# Patient Record
Sex: Male | Born: 1965 | Race: White | Hispanic: No | Marital: Married | Smoking: Former smoker
Health system: Southern US, Community
[De-identification: ages and names within clinical notes are randomized; demographics above are authoritative.]

## PROBLEM LIST (undated history)

## (undated) DIAGNOSIS — I509 Heart failure, unspecified: Secondary | ICD-10-CM

## (undated) DIAGNOSIS — I10 Essential (primary) hypertension: Secondary | ICD-10-CM

## (undated) DIAGNOSIS — G4700 Insomnia, unspecified: Secondary | ICD-10-CM

## (undated) DIAGNOSIS — I429 Cardiomyopathy, unspecified: Secondary | ICD-10-CM

## (undated) DIAGNOSIS — E119 Type 2 diabetes mellitus without complications: Secondary | ICD-10-CM

## (undated) DIAGNOSIS — E7800 Pure hypercholesterolemia, unspecified: Secondary | ICD-10-CM

## (undated) DIAGNOSIS — F329 Major depressive disorder, single episode, unspecified: Secondary | ICD-10-CM

## (undated) DIAGNOSIS — I2510 Atherosclerotic heart disease of native coronary artery without angina pectoris: Secondary | ICD-10-CM

## (undated) DIAGNOSIS — E785 Hyperlipidemia, unspecified: Secondary | ICD-10-CM

## (undated) DIAGNOSIS — I219 Acute myocardial infarction, unspecified: Secondary | ICD-10-CM

## (undated) DIAGNOSIS — F419 Anxiety disorder, unspecified: Secondary | ICD-10-CM

## (undated) DIAGNOSIS — G629 Polyneuropathy, unspecified: Secondary | ICD-10-CM

## (undated) DIAGNOSIS — J449 Chronic obstructive pulmonary disease, unspecified: Secondary | ICD-10-CM

## (undated) DIAGNOSIS — K219 Gastro-esophageal reflux disease without esophagitis: Secondary | ICD-10-CM

## (undated) DIAGNOSIS — F32A Depression, unspecified: Secondary | ICD-10-CM

## (undated) DIAGNOSIS — I4891 Unspecified atrial fibrillation: Secondary | ICD-10-CM

## (undated) HISTORY — DX: Insomnia, unspecified: G47.00

## (undated) HISTORY — PX: NO PAST SURGERIES: SHX2092

## (undated) HISTORY — DX: Depression, unspecified: F32.A

## (undated) HISTORY — DX: Pure hypercholesterolemia, unspecified: E78.00

## (undated) HISTORY — DX: Chronic obstructive pulmonary disease, unspecified: J44.9

## (undated) HISTORY — DX: Anxiety disorder, unspecified: F41.9

## (undated) HISTORY — DX: Polyneuropathy, unspecified: G62.9

## (undated) HISTORY — DX: Major depressive disorder, single episode, unspecified: F32.9

---

## 2007-10-05 ENCOUNTER — Ambulatory Visit: Payer: Self-pay | Admitting: Cardiology

## 2011-08-31 ENCOUNTER — Emergency Department (HOSPITAL_COMMUNITY): Payer: Self-pay

## 2011-08-31 ENCOUNTER — Emergency Department (HOSPITAL_COMMUNITY)
Admission: EM | Admit: 2011-08-31 | Discharge: 2011-08-31 | Disposition: A | Discharge status: Home / Self Care | Payer: Self-pay | Attending: Emergency Medicine | Admitting: Emergency Medicine

## 2011-08-31 ENCOUNTER — Encounter (HOSPITAL_COMMUNITY): Payer: Self-pay

## 2011-08-31 DIAGNOSIS — S93401A Sprain of unspecified ligament of right ankle, initial encounter: Secondary | ICD-10-CM

## 2011-08-31 DIAGNOSIS — S93409A Sprain of unspecified ligament of unspecified ankle, initial encounter: Secondary | ICD-10-CM | POA: Insufficient documentation

## 2011-08-31 DIAGNOSIS — W172XXA Fall into hole, initial encounter: Secondary | ICD-10-CM | POA: Insufficient documentation

## 2011-08-31 DIAGNOSIS — M7989 Other specified soft tissue disorders: Secondary | ICD-10-CM | POA: Insufficient documentation

## 2011-08-31 HISTORY — DX: Essential (primary) hypertension: I10

## 2011-08-31 MED ORDER — IBUPROFEN 800 MG PO TABS
800.0000 mg | ORAL_TABLET | Freq: Once | ORAL | Status: AC
  Administered 2011-08-31: 800 mg via ORAL
  Filled 2011-08-31: qty 1

## 2011-08-31 MED ORDER — HYDROCODONE-ACETAMINOPHEN 5-325 MG PO TABS
1.0000 | ORAL_TABLET | Freq: Once | ORAL | Status: AC
  Administered 2011-08-31: 1 via ORAL
  Filled 2011-08-31: qty 1

## 2011-08-31 MED ORDER — HYDROCODONE-ACETAMINOPHEN 5-325 MG PO TABS: 1.0000 | ORAL_TABLET | Freq: Four times a day (QID) | ORAL | Status: AC | PRN

## 2011-08-31 NOTE — ED Notes (Signed)
Pt reports stepped in hole this morning and c/o r ankle pain.  Swelling noted.

## 2011-08-31 NOTE — ED Provider Notes (Signed)
History     CSN: [id number]  Arrival date & time 08/31/11  1247   First MD Initiated Contact with Patient 08/31/11 1309      Chief Complaint  Patient presents with  . Ankle Pain    (Consider location/radiation/quality/duration/timing/severity/associated sxs/prior treatment) HPI Comments: Pt stepped in a hole and inverted R foot and ankle.  No other injuries or complaints.  Patient is a 46 y.o. male presenting with ankle pain. The history is provided by the patient. No language interpreter was used.  Ankle Pain  The incident occurred 1 to 2 hours ago. The incident occurred at home. The injury mechanism was a fall. The pain is present in the right ankle. The quality of the pain is described as throbbing. The pain is at a severity of 7/10. The pain has been constant since onset. Associated symptoms include inability to bear weight and loss of motion. Pertinent negatives include no numbness, no muscle weakness, no loss of sensation and no tingling. He reports no foreign bodies present. The symptoms are aggravated by bearing weight and palpation. He has tried nothing for the symptoms.    Past Medical History  Diagnosis Date  . Diabetes mellitus   . Hypertension     History reviewed. No pertinent past surgical history.  No family history on file.  History  Substance Use Topics  . Smoking status: Never Smoker   . Smokeless tobacco: Not on file  . Alcohol Use: No      Review of Systems  Skin: Negative for wound.  Neurological: Negative for tingling, weakness and numbness.  All other systems reviewed and are negative.    Allergies  Review of patient's allergies indicates no known allergies.  Home Medications   Current Outpatient Rx  Name Route Sig Dispense Refill  . HYDROCODONE-ACETAMINOPHEN 5-325 MG PO TABS Oral Take 1 tablet by mouth every 6 (six) hours as needed for pain. 20 tablet 0    BP 147/96  Pulse 93  Temp 98.9 F (37.2 C) (Oral)  Resp 20  Ht 5\' 9"   (1.753 m)  Wt 230 lb (104.327 kg)  BMI 33.96 kg/m2  SpO2 97%  Physical Exam  Nursing note and vitals reviewed. Constitutional: He is oriented to person, place, and time. He appears well-developed and well-nourished.  HENT:  Head: Normocephalic and atraumatic.  Eyes: EOM are normal.  Neck: Normal range of motion.  Cardiovascular: Normal rate, regular rhythm, normal heart sounds and intact distal pulses.   Pulmonary/Chest: Effort normal and breath sounds normal. No respiratory distress.  Abdominal: Soft. He exhibits no distension. There is no tenderness.  Musculoskeletal: He exhibits tenderness.       Right ankle: He exhibits decreased range of motion and swelling. He exhibits no ecchymosis, no deformity, no laceration and normal pulse. tenderness. Lateral malleolus tenderness found.  Neurological: He is alert and oriented to person, place, and time.  Skin: Skin is warm and dry.  Psychiatric: He has a normal mood and affect. Judgment normal.    ED Course  Procedures (including critical care time)  Labs Reviewed - No data to display Dg Ankle Complete Right  08/31/2011  *RADIOLOGY REPORT*  Clinical Data: ankle injury.  Ankle pain and swelling.  RIGHT ANKLE - COMPLETE 3+ VIEW  Comparison: None.  Findings: Mild lateral soft tissue swelling is seen.   No evidence of for acute fracture or dislocation.  An ankle joint effusion is seen.  No other significant bone abnormality identified.  IMPRESSION: Mild lateral soft tissue  swelling and ankle joint effusion.  No evidence of fracture or dislocation.  Original Report Authenticated By: Danae Orleans, M.D.     1. Right ankle sprain       MDM  ASO, crutches, ice and elevation. rx-hydrocodone, 20 Ibuprofen 800 mg TID F/u with dr. Stana Bunting, PA 08/31/11 1347

## 2011-08-31 NOTE — ED Notes (Signed)
Patient with no complaints at this time. Respirations even and unlabored. Skin warm/dry. Discharge instructions reviewed with patient at this time. Patient given opportunity to voice concerns/ask questions. Patient discharged at this time and left Emergency Department via wheelchair. 

## 2011-08-31 NOTE — ED Provider Notes (Signed)
Medical screening examination/treatment/procedure(s) were performed by non-physician practitioner and as supervising physician I was immediately available for consultation/collaboration.   Angeletta Goelz L Davionna Blacksher, MD 08/31/11 1512 

## 2013-11-04 ENCOUNTER — Encounter (HOSPITAL_COMMUNITY)
Admission: RE | Disposition: A | Discharge status: Home / Self Care | Payer: Self-pay | Source: Other Acute Inpatient Hospital | Attending: Thoracic Surgery (Cardiothoracic Vascular Surgery)

## 2013-11-04 ENCOUNTER — Encounter (HOSPITAL_COMMUNITY): Payer: Self-pay | Admitting: Cardiology

## 2013-11-04 ENCOUNTER — Inpatient Hospital Stay (HOSPITAL_COMMUNITY)
Admission: RE | Admit: 2013-11-04 | Discharge: 2013-11-15 | DRG: 234 | Disposition: A | Discharge status: Home / Self Care | Payer: Medicaid Other | Source: Other Acute Inpatient Hospital | Attending: Thoracic Surgery (Cardiothoracic Vascular Surgery) | Admitting: Thoracic Surgery (Cardiothoracic Vascular Surgery)

## 2013-11-04 DIAGNOSIS — F1410 Cocaine abuse, uncomplicated: Secondary | ICD-10-CM

## 2013-11-04 DIAGNOSIS — Z8249 Family history of ischemic heart disease and other diseases of the circulatory system: Secondary | ICD-10-CM

## 2013-11-04 DIAGNOSIS — I219 Acute myocardial infarction, unspecified: Secondary | ICD-10-CM

## 2013-11-04 DIAGNOSIS — E785 Hyperlipidemia, unspecified: Secondary | ICD-10-CM

## 2013-11-04 DIAGNOSIS — F17200 Nicotine dependence, unspecified, uncomplicated: Secondary | ICD-10-CM | POA: Diagnosis present

## 2013-11-04 DIAGNOSIS — I2510 Atherosclerotic heart disease of native coronary artery without angina pectoris: Secondary | ICD-10-CM | POA: Diagnosis present

## 2013-11-04 DIAGNOSIS — Y9210 Unspecified residential institution as the place of occurrence of the external cause: Secondary | ICD-10-CM | POA: Diagnosis not present

## 2013-11-04 DIAGNOSIS — D62 Acute posthemorrhagic anemia: Secondary | ICD-10-CM | POA: Diagnosis not present

## 2013-11-04 DIAGNOSIS — Z79899 Other long term (current) drug therapy: Secondary | ICD-10-CM

## 2013-11-04 DIAGNOSIS — I2109 ST elevation (STEMI) myocardial infarction involving other coronary artery of anterior wall: Principal | ICD-10-CM | POA: Diagnosis present

## 2013-11-04 DIAGNOSIS — E669 Obesity, unspecified: Secondary | ICD-10-CM

## 2013-11-04 DIAGNOSIS — N189 Chronic kidney disease, unspecified: Secondary | ICD-10-CM | POA: Diagnosis present

## 2013-11-04 DIAGNOSIS — IMO0001 Reserved for inherently not codable concepts without codable children: Secondary | ICD-10-CM | POA: Diagnosis present

## 2013-11-04 DIAGNOSIS — I10 Essential (primary) hypertension: Secondary | ICD-10-CM

## 2013-11-04 DIAGNOSIS — I129 Hypertensive chronic kidney disease with stage 1 through stage 4 chronic kidney disease, or unspecified chronic kidney disease: Secondary | ICD-10-CM | POA: Diagnosis present

## 2013-11-04 DIAGNOSIS — Z9119 Patient's noncompliance with other medical treatment and regimen: Secondary | ICD-10-CM

## 2013-11-04 DIAGNOSIS — E1159 Type 2 diabetes mellitus with other circulatory complications: Secondary | ICD-10-CM

## 2013-11-04 DIAGNOSIS — I2584 Coronary atherosclerosis due to calcified coronary lesion: Secondary | ICD-10-CM | POA: Diagnosis present

## 2013-11-04 DIAGNOSIS — Z833 Family history of diabetes mellitus: Secondary | ICD-10-CM

## 2013-11-04 DIAGNOSIS — Z91199 Patient's noncompliance with other medical treatment and regimen due to unspecified reason: Secondary | ICD-10-CM | POA: Diagnosis not present

## 2013-11-04 DIAGNOSIS — E8779 Other fluid overload: Secondary | ICD-10-CM | POA: Diagnosis not present

## 2013-11-04 DIAGNOSIS — I519 Heart disease, unspecified: Secondary | ICD-10-CM | POA: Diagnosis not present

## 2013-11-04 DIAGNOSIS — Y832 Surgical operation with anastomosis, bypass or graft as the cause of abnormal reaction of the patient, or of later complication, without mention of misadventure at the time of the procedure: Secondary | ICD-10-CM | POA: Diagnosis not present

## 2013-11-04 DIAGNOSIS — I4891 Unspecified atrial fibrillation: Secondary | ICD-10-CM | POA: Diagnosis not present

## 2013-11-04 DIAGNOSIS — Z951 Presence of aortocoronary bypass graft: Secondary | ICD-10-CM

## 2013-11-04 DIAGNOSIS — J9819 Other pulmonary collapse: Secondary | ICD-10-CM | POA: Diagnosis present

## 2013-11-04 DIAGNOSIS — E1165 Type 2 diabetes mellitus with hyperglycemia: Secondary | ICD-10-CM

## 2013-11-04 DIAGNOSIS — I4892 Unspecified atrial flutter: Secondary | ICD-10-CM | POA: Diagnosis present

## 2013-11-04 DIAGNOSIS — I25119 Atherosclerotic heart disease of native coronary artery with unspecified angina pectoris: Secondary | ICD-10-CM

## 2013-11-04 DIAGNOSIS — K219 Gastro-esophageal reflux disease without esophagitis: Secondary | ICD-10-CM | POA: Diagnosis present

## 2013-11-04 DIAGNOSIS — Z7982 Long term (current) use of aspirin: Secondary | ICD-10-CM

## 2013-11-04 DIAGNOSIS — I213 ST elevation (STEMI) myocardial infarction of unspecified site: Secondary | ICD-10-CM

## 2013-11-04 DIAGNOSIS — I498 Other specified cardiac arrhythmias: Secondary | ICD-10-CM | POA: Diagnosis present

## 2013-11-04 DIAGNOSIS — I25110 Atherosclerotic heart disease of native coronary artery with unstable angina pectoris: Secondary | ICD-10-CM

## 2013-11-04 DIAGNOSIS — E119 Type 2 diabetes mellitus without complications: Secondary | ICD-10-CM

## 2013-11-04 HISTORY — DX: Acute myocardial infarction, unspecified: I21.9

## 2013-11-04 HISTORY — PX: LEFT HEART CATHETERIZATION WITH CORONARY ANGIOGRAM: SHX5451

## 2013-11-04 HISTORY — DX: Gastro-esophageal reflux disease without esophagitis: K21.9

## 2013-11-04 LAB — SURGICAL PCR SCREEN
MRSA, PCR: NEGATIVE
STAPHYLOCOCCUS AUREUS: NEGATIVE

## 2013-11-04 LAB — GLUCOSE, CAPILLARY
GLUCOSE-CAPILLARY: 342 mg/dL — AB (ref 70–99)
Glucose-Capillary: 352 mg/dL — ABNORMAL HIGH (ref 70–99)

## 2013-11-04 LAB — POCT ACTIVATED CLOTTING TIME: Activated Clotting Time: 118 seconds

## 2013-11-04 SURGERY — LEFT HEART CATHETERIZATION WITH CORONARY ANGIOGRAM
Anesthesia: LOCAL

## 2013-11-04 MED ORDER — INSULIN ASPART 100 UNIT/ML ~~LOC~~ SOLN
0.0000 [IU] | Freq: Every day | SUBCUTANEOUS | Status: DC
  Administered 2013-11-04 – 2013-11-05 (×2): 5 [IU] via SUBCUTANEOUS
  Administered 2013-11-07: 2 [IU] via SUBCUTANEOUS

## 2013-11-04 MED ORDER — FENTANYL CITRATE 0.05 MG/ML IJ SOLN
INTRAMUSCULAR | Status: AC
  Filled 2013-11-04: qty 2

## 2013-11-04 MED ORDER — ACETAMINOPHEN 325 MG PO TABS
650.0000 mg | ORAL_TABLET | ORAL | Status: DC | PRN
Start: 2013-11-04 — End: 2013-11-09
  Administered 2013-11-04 – 2013-11-05 (×2): 650 mg via ORAL
  Filled 2013-11-04 (×2): qty 2

## 2013-11-04 MED ORDER — NITROGLYCERIN IN D5W 200-5 MCG/ML-% IV SOLN
INTRAVENOUS | Status: AC
Start: 2013-11-04 — End: 2013-11-04
  Filled 2013-11-04: qty 250

## 2013-11-04 MED ORDER — HEPARIN SODIUM (PORCINE) 1000 UNIT/ML IJ SOLN
INTRAMUSCULAR | Status: AC
  Filled 2013-11-04: qty 1

## 2013-11-04 MED ORDER — AMLODIPINE BESYLATE 5 MG PO TABS
5.0000 mg | ORAL_TABLET | Freq: Every day | ORAL | Status: DC
  Administered 2013-11-04 – 2013-11-08 (×5): 5 mg via ORAL
  Filled 2013-11-04 (×6): qty 1

## 2013-11-04 MED ORDER — ONDANSETRON HCL 4 MG/2ML IJ SOLN: 4.0000 mg | Freq: Four times a day (QID) | INTRAMUSCULAR | Status: DC | PRN

## 2013-11-04 MED ORDER — CETYLPYRIDINIUM CHLORIDE 0.05 % MT LIQD
7.0000 mL | Freq: Two times a day (BID) | OROMUCOSAL | Status: DC
  Administered 2013-11-05 – 2013-11-08 (×7): 7 mL via OROMUCOSAL

## 2013-11-04 MED ORDER — MIDAZOLAM HCL 2 MG/2ML IJ SOLN
INTRAMUSCULAR | Status: AC
  Filled 2013-11-04: qty 2

## 2013-11-04 MED ORDER — ASPIRIN EC 325 MG PO TBEC
325.0000 mg | DELAYED_RELEASE_TABLET | Freq: Every day | ORAL | Status: DC
  Administered 2013-11-04 – 2013-11-08 (×5): 325 mg via ORAL
  Filled 2013-11-04 (×6): qty 1

## 2013-11-04 MED ORDER — NITROGLYCERIN IN D5W 200-5 MCG/ML-% IV SOLN
2.0000 ug/min | INTRAVENOUS | Status: DC
  Administered 2013-11-05 (×2): 80 ug/min via INTRAVENOUS
  Administered 2013-11-06: 10 ug/min via INTRAVENOUS
  Filled 2013-11-04 (×3): qty 250

## 2013-11-04 MED ORDER — NITROGLYCERIN 1 MG/10 ML FOR IR/CATH LAB
INTRA_ARTERIAL | Status: AC
  Filled 2013-11-04: qty 10

## 2013-11-04 MED ORDER — SODIUM CHLORIDE 0.9 % IV SOLN
INTRAVENOUS | Status: DC | PRN
  Administered 2013-11-04 – 2013-11-07 (×2): 1000 mL via INTRAVENOUS

## 2013-11-04 MED ORDER — HEPARIN (PORCINE) IN NACL 2-0.9 UNIT/ML-% IJ SOLN
INTRAMUSCULAR | Status: AC
  Filled 2013-11-04: qty 1000

## 2013-11-04 MED ORDER — ATORVASTATIN CALCIUM 40 MG PO TABS
40.0000 mg | ORAL_TABLET | Freq: Every day | ORAL | Status: DC
  Administered 2013-11-04 – 2013-11-08 (×5): 40 mg via ORAL
  Filled 2013-11-04 (×6): qty 1

## 2013-11-04 MED ORDER — LIDOCAINE HCL (PF) 1 % IJ SOLN
INTRAMUSCULAR | Status: AC
  Filled 2013-11-04: qty 30

## 2013-11-04 MED ORDER — VERAPAMIL HCL 2.5 MG/ML IV SOLN
INTRAVENOUS | Status: AC
  Filled 2013-11-04: qty 2

## 2013-11-04 MED ORDER — HYDRALAZINE HCL 20 MG/ML IJ SOLN
10.0000 mg | Freq: Once | INTRAMUSCULAR | Status: AC
  Administered 2013-11-04: 10 mg via INTRAVENOUS
  Filled 2013-11-04: qty 1

## 2013-11-04 MED ORDER — OXYCODONE-ACETAMINOPHEN 5-325 MG PO TABS
1.0000 | ORAL_TABLET | Freq: Four times a day (QID) | ORAL | Status: DC | PRN
  Administered 2013-11-04 – 2013-11-08 (×5): 1 via ORAL
  Filled 2013-11-04 (×9): qty 1

## 2013-11-04 MED ORDER — PANTOPRAZOLE SODIUM 20 MG PO TBEC
20.0000 mg | DELAYED_RELEASE_TABLET | Freq: Every day | ORAL | Status: DC
  Administered 2013-11-05 – 2013-11-08 (×4): 20 mg via ORAL
  Filled 2013-11-04 (×5): qty 1

## 2013-11-04 MED ORDER — HEPARIN (PORCINE) IN NACL 100-0.45 UNIT/ML-% IJ SOLN
2000.0000 [IU]/h | INTRAMUSCULAR | Status: DC
  Administered 2013-11-05: 1600 [IU]/h via INTRAVENOUS
  Administered 2013-11-05: 1300 [IU]/h via INTRAVENOUS
  Administered 2013-11-06: 1800 [IU]/h via INTRAVENOUS
  Administered 2013-11-06 – 2013-11-08 (×6): 2000 [IU]/h via INTRAVENOUS
  Filled 2013-11-04 (×14): qty 250

## 2013-11-04 MED ORDER — HYDRALAZINE HCL 20 MG/ML IJ SOLN
10.0000 mg | INTRAMUSCULAR | Status: DC | PRN
  Administered 2013-11-05 (×2): 10 mg via INTRAVENOUS
  Filled 2013-11-04 (×2): qty 1

## 2013-11-04 MED ORDER — INSULIN ASPART 100 UNIT/ML ~~LOC~~ SOLN
0.0000 [IU] | Freq: Three times a day (TID) | SUBCUTANEOUS | Status: DC
  Administered 2013-11-05 (×2): 8 [IU] via SUBCUTANEOUS
  Administered 2013-11-05 – 2013-11-06 (×2): 11 [IU] via SUBCUTANEOUS

## 2013-11-04 NOTE — H&P (Addendum)
Chief Complaint: Chest Pain; STEMI Primary Cardiologist: Claiborne Billings ([place])  HPI: The patient is a 48 y/o male with a history of HTN, DM, HLD, obesity, medical noncompliance, polysubstance abuse including tobacco abuse (1ppd) and recent cocaine use, who presents to [facility] via EMS from [facility] for treatment for STEMI. Pt reports using cocaine 1 day ago. He developed sudden onset of SSCP today around 5am. Occurred at rest and was constant throughout the day. Described as heavy pressure. Non radiating. No other associated symptoms.   On arrival to [facility], EKG demonstrated ST elevations. Troponin returned positive at 2.03. SBP was elevated in the 180s-190s. UDS tested positive for cocaine. SCr 0.74.   He was given 4,000 units of heparin followed by a drip, 81 mg of ASA, SL NTG x 3, 10 mg IV morphine and 5 mg IV metoprolol. On arrival to [facility], he was A&OX3 with ongoing 4/10 chest pain.     Past Medical History  Diagnosis Date  . Diabetes mellitus   . Hypertension     No past surgical history on file.  Family History  Problem Relation Age of Onset  . Hypertension Paternal Grandfather    Social History:  reports that he has been smoking Cigarettes.  He has been smoking about 1.00 pack per day. He does not have any smokeless tobacco history on file. He reports that he uses illicit drugs. He reports that he does not drink alcohol.  Allergies: No Known Allergies  Prior to Admission medications   Not on File     No prescriptions prior to admission    No results found for this or any previous visit (from the past 48 hour(s)). No results found.  Review of Systems  Respiratory: Negative for shortness of breath.   Cardiovascular: Positive for chest pain.  Gastrointestinal: Negative for nausea and vomiting.  Neurological: Negative for dizziness and loss of consciousness.  All other systems reviewed and are negative.   There were no vitals taken for this visit. Physical Exam    Constitutional: He is oriented to person, place, and time. He appears well-developed and well-nourished. No distress.  Neck: No JVD present.  Cardiovascular: Normal rate, regular rhythm, normal heart sounds and intact distal pulses.  Exam reveals no gallop and no friction rub.   No murmur heard. Respiratory: Effort normal and breath sounds normal.  Musculoskeletal: He exhibits no edema.  Neurological: He is alert and oriented to person, place, and time.  Skin: Skin is warm and dry. He is not diaphoretic.  Psychiatric: He has a normal mood and affect. His behavior is normal.     Assessment/Plan Active Problems:   STEMI (ST elevation myocardial infarction)   HTN (hypertension)   DM (diabetes mellitus)   HLD (hyperlipidemia)   Obesity   Cocaine abuse   H/O noncompliance with medical treatment, presenting hazards to health   1. STEMI: emergent LHC revealed severe 3 vessel disease. Will place surgical consult for consideration for CABG. Continue IV heparin.   2. HTN: BB, ACE, nitrates  3. HLD: high dose statin, 80 mg of lipitor. Check fasting lipid panel in the am.   4. DM: Will check a Hgb A1c. Follow blood glucose levels. SSI if needed.    SIMMONS, BRITTAINY 11/04/2013, 5:03 PM   Patient seen and examined. Agree with assessment and plan.  Patient is a 48 year old with a history of hypertension, diabetes mellitus, marked hyperlipidemia, and tobacco use.  He states that remotely he use cocaine use ~ 9  years ago.  However, yesterday he did use cocaine.  Today, he developed significant substernal chest pressure, which persisted throughout the day.  He was transported EMS from [facility] after an ECG demonstrated anterolateral ST elevation.  He is taken emergently to the cardiac catheterization laboratory for acute catheterization.  This revealed significant multivessel CAD.  Surgical consultation will be obtained for consideration of CABG revascularization surgery.    Troy Sine, MD, [facility] 11/04/2013 7:48 PM

## 2013-11-04 NOTE — CV Procedure (Signed)
Shane Frye is a 48 y.o. male   [id number]  [id number] LOCATION:  FACILITY: [place]  PHYSICIAN: Troy Sine, MD, [facility] September 13, 1965   DATE OF PROCEDURE:  11/04/2013     EMERGENT CARDIAC CATHETERIZATION    HISTORY: Shane Frye is a 48 year old white male with a history of significant obesity, hypertension, marked, hyperlipidemia, medical noncompliance, and tobacco use.  He developed severe chest pain today.  Of note, the patient did experience with cocaine yesterday.  He was transported by EMS from [facility] with possible ST segment elevation myocardial infarction anterolaterally.   PROCEDURE:  Left heart catheterization via the right radial approach, coronary angiography, left ventriculography.  The patient was brought to the second floor [facility] Cardiac cath lab by EMS with chest discomfort still present per The patient was premedicated with Versed 2mg  and fentanyl 50 mcg. A right radial approach was utilized after an Allen's test verified adequate circulation. The right radial artery was punctured via the Seldinger technique, and a 6 [place] Glidesheath Slender was inserted without difficulty.  A radial cocktail consisting of Verapamil, IV nitroglycerin, and lidocaine was administered. Weight adjusted heparin was administered. A safety J wire was advanced into the ascending aorta. Diagnostic catheterization was done with 5 [place] TIG 4.0 and JL 3.5 catheters.  IC nitroglycerin was administered.  During the procedure, the patient did require another radial cocktail in addition to intra-arterial nitroglycerin via the sheath due to spasm of his radial artery. A 5 French pigtail catheter was used for left ventriculography. A TR radial band was applied for hemostasis. The patient left the catheterization laboratory chest pain free in stable condition.   HEMODYNAMICS:   Central Aorta: 160/99  Left Ventricle: 160/20  ANGIOGRAPHY:   The left main coronary artery was  angiographically normal which trifurcated into the LAD, ramus intermediate and left circumflex coronary artery.   The LAD had an 80% hazy eccentric proximal stenosis before giving rise to the first diagonal vessel.  The first diagonal vessel, an 80% ostial stenosis.  The mid LAD had diffuse 80% stenosis.  The distal ileum extending to the apex.  Ramus intermediate vessel was angiographically normal vessel.  It was small caliber per  The left circumflex coronary artery had 50% proximal stenosis in the region of the takeoff of the first marginal vessel.  There was 80% ostial stenosis of the first marginal.  The second marginal vessel had 90% proximal stenosis.  The RCA was a large, dominant vessel that had moderate calcification.  There was diffuse 95% mid PDA stenosis with reduced flow beyond this stenosis and also with occlusion of a branch which arose beyond this 95% stenosis.  The distal continuation branch of the RCA had 90% stenosis in the region of the take off of the PLA vessel.  It was an additional 70% stenosis in the most distal PLA 2 vessel.  Left ventriculography revealed mild LV dysfunction with an ejection fraction of approximately 45- 50%.  There was hypokinesis of the distal anterolateral wall apex, extending to the distal inferior wall.   IMPRESSION:  Severe three-vessel coronary disease with evidence for coronary calcification.  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  Stenosis.   RECOMMENDATION:  At the end of the procedure, the patient was entirely  pain-free.  Due to his significant multivessel CAD, surgical consultation will be obtained for consideration for CABG revascularization surgery.    Troy Sine, MD, [facility] 11/04/2013 7:53 PM

## 2013-11-04 NOTE — Progress Notes (Signed)
Dr. Elias Else paged, aware of continued hypertension, most recent BP 169/105, NTG gtt rate 41mcg/min, patient complaining of headache between 8-10 out of 10, face reddened, flushed. Patient states his head is "pounding." Dr. Elias Else aware oxycodone last administered per PRN order at 2130, patient states little relief was felt. Orders received, will continue to assess and monitor patient closely.

## 2013-11-04 NOTE — Progress Notes (Signed)
Dr. Elias Else called, made aware of continued elevated BP, 170s/100s, aware of NTG infusion rate and Norvasc and hydralazine recently given per order as documented on MAR. Aware of patient complains of headache 10/10 and chest pain 3/10, aware Tylenol has already been given per PRN order without relief. Also aware of blood glucose >300. Orders received. Will continue to assess and monitor patient closely.

## 2013-11-04 NOTE — Progress Notes (Signed)
eLink Physician-Brief Progress Note Patient Name: Shane Frye DOB: September 19, 1965 MRN: [id number]   Date of Service  11/04/2013  HPI/Events of Note  Pt adm with STEMI s/p cocaine.  S/p cath, needs TCTS consult. Pt appears stable  eICU Interventions  Per cardiology     Intervention Category Evaluation Type: New Patient Evaluation  Asencion Noble 11/04/2013, 6:51 PM

## 2013-11-04 NOTE — Progress Notes (Signed)
ANTICOAGULATION CONSULT NOTE - Initial Consult  Pharmacy Consult for Heparin Indication: chest pain/ACS  No Known Allergies  Patient Measurements:   Actual Body Weight: 104.7 kg Ideal Body Weight: 70.5 kg Heparin Dosing Weight: 93 kg  Vital Signs: BP: 175/125 mmHg (09/18 1800) Pulse Rate: 67 (09/18 1800)  Labs: No results found for this basename: HGB, HCT, PLT, APTT, LABPROT, INR, HEPARINUNFRC, CREATININE, CKTOTAL, CKMB, TROPONINI,  in the last 72 hours  CrCl is unknown because no creatinine reading has been taken.   Medical History: Past Medical History  Diagnosis Date  . Diabetes mellitus   . Hypertension     Medications:  No prescriptions prior to admission   Scheduled:  . aspirin EC  325 mg Oral Daily   Infusions:  . nitroGLYCERIN 45 mcg/min (11/04/13 1810)    Assessment: 48yo male presents for treatment for STEMI from [facility]. He was given 4000 units of heparin followed by a drip of undocumented rate in chart prior to transfer for cath. Pharmacy is consulted to dose heparin 6 hours after sheath removal. Sheath was removed at 1740.  Goal of Therapy:  Heparin level 0.3-0.7 units/ml Monitor platelets by anticoagulation protocol: Yes   Plan:  Start heparin infusion at 1300 units/hr Check anti-Xa level in 6 hours and daily while on heparin Continue to monitor H&H and platelets  Andrey Cota. Diona Foley, PharmD Clinical Pharmacist Pager [phone] 11/04/2013,6:29 PM

## 2013-11-05 ENCOUNTER — Encounter (HOSPITAL_COMMUNITY): Payer: Self-pay | Admitting: *Deleted

## 2013-11-05 DIAGNOSIS — E1159 Type 2 diabetes mellitus with other circulatory complications: Secondary | ICD-10-CM

## 2013-11-05 DIAGNOSIS — I2510 Atherosclerotic heart disease of native coronary artery without angina pectoris: Secondary | ICD-10-CM

## 2013-11-05 DIAGNOSIS — I200 Unstable angina: Secondary | ICD-10-CM

## 2013-11-05 LAB — HEMOGLOBIN A1C
HEMOGLOBIN A1C: 9.3 % — AB (ref <5.7)
MEAN PLASMA GLUCOSE: 220 mg/dL — AB (ref <117)

## 2013-11-05 LAB — HEPARIN LEVEL (UNFRACTIONATED)
HEPARIN UNFRACTIONATED: 0.12 [IU]/mL — AB (ref 0.30–0.70)
Heparin Unfractionated: 0.37 IU/mL (ref 0.30–0.70)

## 2013-11-05 LAB — CBC
HCT: 43.3 % (ref 39.0–52.0)
Hemoglobin: 15 g/dL (ref 13.0–17.0)
MCH: 30.1 pg (ref 26.0–34.0)
MCHC: 34.6 g/dL (ref 30.0–36.0)
MCV: 86.8 fL (ref 78.0–100.0)
Platelets: 243 10*3/uL (ref 150–400)
RBC: 4.99 MIL/uL (ref 4.22–5.81)
RDW: 13.4 % (ref 11.5–15.5)
WBC: 14 10*3/uL — ABNORMAL HIGH (ref 4.0–10.5)

## 2013-11-05 LAB — GLUCOSE, CAPILLARY
GLUCOSE-CAPILLARY: 268 mg/dL — AB (ref 70–99)
GLUCOSE-CAPILLARY: 353 mg/dL — AB (ref 70–99)
Glucose-Capillary: 301 mg/dL — ABNORMAL HIGH (ref 70–99)
Glucose-Capillary: 286 mg/dL — ABNORMAL HIGH (ref 70–99)

## 2013-11-05 LAB — BASIC METABOLIC PANEL
Anion gap: 17 — ABNORMAL HIGH (ref 5–15)
BUN: 9 mg/dL (ref 6–23)
CHLORIDE: 96 meq/L (ref 96–112)
CO2: 23 mEq/L (ref 19–32)
Calcium: 8.3 mg/dL — ABNORMAL LOW (ref 8.4–10.5)
Creatinine, Ser: 0.67 mg/dL (ref 0.50–1.35)
GFR calc non Af Amer: >90 mL/min (ref >90)
Glucose, Bld: 244 mg/dL — ABNORMAL HIGH (ref 70–99)
POTASSIUM: 4.1 meq/L (ref 3.7–5.3)
Sodium: 136 mEq/L — ABNORMAL LOW (ref 137–147)

## 2013-11-05 LAB — TROPONIN I
TROPONIN I: 7.61 ng/mL — AB (ref <0.30)
TROPONIN I: 5.51 ng/mL — AB (ref <0.30)
Troponin I: 7.42 ng/mL (ref <0.30)

## 2013-11-05 MED ORDER — CARVEDILOL 3.125 MG PO TABS
3.1250 mg | ORAL_TABLET | Freq: Two times a day (BID) | ORAL | Status: DC
Start: 2013-11-05 — End: 2013-11-09
  Administered 2013-11-05 – 2013-11-08 (×7): 3.125 mg via ORAL
  Filled 2013-11-05 (×10): qty 1

## 2013-11-05 MED ORDER — HEPARIN BOLUS VIA INFUSION
2500.0000 [IU] | Freq: Once | INTRAVENOUS | Status: AC
  Administered 2013-11-05: 2500 [IU] via INTRAVENOUS
  Filled 2013-11-05: qty 2500

## 2013-11-05 NOTE — Progress Notes (Signed)
PROGRESS NOTE  Subjective:   Pt was admitted with HTN, severe, CP,  ST elevation. At cath he was found to have severe 3 V CAD UDS was Positive for cocaine   Objective:    Vital Signs:   Temp:  [98 F (36.7 C)-98.9 F (37.2 C)] 98.2 F (36.8 C) (09/19 0724) Pulse Rate:  [65-113] 102 (09/19 0800) Resp:  [11-33] 19 (09/19 0800) BP: (124-215)/(65-159) 150/93 mmHg (09/19 0800) SpO2:  [89 %-99 %] 96 % (09/19 0800) Weight:  [229 lb 12.8 oz (104.237 kg)-230 lb 13.2 oz (104.7 kg)] 229 lb 12.8 oz (104.237 kg) (09/19 0345)  Last BM Date: 11/04/13   24-hour weight change: Weight change:   Weight trends: Filed Weights   11/04/13 1800 11/05/13 0345  Weight: 230 lb 13.2 oz (104.7 kg) 229 lb 12.8 oz (104.237 kg)    Intake/Output:  09/18 0701 - 09/19 0700 In: 1902.3 [P.O.:1430; I.V.:472.3] Out: 1950 [Urine:1950] Total I/O In: 47 [I.V.:47] Out: 525 [Urine:525]   Physical Exam: BP 150/93  Pulse 102  Temp(Src) 98.2 F (36.8 C) (Oral)  Resp 19  Ht 5\' 9"  (1.753 m)  Wt 229 lb 12.8 oz (104.237 kg)  BMI 33.92 kg/m2  SpO2 96%  Wt Readings from Last 3 Encounters:  11/05/13 229 lb 12.8 oz (104.237 kg)  11/05/13 229 lb 12.8 oz (104.237 kg)  08/31/11 230 lb (104.327 kg)    General: Vital signs reviewed and noted.   Head: Normocephalic, atraumatic.  Eyes: conjunctivae/corneas clear.  EOM's intact.   Throat: normal  Neck:  normal   Lungs:    clear  Heart:  RR, normal s1s2, tachycardia   Abdomen:  Soft, non-tender, non-distended    Extremities: No edema,     cath site is ok  Neurologic: A&O X3, CN II - XII are grossly intact.   Psych: Normal     Labs: BMET:  Recent Labs  11/05/13 0318  NA 136*  K 4.1  CL 96  CO2 23  GLUCOSE 244*  BUN 9  CREATININE 0.67  CALCIUM 8.3*    Liver function tests: No results found for this basename: AST, ALT, ALKPHOS, BILITOT, PROT, ALBUMIN,  in the last 72 hours No results found for this basename: LIPASE, AMYLASE,  in the  last 72 hours  CBC:  Recent Labs  11/05/13 0318  WBC 14.0*  HGB 15.0  HCT 43.3  MCV 86.8  PLT 243    Cardiac Enzymes: No results found for this basename: CKTOTAL, CKMB, TROPONINI,  in the last 72 hours  Coagulation Studies: No results found for this basename: LABPROT, INR,  in the last 72 hours  Other: No components found with this basename: POCBNP,  No results found for this basename: DDIMER,  in the last 72 hours  Recent Labs  11/04/13 1730  HGBA1C 9.3*   No results found for this basename: CHOL, HDL, LDLCALC, TRIG, CHOLHDL,  in the last 72 hours No results found for this basename: TSH, T4TOTAL, FREET3, T3FREE, THYROIDAB,  in the last 72 hours No results found for this basename: VITAMINB12, FOLATE, FERRITIN, TIBC, IRON, RETICCTPCT,  in the last 72 hours   Other results:  EKG : from EMS  NSR, TWI in inf. And lateral leads.  Repeat ECG has been ordered.     Medications:    Infusions: . heparin 1,300 Units/hr (11/05/13 0021)  . nitroGLYCERIN 80 mcg/min (11/05/13 0502)    Scheduled Medications: . amLODipine  5 mg Oral Daily  .  antiseptic oral rinse  7 mL Mouth Rinse BID  . aspirin EC  325 mg Oral Daily  . atorvastatin  40 mg Oral Daily  . insulin aspart  0-15 Units Subcutaneous TID WC  . insulin aspart  0-5 Units Subcutaneous QHS  . pantoprazole  20 mg Oral Daily    Assessment/ Plan:   Active Problems:   STEMI (ST elevation myocardial infarction)   HTN (hypertension)   DM (diabetes mellitus)   HLD (hyperlipidemia)   Obesity   Cocaine abuse   H/O noncompliance with medical treatment, presenting hazards to health   CAD (coronary artery disease), native coronary artery  1. CAD:  Severe 3 V CAD.  TCTS has been paged.    2. HTN:   Significant HTN -  He has  Recent cocaine intake.  I have called pharmacy to see if it is safe to start coreg.  I suspect we can start it today.  3. DM.  HbA1C is > 9.  He is on sliding scale insulin.   Disposition: for  surgical consult Length of Stay: 1  Thayer Headings, Brooke Bonito., MD, [facility] 11/05/2013, 9:28 AM Office [phone] Pager [phone]

## 2013-11-05 NOTE — Progress Notes (Signed)
CRITICAL VALUE ALERT  Critical value received:  troponin 7.42     Date of notification:  11/05/2013  Time of notification:  2162  Critical value read back: yes  Nurse who received alert:  Lolita Lenz, RN  MD notified (1st page):  Thayer Headings, Brooke Bonito., MD, [facility] Pager [phone]  Time of first page:  1156  MD notified (2nd page):  Time of second page:  Responding MD:  Ramond Dial., MD, [facility]   Time MD responded:  [phone]

## 2013-11-05 NOTE — Progress Notes (Signed)
ANTICOAGULATION CONSULT NOTE - Initial Consult  Pharmacy Consult for Heparin Indication: chest pain/ACS  No Known Allergies  Patient Measurements: Height: 5\' 9"  (175.3 cm) Weight: 229 lb 12.8 oz (104.237 kg) IBW/kg (Calculated) : 70.7 Actual Body Weight: 104.7 kg Ideal Body Weight: 70.5 kg Heparin Dosing Weight: 93 kg  Vital Signs: Temp: 98.2 F (36.8 C) (09/19 0724) Temp src: Oral (09/19 0724) BP: 150/93 mmHg (09/19 0800) Pulse Rate: 102 (09/19 0800)  Labs:  Recent Labs  11/05/13 0318 11/05/13 0919  HGB 15.0  --   HCT 43.3  --   PLT 243  --   HEPARINUNFRC  --  0.12*  CREATININE 0.67  --     Estimated Creatinine Clearance: 131.4 ml/min (by C-G formula based on Cr of 0.67).   Medical History: Past Medical History  Diagnosis Date  . Diabetes mellitus   . Hypertension   . GERD (gastroesophageal reflux disease)     Medications:  Prescriptions prior to admission  Medication Sig Dispense Refill  . Menthol, Topical Analgesic, (FLEXALL MAXIMUM STRENGTH EX) Apply 1 application topically daily.       Marland Kitchen atorvastatin (LIPITOR) 10 MG tablet Take 10 mg by mouth daily.      . carvedilol (COREG) 12.5 MG tablet Take 12.5 mg by mouth 2 (two) times daily with a meal.      . Choline Fenofibrate (TRILIPIX) 45 MG capsule Take 45 mg by mouth daily.      Marland Kitchen gabapentin (NEURONTIN) 300 MG capsule Take 300 mg by mouth at bedtime.       . metFORMIN (GLUCOPHAGE) 1000 MG tablet Take 1,000 mg by mouth 2 (two) times daily with a meal.      . pantoprazole (PROTONIX) 20 MG tablet Take 20 mg by mouth daily.      . quinapril-hydrochlorothiazide (ACCURETIC) 20-25 MG per tablet Take 1 tablet by mouth 2 (two) times daily.       Scheduled:  . amLODipine  5 mg Oral Daily  . antiseptic oral rinse  7 mL Mouth Rinse BID  . aspirin EC  325 mg Oral Daily  . atorvastatin  40 mg Oral Daily  . carvedilol  3.125 mg Oral BID WC  . heparin  2,500 Units Intravenous Once  . insulin aspart  0-15 Units  Subcutaneous TID WC  . insulin aspart  0-5 Units Subcutaneous QHS  . pantoprazole  20 mg Oral Daily   Infusions:  . heparin 1,300 Units/hr (11/05/13 0021)  . nitroGLYCERIN 80 mcg/min (11/05/13 0502)    Assessment: 48yo male presents for treatment of STEMI from [facility]. In cath found to have severe 3V disease, consulted for CABG. He was given 4000 units of heparin followed by a drip of undocumented rate in chart prior to transfer for cath. Pharmacy is consulted to dose heparin 6 hours after sheath removal. Initial HL today is SUBtherapeutic at 0.12. Per nurse, no interruptions in gtt and no s/s bleeding. H/H, plt wnl and stable.   Goal of Therapy:  Heparin level 0.3-0.7 units/ml Monitor platelets by anticoagulation protocol: Yes   Plan:  - Re-bolus heparin with 2500 units x 1  - Increase hep gtt to 1600 u/hr - Check anti-Xa level in 6 hours and daily while on heparin - Continue to monitor H&H and platelets - F/u plans for CABG  Harolyn Rutherford, PharmD Clinical Pharmacist - Resident Pager: [phone] Pharmacy: [phone] 11/05/2013 10:27 AM

## 2013-11-05 NOTE — Progress Notes (Signed)
ANTICOAGULATION CONSULT NOTE - Initial Consult  Pharmacy Consult for Heparin Indication: chest pain/ACS  No Known Allergies  Patient Measurements: Height: 5\' 9"  (175.3 cm) Weight: 229 lb 12.8 oz (104.237 kg) IBW/kg (Calculated) : 70.7 Actual Body Weight: 104.7 kg Ideal Body Weight: 70.5 kg Heparin Dosing Weight: 93 kg  Vital Signs: Temp: 98.5 F (36.9 C) (09/19 1608) Temp src: Oral (09/19 1608) BP: 160/98 mmHg (09/19 1700) Pulse Rate: 117 (09/19 1700)  Labs:  Recent Labs  11/05/13 0318 11/05/13 0919 11/05/13 1015 11/05/13 1600 11/05/13 1651  HGB 15.0  --   --   --   --   HCT 43.3  --   --   --   --   PLT 243  --   --   --   --   HEPARINUNFRC  --  0.12*  --   --  0.37  CREATININE 0.67  --   --   --   --   TROPONINI  --   --  7.42* 7.61*  --     Estimated Creatinine Clearance: 131.4 ml/min (by C-G formula based on Cr of 0.67).   Medical History: Past Medical History  Diagnosis Date  . Diabetes mellitus   . Hypertension   . GERD (gastroesophageal reflux disease)     Medications:  Prescriptions prior to admission  Medication Sig Dispense Refill  . Menthol, Topical Analgesic, (FLEXALL MAXIMUM STRENGTH EX) Apply 1 application topically daily.       Marland Kitchen atorvastatin (LIPITOR) 10 MG tablet Take 10 mg by mouth daily.      . carvedilol (COREG) 12.5 MG tablet Take 12.5 mg by mouth 2 (two) times daily with a meal.      . Choline Fenofibrate (TRILIPIX) 45 MG capsule Take 45 mg by mouth daily.      Marland Kitchen gabapentin (NEURONTIN) 300 MG capsule Take 300 mg by mouth at bedtime.       . metFORMIN (GLUCOPHAGE) 1000 MG tablet Take 1,000 mg by mouth 2 (two) times daily with a meal.      . pantoprazole (PROTONIX) 20 MG tablet Take 20 mg by mouth daily.      . quinapril-hydrochlorothiazide (ACCURETIC) 20-25 MG per tablet Take 1 tablet by mouth 2 (two) times daily.       Scheduled:  . amLODipine  5 mg Oral Daily  . antiseptic oral rinse  7 mL Mouth Rinse BID  . aspirin EC  325 mg  Oral Daily  . atorvastatin  40 mg Oral Daily  . carvedilol  3.125 mg Oral BID WC  . insulin aspart  0-15 Units Subcutaneous TID WC  . insulin aspart  0-5 Units Subcutaneous QHS  . pantoprazole  20 mg Oral Daily   Infusions:  . heparin 1,600 Units/hr (11/05/13 1520)  . nitroGLYCERIN 80 mcg/min (11/05/13 1520)    Assessment: 48yo male presents for treatment of STEMI from [facility]. In cath found to have severe 3V disease, consulted for CABG. He was given 4000 units of heparin followed by a drip of undocumented rate in chart prior to transfer for cath. Heparin is therapeutic this PM   Goal of Therapy:  Heparin level 0.3-0.7 units/ml Monitor platelets by anticoagulation protocol: Yes   Plan:   Cont heparin at 1600 units Check confirm level in 6 hr  Onnie Boer, PharmD Pager: [phone] 11/05/2013 5:52 PM

## 2013-11-05 NOTE — Consult Note (Signed)
Reason for Consult:3 vessel CAD s/p STEMI Referring Physician: Dr. Leida Lauth Shane Frye is an 48 y.o. male.  HPI: 49 yo man presented with a cc/o chest pain  Shane Frye is a 48 yo man with a PMH significant for hypertension, type II diabetes, obesity, tobacco abuse, a strong family history of premature CAD and medical noncompliance. He was awakened yesterday AM at ~ 5 AM with sudden onset of substernal chest pain. He describes it as a severe pressure. He did not feel SOB and did not have diaphoresis. He had been using cocaine the evening before. He went to the ED at [facility] and was found to have hypertension and ST elevation. He ruled in for a STEMI with a troponin of 2.03. He was transferred to [facility] and taken directly to the cath lab. He was still having pain on arrival but it resolved during cath with NTG. Cath showed sevre diffuse 3 vessel CAD. He is currently pain free.   He has type II DM and takes metformin, but does not check his blood sugar regularly at home. He is actively smoking, he says < 1 ppd.   Past Medical History  Diagnosis Date  . Diabetes mellitus   . Hypertension   . GERD (gastroesophageal reflux disease)     Past Surgical History  Procedure Laterality Date  . No past surgeries      Family History  Problem Relation Age of Onset  . Hypertension Paternal Grandfather   . Hypertension Brother   . Diabetes Brother   CAD- brother died of MI at 82, father has CAD  Social History:  reports that he has been smoking Cigarettes.  He has been smoking about 0.50 packs per day. He has never used smokeless tobacco. He reports that he drinks about 1.2 ounces of alcohol per week. He reports that he uses illicit drugs.  Allergies: No Known Allergies  Medications:  Prior to Admission:  Prescriptions prior to admission  Medication Sig Dispense Refill  . Menthol, Topical Analgesic, (FLEXALL MAXIMUM STRENGTH EX) Apply 1 application topically daily.       Marland Kitchen atorvastatin  (LIPITOR) 10 MG tablet Take 10 mg by mouth daily.      . carvedilol (COREG) 12.5 MG tablet Take 12.5 mg by mouth 2 (two) times daily with a meal.      . Choline Fenofibrate (TRILIPIX) 45 MG capsule Take 45 mg by mouth daily.      Marland Kitchen gabapentin (NEURONTIN) 300 MG capsule Take 300 mg by mouth at bedtime.       . metFORMIN (GLUCOPHAGE) 1000 MG tablet Take 1,000 mg by mouth 2 (two) times daily with a meal.      . pantoprazole (PROTONIX) 20 MG tablet Take 20 mg by mouth daily.      . quinapril-hydrochlorothiazide (ACCURETIC) 20-25 MG per tablet Take 1 tablet by mouth 2 (two) times daily.        Results for orders placed during the hospital encounter of 11/04/13 (from the past 48 hour(s))  POCT ACTIVATED CLOTTING TIME     Status: None   Collection Time    11/04/13  5:08 PM      Result Value Ref Range   Activated Clotting Time 118    HEMOGLOBIN A1C     Status: Abnormal   Collection Time    11/04/13  5:30 PM      Result Value Ref Range   Hemoglobin A1C 9.3 (*) <5.7 %   Comment: (NOTE)  According to the ADA Clinical Practice Recommendations for 2011, when     HbA1c is used as a screening test:      >=6.5%   Diagnostic of Diabetes Mellitus               (if abnormal result is confirmed)     5.7-6.4%   Increased risk of developing Diabetes Mellitus     References:Diagnosis and Classification of Diabetes Mellitus,Diabetes     TGYB,6389,37(DSKAJ 1):S62-S69 and Standards of Medical Care in             Diabetes - 2011,Diabetes GOTL,5726,20 (Suppl 1):S11-S61.   Mean Plasma Glucose 220 (*) <117 mg/dL   Comment: Performed at [facility]     Status: None   Collection Time    11/04/13  6:12 PM      Result Value Ref Range   MRSA, PCR NEGATIVE  NEGATIVE   Staphylococcus aureus NEGATIVE  NEGATIVE   Comment:            The Xpert SA Assay (FDA     approved for NASAL specimens     in patients over  8 years of age),     is one component of     a comprehensive surveillance     program.  Test performance has     been validated by [employer] for patients greater     than or equal to 105 year old.     It is not intended     to diagnose infection nor to     guide or monitor treatment.  GLUCOSE, CAPILLARY     Status: Abnormal   Collection Time    11/04/13  8:10 PM      Result Value Ref Range   Glucose-Capillary 342 (*) 70 - 99 mg/dL   Comment 1 Capillary Sample    GLUCOSE, CAPILLARY     Status: Abnormal   Collection Time    11/04/13 10:27 PM      Result Value Ref Range   Glucose-Capillary 352 (*) 70 - 99 mg/dL   Comment 1 Capillary Sample    CBC     Status: Abnormal   Collection Time    11/05/13  3:18 AM      Result Value Ref Range   WBC 14.0 (*) 4.0 - 10.5 K/uL   RBC 4.99  4.22 - 5.81 MIL/uL   Hemoglobin 15.0  13.0 - 17.0 g/dL   HCT 43.3  39.0 - 52.0 %   MCV 86.8  78.0 - 100.0 fL   MCH 30.1  26.0 - 34.0 pg   MCHC 34.6  30.0 - 36.0 g/dL   RDW 13.4  11.5 - 15.5 %   Platelets 243  150 - 400 K/uL  BASIC METABOLIC PANEL     Status: Abnormal   Collection Time    11/05/13  3:18 AM      Result Value Ref Range   Sodium 136 (*) 137 - 147 mEq/L   Potassium 4.1  3.7 - 5.3 mEq/L   Chloride 96  96 - 112 mEq/L   CO2 23  19 - 32 mEq/L   Glucose, Bld 244 (*) 70 - 99 mg/dL   BUN 9  6 - 23 mg/dL   Creatinine, Ser 0.67  0.50 - 1.35 mg/dL   Calcium 8.3 (*) 8.4 - 10.5 mg/dL   GFR calc non Af Amer >90  >90 mL/min   GFR calc  Af Amer >90  >90 mL/min   Comment: (NOTE)     The eGFR has been calculated using the CKD EPI equation.     This calculation has not been validated in all clinical situations.     eGFR's persistently <90 mL/min signify possible Chronic Kidney     Disease.   Anion gap 17 (*) 5 - 15  GLUCOSE, CAPILLARY     Status: Abnormal   Collection Time    11/05/13  7:27 AM      Result Value Ref Range   Glucose-Capillary 268 (*) 70 - 99 mg/dL   Comment 1 Capillary Sample     HEPARIN LEVEL (UNFRACTIONATED)     Status: Abnormal   Collection Time    11/05/13  9:19 AM      Result Value Ref Range   Heparin Unfractionated 0.12 (*) 0.30 - 0.70 IU/mL   Comment:            IF HEPARIN RESULTS ARE BELOW     EXPECTED VALUES, AND PATIENT     DOSAGE HAS BEEN CONFIRMED,     SUGGEST FOLLOW UP TESTING     OF ANTITHROMBIN III LEVELS.    No results found.  Review of Systems  Constitutional: Positive for malaise/fatigue. Negative for fever and diaphoresis.  Respiratory: Negative for shortness of breath.   Cardiovascular: Positive for chest pain.  Musculoskeletal: Positive for myalgias.  All other systems reviewed and are negative.  Blood pressure 150/93, pulse 102, temperature 98.2 F (36.8 C), temperature source Oral, resp. rate 19, height _0  (1.753 m), weight 229 lb 12.8 oz (104.237 kg), SpO2 96.00%. Physical Exam  Vitals reviewed. Constitutional: He is oriented to person, place, and time. He appears well-developed. No distress.  obese  HENT:  Head: Normocephalic and atraumatic.  Eyes: EOM are normal. Pupils are equal, round, and reactive to light.  Neck: Neck supple. No thyromegaly present.  No carotid bruits  Cardiovascular: Regular rhythm.   No murmur heard. tachycardic  Respiratory: Effort normal and breath sounds normal.  GI: Soft. There is no tenderness.  Musculoskeletal: He exhibits no edema.  Lymphadenopathy:    He has no cervical adenopathy.  Neurological: He is alert and oriented to person, place, and time. No cranial nerve deficit.  No focal motor deficit  Skin: Skin is warm and dry.   ANGIOGRAPHY:  The left main coronary artery was angiographically normal which trifurcated into the LAD, ramus intermediate and left circumflex coronary artery.  The LAD had an 80% hazy eccentric proximal stenosis before giving rise to the first diagonal vessel. The first diagonal vessel, an 80% ostial stenosis. The mid LAD had diffuse 80% stenosis. The distal  ileum extending to the apex.  Ramus intermediate vessel was angiographically normal vessel. It was small caliber per  The left circumflex coronary artery had 50% proximal stenosis in the region of the takeoff of the first marginal vessel. There was 80% ostial stenosis of the first marginal. The second marginal vessel had 90% proximal stenosis.  The RCA was a large, dominant vessel that had moderate calcification. There was diffuse 95% mid PDA stenosis with reduced flow beyond this stenosis and also with occlusion of a branch which arose beyond this 95% stenosis. The distal continuation branch of the RCA had 90% stenosis in the region of the take off of the PLA vessel. It was an additional 70% stenosis in the most distal PLA 2 vessel.  Left ventriculography revealed mild LV dysfunction with an ejection fraction  of approximately 45- 50%. There was hypokinesis of the distal anterolateral wall apex, extending to the distal inferior wall.  IMPRESSION:  Severe three-vessel coronary disease with evidence for coronary calcification. 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 Stenosis.      Assessment/Plan: 48 yo man with multiple CRF, including poorly controlled DM, hypertension, tobacco and cocaine abuse and a strong family history of CAD. He presented yesterday with a STEMI. His pain resolved with medical therapy. He has severe 3 vessel CAD. CABG is indicated for survival benefit and relief of symptoms.  I discussed the general nature of CABG, the need for general anesthesia, the use of cardiopulmonary bypass, and the incisions to be used with Mr. Fikes and his family.We reviewed the indications, risks, benefits and alternatives. I discussed the expected hospital stay, overall  recovery and short and long term outcomes. He understands the risks include, but are not limited to death, stroke, MI, DVT/PE, bleeding, possible need for transfusion, infections, cardiac arrhythmias, and other organ system dysfunction including respiratory, renal, or GI complications.   He needs interval medical management of his BP and diabetes and I anticipate surgery will mid next week.  Lucciano Vitali C 11/05/2013, 11:27 AM

## 2013-11-05 NOTE — Progress Notes (Signed)
EKG CRITICAL VALUE     12 lead EKG performed.  Critical value noted.  Paula Compton, RN notified.   Evalee Mutton, CCT 11/05/2013 9:58 AM

## 2013-11-06 ENCOUNTER — Encounter (HOSPITAL_COMMUNITY)
Admission: RE | Disposition: A | Discharge status: Home / Self Care | Payer: Self-pay | Source: Other Acute Inpatient Hospital | Attending: Thoracic Surgery (Cardiothoracic Vascular Surgery)

## 2013-11-06 ENCOUNTER — Inpatient Hospital Stay (HOSPITAL_COMMUNITY): Payer: Medicaid Other | Admitting: Anesthesiology

## 2013-11-06 ENCOUNTER — Encounter (HOSPITAL_COMMUNITY): Payer: Medicaid Other | Admitting: Anesthesiology

## 2013-11-06 DIAGNOSIS — I4892 Unspecified atrial flutter: Secondary | ICD-10-CM

## 2013-11-06 HISTORY — PX: CARDIOVERSION: SHX1299

## 2013-11-06 LAB — BASIC METABOLIC PANEL
Anion gap: 16 — ABNORMAL HIGH (ref 5–15)
BUN: 8 mg/dL (ref 6–23)
CHLORIDE: 95 meq/L — AB (ref 96–112)
CO2: 21 meq/L (ref 19–32)
Calcium: 8.1 mg/dL — ABNORMAL LOW (ref 8.4–10.5)
Creatinine, Ser: 0.69 mg/dL (ref 0.50–1.35)
GFR calc non Af Amer: >90 mL/min (ref >90)
Glucose, Bld: 272 mg/dL — ABNORMAL HIGH (ref 70–99)
POTASSIUM: 3.8 meq/L (ref 3.7–5.3)
Sodium: 132 mEq/L — ABNORMAL LOW (ref 137–147)

## 2013-11-06 LAB — CBC
HCT: 41.2 % (ref 39.0–52.0)
HEMOGLOBIN: 14.1 g/dL (ref 13.0–17.0)
MCH: 29.4 pg (ref 26.0–34.0)
MCHC: 34.2 g/dL (ref 30.0–36.0)
MCV: 86 fL (ref 78.0–100.0)
Platelets: 227 10*3/uL (ref 150–400)
RBC: 4.79 MIL/uL (ref 4.22–5.81)
RDW: 13.3 % (ref 11.5–15.5)
WBC: 11.6 10*3/uL — AB (ref 4.0–10.5)

## 2013-11-06 LAB — HEPARIN LEVEL (UNFRACTIONATED)
HEPARIN UNFRACTIONATED: 0.29 [IU]/mL — AB (ref 0.30–0.70)
HEPARIN UNFRACTIONATED: 0.51 [IU]/mL (ref 0.30–0.70)
Heparin Unfractionated: 0.27 IU/mL — ABNORMAL LOW (ref 0.30–0.70)
Heparin Unfractionated: 0.36 IU/mL (ref 0.30–0.70)

## 2013-11-06 LAB — GLUCOSE, CAPILLARY
GLUCOSE-CAPILLARY: 338 mg/dL — AB (ref 70–99)
GLUCOSE-CAPILLARY: 268 mg/dL — AB (ref 70–99)
Glucose-Capillary: 395 mg/dL — ABNORMAL HIGH (ref 70–99)
Glucose-Capillary: 215 mg/dL — ABNORMAL HIGH (ref 70–99)
Glucose-Capillary: 236 mg/dL — ABNORMAL HIGH (ref 70–99)

## 2013-11-06 LAB — TSH: TSH: 2.56 u[IU]/mL (ref 0.350–4.500)

## 2013-11-06 LAB — MAGNESIUM: Magnesium: 1.8 mg/dL (ref 1.5–2.5)

## 2013-11-06 LAB — LIPID PANEL
CHOL/HDL RATIO: 3.4 ratio
Cholesterol: 149 mg/dL (ref 0–200)
HDL: 44 mg/dL (ref >39)
LDL Cholesterol: 66 mg/dL (ref 0–99)
TRIGLYCERIDES: 196 mg/dL — AB (ref <150)
VLDL: 39 mg/dL (ref 0–40)

## 2013-11-06 SURGERY — CARDIOVERSION
Anesthesia: Monitor Anesthesia Care

## 2013-11-06 MED ORDER — METFORMIN HCL 500 MG PO TABS
1000.0000 mg | ORAL_TABLET | Freq: Two times a day (BID) | ORAL | Status: DC
  Administered 2013-11-06 – 2013-11-08 (×5): 1000 mg via ORAL
  Filled 2013-11-06 (×8): qty 2

## 2013-11-06 MED ORDER — PROPOFOL 10 MG/ML IV BOLUS
INTRAVENOUS | Status: AC
  Filled 2013-11-06: qty 20

## 2013-11-06 MED ORDER — AMIODARONE LOAD VIA INFUSION
150.0000 mg | Freq: Once | INTRAVENOUS | Status: AC
  Administered 2013-11-06: 150 mg via INTRAVENOUS
  Filled 2013-11-06: qty 83.34

## 2013-11-06 MED ORDER — METOPROLOL TARTRATE 1 MG/ML IV SOLN
INTRAVENOUS | Status: AC
  Filled 2013-11-06: qty 5

## 2013-11-06 MED ORDER — INSULIN DETEMIR 100 UNIT/ML ~~LOC~~ SOLN
20.0000 [IU] | Freq: Every day | SUBCUTANEOUS | Status: DC
  Administered 2013-11-06 – 2013-11-08 (×3): 20 [IU] via SUBCUTANEOUS
  Filled 2013-11-06 (×4): qty 0.2

## 2013-11-06 MED ORDER — DILTIAZEM LOAD VIA INFUSION
10.0000 mg | Freq: Once | INTRAVENOUS | Status: AC
  Administered 2013-11-06: 10 mg via INTRAVENOUS
  Filled 2013-11-06: qty 10

## 2013-11-06 MED ORDER — AMIODARONE IV BOLUS ONLY 150 MG/100ML: 150.0000 mg | Freq: Once | INTRAVENOUS | Status: DC

## 2013-11-06 MED ORDER — POTASSIUM CHLORIDE CRYS ER 20 MEQ PO TBCR
20.0000 meq | EXTENDED_RELEASE_TABLET | Freq: Once | ORAL | Status: AC
  Administered 2013-11-06: 20 meq via ORAL
  Filled 2013-11-06: qty 1

## 2013-11-06 MED ORDER — METOPROLOL TARTRATE 1 MG/ML IV SOLN
2.5000 mg | Freq: Once | INTRAVENOUS | Status: AC
  Administered 2013-11-06: 2.5 mg via INTRAVENOUS

## 2013-11-06 MED ORDER — AMIODARONE HCL IN DEXTROSE 360-4.14 MG/200ML-% IV SOLN
60.0000 mg/h | INTRAVENOUS | Status: AC
  Administered 2013-11-06 (×2): 60 mg/h via INTRAVENOUS
  Filled 2013-11-06 (×2): qty 200

## 2013-11-06 MED ORDER — MAGNESIUM SULFATE IN D5W 10-5 MG/ML-% IV SOLN
1.0000 g | Freq: Once | INTRAVENOUS | Status: AC
  Administered 2013-11-06: 1 g via INTRAVENOUS
  Filled 2013-11-06: qty 100

## 2013-11-06 MED ORDER — SUCCINYLCHOLINE CHLORIDE 20 MG/ML IJ SOLN
INTRAMUSCULAR | Status: AC
  Filled 2013-11-06: qty 1

## 2013-11-06 MED ORDER — PROPOFOL 10 MG/ML IV BOLUS
INTRAVENOUS | Status: DC | PRN
  Administered 2013-11-06: 60 mg via INTRAVENOUS

## 2013-11-06 MED ORDER — DILTIAZEM HCL 100 MG IV SOLR
5.0000 mg/h | INTRAVENOUS | Status: DC
  Administered 2013-11-06: 5 mg/h via INTRAVENOUS
  Administered 2013-11-06: 15 mg/h via INTRAVENOUS
  Filled 2013-11-06: qty 100

## 2013-11-06 MED ORDER — INSULIN ASPART 100 UNIT/ML ~~LOC~~ SOLN
0.0000 [IU] | SUBCUTANEOUS | Status: DC
  Administered 2013-11-06: 11 [IU] via SUBCUTANEOUS
  Administered 2013-11-06: 20 [IU] via SUBCUTANEOUS
  Administered 2013-11-06: 11 [IU] via SUBCUTANEOUS
  Administered 2013-11-07: 4 [IU] via SUBCUTANEOUS
  Administered 2013-11-07: 11 [IU] via SUBCUTANEOUS
  Administered 2013-11-07: 7 [IU] via SUBCUTANEOUS

## 2013-11-06 MED ORDER — AMIODARONE HCL IN DEXTROSE 360-4.14 MG/200ML-% IV SOLN
30.0000 mg/h | INTRAVENOUS | Status: DC
  Administered 2013-11-06 – 2013-11-10 (×10): 30 mg/h via INTRAVENOUS
  Filled 2013-11-06 (×21): qty 200

## 2013-11-06 MED ORDER — AMIODARONE HCL IN DEXTROSE 360-4.14 MG/200ML-% IV SOLN
INTRAVENOUS | Status: AC
Start: 2013-11-06 — End: 2013-11-06
  Filled 2013-11-06: qty 200

## 2013-11-06 MED ORDER — AMIODARONE IV BOLUS ONLY 150 MG/100ML
150.0000 mg | Freq: Once | INTRAVENOUS | Status: AC
  Administered 2013-11-06: 150 mg via INTRAVENOUS

## 2013-11-06 MED ORDER — LIDOCAINE HCL (CARDIAC) 20 MG/ML IV SOLN
INTRAVENOUS | Status: AC
  Filled 2013-11-06: qty 5

## 2013-11-06 NOTE — Progress Notes (Signed)
PROGRESS NOTE  Subjective:   Pt was admitted with HTN, severe, CP,  ST elevation. At cath he was found to have severe 3 V CAD UDS was Positive for cocaine   Overnight he developed atrial flutter.   He was in sinus tach yesterday when I examined him .  Received IV amiodarone.  Was also started on IV diltiazem.  He has been on heparin drip.  Comfortable.  Sitting in chair.   Has not had this before    Objective:    Vital Signs:   Temp:  [97.6 F (36.4 C)-98.9 F (37.2 C)] 97.6 F (36.4 C) (09/20 0819) Pulse Rate:  [73-152] 143 (09/20 0700) Resp:  [14-29] 17 (09/20 0700) BP: (90-161)/(38-119) 104/63 mmHg (09/20 0700) SpO2:  [91 %-99 %] 94 % (09/20 0700)  Last BM Date: 11/04/13   24-hour weight change: Weight change:   Weight trends: Filed Weights   11/04/13 1800 11/05/13 0345  Weight: 230 lb 13.2 oz (104.7 kg) 229 lb 12.8 oz (104.237 kg)    Intake/Output:  09/19 0701 - 09/20 0700 In: 2677.8 [P.O.:1110; I.V.:1467.8; IV Piggyback:100] Out: 3225 [Urine:3225]     Physical Exam: BP 104/63  Pulse 143  Temp(Src) 97.6 F (36.4 C) (Oral)  Resp 17  Ht 5\' 9"  (1.753 m)  Wt 229 lb 12.8 oz (104.237 kg)  BMI 33.92 kg/m2  SpO2 94%  Wt Readings from Last 3 Encounters:  11/05/13 229 lb 12.8 oz (104.237 kg)  11/05/13 229 lb 12.8 oz (104.237 kg)  08/31/11 230 lb (104.327 kg)    General: Vital signs reviewed and noted.   Head: Normocephalic, atraumatic.  Eyes: conjunctivae/corneas clear.  EOM's intact.   Throat: normal  Neck:  normal   Lungs:    clear  Heart:  RR, normal s1s2, tachycardia  ( atrial flutter  With V rate of 146)   Abdomen:  Soft, non-tender, non-distended    Extremities: No edema,     cath site is ok  Neurologic: A&O X3, CN II - XII are grossly intact.   Psych: Normal     Labs: BMET:  Recent Labs  11/05/13 0318 11/06/13 0300  NA 136* 132*  K 4.1 3.8  CL 96 95*  CO2 23 21  GLUCOSE 244* 272*  BUN 9 8  CREATININE 0.67 0.69  CALCIUM  8.3* 8.1*  MG  --  1.8    Liver function tests: No results found for this basename: AST, ALT, ALKPHOS, BILITOT, PROT, ALBUMIN,  in the last 72 hours No results found for this basename: LIPASE, AMYLASE,  in the last 72 hours  CBC:  Recent Labs  11/05/13 0318 11/06/13 0300  WBC 14.0* 11.6*  HGB 15.0 14.1  HCT 43.3 41.2  MCV 86.8 86.0  PLT 243 227    Cardiac Enzymes:  Recent Labs  11/05/13 1015 11/05/13 1600 11/05/13 2117  TROPONINI 7.42* 7.61* 5.51*    Coagulation Studies: No results found for this basename: LABPROT, INR,  in the last 72 hours  Other: No components found with this basename: POCBNP,  No results found for this basename: DDIMER,  in the last 72 hours  Recent Labs  11/04/13 1730  HGBA1C 9.3*    Recent Labs  11/06/13 0300  CHOL 149  HDL 44  LDLCALC 66  TRIG 196*  CHOLHDL 3.4    Recent Labs  11/06/13 0330  TSH 2.560   No results found for this basename: VITAMINB12, FOLATE, FERRITIN, TIBC, IRON, RETICCTPCT,  in  the last 72 hours   Other results:  EKG : from EMS  NSR, TWI in inf. And lateral leads.  Repeat ECG has been ordered.     Medications:    Infusions: . amiodarone    . diltiazem (CARDIZEM) infusion 15 mg/hr (11/06/13 0537)  . heparin 1,800 Units/hr (11/06/13 0442)  . nitroGLYCERIN Stopped (11/06/13 0600)    Scheduled Medications: . amLODipine  5 mg Oral Daily  . antiseptic oral rinse  7 mL Mouth Rinse BID  . aspirin EC  325 mg Oral Daily  . atorvastatin  40 mg Oral Daily  . carvedilol  3.125 mg Oral BID WC  . insulin aspart  0-15 Units Subcutaneous TID WC  . insulin aspart  0-5 Units Subcutaneous QHS  . pantoprazole  20 mg Oral Daily    Assessment/ Plan:   Active Problems:   STEMI (ST elevation myocardial infarction)   HTN (hypertension)   DM (diabetes mellitus)   HLD (hyperlipidemia)   Obesity   Cocaine abuse   H/O noncompliance with medical treatment, presenting hazards to health   CAD (coronary artery  disease), native coronary artery  1. CAD:  Severe 3 V CAD.  TCTS has been paged.    2. HTN:   Significant HTN -  He has  Recent cocaine intake. Was started on coreg yesterday.  Received IV metoprolol yesterday   3. DM.  HbA1C is > 9.  He is on sliding scale insulin.  4. Atrial flutter:   He developed atrial flutter at 2 AM.  he has been on heparin. Have talked to Dr. Tobias Alexander.  Will arrange cardioversion today.  He is NPO    Disposition:   Length of Stay: 2  Thayer Headings, Brooke Bonito., MD, [facility] 11/06/2013, 8:59 AM Office [phone] Pager [phone]

## 2013-11-06 NOTE — Progress Notes (Signed)
EKG CRITICAL VALUE     12 lead EKG performed.  Critical value noted. Balinda Quails, RN notified.   Meir Elwood C, CCT 11/06/2013 8:31 AM

## 2013-11-06 NOTE — CV Procedure (Addendum)
    Cardioversion Note  Addam Goeller [phone] 1966/01/01  Procedure: DC Cardioversion Indications: atrial flutter  Procedure Details Consent: Obtained Time Out: Verified patient identification, verified procedure, site/side was marked, verified correct patient position, special equipment/implants available, Radiology Safety Procedures followed,  medications/allergies/relevent history reviewed, required imaging and test results available.  Performed  The patient has been on adequate anticoagulation.  The patient received IV Propofol 50 mg  for sedation.  Synchronous cardioversion was performed at 50  joules.  The cardioversion was successful     Complications: No apparent complications Patient did tolerate procedure well.  Will DC Dilt drip. Continue amiodarone - he'll likely need it peri-operatively.  Stable in NSR at present.  Thayer Headings, Brooke Bonito., MD, [facility] 11/06/2013, 10:09 AM

## 2013-11-06 NOTE — Progress Notes (Signed)
Day of Surgery Procedure(s) (LRB): CARDIOVERSION (N/A) Subjective: No CP this AM Was in a flutter- Dr. Acie Fredrickson cardioverted to SR, no won Amiodarone  Objective: Vital signs in last 24 hours: Temp:  [97.6 F (36.4 C)-98.9 F (37.2 C)] 97.6 F (36.4 C) (09/20 0819) Pulse Rate:  [73-152] 143 (09/20 0700) Cardiac Rhythm:  [-] Normal sinus rhythm;Sinus tachycardia (09/20 0915) Resp:  [14-29] 29 (09/20 0915) BP: (90-161)/(38-119) 93/63 mmHg (09/20 0915) SpO2:  [91 %-99 %] 94 % (09/20 0915)  Hemodynamic parameters for last 24 hours:    Intake/Output from previous day: 09/19 0701 - 09/20 0700 In: 2677.8 [P.O.:1110; I.V.:1467.8; IV Piggyback:100] Out: 0867 [Urine:3225] Intake/Output this shift: Total I/O In: 152.6 [I.V.:152.6] Out: -   General appearance: alert and no distress Neurologic: intact Heart: regular rate and rhythm Lungs: diminished breath sounds bibasilar  Lab Results:  Recent Labs  11/05/13 0318 11/06/13 0300  WBC 14.0* 11.6*  HGB 15.0 14.1  HCT 43.3 41.2  PLT 243 227   BMET:  Recent Labs  11/05/13 0318 11/06/13 0300  NA 136* 132*  K 4.1 3.8  CL 96 95*  CO2 23 21  GLUCOSE 244* 272*  BUN 9 8  CREATININE 0.67 0.69  CALCIUM 8.3* 8.1*    PT/INR: No results found for this basename: LABPROT, INR,  in the last 72 hours ABG No results found for this basename: phart, pco2, po2, hco3, tco2, acidbasedef, o2sat   CBG (last 3)   Recent Labs  11/05/13 1606 11/05/13 2103 11/06/13 0818  GLUCAP 286* 353* 338*    Assessment/Plan: S/P Procedure(s) (LRB): CARDIOVERSION (N/A) -\ Atrial flutter- cardioverted to SR, no won amiodarone  Creatinine better  His diabetes is uncontrolled since admission with CBG in 250-350 range  Resume metformin, start levimir, CBG/SSI q 4  If still elevated after that will need insulin gtt/ [place] CABG Wed 9/23   LOS: 2 days    Latesha Chesney C 11/06/2013

## 2013-11-06 NOTE — Progress Notes (Addendum)
Pt's HR increased to 150-160's, pt diaphoretic and light-headed. STAT EKG performed. Dr. Radford Pax with Cardiology notified. New orders for 2.5 lopressor given, with no relief. BP stable at 110/81. New orders for amiodarone 150 mg bolus and infusion. Will continue to monitor closely.   0245: Pt's HR unchanged after amiodarone bolus + infusion. BP 133/82. Notified Dr. Radford Pax, additional 150 mg amiodarone given, per MD.  0425: Dr. Radford Pax at bedside. New orders for 10 mg Cardizem bolus followed by Cardizem gtt.   0545: Pt's HR still remains in the high 140's, despite amiodarone and Cardizem gtt.  Dr. Radford Pax made aware. Orders for additional 10 mg bolus of Cardizem given and rate increased to max dose at 15 mg/hr.

## 2013-11-06 NOTE — Transfer of Care (Signed)
Immediate Anesthesia Transfer of Care Note  Patient: Shane Frye  Procedure(s) Performed: Procedure(s): CARDIOVERSION (N/A)  Patient Location: PACU and Nursing Unit  Anesthesia Type:MAC  Level of Consciousness: awake, alert , oriented and sedated  Airway & Oxygen Therapy: Patient Spontanous Breathing and Patient connected to nasal cannula oxygen  Post-op Assessment: Report given to PACU RN, Post -op Vital signs reviewed and stable and Patient moving all extremities  Post vital signs: Reviewed and stable  Complications: No apparent anesthesia complications

## 2013-11-06 NOTE — Anesthesia Postprocedure Evaluation (Addendum)
Anesthesia Post Note  Patient: Shane Frye  Procedure(s) Performed: Procedure(s) (LRB): CARDIOVERSION (N/A)  Anesthesia type: general  Patient location: 2H08  Post pain: no pain  Post assessment: Patient's Cardiovascular Status Stable  Last Vitals:  Filed Vitals:   11/06/13 0915  BP: 93/63  Pulse:   Temp:   Resp: 29    Post vital signs: Reviewed and stable  Level of consciousness: alert  Complications: No apparent anesthesia complications

## 2013-11-06 NOTE — H&P (View-Only) (Signed)
PROGRESS NOTE  Subjective:   Pt was admitted with HTN, severe, CP,  ST elevation. At cath he was found to have severe 3 V CAD UDS was Positive for cocaine   Overnight he developed atrial flutter.   He was in sinus tach yesterday when I examined him .  Received IV amiodarone.  Was also started on IV diltiazem.  He has been on heparin drip.  Comfortable.  Sitting in chair.   Has not had this before    Objective:    Vital Signs:   Temp:  [97.6 F (36.4 C)-98.9 F (37.2 C)] 97.6 F (36.4 C) (09/20 0819) Pulse Rate:  [73-152] 143 (09/20 0700) Resp:  [14-29] 17 (09/20 0700) BP: (90-161)/(38-119) 104/63 mmHg (09/20 0700) SpO2:  [91 %-99 %] 94 % (09/20 0700)  Last BM Date: 11/04/13   24-hour weight change: Weight change:   Weight trends: Filed Weights   11/04/13 1800 11/05/13 0345  Weight: 230 lb 13.2 oz (104.7 kg) 229 lb 12.8 oz (104.237 kg)    Intake/Output:  09/19 0701 - 09/20 0700 In: 2677.8 [P.O.:1110; I.V.:1467.8; IV Piggyback:100] Out: 3225 [Urine:3225]     Physical Exam: BP 104/63  Pulse 143  Temp(Src) 97.6 F (36.4 C) (Oral)  Resp 17  Ht 5\' 9"  (1.753 m)  Wt 229 lb 12.8 oz (104.237 kg)  BMI 33.92 kg/m2  SpO2 94%  Wt Readings from Last 3 Encounters:  11/05/13 229 lb 12.8 oz (104.237 kg)  11/05/13 229 lb 12.8 oz (104.237 kg)  08/31/11 230 lb (104.327 kg)    General: Vital signs reviewed and noted.   Head: Normocephalic, atraumatic.  Eyes: conjunctivae/corneas clear.  EOM's intact.   Throat: normal  Neck:  normal   Lungs:    clear  Heart:  RR, normal s1s2, tachycardia  ( atrial flutter  With V rate of 146)   Abdomen:  Soft, non-tender, non-distended    Extremities: No edema,     cath site is ok  Neurologic: A&O X3, CN II - XII are grossly intact.   Psych: Normal     Labs: BMET:  Recent Labs  11/05/13 0318 11/06/13 0300  NA 136* 132*  K 4.1 3.8  CL 96 95*  CO2 23 21  GLUCOSE 244* 272*  BUN 9 8  CREATININE 0.67 0.69  CALCIUM  8.3* 8.1*  MG  --  1.8    Liver function tests: No results found for this basename: AST, ALT, ALKPHOS, BILITOT, PROT, ALBUMIN,  in the last 72 hours No results found for this basename: LIPASE, AMYLASE,  in the last 72 hours  CBC:  Recent Labs  11/05/13 0318 11/06/13 0300  WBC 14.0* 11.6*  HGB 15.0 14.1  HCT 43.3 41.2  MCV 86.8 86.0  PLT 243 227    Cardiac Enzymes:  Recent Labs  11/05/13 1015 11/05/13 1600 11/05/13 2117  TROPONINI 7.42* 7.61* 5.51*    Coagulation Studies: No results found for this basename: LABPROT, INR,  in the last 72 hours  Other: No components found with this basename: POCBNP,  No results found for this basename: DDIMER,  in the last 72 hours  Recent Labs  11/04/13 1730  HGBA1C 9.3*    Recent Labs  11/06/13 0300  CHOL 149  HDL 44  LDLCALC 66  TRIG 196*  CHOLHDL 3.4    Recent Labs  11/06/13 0330  TSH 2.560   No results found for this basename: VITAMINB12, FOLATE, FERRITIN, TIBC, IRON, RETICCTPCT,  in  the last 72 hours   Other results:  EKG : from EMS  NSR, TWI in inf. And lateral leads.  Repeat ECG has been ordered.     Medications:    Infusions: . amiodarone    . diltiazem (CARDIZEM) infusion 15 mg/hr (11/06/13 0537)  . heparin 1,800 Units/hr (11/06/13 0442)  . nitroGLYCERIN Stopped (11/06/13 0600)    Scheduled Medications: . amLODipine  5 mg Oral Daily  . antiseptic oral rinse  7 mL Mouth Rinse BID  . aspirin EC  325 mg Oral Daily  . atorvastatin  40 mg Oral Daily  . carvedilol  3.125 mg Oral BID WC  . insulin aspart  0-15 Units Subcutaneous TID WC  . insulin aspart  0-5 Units Subcutaneous QHS  . pantoprazole  20 mg Oral Daily    Assessment/ Plan:   Active Problems:   STEMI (ST elevation myocardial infarction)   HTN (hypertension)   DM (diabetes mellitus)   HLD (hyperlipidemia)   Obesity   Cocaine abuse   H/O noncompliance with medical treatment, presenting hazards to health   CAD (coronary artery  disease), native coronary artery  1. CAD:  Severe 3 V CAD.  TCTS has been paged.    2. HTN:   Significant HTN -  He has  Recent cocaine intake. Was started on coreg yesterday.  Received IV metoprolol yesterday   3. DM.  HbA1C is > 9.  He is on sliding scale insulin.  4. Atrial flutter:   He developed atrial flutter at 2 AM.  he has been on heparin. Have talked to Dr. Tobias Alexander.  Will arrange cardioversion today.  He is NPO    Disposition:   Length of Stay: 2  Thayer Headings, Brooke Bonito., MD, [facility] 11/06/2013, 8:59 AM Office [phone] Pager [phone]

## 2013-11-06 NOTE — Progress Notes (Signed)
ANTICOAGULATION CONSULT NOTE - Follow Up Consult  Pharmacy Consult for Heparin  Indication: chest pain/ACS, severe 3V disease awaiting CABG  No Known Allergies  Patient Measurements: Height: 5\' 9"  (175.3 cm) Weight: 229 lb 12.8 oz (104.237 kg) IBW/kg (Calculated) : 70.7  Vital Signs: Temp: 98.6 F (37 C) (09/19 2305) Temp src: Oral (09/19 2305) BP: 155/92 mmHg (09/20 0000) Pulse Rate: 112 (09/20 0000)  Labs:  Recent Labs  11/05/13 0318 11/05/13 0919 11/05/13 1015 11/05/13 1600 11/05/13 1651 11/05/13 2117 11/06/13  HGB 15.0  --   --   --   --   --   --   HCT 43.3  --   --   --   --   --   --   PLT 243  --   --   --   --   --   --   HEPARINUNFRC  --  0.12*  --   --  0.37  --  0.27*  CREATININE 0.67  --   --   --   --   --   --   TROPONINI  --   --  7.42* 7.61*  --  5.51*  --     Estimated Creatinine Clearance: 131.4 ml/min (by C-G formula based on Cr of 0.67).   Assessment: Sub-therapeutic HL, other labs as above, no issues per RN.   Goal of Therapy:  Heparin level 0.3-0.7 units/ml Monitor platelets by anticoagulation protocol: Yes   Plan:  -Increase heparin to 1800 units/hr -0800 HL -Daily CBC/HL -Monitor for bleeding  Narda Bonds 11/06/2013,12:45 AM

## 2013-11-06 NOTE — Progress Notes (Signed)
ANTICOAGULATION CONSULT NOTE - Follow Up Consult  Pharmacy Consult for heparin Indication: chest pain/ACS  No Known Allergies  Patient Measurements: Height: 5\' 9"  (175.3 cm) Weight: 229 lb 12.8 oz (104.237 kg) IBW/kg (Calculated) : 70.7 Heparin Dosing Weight: 93 kg  Vital Signs: Temp: 98.2 F (36.8 C) (09/20 2000) Temp src: Oral (09/20 2000) BP: 113/66 mmHg (09/20 1900) Pulse Rate: 81 (09/20 1900)  Labs:  Recent Labs  11/05/13 0318  11/05/13 1015 11/05/13 1600  11/05/13 2117  11/06/13 0300 11/06/13 0834 11/06/13 1403 11/06/13 2102  HGB 15.0  --   --   --   --   --   --  14.1  --   --   --   HCT 43.3  --   --   --   --   --   --  41.2  --   --   --   PLT 243  --   --   --   --   --   --  227  --   --   --   HEPARINUNFRC  --   < >  --   --   < >  --   < >  --  0.36 0.29* 0.51  CREATININE 0.67  --   --   --   --   --   --  0.69  --   --   --   TROPONINI  --   --  7.42* 7.61*  --  5.51*  --   --   --   --   --   < > = values in this interval not displayed.  Estimated Creatinine Clearance: 131.4 ml/min (by C-G formula based on Cr of 0.69).   Medications:  Infusions:  . amiodarone 30 mg/hr (11/06/13 1843)  . heparin 2,000 Units/hr (11/06/13 1843)  . nitroGLYCERIN Stopped (11/06/13 0600)    Assessment: 48 y/o male on IV heparin for 3V CAD awaiting CABG. Heparin level is therapeutic at 0.51 on 2000 units/hr. No bleeding noted.  Goal of Therapy:  Heparin level 0.3-0.7 units/ml Monitor platelets by anticoagulation protocol: Yes   Plan:  - Continue heparin drip at 2000 units/hr - Heparin level and CBC daily  [facility], [place].D., BCPS Clinical Pharmacist Pager: [phone] 11/06/2013 10:21 PM

## 2013-11-06 NOTE — Interval H&P Note (Signed)
History and Physical Interval Note:  11/06/2013 9:55 AM  Shane Frye  has presented today for surgery, with the diagnosis of /  The various methods of treatment have been discussed with the patient and family. After consideration of risks, benefits and other options for treatment, the patient has consented to  Procedure(s): CARDIOVERSION (N/A) as a surgical intervention .  The patient's history has been reviewed, patient examined, no change in status, stable for surgery.  I have reviewed the patient's chart and labs.  Questions were answered to the patient's satisfaction.     [name].

## 2013-11-06 NOTE — Anesthesia Postprocedure Evaluation (Signed)
  Anesthesia Post-op Note  Patient: Shane Frye  Procedure(s) Performed: Procedure(s): CARDIOVERSION (N/A)  Patient Location: PACU and Nursing Unit  Anesthesia Type:MAC  Level of Consciousness: awake, alert , oriented and sedated  Airway and Oxygen Therapy: Patient Spontanous Breathing and Patient connected to nasal cannula oxygen  Post-op Pain: none  Post-op Assessment: Post-op Vital signs reviewed, Patient's Cardiovascular Status Stable, Respiratory Function Stable, Patent Airway, No signs of Nausea or vomiting and Adequate PO intake  Post-op Vital Signs: Reviewed and stable  Last Vitals:  Filed Vitals:   11/06/13 0915  BP: 93/63  Pulse:   Temp:   Resp: 29    Complications: No apparent anesthesia complications

## 2013-11-06 NOTE — Anesthesia Preprocedure Evaluation (Addendum)
Anesthesia Evaluation  Patient identified by MRN, date of birth, ID band Patient awake    Reviewed: Allergy & Precautions, H&P , NPO status , Patient's Chart, lab work & pertinent test results  History of Anesthesia Complications Negative for: history of anesthetic complications  Airway Mallampati: III TM Distance: >3 FB Neck ROM: Full    Dental  (+) Teeth Intact, Dental Advisory Given   Pulmonary Current Smoker,    Pulmonary exam normal       Cardiovascular hypertension, + CAD and + Past MI Rhythm:Regular Rate:Tachycardia  Severe three-vessel coronary disease with evidence for coronary calcification.  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  Stenosis.      Neuro/Psych negative neurological ROS  negative psych ROS   GI/Hepatic GERD-  ,(+)     substance abuse  ,   Endo/Other  diabetes  Renal/GU negative Renal ROS     Musculoskeletal   Abdominal   Peds  Hematology   Anesthesia Other Findings   Reproductive/Obstetrics                          Anesthesia Physical Anesthesia Plan  ASA: III and emergent  Anesthesia Plan: General   Post-op Pain Management:    Induction: Intravenous  Airway Management Planned: Mask  Additional Equipment:   Intra-op Plan:   Post-operative Plan:   Informed Consent: I have reviewed the patients History and Physical, chart, labs and discussed the procedure including the risks, benefits and alternatives for the proposed anesthesia with the patient or authorized representative who has indicated his/her understanding and acceptance.   Dental advisory given  Plan Discussed with: CRNA, Anesthesiologist and Surgeon  Anesthesia  Plan Comments:         Anesthesia Quick Evaluation

## 2013-11-06 NOTE — Progress Notes (Signed)
CTSP due to persistent tachycardia.  Patient has been persistently tachycardic in the low 100's but very early this am went into SVT consistent with atrial fibrillation with RVR.  Rhythm initially irregular.  Given IV Lopressor with no improvement.  Started on IV amio bolus 150mg  followed by a gtt.  Now appears to be in atrial flutter at 145bpm.  Patient is sleeping comfortably.  His hemodynamically stable with BP 157/78mmHg.  The nurse states that his only complaint is sweating but  according the patient he normally sweats at night and this has been going on for years.  He denies any chest pain, pressure or SOB.  Of note, prior to going in to see patient, he was observed to be snoring loudly and most likely has some degree of OSA which may be driving the atrial arrhythmias.   EKG repeated showing atrial flutter at 147bpm.  EKG machine reads out acute inferior ST elevation but there are very prominent flutter waves creating appearance of ST elevation with Q waves noted inferiorly.  Patient denies any complaints at present.  Will give a Cardizem bolus of 10mg  followed by gtt at 5mg /hr and titrate as BP tolerates to try to get HR<100.  Will check BMET, MG and TSH.  Make NPO for possible DCCV in am if he dose not convert.  Also would recommend outpt sleep study after CABG.

## 2013-11-06 NOTE — Progress Notes (Addendum)
ANTICOAGULATION CONSULT NOTE - Initial Consult  Pharmacy Consult for Heparin Indication: chest pain/ACS  No Known Allergies  Patient Measurements: Height: 5\' 9"  (175.3 cm) Weight: 229 lb 12.8 oz (104.237 kg) IBW/kg (Calculated) : 70.7 Actual Body Weight: 104.7 kg Ideal Body Weight: 70.5 kg Heparin Dosing Weight: 93 kg  Vital Signs: Temp: 97.6 F (36.4 C) (09/20 0819) Temp src: Oral (09/20 0819) BP: 93/63 mmHg (09/20 0915) Pulse Rate: 143 (09/20 0700)  Labs:  Recent Labs  11/05/13 0318  11/05/13 1015 11/05/13 1600 11/05/13 1651 11/05/13 2117 11/06/13 11/06/13 0300 11/06/13 0834  HGB 15.0  --   --   --   --   --   --  14.1  --   HCT 43.3  --   --   --   --   --   --  41.2  --   PLT 243  --   --   --   --   --   --  227  --   HEPARINUNFRC  --   < >  --   --  0.37  --  0.27*  --  0.36  CREATININE 0.67  --   --   --   --   --   --  0.69  --   TROPONINI  --   --  7.42* 7.61*  --  5.51*  --   --   --   < > = values in this interval not displayed.  Estimated Creatinine Clearance: 131.4 ml/min (by C-G formula based on Cr of 0.69).   Medical History: Past Medical History  Diagnosis Date  . Diabetes mellitus   . Hypertension   . GERD (gastroesophageal reflux disease)     Medications:  Prescriptions prior to admission  Medication Sig Dispense Refill  . Menthol, Topical Analgesic, (FLEXALL MAXIMUM STRENGTH EX) Apply 1 application topically daily.       Marland Kitchen atorvastatin (LIPITOR) 10 MG tablet Take 10 mg by mouth daily.      . carvedilol (COREG) 12.5 MG tablet Take 12.5 mg by mouth 2 (two) times daily with a meal.      . Choline Fenofibrate (TRILIPIX) 45 MG capsule Take 45 mg by mouth daily.      Marland Kitchen gabapentin (NEURONTIN) 300 MG capsule Take 300 mg by mouth at bedtime.       . metFORMIN (GLUCOPHAGE) 1000 MG tablet Take 1,000 mg by mouth 2 (two) times daily with a meal.      . pantoprazole (PROTONIX) 20 MG tablet Take 20 mg by mouth daily.      .  quinapril-hydrochlorothiazide (ACCURETIC) 20-25 MG per tablet Take 1 tablet by mouth 2 (two) times daily.       Scheduled:  . amLODipine  5 mg Oral Daily  . antiseptic oral rinse  7 mL Mouth Rinse BID  . aspirin EC  325 mg Oral Daily  . atorvastatin  40 mg Oral Daily  . carvedilol  3.125 mg Oral BID WC  . insulin aspart  0-15 Units Subcutaneous TID WC  . insulin aspart  0-5 Units Subcutaneous QHS  . pantoprazole  20 mg Oral Daily   Infusions:  . amiodarone 60 mg/hr (11/06/13 0903)  . diltiazem (CARDIZEM) infusion 15 mg/hr (11/06/13 0903)  . heparin 1,800 Units/hr (11/06/13 0442)  . nitroGLYCERIN Stopped (11/06/13 0600)    Assessment: 48yo male presents for treatment of STEMI from [facility]. In cath found to have severe 3V disease, consulted for CABG. Pharmacy  is consulted to dose heparin 6 hours after sheath removal. HL is now therapeutic at 0.36 after re-bolus and rate increase. H/H, plt wnl and stable with no reported s/s bleeding.   Goal of Therapy:  Heparin level 0.3-0.7 units/ml Monitor platelets by anticoagulation protocol: Yes   Plan:  - Continue hep gtt @ 1800 u/hr - Check anti-Xa level in 6 hours and daily while on heparin - Continue to monitor H&H and platelets - F/u plans for CABG  Harolyn Rutherford, PharmD Clinical Pharmacist - Resident Pager: [phone] Pharmacy: [phone] 11/06/2013 10:00 AM   ADDENDUM:  - Confirmatory HL trended back down to slightly below goal at 0.29  - Will increase rate again to 2000 u/hr - Recheck in 6 hours  Harolyn Rutherford, PharmD Clinical Pharmacist - Resident Pager: [phone] Pharmacy: [phone] 11/06/2013 3:07 PM

## 2013-11-07 ENCOUNTER — Inpatient Hospital Stay (HOSPITAL_COMMUNITY): Payer: Medicaid Other

## 2013-11-07 ENCOUNTER — Encounter: Payer: Self-pay | Admitting: Cardiovascular Disease

## 2013-11-07 ENCOUNTER — Encounter (HOSPITAL_COMMUNITY): Payer: Self-pay | Admitting: Cardiovascular Disease

## 2013-11-07 ENCOUNTER — Other Ambulatory Visit: Payer: Self-pay

## 2013-11-07 DIAGNOSIS — I2510 Atherosclerotic heart disease of native coronary artery without angina pectoris: Secondary | ICD-10-CM

## 2013-11-07 LAB — PULMONARY FUNCTION TEST
FEF 25-75 POST: 2.72 L/s
FEF 25-75 PRE: 2.34 L/s
FEF2575-%Change-Post: 16 %
FEF2575-%PRED-POST: 81 %
FEF2575-%PRED-PRE: 69 %
FEV1-%Change-Post: 3 %
FEV1-%Pred-Post: 81 %
FEV1-%Pred-Pre: 78 %
FEV1-PRE: 2.97 L
FEV1-Post: 3.08 L
FEV1FVC-%Change-Post: 0 %
FEV1FVC-%PRED-PRE: 91 %
FEV6-%CHANGE-POST: 3 %
FEV6-%PRED-POST: 91 %
FEV6-%PRED-PRE: 88 %
FEV6-Post: 4.32 L
FEV6-Pre: 4.18 L
FEV6FVC-%CHANGE-POST: 0 %
FEV6FVC-%PRED-POST: 103 %
FEV6FVC-%Pred-Pre: 102 %
FVC-%Change-Post: 3 %
FVC-%PRED-POST: 88 %
FVC-%PRED-PRE: 85 %
FVC-POST: 4.32 L
FVC-PRE: 4.18 L
PRE FEV6/FVC RATIO: 100 %
Post FEV1/FVC ratio: 71 %
Post FEV6/FVC ratio: 100 %
Pre FEV1/FVC ratio: 71 %

## 2013-11-07 LAB — GLUCOSE, CAPILLARY
GLUCOSE-CAPILLARY: 292 mg/dL — AB (ref 70–99)
Glucose-Capillary: 124 mg/dL — ABNORMAL HIGH (ref 70–99)
Glucose-Capillary: 208 mg/dL — ABNORMAL HIGH (ref 70–99)
Glucose-Capillary: 227 mg/dL — ABNORMAL HIGH (ref 70–99)

## 2013-11-07 LAB — CBC
HEMATOCRIT: 41.2 % (ref 39.0–52.0)
Hemoglobin: 13.8 g/dL (ref 13.0–17.0)
MCH: 29 pg (ref 26.0–34.0)
MCHC: 33.5 g/dL (ref 30.0–36.0)
MCV: 86.6 fL (ref 78.0–100.0)
Platelets: 229 10*3/uL (ref 150–400)
RBC: 4.76 MIL/uL (ref 4.22–5.81)
RDW: 13.4 % (ref 11.5–15.5)
WBC: 10 10*3/uL (ref 4.0–10.5)

## 2013-11-07 LAB — HEPARIN LEVEL (UNFRACTIONATED): Heparin Unfractionated: 0.52 IU/mL (ref 0.30–0.70)

## 2013-11-07 MED ORDER — INSULIN ASPART 100 UNIT/ML ~~LOC~~ SOLN
5.0000 [IU] | Freq: Three times a day (TID) | SUBCUTANEOUS | Status: DC
  Administered 2013-11-07 – 2013-11-08 (×4): 5 [IU] via SUBCUTANEOUS

## 2013-11-07 MED ORDER — ALPRAZOLAM 0.25 MG PO TABS
0.2500 mg | ORAL_TABLET | ORAL | Status: DC | PRN
  Administered 2013-11-08: 0.5 mg via ORAL
  Filled 2013-11-07: qty 2

## 2013-11-07 MED ORDER — ALBUTEROL SULFATE (2.5 MG/3ML) 0.083% IN NEBU
2.5000 mg | INHALATION_SOLUTION | Freq: Once | RESPIRATORY_TRACT | Status: AC
  Administered 2013-11-07: 2.5 mg via RESPIRATORY_TRACT

## 2013-11-07 MED ORDER — INSULIN ASPART 100 UNIT/ML ~~LOC~~ SOLN
0.0000 [IU] | Freq: Three times a day (TID) | SUBCUTANEOUS | Status: DC
  Administered 2013-11-08: 7 [IU] via SUBCUTANEOUS
  Administered 2013-11-08: 4 [IU] via SUBCUTANEOUS
  Administered 2013-11-08: 7 [IU] via SUBCUTANEOUS

## 2013-11-07 NOTE — Progress Notes (Signed)
No complaints  BP 143/94  Pulse 76  Temp(Src) 98 F (36.7 C) (Oral)  Resp 18  Ht 5\' 9"  (1.753 m)  Wt 229 lb 12.8 oz (104.237 kg)  BMI 33.92 kg/m2  SpO2 95%   Intake/Output Summary (Last 24 hours) at 11/07/13 1423 Last data filed at 11/07/13 1300  Gross per 24 hour  Intake   1485 ml  Output   2650 ml  Net  -1165 ml    CBG (last 3)   Recent Labs  11/06/13 2305 11/07/13 0419 11/07/13 0930  GLUCAP 236* 124* 292*    No CP or SOB, maintaining SR Exam unchanged  CBG still poorly controlled- I have added meal coverage with novolog insulin  He has questions about disability  For CABG Wed 9/23

## 2013-11-07 NOTE — Progress Notes (Deleted)
2nd: HL 0.51 - cont 2000 units/hr, f/u in am. JD  CC: 48 yo M admitted on 9/18 as code stemi - non compliant with home meds and UDS +cocaine. Found to have severe 3 v disease, now CABG consult  PMH: DMII, HTN, GERD  AC: Hep gtt for STEMI; developed aflutter on 9/19; therapeutic on 2000 units/hr (0.52)  ID: Afeb, wbc wnl, no cx  CV: STEMI, HTN; new aflutter SBP improved (90-140s), HR tachy to 143 (developed aflutter overnight on 9/19), s/p DCCV for aflutter, CABG prob Tuesday On ASA, amlodipine, atorva 40, Coreg, amio and dilt gtt  Endo: CBG elev 286-353 (124 this AM) on SSI -- may need some basal?  GI: Heart healthy/carb modified, PO PPI  Neuro: UDS+ cocaine  Renal: SCr wnl at 0.69, lytes wnl  Pulm: RA  Heme/Onc: H/H, plt wnl and stable   PTA Medication Issues: non-compliant -- fenofibrate, metformin, quinapril/hctz  Best Practices: Hep gtt  Plan:  Continue heparin 2000 u/hr Daily HL - Check confirm level - Continue to monitor daily HL, H&H and platelets - F/u plans for CABG - DCCV today

## 2013-11-07 NOTE — Progress Notes (Signed)
ANTICOAGULATION CONSULT NOTE - Follow Up Consult  Pharmacy Consult for heparin Indication: chest pain/ACS  No Known Allergies  Patient Measurements: Height: 5\' 9"  (175.3 cm) Weight: 229 lb 12.8 oz (104.237 kg) IBW/kg (Calculated) : 70.7 Heparin Dosing Weight: 93 kg  Vital Signs: Temp: 98 F (36.7 C) (09/21 1155) Temp src: Oral (09/21 1155) BP: 143/94 mmHg (09/21 0700) Pulse Rate: 76 (09/21 0700)  Labs:  Recent Labs  11/05/13 0318  11/05/13 1015 11/05/13 1600  11/05/13 2117  11/06/13 0300  11/06/13 1403 11/06/13 2102 11/07/13 0245 11/07/13 0248  HGB 15.0  --   --   --   --   --   --  14.1  --   --   --   --  13.8  HCT 43.3  --   --   --   --   --   --  41.2  --   --   --   --  41.2  PLT 243  --   --   --   --   --   --  227  --   --   --   --  229  HEPARINUNFRC  --   < >  --   --   < >  --   < >  --   < > 0.29* 0.51 0.52  --   CREATININE 0.67  --   --   --   --   --   --  0.69  --   --   --   --   --   TROPONINI  --   --  7.42* 7.61*  --  5.51*  --   --   --   --   --   --   --   < > = values in this interval not displayed.  Estimated Creatinine Clearance: 131.4 ml/min (by C-G formula based on Cr of 0.69).   Medications:  Scheduled:  . amLODipine  5 mg Oral Daily  . antiseptic oral rinse  7 mL Mouth Rinse BID  . aspirin EC  325 mg Oral Daily  . atorvastatin  40 mg Oral Daily  . carvedilol  3.125 mg Oral BID WC  . insulin aspart  0-20 Units Subcutaneous 6 times per day  . insulin aspart  0-5 Units Subcutaneous QHS  . insulin detemir  20 Units Subcutaneous QHS  . metFORMIN  1,000 mg Oral BID WC  . pantoprazole  20 mg Oral Daily   Infusions:  . amiodarone 30 mg/hr (11/07/13 0617)  . heparin 2,000 Units/hr (11/07/13 0801)  . nitroGLYCERIN Stopped (11/06/13 0600)    Assessment: 48 yo m admitted on 9/18 as code STEM.  Patient found w/ severe 3 vessel disease, for CABG on Wednesday 9/23. Pharmacy is consulted to dose and adjust heparin.  HL this AM was  therapeutic at 0.52 on 2,000 units/hr. Will continue heparin infusion at 2,000 units/hr. Daily HL with AM labs. Hgb 13.8, plts 229, no s/s of bleeding.  Goal of Therapy:  Heparin level 0.3-0.7 units/ml Monitor platelets by anticoagulation protocol: Yes   Plan:  Continue heparin infusion at 2,000 units/hr Daily HL with AM labs Monitor hgb/plts, s/s of bleeding, clinical course  Imojean Yoshino L. Nicole Kindred, PharmD Clinical Pharmacy Resident Pager: [phone] 11/07/2013 12:29 PM

## 2013-11-07 NOTE — Progress Notes (Signed)
PROGRESS NOTE  Subjective:   Pt was admitted with HTN, severe, CP,  ST elevation. At cath he was found to have severe 3 V CAD UDS was Positive for cocaine    he had atrial flutter yesterday and  was cardioverted yesterday   Comfortable.  Sitting in chair.    amio IV is still goin g    Objective:    Vital Signs:   Temp:  [98 F (36.7 C)-98.2 F (36.8 C)] 98 F (36.7 C) (09/21 0800) Pulse Rate:  [73-144] 76 (09/21 0700) Resp:  [0-33] 18 (09/21 0700) BP: (90-151)/(49-97) 143/94 mmHg (09/21 0700) SpO2:  [91 %-99 %] 91 % (09/21 0700)  Last BM Date: 11/04/13   24-hour weight change: Weight change:   Weight trends: Filed Weights   11/04/13 1800 11/05/13 0345  Weight: 230 lb 13.2 oz (104.7 kg) 229 lb 12.8 oz (104.237 kg)    Intake/Output:  09/20 0701 - 09/21 0700 In: 1746 [P.O.:560; I.V.:1186] Out: 1850 [Urine:1850] Total I/O In: 286.7 [P.O.:240; I.V.:46.7] Out: 300 [Urine:300]   Physical Exam: BP 143/94  Pulse 76  Temp(Src) 98 F (36.7 C) (Oral)  Resp 18  Ht 5\' 9"  (1.753 m)  Wt 229 lb 12.8 oz (104.237 kg)  BMI 33.92 kg/m2  SpO2 91%  Wt Readings from Last 3 Encounters:  11/05/13 229 lb 12.8 oz (104.237 kg)  11/05/13 229 lb 12.8 oz (104.237 kg)  11/05/13 229 lb 12.8 oz (104.237 kg)    General: Vital signs reviewed and noted.   Head: Normocephalic, atraumatic.  Eyes: conjunctivae/corneas clear.  EOM's intact.   Throat: normal  Neck:  normal   Lungs:  clear  Heart:  RR  NSR  Abdomen:  Soft, non-tender, non-distended    Extremities: No edema,     cath site is ok  Neurologic: A&O X3, CN II - XII are grossly intact.   Psych: Normal     Labs: BMET:  Recent Labs  11/05/13 0318 11/06/13 0300  NA 136* 132*  K 4.1 3.8  CL 96 95*  CO2 23 21  GLUCOSE 244* 272*  BUN 9 8  CREATININE 0.67 0.69  CALCIUM 8.3* 8.1*  MG  --  1.8    Liver function tests: No results found for this basename: AST, ALT, ALKPHOS, BILITOT, PROT, ALBUMIN,  in the  last 72 hours No results found for this basename: LIPASE, AMYLASE,  in the last 72 hours  CBC:  Recent Labs  11/06/13 0300 11/07/13 0248  WBC 11.6* 10.0  HGB 14.1 13.8  HCT 41.2 41.2  MCV 86.0 86.6  PLT 227 229    Cardiac Enzymes:  Recent Labs  11/05/13 1015 11/05/13 1600 11/05/13 2117  TROPONINI 7.42* 7.61* 5.51*    Coagulation Studies: No results found for this basename: LABPROT, INR,  in the last 72 hours  Other: No components found with this basename: POCBNP,  No results found for this basename: DDIMER,  in the last 72 hours  Recent Labs  11/04/13 1730  HGBA1C 9.3*    Recent Labs  11/06/13 0300  CHOL 149  HDL 44  LDLCALC 66  TRIG 196*  CHOLHDL 3.4    Recent Labs  11/06/13 0330  TSH 2.560   No results found for this basename: VITAMINB12, FOLATE, FERRITIN, TIBC, IRON, RETICCTPCT,  in the last 72 hours   Other results:  EKG : from EMS  NSR, TWI in inf. And lateral leads.  Repeat ECG has been ordered.  Medications:    Infusions: . amiodarone 30 mg/hr (11/07/13 0617)  . heparin 2,000 Units/hr (11/07/13 0801)  . nitroGLYCERIN Stopped (11/06/13 0600)    Scheduled Medications: . amLODipine  5 mg Oral Daily  . antiseptic oral rinse  7 mL Mouth Rinse BID  . aspirin EC  325 mg Oral Daily  . atorvastatin  40 mg Oral Daily  . carvedilol  3.125 mg Oral BID WC  . insulin aspart  0-20 Units Subcutaneous 6 times per day  . insulin aspart  0-5 Units Subcutaneous QHS  . insulin detemir  20 Units Subcutaneous QHS  . metFORMIN  1,000 mg Oral BID WC  . pantoprazole  20 mg Oral Daily    Assessment/ Plan:   Active Problems:   STEMI (ST elevation myocardial infarction)   HTN (hypertension)   DM (diabetes mellitus)   HLD (hyperlipidemia)   Obesity   Cocaine abuse   H/O noncompliance with medical treatment, presenting hazards to health   CAD (coronary artery disease), native coronary artery  1. CAD:  Severe 3 V CAD.   Stable.  For CABG  possibly Wednesday   2. HTN:   BP has improved.   3. DM.  HbA1C is > 9.  He is back on medications   4. Atrial flutter:   - s/p cardioversion Cont. amio for now.   Transfer to step down  Disposition:   Length of Stay: 3  Thayer Headings, Brooke Bonito., MD, [facility] 11/07/2013, 9:00 AM Office [phone] Pager [phone]

## 2013-11-07 NOTE — Progress Notes (Signed)
CARDIAC REHAB PHASE I   PRE:  Rate/Rhythm: 84 SR  BP:  Supine:   Sitting: 145/96  Standing:    SaO2: 95%RA  MODE:  Ambulation: 390 ft   POST:  Rate/Rhythm: 89 SR  BP:  Supine:   Sitting: 130/83  Standing:    SaO2: 97%RA 1355-1440 Pt walked 390 ft with steady gait. No CP. Remained in NSR. Tolerated well. To recliner after walk. Pre op ed completed. Discussed importance of sternal precautions after surgery and importance of IS and mobility to improve respiratory status after surgery. Gave pt OHS booklet and care guide. Wrote how to watch pre op video so pt and wife can watch when she arrives. Pt stated he has a good family and he will have someone with him after surgery for 24/7 once he is discharged. Discussed smoking cessation and pt stated he is going to quit cold [place] as he quit once for 5 years this way.    Graylon Good, RN BSN  11/07/2013 2:36 PM

## 2013-11-08 ENCOUNTER — Inpatient Hospital Stay (HOSPITAL_COMMUNITY): Payer: Medicaid Other

## 2013-11-08 DIAGNOSIS — Z01810 Encounter for preprocedural cardiovascular examination: Secondary | ICD-10-CM

## 2013-11-08 DIAGNOSIS — I2510 Atherosclerotic heart disease of native coronary artery without angina pectoris: Secondary | ICD-10-CM

## 2013-11-08 LAB — URINALYSIS, ROUTINE W REFLEX MICROSCOPIC
Bilirubin Urine: NEGATIVE
GLUCOSE, UA: NEGATIVE mg/dL
HGB URINE DIPSTICK: NEGATIVE
Ketones, ur: 15 mg/dL — AB
Leukocytes, UA: NEGATIVE
Nitrite: NEGATIVE
PROTEIN: NEGATIVE mg/dL
Specific Gravity, Urine: 1.023 (ref 1.005–1.030)
Urobilinogen, UA: 1 mg/dL (ref 0.0–1.0)
pH: 5.5 (ref 5.0–8.0)

## 2013-11-08 LAB — CBC
HCT: 41.1 % (ref 39.0–52.0)
HEMATOCRIT: 40.6 % (ref 39.0–52.0)
HEMOGLOBIN: 13.6 g/dL (ref 13.0–17.0)
Hemoglobin: 14 g/dL (ref 13.0–17.0)
MCH: 29 pg (ref 26.0–34.0)
MCH: 29.3 pg (ref 26.0–34.0)
MCHC: 33.5 g/dL (ref 30.0–36.0)
MCHC: 34.1 g/dL (ref 30.0–36.0)
MCV: 86.6 fL (ref 78.0–100.0)
MCV: 86 fL (ref 78.0–100.0)
PLATELETS: 227 10*3/uL (ref 150–400)
PLATELETS: 236 10*3/uL (ref 150–400)
RBC: 4.69 MIL/uL (ref 4.22–5.81)
RBC: 4.78 MIL/uL (ref 4.22–5.81)
RDW: 13.3 % (ref 11.5–15.5)
RDW: 13.3 % (ref 11.5–15.5)
WBC: 8.5 10*3/uL (ref 4.0–10.5)
WBC: 7.8 10*3/uL (ref 4.0–10.5)

## 2013-11-08 LAB — COMPREHENSIVE METABOLIC PANEL
ALT: 17 U/L (ref 0–53)
AST: 42 U/L — ABNORMAL HIGH (ref 0–37)
Albumin: 3.1 g/dL — ABNORMAL LOW (ref 3.5–5.2)
Alkaline Phosphatase: 56 U/L (ref 39–117)
Anion gap: 15 (ref 5–15)
BUN: 10 mg/dL (ref 6–23)
CO2: 23 meq/L (ref 19–32)
CREATININE: 0.71 mg/dL (ref 0.50–1.35)
Calcium: 9 mg/dL (ref 8.4–10.5)
Chloride: 99 mEq/L (ref 96–112)
Glucose, Bld: 131 mg/dL — ABNORMAL HIGH (ref 70–99)
Potassium: 4.1 mEq/L (ref 3.7–5.3)
Sodium: 137 mEq/L (ref 137–147)
TOTAL PROTEIN: 6.3 g/dL (ref 6.0–8.3)
Total Bilirubin: 0.5 mg/dL (ref 0.3–1.2)

## 2013-11-08 LAB — GLUCOSE, CAPILLARY
Glucose-Capillary: 185 mg/dL — ABNORMAL HIGH (ref 70–99)
Glucose-Capillary: 157 mg/dL — ABNORMAL HIGH (ref 70–99)
Glucose-Capillary: 201 mg/dL — ABNORMAL HIGH (ref 70–99)
Glucose-Capillary: 202 mg/dL — ABNORMAL HIGH (ref 70–99)
Glucose-Capillary: 130 mg/dL — ABNORMAL HIGH (ref 70–99)

## 2013-11-08 LAB — SURGICAL PCR SCREEN
MRSA, PCR: NEGATIVE
Staphylococcus aureus: NEGATIVE

## 2013-11-08 LAB — TYPE AND SCREEN
ABO/RH(D): A NEG
Antibody Screen: NEGATIVE

## 2013-11-08 LAB — APTT: APTT: 106 s — AB (ref 24–37)

## 2013-11-08 LAB — HEPARIN LEVEL (UNFRACTIONATED): HEPARIN UNFRACTIONATED: 0.57 [IU]/mL (ref 0.30–0.70)

## 2013-11-08 LAB — ABO/RH: ABO/RH(D): A NEG

## 2013-11-08 LAB — PROTIME-INR
INR: 1.03 (ref 0.00–1.49)
PROTHROMBIN TIME: 13.5 s (ref 11.6–15.2)

## 2013-11-08 MED ORDER — DOPAMINE-DEXTROSE 3.2-5 MG/ML-% IV SOLN
2.0000 ug/kg/min | INTRAVENOUS | Status: AC
  Administered 2013-11-09: 3 ug/kg/min via INTRAVENOUS
  Filled 2013-11-08: qty 250

## 2013-11-08 MED ORDER — MAGNESIUM SULFATE 50 % IJ SOLN
40.0000 meq | INTRAMUSCULAR | Status: DC
  Filled 2013-11-08: qty 10

## 2013-11-08 MED ORDER — TEMAZEPAM 15 MG PO CAPS: 15.0000 mg | ORAL_CAPSULE | Freq: Once | ORAL | Status: AC | PRN

## 2013-11-08 MED ORDER — DEXMEDETOMIDINE HCL IN NACL 400 MCG/100ML IV SOLN
0.1000 ug/kg/h | INTRAVENOUS | Status: AC
Start: 2013-11-09 — End: 2013-11-09
  Administered 2013-11-09: 0.3 ug/kg/h via INTRAVENOUS
  Filled 2013-11-08: qty 100

## 2013-11-08 MED ORDER — EPINEPHRINE HCL 1 MG/ML IJ SOLN
0.5000 ug/min | INTRAVENOUS | Status: DC
  Filled 2013-11-08: qty 4

## 2013-11-08 MED ORDER — METOPROLOL TARTRATE 12.5 MG HALF TABLET
12.5000 mg | ORAL_TABLET | Freq: Once | ORAL | Status: AC
  Administered 2013-11-09: 12.5 mg via ORAL
  Filled 2013-11-08: qty 1

## 2013-11-08 MED ORDER — VANCOMYCIN HCL 10 G IV SOLR
1250.0000 mg | INTRAVENOUS | Status: AC
  Administered 2013-11-09: 1250 mg via INTRAVENOUS
  Filled 2013-11-08: qty 1250

## 2013-11-08 MED ORDER — SODIUM CHLORIDE 0.9 % IV SOLN
INTRAVENOUS | Status: DC
  Filled 2013-11-08: qty 30

## 2013-11-08 MED ORDER — NITROGLYCERIN IN D5W 200-5 MCG/ML-% IV SOLN
2.0000 ug/min | INTRAVENOUS | Status: DC
  Filled 2013-11-08: qty 250

## 2013-11-08 MED ORDER — DEXTROSE 5 % IV SOLN
1.5000 g | INTRAVENOUS | Status: AC
  Administered 2013-11-09: .75 g via INTRAVENOUS
  Administered 2013-11-09: 1.5 g via INTRAVENOUS
  Filled 2013-11-08: qty 1.5

## 2013-11-08 MED ORDER — CHLORHEXIDINE GLUCONATE 4 % EX LIQD
60.0000 mL | Freq: Once | CUTANEOUS | Status: AC
  Administered 2013-11-09: 4 via TOPICAL
  Filled 2013-11-08: qty 60

## 2013-11-08 MED ORDER — PLASMA-LYTE 148 IV SOLN
INTRAVENOUS | Status: AC
  Administered 2013-11-09: 11:00:00
  Filled 2013-11-08: qty 2.5

## 2013-11-08 MED ORDER — POTASSIUM CHLORIDE 2 MEQ/ML IV SOLN
80.0000 meq | INTRAVENOUS | Status: DC
  Filled 2013-11-08: qty 40

## 2013-11-08 MED ORDER — SODIUM CHLORIDE 0.9 % IV SOLN
INTRAVENOUS | Status: AC
  Administered 2013-11-09: 69.8 mL/h via INTRAVENOUS
  Filled 2013-11-08: qty 40

## 2013-11-08 MED ORDER — SODIUM CHLORIDE 0.9 % IV SOLN
INTRAVENOUS | Status: AC
  Administered 2013-11-09: 3.1 [IU]/h via INTRAVENOUS
  Filled 2013-11-08: qty 2.5

## 2013-11-08 MED ORDER — PHENYLEPHRINE HCL 10 MG/ML IJ SOLN
30.0000 ug/min | INTRAVENOUS | Status: AC
  Administered 2013-11-09: 50 ug/min via INTRAVENOUS
  Filled 2013-11-08: qty 2

## 2013-11-08 MED ORDER — CHLORHEXIDINE GLUCONATE 4 % EX LIQD
60.0000 mL | Freq: Once | CUTANEOUS | Status: AC
  Administered 2013-11-08: 4 via TOPICAL

## 2013-11-08 MED ORDER — DIAZEPAM 5 MG PO TABS
5.0000 mg | ORAL_TABLET | Freq: Once | ORAL | Status: AC
  Administered 2013-11-09: 5 mg via ORAL
  Filled 2013-11-08: qty 1

## 2013-11-08 MED ORDER — DEXTROSE 5 % IV SOLN
750.0000 mg | INTRAVENOUS | Status: DC
  Filled 2013-11-08: qty 750

## 2013-11-08 MED ORDER — BISACODYL 5 MG PO TBEC
5.0000 mg | DELAYED_RELEASE_TABLET | Freq: Once | ORAL | Status: AC
  Administered 2013-11-08: 5 mg via ORAL
  Filled 2013-11-08: qty 1

## 2013-11-08 NOTE — Progress Notes (Signed)
[place] to walk with pt. Declined due to headache and not feeling too good. Stated a little nervous about tomorrow. Has watched pre op video. Will follow up after surgery. Graylon Good RN BSN 11/08/2013 2:44 PM

## 2013-11-08 NOTE — Progress Notes (Signed)
ANTICOAGULATION CONSULT NOTE - Follow Up Consult  Pharmacy Consult for heparin  Indication: chest pain/ACS  No Known Allergies  Patient Measurements: Height: 5\' 9"  (175.3 cm) Weight: 229 lb 12.8 oz (104.237 kg) IBW/kg (Calculated) : 70.7 Heparin Dosing Weight: 93 kg  Vital Signs: Temp: 98.1 F (36.7 C) (09/22 1636) Temp src: Oral (09/22 1636) BP: 148/87 mmHg (09/22 1636) Pulse Rate: 75 (09/22 1636)  Labs:  Recent Labs  11/05/13 2117  11/06/13 0300  11/06/13 2102 11/07/13 0245 11/07/13 0248 11/08/13 0240  HGB  --   < > 14.1  --   --   --  13.8 13.6  HCT  --   --  41.2  --   --   --  41.2 40.6  PLT  --   --  227  --   --   --  229 227  HEPARINUNFRC  --   < >  --   < > 0.51 0.52  --  0.57  CREATININE  --   --  0.69  --   --   --   --   --   TROPONINI 5.51*  --   --   --   --   --   --   --   < > = values in this interval not displayed.  Estimated Creatinine Clearance: 131.4 ml/min (by C-G formula based on Cr of 0.69).   Medications:  Scheduled:  . [START ON 11/09/2013] aminocaproic acid (AMICAR) for OHS   Intravenous To OR  . amLODipine  5 mg Oral Daily  . antiseptic oral rinse  7 mL Mouth Rinse BID  . aspirin EC  325 mg Oral Daily  . atorvastatin  40 mg Oral Daily  . bisacodyl  5 mg Oral Once  . carvedilol  3.125 mg Oral BID WC  . [START ON 11/09/2013] cefUROXime (ZINACEF)  IV  1.5 g Intravenous To OR  . [START ON 11/09/2013] cefUROXime (ZINACEF)  IV  750 mg Intravenous To OR  . chlorhexidine  60 mL Topical Once   And  . [START ON 11/09/2013] chlorhexidine  60 mL Topical Once  . [START ON 11/09/2013] dexmedetomidine  0.1-0.7 mcg/kg/hr Intravenous To OR  . [START ON 11/09/2013] diazepam  5 mg Oral Once  . [START ON 11/09/2013] DOPamine  2-20 mcg/kg/min Intravenous To OR  . [START ON 11/09/2013] epinephrine  0.5-20 mcg/min Intravenous To OR  . [START ON 11/09/2013] heparin-papaverine-plasmalyte irrigation   Irrigation To OR  . [START ON 11/09/2013] heparin 30,000  units/NS 1000 mL solution for CELLSAVER   Other To OR  . insulin aspart  0-20 Units Subcutaneous TID WC  . insulin aspart  0-5 Units Subcutaneous QHS  . insulin aspart  5 Units Subcutaneous TID WC  . insulin detemir  20 Units Subcutaneous QHS  . [START ON 11/09/2013] insulin (NOVOLIN-R) infusion   Intravenous To OR  . [START ON 11/09/2013] magnesium sulfate  40 mEq Other To OR  . metFORMIN  1,000 mg Oral BID WC  . [START ON 11/09/2013] metoprolol tartrate  12.5 mg Oral Once  . [START ON 11/09/2013] nitroGLYCERIN  2-200 mcg/min Intravenous To OR  . pantoprazole  20 mg Oral Daily  . [START ON 11/09/2013] phenylephrine (NEO-SYNEPHRINE) Adult infusion  30-200 mcg/min Intravenous To OR  . [START ON 11/09/2013] potassium chloride  80 mEq Other To OR  . [START ON 11/09/2013] vancomycin  1,250 mg Intravenous To OR   Infusions:  . amiodarone 30 mg/hr (11/08/13 0800)  .  heparin 2,000 Units/hr (11/08/13 0913)  . nitroGLYCERIN Stopped (11/06/13 0600)    Assessment: 48 yo m admitted on 9/18 as code STEMI. Patient found w/ severe 3 vessel disease, for CABG tomorrow. Pharmacy is consulted to dose and adjust heparin. HL this AM was therapeutic at 0.57 on 2,000 units/hr. Will continue heparin infusion at 2,000 units/hr. Daily HL with AM labs. Hgb 13.6, plts 227, no s/s of bleeding.  Goal of Therapy:  Heparin level 0.3-0.7 units/ml Monitor platelets by anticoagulation protocol: Yes   Plan:  Continue heparin infusion at 2,000 units/hr Daily HL with AM labs Monitor hgb/plts, s/s of bleeding, clinical course  Cassie L. Nicole Kindred, PharmD Clinical Pharmacy Resident Pager: [phone] 11/08/2013 5:06 PM

## 2013-11-08 NOTE — Progress Notes (Addendum)
Pre-op Cardiac Surgery  Carotid Findings:  Bilateral:  1-39% ICA stenosis.  Vertebral artery flow is antegrade.    Landry Mellow, RDMS, RVT 11/08/2013     Upper Extremity Right Left  Brachial Pressures 140 Triphasic 159 Triphasic  Radial Waveforms Triphasic Triphasic  Ulnar Waveforms Triphasic Triphasic  Palmar Arch (Allen's Test) Normal Normal   Findings:  Doppler waveforms remained normal bilaterally with both radial and ulnar compressions.    Lower  Extremity Right Left  Dorsalis Pedis 181 Triphasic 183 Biphasic  Posterior Tibial 188 Triphasic 183 Triphasic  Ankle/Brachial Indices 1.18 1.15    Findings:  ABIs and Doppler waveforms are within normal limits bilaterally at rest.  Toma Copier, [place] 11/08/2013, 5:37 PM

## 2013-11-08 NOTE — Progress Notes (Signed)
Inpatient Diabetes Program Recommendations  AACE/ADA: New Consensus Statement on Inpatient Glycemic Control (2013)  Target Ranges:  Prepandial:   less than 140 mg/dL      Peak postprandial:   less than 180 mg/dL (1-2 hours)      Critically ill patients:  140 - 180 mg/dL   Inpatient Diabetes Program Recommendations Diet: add carb modified to current heart healthy diet Thank you  Darek Eifler BSN, RN,CDE Inpatient Diabetes Coordinator [phone] (team pager)  

## 2013-11-08 NOTE — Progress Notes (Addendum)
2 Days Post-Op Procedure(s) (LRB): CARDIOVERSION (N/A) Subjective: No complaints Appropriately anxious about surgery tomorrow  Objective: Vital signs in last 24 hours: Temp:  [97.9 F (36.6 C)-98.4 F (36.9 C)] 98.1 F (36.7 C) (09/22 1636) Pulse Rate:  [75-81] 75 (09/22 1636) Cardiac Rhythm:  [-]  Resp:  [20] 20 (09/22 0800) BP: (138-162)/(79-99) 148/87 mmHg (09/22 1636) SpO2:  [95 %-98 %] 96 % (09/22 1636)  Hemodynamic parameters for last 24 hours:    Intake/Output from previous day: 09/21 0701 - 09/22 0700 In: 2099.6 [P.O.:1080; I.V.:1019.6] Out: 1800 [Urine:1800] Intake/Output this shift: Total I/O In: 1003.7 [P.O.:600; I.V.:403.7] Out: -   General appearance: alert, cooperative and no distress  Lab Results:  Recent Labs  11/07/13 0248 11/08/13 0240  WBC 10.0 8.5  HGB 13.8 13.6  HCT 41.2 40.6  PLT 229 227   BMET:  Recent Labs  11/06/13 0300  NA 132*  K 3.8  CL 95*  CO2 21  GLUCOSE 272*  BUN 8  CREATININE 0.69  CALCIUM 8.1*    PT/INR: No results found for this basename: LABPROT, INR,  in the last 72 hours ABG No results found for this basename: phart, pco2, po2, hco3, tco2, acidbasedef, o2sat   CBG (last 3)   Recent Labs  11/08/13 0753 11/08/13 1316 11/08/13 1735  GLUCAP 157* 201* 202*    Assessment/Plan: S/P Procedure(s) (LRB): CARDIOVERSION (N/A) For CABG in AM Carotids OK, ABI WNL PFT approximately 80% predicted All questions answered   LOS: 4 days    Metha Kolasa C 11/08/2013  CBG better controlled

## 2013-11-08 NOTE — Progress Notes (Signed)
PROGRESS NOTE  Subjective:   Shane Frye was admitted with HTN, severe, CP,  ST elevation. At cath he was found to have severe 3 V CAD UDS was Positive for cocaine    he had atrial flutter yesterday and  was cardioverted Sunday    Comfortable.  Sitting in chair.    amio IV is still going    Objective:    Vital Signs:   Temp:  [98 F (36.7 C)-98.4 F (36.9 C)] 98.4 F (36.9 C) (09/22 0755) Pulse Rate:  [77-81] 78 (09/22 0344) Resp:  [20] 20 (09/22 0344) BP: (137-162)/(79-99) 162/99 mmHg (09/22 0344) SpO2:  [95 %-98 %] 96 % (09/22 0755)  Last BM Date: 11/07/13   24-hour weight change: Weight change:   Weight trends: Filed Weights   11/04/13 1800 11/05/13 0345  Weight: 230 lb 13.2 oz (104.7 kg) 229 lb 12.8 oz (104.237 kg)    Intake/Output:  09/21 0701 - 09/22 0700 In: 2099.6 [P.O.:1080; I.V.:1019.6] Out: 1800 [Urine:1800] Total I/O In: 36.7 [I.V.:36.7] Out: -    Physical Exam: BP 162/99  Pulse 78  Temp(Src) 98.4 F (36.9 C) (Oral)  Resp 20  Ht 5\' 9"  (1.753 m)  Wt 229 lb 12.8 oz (104.237 kg)  BMI 33.92 kg/m2  SpO2 96%  Wt Readings from Last 3 Encounters:  11/05/13 229 lb 12.8 oz (104.237 kg)  11/05/13 229 lb 12.8 oz (104.237 kg)  11/05/13 229 lb 12.8 oz (104.237 kg)    General: Vital signs reviewed and noted.   Head: Normocephalic, atraumatic.  Eyes: conjunctivae/corneas clear.  EOM's intact.   Throat: normal  Neck:  normal   Lungs:  clear  Heart:  RR  NSR  Abdomen:  Soft, non-tender, non-distended    Extremities: No edema,     cath site is ok  Neurologic: A&O X3, CN II - XII are grossly intact.   Psych: Normal     Labs: BMET:  Recent Labs  11/06/13 0300  NA 132*  K 3.8  CL 95*  CO2 21  GLUCOSE 272*  BUN 8  CREATININE 0.69  CALCIUM 8.1*  MG 1.8    Liver function tests: No results found for this basename: AST, ALT, ALKPHOS, BILITOT, PROT, ALBUMIN,  in the last 72 hours No results found for this basename: LIPASE, AMYLASE,  in the  last 72 hours  CBC:  Recent Labs  11/07/13 0248 11/08/13 0240  WBC 10.0 8.5  HGB 13.8 13.6  HCT 41.2 40.6  MCV 86.6 86.6  PLT 229 227    Cardiac Enzymes:  Recent Labs  11/05/13 1015 11/05/13 1600 11/05/13 2117  TROPONINI 7.42* 7.61* 5.51*    Coagulation Studies: No results found for this basename: LABPROT, INR,  in the last 72 hours  Other: No components found with this basename: POCBNP,  No results found for this basename: DDIMER,  in the last 72 hours No results found for this basename: HGBA1C,  in the last 72 hours  Recent Labs  11/06/13 0300  CHOL 149  HDL 44  LDLCALC 66  TRIG 196*  CHOLHDL 3.4    Recent Labs  11/06/13 0330  TSH 2.560   No results found for this basename: VITAMINB12, FOLATE, FERRITIN, TIBC, IRON, RETICCTPCT,  in the last 72 hours   Other results:  EKG : from EMS  NSR, TWI in inf. And lateral leads.  Repeat ECG has been ordered.     Medications:    Infusions: . amiodarone 30 mg/hr (11/08/13  8921)  . heparin 2,000 Units/hr (11/07/13 2053)  . nitroGLYCERIN Stopped (11/06/13 0600)    Scheduled Medications: . amLODipine  5 mg Oral Daily  . antiseptic oral rinse  7 mL Mouth Rinse BID  . aspirin EC  325 mg Oral Daily  . atorvastatin  40 mg Oral Daily  . carvedilol  3.125 mg Oral BID WC  . insulin aspart  0-20 Units Subcutaneous TID WC  . insulin aspart  0-5 Units Subcutaneous QHS  . insulin aspart  5 Units Subcutaneous TID WC  . insulin detemir  20 Units Subcutaneous QHS  . metFORMIN  1,000 mg Oral BID WC  . pantoprazole  20 mg Oral Daily    Assessment/ Plan:   Active Problems:   STEMI (ST elevation myocardial infarction)   HTN (hypertension)   DM (diabetes mellitus)   HLD (hyperlipidemia)   Obesity   Cocaine abuse   H/O noncompliance with medical treatment, presenting hazards to health   CAD (coronary artery disease), native coronary artery  1. CAD:  Severe 3 V CAD.   Stable.  For CABG possibly Wednesday   2.  HTN:   BP has improved.   3. DM.  HbA1C is > 9.  He is back on medications   4. Atrial flutter:   - s/p cardioversion Cont. amio for now.    Disposition:   Length of Stay: 4  Thayer Headings, Brooke Bonito., MD, [facility] 11/08/2013, 8:59 AM Office [phone] Pager [phone]

## 2013-11-09 ENCOUNTER — Inpatient Hospital Stay (HOSPITAL_COMMUNITY): Payer: Medicaid Other | Admitting: Certified Registered Nurse Anesthetist

## 2013-11-09 ENCOUNTER — Inpatient Hospital Stay (HOSPITAL_COMMUNITY): Payer: Medicaid Other

## 2013-11-09 ENCOUNTER — Encounter (HOSPITAL_COMMUNITY): Payer: Medicaid Other | Admitting: Certified Registered Nurse Anesthetist

## 2013-11-09 ENCOUNTER — Encounter (HOSPITAL_COMMUNITY): Payer: Self-pay | Admitting: Certified Registered"

## 2013-11-09 ENCOUNTER — Encounter (HOSPITAL_COMMUNITY)
Admission: RE | Disposition: A | Discharge status: Home / Self Care | Payer: Medicaid Other | Source: Other Acute Inpatient Hospital | Attending: Thoracic Surgery (Cardiothoracic Vascular Surgery)

## 2013-11-09 DIAGNOSIS — Z951 Presence of aortocoronary bypass graft: Secondary | ICD-10-CM

## 2013-11-09 HISTORY — PX: INTRAOPERATIVE TRANSESOPHAGEAL ECHOCARDIOGRAM: SHX5062

## 2013-11-09 HISTORY — PX: CORONARY ARTERY BYPASS GRAFT: SHX141

## 2013-11-09 LAB — POCT I-STAT, CHEM 8
BUN: 7 mg/dL (ref 6–23)
BUN: 7 mg/dL (ref 6–23)
BUN: 8 mg/dL (ref 6–23)
BUN: 7 mg/dL (ref 6–23)
BUN: 7 mg/dL (ref 6–23)
BUN: 8 mg/dL (ref 6–23)
CALCIUM ION: 1.2 mmol/L (ref 1.12–1.23)
CALCIUM ION: 1.12 mmol/L (ref 1.12–1.23)
CHLORIDE: 101 meq/L (ref 96–112)
CREATININE: 0.6 mg/dL (ref 0.50–1.35)
CREATININE: 0.8 mg/dL (ref 0.50–1.35)
Calcium, Ion: 1.2 mmol/L (ref 1.12–1.23)
Calcium, Ion: 1.11 mmol/L — ABNORMAL LOW (ref 1.12–1.23)
Calcium, Ion: 1.13 mmol/L (ref 1.12–1.23)
Calcium, Ion: 1.17 mmol/L (ref 1.12–1.23)
Chloride: 102 mEq/L (ref 96–112)
Chloride: 99 mEq/L (ref 96–112)
Chloride: 101 mEq/L (ref 96–112)
Chloride: 101 mEq/L (ref 96–112)
Chloride: 105 mEq/L (ref 96–112)
Creatinine, Ser: 0.7 mg/dL (ref 0.50–1.35)
Creatinine, Ser: 0.7 mg/dL (ref 0.50–1.35)
Creatinine, Ser: 0.7 mg/dL (ref 0.50–1.35)
Creatinine, Ser: 0.7 mg/dL (ref 0.50–1.35)
GLUCOSE: 152 mg/dL — AB (ref 70–99)
Glucose, Bld: 162 mg/dL — ABNORMAL HIGH (ref 70–99)
Glucose, Bld: 142 mg/dL — ABNORMAL HIGH (ref 70–99)
Glucose, Bld: 130 mg/dL — ABNORMAL HIGH (ref 70–99)
Glucose, Bld: 162 mg/dL — ABNORMAL HIGH (ref 70–99)
Glucose, Bld: 137 mg/dL — ABNORMAL HIGH (ref 70–99)
HCT: 40 % (ref 39.0–52.0)
HCT: 33 % — ABNORMAL LOW (ref 39.0–52.0)
HCT: 39 % (ref 39.0–52.0)
HEMATOCRIT: 42 % (ref 39.0–52.0)
HEMATOCRIT: 34 % — AB (ref 39.0–52.0)
HEMATOCRIT: 35 % — AB (ref 39.0–52.0)
HEMOGLOBIN: 14.3 g/dL (ref 13.0–17.0)
HEMOGLOBIN: 11.6 g/dL — AB (ref 13.0–17.0)
HEMOGLOBIN: 11.2 g/dL — AB (ref 13.0–17.0)
HEMOGLOBIN: 11.9 g/dL — AB (ref 13.0–17.0)
HEMOGLOBIN: 13.3 g/dL (ref 13.0–17.0)
Hemoglobin: 13.6 g/dL (ref 13.0–17.0)
POTASSIUM: 3.8 meq/L (ref 3.7–5.3)
POTASSIUM: 4.5 meq/L (ref 3.7–5.3)
Potassium: 3.7 mEq/L (ref 3.7–5.3)
Potassium: 3.7 mEq/L (ref 3.7–5.3)
Potassium: 4 mEq/L (ref 3.7–5.3)
Potassium: 4.8 mEq/L (ref 3.7–5.3)
SODIUM: 137 meq/L (ref 137–147)
SODIUM: 134 meq/L — AB (ref 137–147)
SODIUM: 133 meq/L — AB (ref 137–147)
SODIUM: 136 meq/L — AB (ref 137–147)
Sodium: 137 mEq/L (ref 137–147)
Sodium: 138 mEq/L (ref 137–147)
TCO2: 27 mmol/L (ref 0–100)
TCO2: 25 mmol/L (ref 0–100)
TCO2: 24 mmol/L (ref 0–100)
TCO2: 24 mmol/L (ref 0–100)
TCO2: 24 mmol/L (ref 0–100)
TCO2: 21 mmol/L (ref 0–100)

## 2013-11-09 LAB — GLUCOSE, CAPILLARY
GLUCOSE-CAPILLARY: 112 mg/dL — AB (ref 70–99)
Glucose-Capillary: 127 mg/dL — ABNORMAL HIGH (ref 70–99)
Glucose-Capillary: 107 mg/dL — ABNORMAL HIGH (ref 70–99)
Glucose-Capillary: 123 mg/dL — ABNORMAL HIGH (ref 70–99)
Glucose-Capillary: 104 mg/dL — ABNORMAL HIGH (ref 70–99)

## 2013-11-09 LAB — POCT I-STAT GLUCOSE
GLUCOSE: 158 mg/dL — AB (ref 70–99)
Glucose, Bld: 134 mg/dL — ABNORMAL HIGH (ref 70–99)
Operator id: 3390
Operator id: 3390

## 2013-11-09 LAB — POCT I-STAT 3, ART BLOOD GAS (G3+)
ACID-BASE DEFICIT: 1 mmol/L (ref 0.0–2.0)
ACID-BASE DEFICIT: 2 mmol/L (ref 0.0–2.0)
ACID-BASE EXCESS: 1 mmol/L (ref 0.0–2.0)
BICARBONATE: 25.3 meq/L — AB (ref 20.0–24.0)
BICARBONATE: 23 meq/L (ref 20.0–24.0)
Bicarbonate: 25.7 mEq/L — ABNORMAL HIGH (ref 20.0–24.0)
Bicarbonate: 25.8 mEq/L — ABNORMAL HIGH (ref 20.0–24.0)
O2 SAT: 100 %
O2 SAT: 98 %
O2 Saturation: 95 %
O2 Saturation: 89 %
PCO2 ART: 44.2 mmHg (ref 35.0–45.0)
PCO2 ART: 46.9 mmHg — AB (ref 35.0–45.0)
PO2 ART: 77 mmHg — AB (ref 80.0–100.0)
Patient temperature: 34
Patient temperature: 37
Patient temperature: 36.5
TCO2: 27 mmol/L (ref 0–100)
TCO2: 27 mmol/L (ref 0–100)
TCO2: 27 mmol/L (ref 0–100)
TCO2: 24 mmol/L (ref 0–100)
pCO2 arterial: 34.6 mmHg — ABNORMAL LOW (ref 35.0–45.0)
pCO2 arterial: 42.1 mmHg (ref 35.0–45.0)
pH, Arterial: 7.466 — ABNORMAL HIGH (ref 7.350–7.450)
pH, Arterial: 7.375 (ref 7.350–7.450)
pH, Arterial: 7.337 — ABNORMAL LOW (ref 7.350–7.450)
pH, Arterial: 7.348 — ABNORMAL LOW (ref 7.350–7.450)
pO2, Arterial: 340 mmHg — ABNORMAL HIGH (ref 80.0–100.0)
pO2, Arterial: 109 mmHg — ABNORMAL HIGH (ref 80.0–100.0)
pO2, Arterial: 62 mmHg — ABNORMAL LOW (ref 80.0–100.0)

## 2013-11-09 LAB — HEMOGLOBIN AND HEMATOCRIT, BLOOD
HCT: 32.3 % — ABNORMAL LOW (ref 39.0–52.0)
Hemoglobin: 10.9 g/dL — ABNORMAL LOW (ref 13.0–17.0)

## 2013-11-09 LAB — BLOOD GAS, ARTERIAL
Acid-Base Excess: 1.8 mmol/L (ref 0.0–2.0)
Bicarbonate: 25.8 meq/L — ABNORMAL HIGH (ref 20.0–24.0)
Drawn by: 232811
FIO2: 0.21 %
O2 Saturation: 96.2 %
Patient temperature: 98
TCO2: 27.1 mmol/L (ref 0–100)
pCO2 arterial: 40.1 mmHg (ref 35.0–45.0)
pH, Arterial: 7.424 (ref 7.350–7.450)
pO2, Arterial: 79.1 mmHg — ABNORMAL LOW (ref 80.0–100.0)

## 2013-11-09 LAB — CBC
HCT: 40.7 % (ref 39.0–52.0)
HCT: 37.4 % — ABNORMAL LOW (ref 39.0–52.0)
HEMOGLOBIN: 13.5 g/dL (ref 13.0–17.0)
Hemoglobin: 12.6 g/dL — ABNORMAL LOW (ref 13.0–17.0)
MCH: 28.6 pg (ref 26.0–34.0)
MCH: 29 pg (ref 26.0–34.0)
MCHC: 33.2 g/dL (ref 30.0–36.0)
MCHC: 33.7 g/dL (ref 30.0–36.0)
MCV: 86.2 fL (ref 78.0–100.0)
MCV: 86.2 fL (ref 78.0–100.0)
PLATELETS: 200 10*3/uL (ref 150–400)
Platelets: 183 10*3/uL (ref 150–400)
RBC: 4.72 MIL/uL (ref 4.22–5.81)
RBC: 4.34 MIL/uL (ref 4.22–5.81)
RDW: 13.3 % (ref 11.5–15.5)
RDW: 13.3 % (ref 11.5–15.5)
WBC: 13.8 10*3/uL — AB (ref 4.0–10.5)
WBC: 11.4 10*3/uL — ABNORMAL HIGH (ref 4.0–10.5)

## 2013-11-09 LAB — PROTIME-INR
INR: 1.28 (ref 0.00–1.49)
PROTHROMBIN TIME: 16 s — AB (ref 11.6–15.2)

## 2013-11-09 LAB — CREATININE, SERUM
Creatinine, Ser: 0.75 mg/dL (ref 0.50–1.35)
GFR calc Af Amer: >90 mL/min (ref >90)
GFR calc non Af Amer: >90 mL/min (ref >90)

## 2013-11-09 LAB — MAGNESIUM: MAGNESIUM: 2.7 mg/dL — AB (ref 1.5–2.5)

## 2013-11-09 LAB — APTT: APTT: 35 s (ref 24–37)

## 2013-11-09 LAB — POCT I-STAT 3, VENOUS BLOOD GAS (G3P V)
ACID-BASE DEFICIT: 1 mmol/L (ref 0.0–2.0)
BICARBONATE: 24.6 meq/L — AB (ref 20.0–24.0)
O2 Saturation: 75 %
Patient temperature: 34
TCO2: 26 mmol/L (ref 0–100)
pCO2, Ven: 36.8 mmHg — ABNORMAL LOW (ref 45.0–50.0)
pH, Ven: 7.42 — ABNORMAL HIGH (ref 7.250–7.300)
pO2, Ven: 33 mmHg (ref 30.0–45.0)

## 2013-11-09 LAB — POCT I-STAT 4, (NA,K, GLUC, HGB,HCT)
GLUCOSE: 133 mg/dL — AB (ref 70–99)
HEMATOCRIT: 40 % (ref 39.0–52.0)
Hemoglobin: 13.6 g/dL (ref 13.0–17.0)
POTASSIUM: 3.8 meq/L (ref 3.7–5.3)
Sodium: 140 mEq/L (ref 137–147)

## 2013-11-09 LAB — PLATELET COUNT: Platelets: 176 10*3/uL (ref 150–400)

## 2013-11-09 SURGERY — CORONARY ARTERY BYPASS GRAFTING (CABG)
Anesthesia: General | Site: Chest

## 2013-11-09 MED ORDER — SODIUM CHLORIDE 0.9 % IV SOLN: 250.0000 mL | INTRAVENOUS | Status: DC

## 2013-11-09 MED ORDER — PROTAMINE SULFATE 10 MG/ML IV SOLN
INTRAVENOUS | Status: AC
  Filled 2013-11-09: qty 25

## 2013-11-09 MED ORDER — DEXMEDETOMIDINE HCL IN NACL 200 MCG/50ML IV SOLN
0.1000 ug/kg/h | INTRAVENOUS | Status: DC
  Administered 2013-11-09 – 2013-11-10 (×2): 0.5 ug/kg/h via INTRAVENOUS
  Filled 2013-11-09 (×3): qty 50

## 2013-11-09 MED ORDER — HEPARIN SODIUM (PORCINE) 1000 UNIT/ML IJ SOLN
INTRAMUSCULAR | Status: AC
  Filled 2013-11-09: qty 1

## 2013-11-09 MED ORDER — STERILE WATER FOR INJECTION IJ SOLN
INTRAMUSCULAR | Status: AC
  Filled 2013-11-09: qty 10

## 2013-11-09 MED ORDER — VECURONIUM BROMIDE 10 MG IV SOLR
INTRAVENOUS | Status: AC
  Filled 2013-11-09: qty 10

## 2013-11-09 MED ORDER — MORPHINE SULFATE 2 MG/ML IJ SOLN
2.0000 mg | INTRAMUSCULAR | Status: DC | PRN
  Administered 2013-11-09 – 2013-11-11 (×10): 4 mg via INTRAVENOUS
  Filled 2013-11-09 (×10): qty 2

## 2013-11-09 MED ORDER — ONDANSETRON HCL 4 MG/2ML IJ SOLN
4.0000 mg | Freq: Four times a day (QID) | INTRAMUSCULAR | Status: DC | PRN
  Administered 2013-11-13: 4 mg via INTRAVENOUS
  Filled 2013-11-09: qty 2

## 2013-11-09 MED ORDER — FENTANYL CITRATE 0.05 MG/ML IJ SOLN
INTRAMUSCULAR | Status: AC
  Filled 2013-11-09: qty 5

## 2013-11-09 MED ORDER — ACETAMINOPHEN 160 MG/5ML PO SOLN
1000.0000 mg | Freq: Four times a day (QID) | ORAL | Status: AC
  Filled 2013-11-09: qty 40

## 2013-11-09 MED ORDER — LEVALBUTEROL HCL 0.63 MG/3ML IN NEBU
0.6300 mg | INHALATION_SOLUTION | Freq: Four times a day (QID) | RESPIRATORY_TRACT | Status: DC
Start: 2013-11-09 — End: 2013-11-10
  Administered 2013-11-10 (×3): 0.63 mg via RESPIRATORY_TRACT
  Filled 2013-11-09 (×8): qty 3

## 2013-11-09 MED ORDER — SODIUM CHLORIDE 0.9 % IJ SOLN
3.0000 mL | INTRAMUSCULAR | Status: DC | PRN
Start: 2013-11-10 — End: 2013-11-11

## 2013-11-09 MED ORDER — SODIUM CHLORIDE 0.9 % IV SOLN
INTRAVENOUS | Status: DC
  Filled 2013-11-09 (×2): qty 2.5

## 2013-11-09 MED ORDER — ASPIRIN 81 MG PO CHEW
324.0000 mg | CHEWABLE_TABLET | Freq: Every day | ORAL | Status: DC
  Filled 2013-11-09 (×2): qty 4

## 2013-11-09 MED ORDER — ALBUMIN HUMAN 5 % IV SOLN
250.0000 mL | INTRAVENOUS | Status: AC | PRN
  Administered 2013-11-09 (×2): 250 mL via INTRAVENOUS

## 2013-11-09 MED ORDER — METOPROLOL TARTRATE 25 MG/10 ML ORAL SUSPENSION
12.5000 mg | Freq: Two times a day (BID) | ORAL | Status: DC
  Filled 2013-11-09 (×5): qty 5

## 2013-11-09 MED ORDER — DOPAMINE-DEXTROSE 3.2-5 MG/ML-% IV SOLN: 0.0000 ug/kg/min | INTRAVENOUS | Status: DC

## 2013-11-09 MED ORDER — MIDAZOLAM HCL 5 MG/5ML IJ SOLN
INTRAMUSCULAR | Status: DC | PRN
  Administered 2013-11-09: 4 mg via INTRAVENOUS
  Administered 2013-11-09: 2 mg via INTRAVENOUS
  Administered 2013-11-09: 4 mg via INTRAVENOUS
  Administered 2013-11-09: 2 mg via INTRAVENOUS
  Administered 2013-11-09: 1 mg via INTRAVENOUS
  Administered 2013-11-09: 2 mg via INTRAVENOUS
  Administered 2013-11-09: 1 mg via INTRAVENOUS

## 2013-11-09 MED ORDER — ROCURONIUM BROMIDE 50 MG/5ML IV SOLN
INTRAVENOUS | Status: AC
  Filled 2013-11-09: qty 1

## 2013-11-09 MED ORDER — FENTANYL CITRATE 0.05 MG/ML IJ SOLN
INTRAMUSCULAR | Status: DC | PRN
  Administered 2013-11-09: 250 ug via INTRAVENOUS
  Administered 2013-11-09: 150 ug via INTRAVENOUS
  Administered 2013-11-09 (×2): 100 ug via INTRAVENOUS
  Administered 2013-11-09: 250 ug via INTRAVENOUS
  Administered 2013-11-09: 50 ug via INTRAVENOUS
  Administered 2013-11-09: 150 ug via INTRAVENOUS
  Administered 2013-11-09: 100 ug via INTRAVENOUS
  Administered 2013-11-09: 50 ug via INTRAVENOUS
  Administered 2013-11-09: 300 ug via INTRAVENOUS

## 2013-11-09 MED ORDER — HEMOSTATIC AGENTS (NO CHARGE) OPTIME
TOPICAL | Status: DC | PRN
  Administered 2013-11-09: 1 via TOPICAL

## 2013-11-09 MED ORDER — SODIUM CHLORIDE 0.9 % IJ SOLN
INTRAMUSCULAR | Status: AC
  Filled 2013-11-09: qty 10

## 2013-11-09 MED ORDER — OXYCODONE HCL 5 MG PO TABS
5.0000 mg | ORAL_TABLET | ORAL | Status: DC | PRN
  Administered 2013-11-10 – 2013-11-15 (×27): 10 mg via ORAL
  Filled 2013-11-09 (×29): qty 2

## 2013-11-09 MED ORDER — VECURONIUM BROMIDE 10 MG IV SOLR
INTRAVENOUS | Status: DC | PRN
  Administered 2013-11-09 (×4): 5 mg via INTRAVENOUS

## 2013-11-09 MED ORDER — PROPOFOL 10 MG/ML IV BOLUS
INTRAVENOUS | Status: DC | PRN
  Administered 2013-11-09: 100 mg via INTRAVENOUS

## 2013-11-09 MED ORDER — DEXTROSE 5 % IV SOLN
1.5000 g | Freq: Two times a day (BID) | INTRAVENOUS | Status: AC
  Administered 2013-11-09 – 2013-11-11 (×4): 1.5 g via INTRAVENOUS
  Filled 2013-11-09 (×4): qty 1.5

## 2013-11-09 MED ORDER — BISACODYL 10 MG RE SUPP: 10.0000 mg | Freq: Every day | RECTAL | Status: DC

## 2013-11-09 MED ORDER — FAMOTIDINE IN NACL 20-0.9 MG/50ML-% IV SOLN
20.0000 mg | Freq: Two times a day (BID) | INTRAVENOUS | Status: AC
  Administered 2013-11-09 (×2): 20 mg via INTRAVENOUS
  Filled 2013-11-09: qty 50

## 2013-11-09 MED ORDER — MORPHINE SULFATE 2 MG/ML IJ SOLN: 1.0000 mg | INTRAMUSCULAR | Status: AC | PRN

## 2013-11-09 MED ORDER — VANCOMYCIN HCL IN DEXTROSE 1-5 GM/200ML-% IV SOLN
1000.0000 mg | Freq: Once | INTRAVENOUS | Status: AC
  Administered 2013-11-09: 1000 mg via INTRAVENOUS
  Filled 2013-11-09: qty 200

## 2013-11-09 MED ORDER — INSULIN REGULAR BOLUS VIA INFUSION
0.0000 [IU] | Freq: Three times a day (TID) | INTRAVENOUS | Status: DC
  Administered 2013-11-10 (×2): 2 [IU] via INTRAVENOUS
  Filled 2013-11-09: qty 10

## 2013-11-09 MED ORDER — SODIUM CHLORIDE 0.9 % IJ SOLN
OROMUCOSAL | Status: DC | PRN
  Administered 2013-11-09: 11:00:00 via TOPICAL

## 2013-11-09 MED ORDER — MIDAZOLAM HCL 2 MG/2ML IJ SOLN
2.0000 mg | INTRAMUSCULAR | Status: DC | PRN
  Administered 2013-11-09 – 2013-11-10 (×3): 2 mg via INTRAVENOUS
  Filled 2013-11-09 (×3): qty 2

## 2013-11-09 MED ORDER — ACETAMINOPHEN 500 MG PO TABS
1000.0000 mg | ORAL_TABLET | Freq: Four times a day (QID) | ORAL | Status: AC
  Administered 2013-11-10 – 2013-11-14 (×17): 1000 mg via ORAL
  Filled 2013-11-09 (×20): qty 2

## 2013-11-09 MED ORDER — MIDAZOLAM HCL 2 MG/2ML IJ SOLN
INTRAMUSCULAR | Status: AC
  Filled 2013-11-09: qty 2

## 2013-11-09 MED ORDER — NITROGLYCERIN IN D5W 200-5 MCG/ML-% IV SOLN: 0.0000 ug/min | INTRAVENOUS | Status: DC

## 2013-11-09 MED ORDER — ATORVASTATIN CALCIUM 40 MG PO TABS
40.0000 mg | ORAL_TABLET | Freq: Every day | ORAL | Status: DC
  Administered 2013-11-10 – 2013-11-14 (×5): 40 mg via ORAL
  Filled 2013-11-09 (×6): qty 1

## 2013-11-09 MED ORDER — CHLORHEXIDINE GLUCONATE 0.12 % MT SOLN
15.0000 mL | Freq: Two times a day (BID) | OROMUCOSAL | Status: DC
  Administered 2013-11-09 – 2013-11-15 (×9): 15 mL via OROMUCOSAL
  Filled 2013-11-09 (×14): qty 15

## 2013-11-09 MED ORDER — METOPROLOL TARTRATE 12.5 MG HALF TABLET
12.5000 mg | ORAL_TABLET | Freq: Two times a day (BID) | ORAL | Status: DC
  Administered 2013-11-10: 12.5 mg via ORAL
  Filled 2013-11-09 (×5): qty 1

## 2013-11-09 MED ORDER — PROPOFOL 10 MG/ML IV BOLUS
INTRAVENOUS | Status: AC
  Filled 2013-11-09: qty 20

## 2013-11-09 MED ORDER — PROTAMINE SULFATE 10 MG/ML IV SOLN
INTRAVENOUS | Status: DC | PRN
  Administered 2013-11-09 (×3): 50 mg via INTRAVENOUS
  Administered 2013-11-09: 60 mg via INTRAVENOUS
  Administered 2013-11-09 (×3): 50 mg via INTRAVENOUS

## 2013-11-09 MED ORDER — LACTATED RINGERS IV SOLN
INTRAVENOUS | Status: DC | PRN
  Administered 2013-11-09 (×2): via INTRAVENOUS

## 2013-11-09 MED ORDER — LACTATED RINGERS IV SOLN: INTRAVENOUS | Status: DC

## 2013-11-09 MED ORDER — SODIUM CHLORIDE 0.9 % IJ SOLN
3.0000 mL | Freq: Two times a day (BID) | INTRAMUSCULAR | Status: DC
  Administered 2013-11-10 – 2013-11-11 (×2): 3 mL via INTRAVENOUS

## 2013-11-09 MED ORDER — MAGNESIUM SULFATE 4000MG/100ML IJ SOLN
4.0000 g | Freq: Once | INTRAMUSCULAR | Status: AC
  Administered 2013-11-09: 4 g via INTRAVENOUS
  Filled 2013-11-09: qty 100

## 2013-11-09 MED ORDER — METOPROLOL TARTRATE 1 MG/ML IV SOLN: 2.5000 mg | INTRAVENOUS | Status: DC | PRN

## 2013-11-09 MED ORDER — SODIUM CHLORIDE 0.9 % IV SOLN: INTRAVENOUS | Status: DC

## 2013-11-09 MED ORDER — MIDAZOLAM HCL 10 MG/2ML IJ SOLN
INTRAMUSCULAR | Status: AC
  Filled 2013-11-09: qty 2

## 2013-11-09 MED ORDER — DOCUSATE SODIUM 100 MG PO CAPS
200.0000 mg | ORAL_CAPSULE | Freq: Every day | ORAL | Status: DC
Start: 2013-11-10 — End: 2013-11-15
  Administered 2013-11-10 – 2013-11-15 (×6): 200 mg via ORAL
  Filled 2013-11-09 (×7): qty 2

## 2013-11-09 MED ORDER — ROCURONIUM BROMIDE 50 MG/5ML IV SOLN
INTRAVENOUS | Status: AC
  Filled 2013-11-09: qty 2

## 2013-11-09 MED ORDER — PHENYLEPHRINE HCL 10 MG/ML IJ SOLN
0.0000 ug/min | INTRAVENOUS | Status: DC
  Administered 2013-11-10: 25 ug/min via INTRAVENOUS
  Filled 2013-11-09 (×2): qty 2

## 2013-11-09 MED ORDER — 0.9 % SODIUM CHLORIDE (POUR BTL) OPTIME
TOPICAL | Status: DC | PRN
  Administered 2013-11-09: 1000 mL

## 2013-11-09 MED ORDER — ARTIFICIAL TEARS OP OINT
TOPICAL_OINTMENT | OPHTHALMIC | Status: AC
  Filled 2013-11-09: qty 3.5

## 2013-11-09 MED ORDER — ACETAMINOPHEN 650 MG RE SUPP
650.0000 mg | Freq: Once | RECTAL | Status: AC
  Administered 2013-11-09: 650 mg via RECTAL

## 2013-11-09 MED ORDER — CETYLPYRIDINIUM CHLORIDE 0.05 % MT LIQD
7.0000 mL | Freq: Four times a day (QID) | OROMUCOSAL | Status: DC
  Administered 2013-11-09 – 2013-11-13 (×11): 7 mL via OROMUCOSAL

## 2013-11-09 MED ORDER — ROCURONIUM BROMIDE 100 MG/10ML IV SOLN
INTRAVENOUS | Status: DC | PRN
  Administered 2013-11-09 (×3): 50 mg via INTRAVENOUS

## 2013-11-09 MED ORDER — POTASSIUM CHLORIDE 10 MEQ/50ML IV SOLN
10.0000 meq | INTRAVENOUS | Status: AC
  Administered 2013-11-09 (×3): 10 meq via INTRAVENOUS

## 2013-11-09 MED ORDER — HEPARIN SODIUM (PORCINE) 1000 UNIT/ML IJ SOLN
INTRAMUSCULAR | Status: DC | PRN
  Administered 2013-11-09: 36000 [IU] via INTRAVENOUS
  Administered 2013-11-09: 2000 [IU] via INTRAVENOUS

## 2013-11-09 MED ORDER — BISACODYL 5 MG PO TBEC
10.0000 mg | DELAYED_RELEASE_TABLET | Freq: Every day | ORAL | Status: DC
  Administered 2013-11-10 – 2013-11-15 (×3): 10 mg via ORAL
  Filled 2013-11-09 (×3): qty 2

## 2013-11-09 MED ORDER — PANTOPRAZOLE SODIUM 40 MG PO TBEC
40.0000 mg | DELAYED_RELEASE_TABLET | Freq: Every day | ORAL | Status: DC
  Administered 2013-11-11 – 2013-11-15 (×5): 40 mg via ORAL
  Filled 2013-11-09 (×5): qty 1

## 2013-11-09 MED ORDER — EPHEDRINE SULFATE 50 MG/ML IJ SOLN
INTRAMUSCULAR | Status: AC
  Filled 2013-11-09: qty 1

## 2013-11-09 MED ORDER — ARTIFICIAL TEARS OP OINT
TOPICAL_OINTMENT | OPHTHALMIC | Status: DC | PRN
  Administered 2013-11-09: 1 via OPHTHALMIC

## 2013-11-09 MED ORDER — ASPIRIN EC 325 MG PO TBEC
325.0000 mg | DELAYED_RELEASE_TABLET | Freq: Every day | ORAL | Status: DC
  Administered 2013-11-10 – 2013-11-15 (×5): 325 mg via ORAL
  Filled 2013-11-09 (×6): qty 1

## 2013-11-09 MED ORDER — ACETAMINOPHEN 160 MG/5ML PO SOLN
650.0000 mg | Freq: Once | ORAL | Status: AC
Start: 2013-11-09 — End: 2013-11-09

## 2013-11-09 MED ORDER — SODIUM CHLORIDE 0.45 % IV SOLN
INTRAVENOUS | Status: DC
  Administered 2013-11-09: 15:00:00 via INTRAVENOUS

## 2013-11-09 MED ORDER — POTASSIUM CHLORIDE 10 MEQ/50ML IV SOLN: 10.0000 meq | Freq: Once | INTRAVENOUS | Status: DC

## 2013-11-09 MED ORDER — LACTATED RINGERS IV SOLN: 500.0000 mL | Freq: Once | INTRAVENOUS | Status: AC | PRN

## 2013-11-09 MED ORDER — SODIUM CHLORIDE 0.9 % IV SOLN
INTRAVENOUS | Status: DC | PRN
  Administered 2013-11-09: 14:00:00 via INTRAVENOUS

## 2013-11-09 SURGICAL SUPPLY — 92 items
ATTRACTOMAT 16X20 MAGNETIC DRP (DRAPES) ×4 IMPLANT
BAG DECANTER FOR FLEXI CONT (MISCELLANEOUS) ×4 IMPLANT
BANDAGE ELASTIC 4 VELCRO ST LF (GAUZE/BANDAGES/DRESSINGS) ×4 IMPLANT
BANDAGE ELASTIC 6 VELCRO ST LF (GAUZE/BANDAGES/DRESSINGS) ×4 IMPLANT
BASKET HEART  (ORDER IN 25'S) (MISCELLANEOUS) ×1
BASKET HEART (ORDER IN 25'S) (MISCELLANEOUS) ×1
BASKET HEART (ORDER IN 25S) (MISCELLANEOUS) ×2 IMPLANT
BLADE STERNUM SYSTEM 6 (BLADE) ×4 IMPLANT
BNDG GAUZE ELAST 4 BULKY (GAUZE/BANDAGES/DRESSINGS) ×4 IMPLANT
CANISTER SUCTION 2500CC (MISCELLANEOUS) ×4 IMPLANT
CANNULA EZ GLIDE AORTIC 21FR (CANNULA) ×4 IMPLANT
CARDIAC SUCTION (MISCELLANEOUS) ×4 IMPLANT
CATH CPB KIT HENDRICKSON (MISCELLANEOUS) ×4 IMPLANT
CATH ROBINSON RED A/P 18FR (CATHETERS) ×4 IMPLANT
CATH THORACIC 36FR (CATHETERS) ×4 IMPLANT
CATH THORACIC 36FR RT ANG (CATHETERS) ×4 IMPLANT
CLIP FOGARTY SPRING 6M (CLIP) ×4 IMPLANT
CLIP RETRACTION 3.0MM CORONARY (MISCELLANEOUS) ×4 IMPLANT
CLIP TI MEDIUM 24 (CLIP) IMPLANT
CLIP TI WIDE RED SMALL 24 (CLIP) ×8 IMPLANT
COVER SURGICAL LIGHT HANDLE (MISCELLANEOUS) ×4 IMPLANT
CRADLE DONUT ADULT HEAD (MISCELLANEOUS) ×4 IMPLANT
DERMABOND ADVANCED (GAUZE/BANDAGES/DRESSINGS) ×4
DERMABOND ADVANCED .7 DNX12 (GAUZE/BANDAGES/DRESSINGS) ×4 IMPLANT
DRAPE CARDIOVASCULAR INCISE (DRAPES) ×2
DRAPE SLUSH/WARMER DISC (DRAPES) ×4 IMPLANT
DRAPE SRG 135X102X78XABS (DRAPES) ×2 IMPLANT
DRSG COVADERM 4X14 (GAUZE/BANDAGES/DRESSINGS) ×4 IMPLANT
ELECT REM PT RETURN 9FT ADLT (ELECTROSURGICAL) ×8
ELECTRODE REM PT RTRN 9FT ADLT (ELECTROSURGICAL) ×4 IMPLANT
GAUZE SPONGE 4X4 12PLY STRL (GAUZE/BANDAGES/DRESSINGS) ×8 IMPLANT
GLOVE BIO SURGEON STRL SZ 6 (GLOVE) ×8 IMPLANT
GLOVE BIO SURGEON STRL SZ 6.5 (GLOVE) ×6 IMPLANT
GLOVE BIO SURGEON STRL SZ7.5 (GLOVE) ×12 IMPLANT
GLOVE BIO SURGEONS STRL SZ 6.5 (GLOVE) ×2
GLOVE BIOGEL PI IND STRL 6.5 (GLOVE) ×4 IMPLANT
GLOVE BIOGEL PI INDICATOR 6.5 (GLOVE) ×4
GLOVE SURG SIGNA 7.5 PF LTX (GLOVE) ×12 IMPLANT
GOWN STRL REUS W/ TWL LRG LVL3 (GOWN DISPOSABLE) ×8 IMPLANT
GOWN STRL REUS W/ TWL XL LVL3 (GOWN DISPOSABLE) ×4 IMPLANT
GOWN STRL REUS W/TWL LRG LVL3 (GOWN DISPOSABLE) ×8
GOWN STRL REUS W/TWL XL LVL3 (GOWN DISPOSABLE) ×4
HEMOSTAT POWDER SURGIFOAM 1G (HEMOSTASIS) ×12 IMPLANT
HEMOSTAT SURGICEL 2X14 (HEMOSTASIS) ×4 IMPLANT
INSERT FOGARTY XLG (MISCELLANEOUS) IMPLANT
KIT BASIN OR (CUSTOM PROCEDURE TRAY) ×4 IMPLANT
KIT ROOM TURNOVER OR (KITS) ×4 IMPLANT
KIT SUCTION CATH 14FR (SUCTIONS) ×8 IMPLANT
KIT VASOVIEW W/TROCAR VH 2000 (KITS) ×4 IMPLANT
MARKER GRAFT CORONARY BYPASS (MISCELLANEOUS) ×12 IMPLANT
NS IRRIG 1000ML POUR BTL (IV SOLUTION) ×20 IMPLANT
PACK OPEN HEART (CUSTOM PROCEDURE TRAY) ×4 IMPLANT
PAD ARMBOARD 7.5X6 YLW CONV (MISCELLANEOUS) ×8 IMPLANT
PAD ELECT DEFIB RADIOL ZOLL (MISCELLANEOUS) ×4 IMPLANT
PENCIL BUTTON HOLSTER BLD 10FT (ELECTRODE) ×4 IMPLANT
PUNCH AORTIC ROTATE  4.5MM 8IN (MISCELLANEOUS) ×4 IMPLANT
PUNCH AORTIC ROTATE 4.0MM (MISCELLANEOUS) IMPLANT
PUNCH AORTIC ROTATE 4.5MM 8IN (MISCELLANEOUS) IMPLANT
PUNCH AORTIC ROTATE 5MM 8IN (MISCELLANEOUS) IMPLANT
SET CARDIOPLEGIA MPS 5001102 (MISCELLANEOUS) ×4 IMPLANT
SPONGE GAUZE 4X4 12PLY STER LF (GAUZE/BANDAGES/DRESSINGS) ×12 IMPLANT
SUT BONE WAX W31G (SUTURE) ×4 IMPLANT
SUT MNCRL AB 4-0 PS2 18 (SUTURE) IMPLANT
SUT PROLENE 3 0 SH DA (SUTURE) ×4 IMPLANT
SUT PROLENE 4 0 RB 1 (SUTURE) ×8
SUT PROLENE 4 0 SH DA (SUTURE) ×16 IMPLANT
SUT PROLENE 4-0 RB1 .5 CRCL 36 (SUTURE) ×8 IMPLANT
SUT PROLENE 6 0 C 1 30 (SUTURE) ×16 IMPLANT
SUT PROLENE 7 0 BV1 MDA (SUTURE) ×16 IMPLANT
SUT PROLENE 8 0 BV175 6 (SUTURE) IMPLANT
SUT STEEL 6MS V (SUTURE) ×4 IMPLANT
SUT STEEL STERNAL CCS#1 18IN (SUTURE) IMPLANT
SUT STEEL SZ 6 DBL 3X14 BALL (SUTURE) ×4 IMPLANT
SUT VIC AB 1 CTX 36 (SUTURE) ×4
SUT VIC AB 1 CTX36XBRD ANBCTR (SUTURE) ×4 IMPLANT
SUT VIC AB 2-0 CT1 27 (SUTURE) ×6
SUT VIC AB 2-0 CT1 TAPERPNT 27 (SUTURE) ×6 IMPLANT
SUT VIC AB 2-0 CTX 27 (SUTURE) IMPLANT
SUT VIC AB 3-0 SH 27 (SUTURE)
SUT VIC AB 3-0 SH 27X BRD (SUTURE) IMPLANT
SUT VIC AB 3-0 X1 27 (SUTURE) ×12 IMPLANT
SUT VICRYL 4-0 PS2 18IN ABS (SUTURE) IMPLANT
SUTURE E-PAK OPEN HEART (SUTURE) ×4 IMPLANT
SYSTEM SAHARA CHEST DRAIN ATS (WOUND CARE) ×4 IMPLANT
TAPE CLOTH SURG 4X10 WHT LF (GAUZE/BANDAGES/DRESSINGS) ×8 IMPLANT
TOWEL OR 17X24 6PK STRL BLUE (TOWEL DISPOSABLE) ×8 IMPLANT
TOWEL OR 17X26 10 PK STRL BLUE (TOWEL DISPOSABLE) ×8 IMPLANT
TRAY FOLEY IC TEMP SENS 16FR (CATHETERS) ×4 IMPLANT
TUBE FEEDING 8FR 16IN STR KANG (MISCELLANEOUS) ×4 IMPLANT
TUBING INSUFFLATION (TUBING) ×4 IMPLANT
UNDERPAD 30X30 INCONTINENT (UNDERPADS AND DIAPERS) ×4 IMPLANT
WATER STERILE IRR 1000ML POUR (IV SOLUTION) ×8 IMPLANT

## 2013-11-09 NOTE — CV Procedure (Signed)
Intra-operative Transesophageal Echocardiography Report:  Shane Frye is a 48 year old male with history of diabetes, hypertension, smoking and cocaine abuse who presented with severe chest pain and inferior lead ST elevation. He was ruled in for at myocardial infarction. The ST changes resolved with medical treatment and he underwent cardiac catheterization which revealed severe three-vessel coronary artery disease. He is now scheduled to undergo coronary artery bypass grafting by Dr. Roxan Hockey. Intraoperative transesophageal echocardiography was requested to evaluate the left and right ventricular function, to assess for any valvular pathology, and to serve as a monitor for intraoperative volume status.  The patient was brought to the operating room at [facility] and general anesthesia was induced without difficulty. Following endotracheal intubation and orogastric suctioning, the transesophageal echocardiography probe was inserted into the esophagus without difficulty.  Impression: Pre-bypass findings:  1. Aortic valve: The aortic valve was trileaflet. The leaflets opened normally without restriction and there was no aortic insufficiency. There was no thickening or calcification the leaflets noted.  2. Mitral valve: The mitral leaflets appeared thin and pliable and coapted normally without prolapse or fluttering segments. There was trace mitral insufficiency.  3. Left ventricle: The left ventricular cavity was within normal limits of size and measured 4.96 cm. at end diastole at the mid-papillary level in the transgastric short axis view and 3.97 cm in the transgastric short axis view at end systole. There was mild concentric left ventricular hypertrophy left ventricular wall thickness measured 1.15 cm at the inferior wall and anterior wall at end diastole at the mid-papillary level. The ejection fraction was estimated at 50%. There was normal appearing contractility of the anterior wall  and lateral walls and anterior septum. However the inferior septum in the distal regions was akinetic. There was no thinning of the left ventricular wall and there were no there was no thrombus noted in the left ventricular apex.   4. Right ventricle: The right ventricular cavity was within normal limits of size. There was normal appearing contractility of the right ventricular free wall and normal appearing right ventricular function.  5. Tricuspid valve: The tricuspid valve appeared structurally intact and there was trace tricuspid insufficiency.  6. Pulmonic valve: The pulmonic valve opened normally and there was no pulmonic insufficiency  7. Interatrial septum: The interatrial septum was intact without evidence of patent foramen ovale or atrial septal defect by color Doppler or bubble study.  8. Left atrium: There was no thrombus noted within left atrium or left atrial appendage.  9. Ascending aorta: There was  normal wall thickness of the ascending aorta. There was a well-defined aortic root and sinotubular ridge without effacement and no aneurysmal dilatation noted.   10. Descending aorta there was no significant atheromatous disease appreciated within the descending aorta. The descending aorta measured 2.12 cm in diameter  Post-bypass findings:  1. Aortic valve: The aortic valve appeared normal  and and was unchanged from the pre-bypass study.   2. Mitral valve: There was trace mitral insufficiency and the mitral leaflets coapted normally.  3. Left ventricle: The left ventricular ejection fraction was again estimated at approximately 50%. There was akinesis noted in the distal inferior wall and inferior septum. There was normal appearing contractility in all the other segments interrogated.  4. Right ventricle: The right ventricular cavity appeared within normal limits of size and there was normal contractility the right ventricular free wall.  5. Tricuspid valve: There was trace  tricuspid insufficiency which appeared unchanged from the pre-bypass study.   Roberts Gaudy,  M.D.

## 2013-11-09 NOTE — Anesthesia Postprocedure Evaluation (Signed)
  Anesthesia Post-op Note  Patient: Shane Frye  Procedure(s) Performed: Procedure(s): CORONARY ARTERY BYPASS GRAFTING (CABG) (N/A) INTRAOPERATIVE TRANSESOPHAGEAL ECHOCARDIOGRAM (N/A)  Patient Location: SICU  Anesthesia Type:General  Level of Consciousness: Patient remains intubated per anesthesia plan  Airway and Oxygen Therapy: Patient remains intubated per anesthesia plan and Patient placed on Ventilator (see vital sign flow sheet for setting)  Post-op Pain: none  Post-op Assessment: Post-op Vital signs reviewed, Patient's Cardiovascular Status Stable and Respiratory Function Stable  Post-op Vital Signs: Reviewed and stable  Last Vitals:  Filed Vitals:   11/09/13 0643  BP: 179/104  Pulse: 75  Temp:   Resp:     Complications: No apparent anesthesia complications

## 2013-11-09 NOTE — OR Nursing (Signed)
45 min call made to SICU nurse  

## 2013-11-09 NOTE — H&P (View-Only) (Signed)
Reason for Consult:3 vessel CAD s/p STEMI Referring Physician: Dr. Leida Lauth Shane Frye is an 48 y.o. male.  HPI: 49 yo man presented with a cc/o chest pain  [name] is a 48 yo man with a PMH significant for hypertension, type II diabetes, obesity, tobacco abuse, a strong family history of premature CAD and medical noncompliance. He was awakened yesterday AM at ~ 5 AM with sudden onset of substernal chest pain. He describes it as a severe pressure. He did not feel SOB and did not have diaphoresis. He had been using cocaine the evening before. He went to the ED at [facility] and was found to have hypertension and ST elevation. He ruled in for a STEMI with a troponin of 2.03. He was transferred to [facility] and taken directly to the cath lab. He was still having pain on arrival but it resolved during cath with NTG. Cath showed sevre diffuse 3 vessel CAD. He is currently pain free.   He has type II DM and takes metformin, but does not check his blood sugar regularly at home. He is actively smoking, he says < 1 ppd.   Past Medical History  Diagnosis Date  . Diabetes mellitus   . Hypertension   . GERD (gastroesophageal reflux disease)     Past Surgical History  Procedure Laterality Date  . No past surgeries      Family History  Problem Relation Age of Onset  . Hypertension Paternal Grandfather   . Hypertension Brother   . Diabetes Brother   CAD- brother died of MI at 82, father has CAD  Social History:  reports that he has been smoking Cigarettes.  He has been smoking about 0.50 packs per day. He has never used smokeless tobacco. He reports that he drinks about 1.2 ounces of alcohol per week. He reports that he uses illicit drugs.  Allergies: No Known Allergies  Medications:  Prior to Admission:  Prescriptions prior to admission  Medication Sig Dispense Refill  . Menthol, Topical Analgesic, (FLEXALL MAXIMUM STRENGTH EX) Apply 1 application topically daily.       Marland Kitchen atorvastatin  (LIPITOR) 10 MG tablet Take 10 mg by mouth daily.      . carvedilol (COREG) 12.5 MG tablet Take 12.5 mg by mouth 2 (two) times daily with a meal.      . Choline Fenofibrate (TRILIPIX) 45 MG capsule Take 45 mg by mouth daily.      Marland Kitchen gabapentin (NEURONTIN) 300 MG capsule Take 300 mg by mouth at bedtime.       . metFORMIN (GLUCOPHAGE) 1000 MG tablet Take 1,000 mg by mouth 2 (two) times daily with a meal.      . pantoprazole (PROTONIX) 20 MG tablet Take 20 mg by mouth daily.      . quinapril-hydrochlorothiazide (ACCURETIC) 20-25 MG per tablet Take 1 tablet by mouth 2 (two) times daily.        Results for orders placed during the hospital encounter of 11/04/13 (from the past 48 hour(s))  POCT ACTIVATED CLOTTING TIME     Status: None   Collection Time    11/04/13  5:08 PM      Result Value Ref Range   Activated Clotting Time 118    HEMOGLOBIN A1C     Status: Abnormal   Collection Time    11/04/13  5:30 PM      Result Value Ref Range   Hemoglobin A1C 9.3 (*) <5.7 %   Comment: (NOTE)  According to the ADA Clinical Practice Recommendations for 2011, when     HbA1c is used as a screening test:      >=6.5%   Diagnostic of Diabetes Mellitus               (if abnormal result is confirmed)     5.7-6.4%   Increased risk of developing Diabetes Mellitus     References:Diagnosis and Classification of Diabetes Mellitus,Diabetes     TGYB,6389,37(DSKAJ 1):S62-S69 and Standards of Medical Care in             Diabetes - 2011,Diabetes GOTL,5726,20 (Suppl 1):S11-S61.   Mean Plasma Glucose 220 (*) <117 mg/dL   Comment: Performed at [facility]     Status: None   Collection Time    11/04/13  6:12 PM      Result Value Ref Range   MRSA, PCR NEGATIVE  NEGATIVE   Staphylococcus aureus NEGATIVE  NEGATIVE   Comment:            The Xpert SA Assay (FDA     approved for NASAL specimens     in patients over  8 years of age),     is one component of     a comprehensive surveillance     program.  Test performance has     been validated by [employer] for patients greater     than or equal to 105 year old.     It is not intended     to diagnose infection nor to     guide or monitor treatment.  GLUCOSE, CAPILLARY     Status: Abnormal   Collection Time    11/04/13  8:10 PM      Result Value Ref Range   Glucose-Capillary 342 (*) 70 - 99 mg/dL   Comment 1 Capillary Sample    GLUCOSE, CAPILLARY     Status: Abnormal   Collection Time    11/04/13 10:27 PM      Result Value Ref Range   Glucose-Capillary 352 (*) 70 - 99 mg/dL   Comment 1 Capillary Sample    CBC     Status: Abnormal   Collection Time    11/05/13  3:18 AM      Result Value Ref Range   WBC 14.0 (*) 4.0 - 10.5 K/uL   RBC 4.99  4.22 - 5.81 MIL/uL   Hemoglobin 15.0  13.0 - 17.0 g/dL   HCT 43.3  39.0 - 52.0 %   MCV 86.8  78.0 - 100.0 fL   MCH 30.1  26.0 - 34.0 pg   MCHC 34.6  30.0 - 36.0 g/dL   RDW 13.4  11.5 - 15.5 %   Platelets 243  150 - 400 K/uL  BASIC METABOLIC PANEL     Status: Abnormal   Collection Time    11/05/13  3:18 AM      Result Value Ref Range   Sodium 136 (*) 137 - 147 mEq/L   Potassium 4.1  3.7 - 5.3 mEq/L   Chloride 96  96 - 112 mEq/L   CO2 23  19 - 32 mEq/L   Glucose, Bld 244 (*) 70 - 99 mg/dL   BUN 9  6 - 23 mg/dL   Creatinine, Ser 0.67  0.50 - 1.35 mg/dL   Calcium 8.3 (*) 8.4 - 10.5 mg/dL   GFR calc non Af Amer >90  >90 mL/min   GFR calc  Af Amer >90  >90 mL/min   Comment: (NOTE)     The eGFR has been calculated using the CKD EPI equation.     This calculation has not been validated in all clinical situations.     eGFR's persistently <90 mL/min signify possible Chronic Kidney     Disease.   Anion gap 17 (*) 5 - 15  GLUCOSE, CAPILLARY     Status: Abnormal   Collection Time    11/05/13  7:27 AM      Result Value Ref Range   Glucose-Capillary 268 (*) 70 - 99 mg/dL   Comment 1 Capillary Sample     HEPARIN LEVEL (UNFRACTIONATED)     Status: Abnormal   Collection Time    11/05/13  9:19 AM      Result Value Ref Range   Heparin Unfractionated 0.12 (*) 0.30 - 0.70 IU/mL   Comment:            IF HEPARIN RESULTS ARE BELOW     EXPECTED VALUES, AND PATIENT     DOSAGE HAS BEEN CONFIRMED,     SUGGEST FOLLOW UP TESTING     OF ANTITHROMBIN III LEVELS.    No results found.  Review of Systems  Constitutional: Positive for malaise/fatigue. Negative for fever and diaphoresis.  Respiratory: Negative for shortness of breath.   Cardiovascular: Positive for chest pain.  Musculoskeletal: Positive for myalgias.  All other systems reviewed and are negative.  Blood pressure 150/93, pulse 102, temperature 98.2 F (36.8 C), temperature source Oral, resp. rate 19, height _0  (1.753 m), weight 229 lb 12.8 oz (104.237 kg), SpO2 96.00%. Physical Exam  Vitals reviewed. Constitutional: He is oriented to person, place, and time. He appears well-developed. No distress.  obese  HENT:  Head: Normocephalic and atraumatic.  Eyes: EOM are normal. Pupils are equal, round, and reactive to light.  Neck: Neck supple. No thyromegaly present.  No carotid bruits  Cardiovascular: Regular rhythm.   No murmur heard. tachycardic  Respiratory: Effort normal and breath sounds normal.  GI: Soft. There is no tenderness.  Musculoskeletal: He exhibits no edema.  Lymphadenopathy:    He has no cervical adenopathy.  Neurological: He is alert and oriented to person, place, and time. No cranial nerve deficit.  No focal motor deficit  Skin: Skin is warm and dry.   ANGIOGRAPHY:  The left main coronary artery was angiographically normal which trifurcated into the LAD, ramus intermediate and left circumflex coronary artery.  The LAD had an 80% hazy eccentric proximal stenosis before giving rise to the first diagonal vessel. The first diagonal vessel, an 80% ostial stenosis. The mid LAD had diffuse 80% stenosis. The distal  ileum extending to the apex.  Ramus intermediate vessel was angiographically normal vessel. It was small caliber per  The left circumflex coronary artery had 50% proximal stenosis in the region of the takeoff of the first marginal vessel. There was 80% ostial stenosis of the first marginal. The second marginal vessel had 90% proximal stenosis.  The RCA was a large, dominant vessel that had moderate calcification. There was diffuse 95% mid PDA stenosis with reduced flow beyond this stenosis and also with occlusion of a branch which arose beyond this 95% stenosis. The distal continuation branch of the RCA had 90% stenosis in the region of the take off of the PLA vessel. It was an additional 70% stenosis in the most distal PLA 2 vessel.  Left ventriculography revealed mild LV dysfunction with an ejection fraction  of approximately 45- 50%. There was hypokinesis of the distal anterolateral wall apex, extending to the distal inferior wall.  IMPRESSION:  Severe three-vessel coronary disease with evidence for coronary calcification. 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 Stenosis.      Assessment/Plan: 48 yo man with multiple CRF, including poorly controlled DM, hypertension, tobacco and cocaine abuse and a strong family history of CAD. He presented yesterday with a STEMI. His pain resolved with medical therapy. He has severe 3 vessel CAD. CABG is indicated for survival benefit and relief of symptoms.  I discussed the general nature of CABG, the need for general anesthesia, the use of cardiopulmonary bypass, and the incisions to be used with Mr. Fikes and his family.We reviewed the indications, risks, benefits and alternatives. I discussed the expected hospital stay, overall  recovery and short and long term outcomes. He understands the risks include, but are not limited to death, stroke, MI, DVT/PE, bleeding, possible need for transfusion, infections, cardiac arrhythmias, and other organ system dysfunction including respiratory, renal, or GI complications.   He needs interval medical management of his BP and diabetes and I anticipate surgery will mid next week.  Eisley Barber C 11/05/2013, 11:27 AM

## 2013-11-09 NOTE — Anesthesia Preprocedure Evaluation (Addendum)
Anesthesia Evaluation  Patient identified by MRN, date of birth, ID band Patient awake    Reviewed: Allergy & Precautions, H&P , NPO status , Patient's Chart, lab work & pertinent test results, reviewed documented beta blocker date and time   Airway Mallampati: III TM Distance: >3 FB Neck ROM: Full    Dental  (+) Teeth Intact, Missing, Dental Advisory Given,    Pulmonary Current Smoker,  breath sounds clear to auscultation        Cardiovascular hypertension, Rhythm:Regular Rate:Normal     Neuro/Psych    GI/Hepatic   Endo/Other  diabetes, Poorly Controlled, Type 2, Oral Hypoglycemic Agents  Renal/GU      Musculoskeletal   Abdominal (+) + obese,   Peds  Hematology   Anesthesia Other Findings   Reproductive/Obstetrics                         Anesthesia Physical Anesthesia Plan  ASA: IV  Anesthesia Plan: General   Post-op Pain Management:    Induction: Intravenous  Airway Management Planned: Oral ETT  Additional Equipment: Arterial line, PA Cath, CVP, 3D TEE and Ultrasound Guidance Line Placement  Intra-op Plan:   Post-operative Plan: Extubation in OR  Informed Consent: I have reviewed the patients History and Physical, chart, labs and discussed the procedure including the risks, benefits and alternatives for the proposed anesthesia with the patient or authorized representative who has indicated his/her understanding and acceptance.   Dental advisory given  Plan Discussed with: CRNA and Anesthesiologist  Anesthesia Plan Comments: (48 year old male presented with STEMI, ST changes resolved with medical therapy Atrial Fib S/P Cardioversion Type 2 DM glucose 202 Recent cocaine use Hypertension,  Obesity  Plan GA with oral ETT and #D TEE  Roberts Gaudy)       Anesthesia Quick Evaluation

## 2013-11-09 NOTE — Anesthesia Procedure Notes (Signed)
Procedure Name: Intubation Date/Time: 11/09/2013 8:49 AM Performed by: Barrington Ellison Pre-anesthesia Checklist: Patient identified, Emergency Drugs available, Suction available, Patient being monitored and Timeout performed Patient Re-evaluated:Patient Re-evaluated prior to inductionOxygen Delivery Method: Circle system utilized Preoxygenation: Pre-oxygenation with 100% oxygen Intubation Type: IV induction Ventilation: Mask ventilation with difficulty, Two handed mask ventilation required and Oral airway inserted - appropriate to patient size Laryngoscope Size: Mac and 3 Grade View: Grade I Tube type: Oral Tube size: 8.0 mm Number of attempts: 1 Airway Equipment and Method: Stylet Placement Confirmation: ETT inserted through vocal cords under direct vision,  positive ETCO2 and breath sounds checked- equal and bilateral Secured at: 23 cm Tube secured with: Tape Dental Injury: Teeth and Oropharynx as per pre-operative assessment

## 2013-11-09 NOTE — Progress Notes (Signed)
Intubated  Awake and cooperative  BP 94/68  Pulse 80  Temp(Src) 98.4 F (36.9 C) (Core (Comment))  Resp 12  Ht 5\' 9"  (1.753 m)  Wt 229 lb 12.8 oz (104.237 kg)  BMI 33.92 kg/m2  SpO2 95%   Intake/Output Summary (Last 24 hours) at 11/09/13 2025 Last data filed at 11/09/13 1900  Gross per 24 hour  Intake 5535.49 ml  Output   7345 ml  Net -1809.51 ml   Vent Mode:  [-] PRVC;SIMV FiO2 (%):  [50 %-70 %] 70 % Set Rate:  [12 bmp] 12 bmp Vt Set:  [570 mL] 570 mL PEEP:  [5 cmH20] 5 cmH20 Pressure Support:  [10 cmH20] 10 cmH20 Plateau Pressure:  [16 cmH20] 16 cmH20  Awake and following commands  Extubate when FiO2 can be weaned

## 2013-11-09 NOTE — Interval H&P Note (Signed)
History and Physical Interval Note: See progress notes  11/09/2013 8:03 AM  Shane Frye  has presented today for surgery, with the diagnosis of CAD  The various methods of treatment have been discussed with the patient and family. After consideration of risks, benefits and other options for treatment, the patient has consented to  Procedure(s): CORONARY ARTERY BYPASS GRAFTING (CABG) (N/A) INTRAOPERATIVE TRANSESOPHAGEAL ECHOCARDIOGRAM (N/A) as a surgical intervention .  The patient's history has been reviewed, patient examined, no change in status, stable for surgery.  I have reviewed the patient's chart and labs.  Questions were answered to the patient's satisfaction.     HENDRICKSON,STEVEN C

## 2013-11-09 NOTE — Progress Notes (Addendum)
  Echocardiogram Echocardiogram Transesophageal with 3D images has been performed.  Diamond Nickel 11/09/2013, 9:52 AM

## 2013-11-09 NOTE — Transfer of Care (Signed)
Immediate Anesthesia Transfer of Care Note  Patient: Shane Frye  Procedure(s) Performed: Procedure(s): CORONARY ARTERY BYPASS GRAFTING (CABG) (N/A) INTRAOPERATIVE TRANSESOPHAGEAL ECHOCARDIOGRAM (N/A)  Patient Location: SICU  Anesthesia Type:General  Level of Consciousness: Patient remains intubated per anesthesia plan  Airway & Oxygen Therapy: Patient remains intubated per anesthesia plan and Patient placed on Ventilator (see vital sign flow sheet for setting)  Post-op Assessment: Report given to PACU RN  Post vital signs: Reviewed and stable  Complications: No apparent anesthesia complications

## 2013-11-09 NOTE — Brief Op Note (Addendum)
      [address]       [place], [address]             [phone]     11/04/2013 - 11/09/2013  1:18 PM  PATIENT:  Shane Frye  48 y.o. male  PRE-OPERATIVE DIAGNOSIS:  CAD  POST-OPERATIVE DIAGNOSIS:  CAD  PROCEDURE:  Procedure(s): CORONARY ARTERY BYPASS GRAFTING (CABG)X6   LIMA- LAD  SVG- OM1  SVG- PD- PL1  SVG- DIAGONAL- RAMUS INTRAOPERATIVE TRANSESOPHAGEAL ECHOCARDIOGRAM EVH LEFT LEG  SURGEON:  Surgeon(s): Melrose Nakayama, MD  PHYSICIAN ASSISTANT: WAYNE GOLD PA-C  ANESTHESIA:   general  PATIENT CONDITION:  ICU - intubated and hemodynamically stable.  PRE-OPERATIVE WEIGHT: 104kg  EBL: SEE ANEST/PERFUSION FLOW SHEET  COMPLICATIONS: NO KNOWN  PDA diffusely diseased Ramus intramyocardial  XC= 112 min CPB= 171 min, off with dopamine at 3

## 2013-11-10 ENCOUNTER — Inpatient Hospital Stay (HOSPITAL_COMMUNITY): Payer: Medicaid Other

## 2013-11-10 DIAGNOSIS — I219 Acute myocardial infarction, unspecified: Secondary | ICD-10-CM

## 2013-11-10 LAB — GLUCOSE, CAPILLARY
GLUCOSE-CAPILLARY: 122 mg/dL — AB (ref 70–99)
GLUCOSE-CAPILLARY: 139 mg/dL — AB (ref 70–99)
GLUCOSE-CAPILLARY: 144 mg/dL — AB (ref 70–99)
GLUCOSE-CAPILLARY: 271 mg/dL — AB (ref 70–99)
Glucose-Capillary: 101 mg/dL — ABNORMAL HIGH (ref 70–99)
Glucose-Capillary: 109 mg/dL — ABNORMAL HIGH (ref 70–99)
Glucose-Capillary: 120 mg/dL — ABNORMAL HIGH (ref 70–99)
Glucose-Capillary: 123 mg/dL — ABNORMAL HIGH (ref 70–99)
Glucose-Capillary: 126 mg/dL — ABNORMAL HIGH (ref 70–99)
Glucose-Capillary: 126 mg/dL — ABNORMAL HIGH (ref 70–99)
Glucose-Capillary: 142 mg/dL — ABNORMAL HIGH (ref 70–99)
Glucose-Capillary: 96 mg/dL (ref 70–99)
Glucose-Capillary: 96 mg/dL (ref 70–99)

## 2013-11-10 LAB — POCT I-STAT, CHEM 8
BUN: 18 mg/dL (ref 6–23)
CALCIUM ION: 1.14 mmol/L (ref 1.12–1.23)
CHLORIDE: 101 meq/L (ref 96–112)
Creatinine, Ser: 1.5 mg/dL — ABNORMAL HIGH (ref 0.50–1.35)
GLUCOSE: 159 mg/dL — AB (ref 70–99)
HCT: 41 % (ref 39.0–52.0)
Hemoglobin: 13.9 g/dL (ref 13.0–17.0)
Potassium: 4.6 mEq/L (ref 3.7–5.3)
Sodium: 131 mEq/L — ABNORMAL LOW (ref 137–147)
TCO2: 22 mmol/L (ref 0–100)

## 2013-11-10 LAB — POCT I-STAT 3, ART BLOOD GAS (G3+)
ACID-BASE DEFICIT: 2 mmol/L (ref 0.0–2.0)
BICARBONATE: 22.3 meq/L (ref 20.0–24.0)
O2 Saturation: 90 %
PH ART: 7.375 (ref 7.350–7.450)
Patient temperature: 100.4
TCO2: 23 mmol/L (ref 0–100)
pCO2 arterial: 38.4 mmHg (ref 35.0–45.0)
pO2, Arterial: 62 mmHg — ABNORMAL LOW (ref 80.0–100.0)

## 2013-11-10 LAB — CBC
HCT: 37.4 % — ABNORMAL LOW (ref 39.0–52.0)
HEMATOCRIT: 38.5 % — AB (ref 39.0–52.0)
HEMOGLOBIN: 12.7 g/dL — AB (ref 13.0–17.0)
Hemoglobin: 12.2 g/dL — ABNORMAL LOW (ref 13.0–17.0)
MCH: 28.7 pg (ref 26.0–34.0)
MCH: 29.3 pg (ref 26.0–34.0)
MCHC: 33 g/dL (ref 30.0–36.0)
MCHC: 32.6 g/dL (ref 30.0–36.0)
MCV: 86.9 fL (ref 78.0–100.0)
MCV: 89.9 fL (ref 78.0–100.0)
PLATELETS: 182 10*3/uL (ref 150–400)
Platelets: 208 10*3/uL (ref 150–400)
RBC: 4.43 MIL/uL (ref 4.22–5.81)
RBC: 4.16 MIL/uL — ABNORMAL LOW (ref 4.22–5.81)
RDW: 13.4 % (ref 11.5–15.5)
RDW: 13.6 % (ref 11.5–15.5)
WBC: 12.8 10*3/uL — ABNORMAL HIGH (ref 4.0–10.5)
WBC: 14.8 10*3/uL — ABNORMAL HIGH (ref 4.0–10.5)

## 2013-11-10 LAB — MAGNESIUM
MAGNESIUM: 2.2 mg/dL (ref 1.5–2.5)
Magnesium: 2.3 mg/dL (ref 1.5–2.5)

## 2013-11-10 LAB — BASIC METABOLIC PANEL
Anion gap: 12 (ref 5–15)
BUN: 11 mg/dL (ref 6–23)
CO2: 22 mEq/L (ref 19–32)
CREATININE: 0.7 mg/dL (ref 0.50–1.35)
Calcium: 8.1 mg/dL — ABNORMAL LOW (ref 8.4–10.5)
Chloride: 105 mEq/L (ref 96–112)
GLUCOSE: 130 mg/dL — AB (ref 70–99)
Potassium: 5.2 mEq/L (ref 3.7–5.3)
Sodium: 139 mEq/L (ref 137–147)

## 2013-11-10 LAB — CREATININE, SERUM
CREATININE: 1.49 mg/dL — AB (ref 0.50–1.35)
GFR calc Af Amer: 61 mL/min — ABNORMAL LOW (ref >90)
GFR, EST NON AFRICAN AMERICAN: 53 mL/min — AB (ref >90)

## 2013-11-10 MED ORDER — INSULIN ASPART 100 UNIT/ML ~~LOC~~ SOLN
4.0000 [IU] | Freq: Three times a day (TID) | SUBCUTANEOUS | Status: DC
Start: 2013-11-10 — End: 2013-11-11
  Administered 2013-11-10: 4 [IU] via SUBCUTANEOUS

## 2013-11-10 MED ORDER — FUROSEMIDE 10 MG/ML IJ SOLN
20.0000 mg | Freq: Once | INTRAMUSCULAR | Status: AC
  Administered 2013-11-10: 20 mg via INTRAVENOUS
  Filled 2013-11-10: qty 2

## 2013-11-10 MED ORDER — INSULIN ASPART 100 UNIT/ML ~~LOC~~ SOLN
0.0000 [IU] | SUBCUTANEOUS | Status: DC
  Administered 2013-11-10 (×2): 2 [IU] via SUBCUTANEOUS
  Administered 2013-11-10: 8 [IU] via SUBCUTANEOUS
  Administered 2013-11-11: 12 [IU] via SUBCUTANEOUS

## 2013-11-10 MED ORDER — ENOXAPARIN SODIUM 40 MG/0.4ML ~~LOC~~ SOLN
40.0000 mg | Freq: Every day | SUBCUTANEOUS | Status: DC
  Administered 2013-11-10 – 2013-11-14 (×5): 40 mg via SUBCUTANEOUS
  Filled 2013-11-10 (×6): qty 0.4

## 2013-11-10 MED ORDER — INSULIN DETEMIR 100 UNIT/ML ~~LOC~~ SOLN
25.0000 [IU] | Freq: Once | SUBCUTANEOUS | Status: AC
  Administered 2013-11-10: 25 [IU] via SUBCUTANEOUS
  Filled 2013-11-10: qty 0.25

## 2013-11-10 MED ORDER — ALBUMIN HUMAN 5 % IV SOLN
12.5000 g | Freq: Once | INTRAVENOUS | Status: AC
  Administered 2013-11-10: 12.5 g via INTRAVENOUS
  Filled 2013-11-10: qty 250

## 2013-11-10 MED ORDER — KETOROLAC TROMETHAMINE 30 MG/ML IJ SOLN
30.0000 mg | Freq: Four times a day (QID) | INTRAMUSCULAR | Status: DC
  Administered 2013-11-10 – 2013-11-12 (×7): 30 mg via INTRAVENOUS
  Filled 2013-11-10 (×12): qty 1

## 2013-11-10 MED ORDER — LEVALBUTEROL HCL 0.63 MG/3ML IN NEBU: 0.6300 mg | INHALATION_SOLUTION | Freq: Four times a day (QID) | RESPIRATORY_TRACT | Status: DC | PRN

## 2013-11-10 MED ORDER — INSULIN DETEMIR 100 UNIT/ML ~~LOC~~ SOLN
25.0000 [IU] | Freq: Two times a day (BID) | SUBCUTANEOUS | Status: DC
  Administered 2013-11-10: 25 [IU] via SUBCUTANEOUS
  Filled 2013-11-10 (×2): qty 0.25

## 2013-11-10 MED FILL — Sodium Chloride IV Soln 0.9%: INTRAVENOUS | Qty: 2000 | Status: AC

## 2013-11-10 MED FILL — Sodium Bicarbonate IV Soln 8.4%: INTRAVENOUS | Qty: 50 | Status: AC

## 2013-11-10 MED FILL — Mannitol IV Soln 20%: INTRAVENOUS | Qty: 500 | Status: AC

## 2013-11-10 MED FILL — Heparin Sodium (Porcine) Inj 1000 Unit/ML: INTRAMUSCULAR | Qty: 30 | Status: AC

## 2013-11-10 MED FILL — Lidocaine HCl IV Inj 20 MG/ML: INTRAVENOUS | Qty: 5 | Status: AC

## 2013-11-10 MED FILL — Electrolyte-R (PH 7.4) Solution: INTRAVENOUS | Qty: 4000 | Status: AC

## 2013-11-10 MED FILL — Heparin Sodium (Porcine) Inj 1000 Unit/ML: INTRAMUSCULAR | Qty: 10 | Status: AC

## 2013-11-10 NOTE — Procedures (Signed)
Extubation Procedure Note  Patient Details:   Name: Shane Frye DOB: Jan 01, 1966 MRN: [id number]   Airway Documentation:  Airway 8 mm (Active)  Secured at (cm) 23 cm 11/09/2013 11:02 PM  Measured From Lips 11/09/2013 11:02 PM  Secured Location Right 11/09/2013 11:02 PM  Secured By Pink Tape 11/09/2013 11:02 PM  Cuff Pressure (cm H2O) 24 cm H2O 11/09/2013  8:37 PM  Site Condition Dry 11/09/2013  3:36 PM    Evaluation  O2 sats: stable throughout Complications: No apparent complications Patient did tolerate procedure well. Bilateral Breath Sounds: Clear;Diminished Suctioning: Airway Yes Pt NIF -42 and VC 1500 with great efforts, post extubation IS 1500 with good efforts  Evonnie Dawes 11/10/2013, 4:05 AM

## 2013-11-10 NOTE — Progress Notes (Signed)
CT Surgery PM rounds  OOB in chair C/O incisional pain- received toradol NSR PM labs reviewed CBG controlled

## 2013-11-10 NOTE — Progress Notes (Signed)
1 Day Post-Op Procedure(s) (LRB): CORONARY ARTERY BYPASS GRAFTING (CABG) (N/A) INTRAOPERATIVE TRANSESOPHAGEAL ECHOCARDIOGRAM (N/A) Subjective: C/o incisional pain, denies nausea  Objective: Vital signs in last 24 hours: Temp:  [97.5 F (36.4 C)-101.1 F (38.4 C)] 100 F (37.8 C) (09/24 0800) Pulse Rate:  [66-91] 85 (09/24 0800) Cardiac Rhythm:  [-] Normal sinus rhythm (09/24 0730) Resp:  [12-29] 26 (09/24 0800) BP: (86-134)/(58-84) 134/84 mmHg (09/24 0800) SpO2:  [88 %-100 %] 92 % (09/24 0800) Arterial Line BP: (91-131)/(54-78) 131/78 mmHg (09/24 0800) FiO2 (%):  [40 %-70 %] 40 % (09/24 0258) Weight:  [244 lb 0.8 oz (110.7 kg)] 244 lb 0.8 oz (110.7 kg) (09/24 0500)  Hemodynamic parameters for last 24 hours: PAP: (29-43)/(21-32) 43/32 mmHg CO:  [3.9 L/min-5.4 L/min] 4.9 L/min CI:  [1.8 L/min/m2-2.5 L/min/m2] 2.2 L/min/m2  Intake/Output from previous day: 09/23 0701 - 09/24 0700 In: 6283 [P.O.:120; I.V.:4451; Blood:452; NG/GT:110; IV Piggyback:1150] Out: 3754 [Urine:3100; Blood:2315; Chest Tube:210] Intake/Output this shift: Total I/O In: 72.9 [I.V.:72.9] Out: 45 [Urine:45]  General appearance: alert and no distress Neurologic: intact Heart: regular rate and rhythm Lungs: diminished breath sounds bibasilar Abdomen: normal findings: soft, non-tender  Lab Results:  Recent Labs  11/09/13 2150 11/09/13 2154 11/10/13 0315  WBC 11.4*  --  12.8*  HGB 12.6* 13.3 12.7*  HCT 37.4* 39.0 38.5*  PLT 183  --  208   BMET:  Recent Labs  11/08/13 2135  11/09/13 2154 11/10/13 0315  NA 137  < > 138 139  K 4.1  < > 4.5 5.2  CL 99  < > 105 105  CO2 23  --   --  22  GLUCOSE 131*  < > 137* 130*  BUN 10  < > 8 11  CREATININE 0.71  < > 0.80 0.70  CALCIUM 9.0  --   --  8.1*  < > = values in this interval not displayed.  PT/INR:  Recent Labs  11/09/13 1530  LABPROT 16.0*  INR 1.28   ABG    Component Value Date/Time   PHART 7.375 11/10/2013 0319   HCO3 22.3 11/10/2013  0319   TCO2 23 11/10/2013 0319   ACIDBASEDEF 2.0 11/10/2013 0319   O2SAT 90.0 11/10/2013 0319   CBG (last 3)   Recent Labs  11/10/13 0258 11/10/13 0458 11/10/13 0600  GLUCAP 123* 122* 126*    Assessment/Plan: S/P Procedure(s) (LRB): CORONARY ARTERY BYPASS GRAFTING (CABG) (N/A) INTRAOPERATIVE TRANSESOPHAGEAL ECHOCARDIOGRAM (N/A) POD # 1 CV- in SR on amiodarone/ lopressor  Good index- wean dopamine and neo as tolerated  RESP- late extubation. IS  RENAL- creatinine OK, diurese for volume overload  ENDO- CBG well controlled with insulin drip, transition to levemir + SSI  DVT prophylaxis- SCD, add lovenox  PAIN- narcotics PRN will give toradol x 24 hours  OOB/ ambulate   LOS: 6 days    Edwyna Dangerfield C 11/10/2013

## 2013-11-10 NOTE — Progress Notes (Signed)
Noted that patient was transitioned off the IV insulin drip today and was ordered Levemir 25 units BID, Novolog 0-24 units Q4H, Novolog 4 units TID with meals. Patient received Levemir 25 units today at 12:00 and CBG 142 mg/dl at 16:09. In reviewing the chart, noted patient was only on Metformin 1000 mg BID as an outpatient and A1C was 9.3% on 11/04/13. Prior to surgery, patient was ordered Levemir 20 units QHS, Novolog 5 units TID with meals, Novolog 0-20 units AC, Novolog 0-5 units HS, and Metformin 1000 mg BID for inpatient glycemic control. Concerned that if patient receives Levemir 25 units BID (total of 50 units over 24 hour period) that patient may experience hypoglycemia. Talked with Hyiu, RN on unit to express concern about Levemir order and she will pass along information to oncoming nurse so they can discuss with the MD.   Thanks, Barnie Alderman, RN, MSN, [place] [facility] Coordinator Inpatient Diabetes Program [phone] (Team Pager) [phone] (AP office) [phone] [facility] office)

## 2013-11-10 NOTE — Anesthesia Postprocedure Evaluation (Signed)
  Anesthesia Post-op Note  Patient: Shane Frye  Procedure(s) Performed: Procedure(s): CORONARY ARTERY BYPASS GRAFTING (CABG) (N/A) INTRAOPERATIVE TRANSESOPHAGEAL ECHOCARDIOGRAM (N/A)  Patient Location: SICU  Anesthesia Type:General  Level of Consciousness: awake, alert  and oriented  Airway and Oxygen Therapy: Patient Spontanous Breathing and Patient connected to nasal cannula oxygen  Post-op Pain: mild  Post-op Assessment: Post-op Vital signs reviewed, Patient's Cardiovascular Status Stable, Respiratory Function Stable, Patent Airway, No signs of Nausea or vomiting and Pain level controlled  Post-op Vital Signs: stable  Last Vitals:  Filed Vitals:   11/10/13 1800  BP:   Pulse: 85  Temp:   Resp: 20    Complications: No apparent anesthesia complications

## 2013-11-10 NOTE — Op Note (Signed)
NAMEJEREMIE, GIANGRANDE.:  [id number]  MEDICAL RECORD NO.:  [id number]  LOCATION:  2S11C                        FACILITY:  [facility]  PHYSICIAN:  Revonda Standard. Roxan Hockey, M.D.DATE OF BIRTH:  10-06-1965  DATE OF PROCEDURE:  11/09/2013 DATE OF DISCHARGE:                              OPERATIVE REPORT   PREOPERATIVE DIAGNOSIS:  Three-vessel coronary artery disease, status post ST elevation myocardial infarction.  POSTOPERATIVE DIAGNOSIS:  Three-vessel coronary artery disease, status post ST elevation myocardial infarction.  PROCEDURE:  Median sternotomy, extracorporeal circulation Coronary artery bypass grafting x6  Left internal mammary artery to left anterior descending  Saphenous vein graft to obtuse marginal 1  Sequentialsaphenous vein graft to first diagonal and ramus intermedius  Sequential saphenous vein graft to posterior descending and   posterolateral Endoscopic vein harvest, left leg.  SURGEON:  Revonda Standard. Roxan Hockey, M.D.  ASSISTANT:  John Giovanni, PA-C  ANESTHESIA:  General.  FINDINGS:  No vein identified in right leg. Vein from left leg good Quality. Mammary artery good quality. Cardiomegaly. Posterior descending diffusely diseased, poor quality target.  Remaining targets good Quality. Ramus intermedius intramyocardial.  Transesophageal echocardiography revealed inferior apical akinesis, but otherwise preserved left ventricular function.  There was no significant valvular pathology.  CLINICAL NOTE:  Mr. Denio is a 48 year old gentleman with multiple cardiac risk factors, including poorly controlled diabetes, hypertension, obesity, tobacco abuse and cocaine use.  He presented with an acute ST-elevation MI on the morning of November 04, 2013.  He was taken urgently to the catheterization laboratory.  While there, his chest pain resolved.  He was found to have severe three-vessel coronary artery disease not amenable to percutaneous intervention.   He was referred for coronary artery bypass grafting.  The indications, risks, benefits, and alternatives were discussed in detail with the patient. He understood and accepted the risks and agreed to proceed.  OPERATIVE NOTE:  Mr. Fodor was brought to the preoperative holding area on November 09, 2013, there Anesthesia placed a Swan-Ganz catheter and an arterial blood pressure monitoring line.  He was taken to the operating room, anesthetized, and intubated.  Intravenous antibiotics were administered.  Transesophageal echocardiography was performed. Please refer to Dr. Shanon Brow Joslin's note for full details.  It showed overall preserved left ventricular function with the exception of Inferior-apical akinesis.  There was no significant valvular pathology. A Foley catheter was placed.  The chest, abdomen, and legs were prepped and draped in the usual sterile fashion.  An incision was made in the medial aspect of the right leg at the level of the knee.  The greater saphenous vein could not be localized at this level.  An incision was made in the medial aspect of the left leg and the greater saphenous vein was easily localized and was harvested endoscopically.  It turned out to be a good quality vessel.  2000 units of heparin was administered during the vein harvest.  Simultaneously with the vein harvest, a median sternotomy was performed and the left internal mammary artery was harvested using standard technique.  This was a good quality, 2 mm vessel that had excellent flow when divided distally.  After the conduits had been harvested, the  remainder of the full heparin dose was given.  The pericardium was opened.  Adequate anticoagulation was confirmed with ACT measurement.  The aorta was then cannulated via concentric 2-0 Ethibond pledgeted pursestring sutures.  The aorta was relatively foreshortened and there was marked cardiomegaly which made this relatively technically difficult and  also limited options for placement of the proximal anastomoses.  After cannulating the aorta, a dual-stage venous cannula was placed via a pursestring suture in the right atrial appendage.  Cardiopulmonary bypass was instituted.  The patient was cooled to 32 degrees Celsius.  Flows were maintained per protocol.  The coronary arteries were inspected and anastomotic sites were chosen.  Of note, the second posterolateral and second obtuse marginal were both very small vessels that were too small to graft.  The remaining targets were graftable, although the posterior descending was diffusely diseased.  The ramus intermedius was intramyocardial and was dissected out after the heart had been arrested.  The conduits were inspected and cut to length.  A foam pad was placed in the pericardium to insulate the heart.  A temperature probe was placed in the myocardial septum.  A cardioplegia cannula was placed in the ascending aorta.  The aorta was crossclamped.  The left ventricle was emptied via the aortic root vent.  Cardiac arrest then was achieved with a combination of cold antegrade blood cardioplegia and topical iced saline.  1 L of cardioplegia was administered.  There was a rapid diastolic arrest and myocardial cooling to 12 degrees Celsius. Additional doses of cardioplegia were administered at the completion of each vein graft.  A reversed saphenous vein graft was placed sequentially to the posterior descending and posterolateral 1 branches of the right coronary. Posterior descending was a relatively large vessel, but was diffusely diseased with calcific plaque, a 1.5 mm probe did pass distally down 1 branch and 1 mm probe passed distally down the other branch.  It was grafted just before the bifurcation.  A side-to-side anastomosis was performed with a running 7-0 Prolene suture.  The distal end of the vein then was beveled and was anastomosed end-to-side to the posterolateral 1.  This  was a 1.5 mm good quality target.  All anastomoses were probed proximally and distally at their completion prior to tying the sutures. At the completion of each vein graft, cardioplegia was administered down the graft to assess flow and hemostasis.  Next, a reversed saphenous vein graft was placed sequentially to the first diagonal branch of the LAD and the ramus intermedius.  The first diagonal was a bifurcating vessel that was grafted on the larger of the 2 branches.  It was a 1.5 mm good quality target at that site and a side-to-side anastomosis was performed with a running 7-0 Prolene suture.  The distal end of the vein was beveled and was anastomosed end- to-side to the ramus intermedius which was a 1.5 mm good quality target vessel, but was deeply intramyocardial.  Again with cardioplegia administration, there was good flow and good hemostasis at the anastomoses, although there was significant bleeding from the myocardium where it had been dissected to access the ramus intermedius.  In addition to the cardioplegia down the vein graft,  an additional dose of cardioplegia was administered via the aortic root as well.  Next, a reversed saphenous vein graft was placed end-to-side to obtuse marginal 1.  This was a very difficult anastomosis due to the patient's body habitus and marked cardiomegaly.  OM1 was a 1.5 mm good quality  target vessel.  OM2 was too small to graft.  The vein graft was anastomosed end-to-side with running 7-0 Prolene suture.  A probe passed easily proximally and distally.  Cardioplegia was administered.  There was good flow and good hemostasis.  After giving additional cardioplegia down the vein graft, the left internal mammary was brought through a window in the pericardium.  The distal end was beveled.  It was then anastomosed end-to-side to the distal LAD. The mammary was a 2 mm good quality conduit.  The LAD was a 1.5 mm good quality target at the site of  the anastomosis.  The end-to-side anastomosis was performed with a running 8-0 Prolene suture.  At the completion of the anastomosis, the bulldog clamp was removed.  There was rapid septal rewarming.  The bulldog clamp was replaced.  The mammary pedicle was tacked to the epicardial surface of the heart with 6-0 Prolene sutures.  Additional cardioplegia was administered.  The vein grafts were cut to length.  The cardioplegia cannula was removed from the ascending aorta. The proximal vein graft anastomoses were performed to 4.5 mm punch aortotomies with a running 6-0 Prolene sutures.  At the completion of the final proximal anastomosis, the patient was placed in Trendelenburg position.  Lidocaine was administered.  The aortic root was de-aired. The bulldog clamp was again removed from the left mammary artery.  After de-airing the aortic root, the aortic crossclamp was removed.  The total crossclamp time was 112 minutes.  The patient was initially in heart block, but later converted to ventricular fibrillation.  He required 1 defibrillation with 10 joules and then was in sinus bradycardia after that.  While rewarming was completed, all proximal and distal anastomoses were inspected for hemostasis.  There was some bleeding from the left atrial appendage.  It occurred with the heart basket removal.  This was repaired with a 4-0 Prolene pledgeted suture.  When the patient had rewarmed to a core temperature of 37 degrees Celsius, a dopamine infusion was initiated at 3 mcg/kg per minute and DDD pacing was initiated at 80 beats per minute.  The patient weaned from cardiopulmonary bypass on the first attempt without difficulty.  The total bypass time was 171 minutes.  The initial cardiac output was 6 L/minute and the patient remained hemodynamically stable throughout the post bypass period.  A test dose of protamine was administered and was well tolerated.  The atrial and aortic cannulae were  removed.  The remainder of the protamine was administered without incident.  The chest was irrigated with warm saline.  Hemostasis was achieved.  The pericardium was reapproximated over the heart and ascending aorta with interrupted 3-0 silk sutures. It came together without tension or kinking the underlying grafts.  A left pleural and mediastinal chest tube were placed via a separate subcostal incisions and secured with #1 silk sutures.  The sternum was closed with a combination of single and double heavy gauge stainless steel wires.  The pectoralis fascia, subcutaneous tissue, and skin were closed in the standard fashion.  All sponge, needle, and instrument counts were correct at the end of the procedure.  The patient was taken from the operating room to the surgical intensive care unit intubated and in good condition.     Revonda Standard Roxan Hockey, M.D.     SCH/MEDQ  D:  11/09/2013  T:  11/10/2013  Job:  [id number]

## 2013-11-11 ENCOUNTER — Encounter (HOSPITAL_COMMUNITY): Payer: Self-pay | Admitting: Thoracic Surgery (Cardiothoracic Vascular Surgery)

## 2013-11-11 ENCOUNTER — Inpatient Hospital Stay (HOSPITAL_COMMUNITY): Payer: Medicaid Other

## 2013-11-11 LAB — GLUCOSE, CAPILLARY
GLUCOSE-CAPILLARY: 236 mg/dL — AB (ref 70–99)
GLUCOSE-CAPILLARY: 88 mg/dL (ref 70–99)
GLUCOSE-CAPILLARY: 73 mg/dL (ref 70–99)
Glucose-Capillary: 201 mg/dL — ABNORMAL HIGH (ref 70–99)
Glucose-Capillary: 152 mg/dL — ABNORMAL HIGH (ref 70–99)
Glucose-Capillary: 98 mg/dL (ref 70–99)

## 2013-11-11 LAB — BASIC METABOLIC PANEL
Anion gap: 15 (ref 5–15)
BUN: 22 mg/dL (ref 6–23)
CO2: 21 meq/L (ref 19–32)
Calcium: 8.2 mg/dL — ABNORMAL LOW (ref 8.4–10.5)
Chloride: 100 mEq/L (ref 96–112)
Creatinine, Ser: 1.17 mg/dL (ref 0.50–1.35)
GFR calc Af Amer: 82 mL/min — ABNORMAL LOW (ref >90)
GFR calc non Af Amer: 71 mL/min — ABNORMAL LOW (ref >90)
GLUCOSE: 56 mg/dL — AB (ref 70–99)
POTASSIUM: 4.2 meq/L (ref 3.7–5.3)
SODIUM: 136 meq/L — AB (ref 137–147)

## 2013-11-11 LAB — CBC
HEMATOCRIT: 34.5 % — AB (ref 39.0–52.0)
Hemoglobin: 11.4 g/dL — ABNORMAL LOW (ref 13.0–17.0)
MCH: 29.4 pg (ref 26.0–34.0)
MCHC: 33 g/dL (ref 30.0–36.0)
MCV: 88.9 fL (ref 78.0–100.0)
Platelets: 183 10*3/uL (ref 150–400)
RBC: 3.88 MIL/uL — AB (ref 4.22–5.81)
RDW: 13.6 % (ref 11.5–15.5)
WBC: 12.7 10*3/uL — AB (ref 4.0–10.5)

## 2013-11-11 MED ORDER — SODIUM CHLORIDE 0.9 % IJ SOLN
3.0000 mL | Freq: Two times a day (BID) | INTRAMUSCULAR | Status: DC
  Administered 2013-11-11 – 2013-11-14 (×7): 3 mL via INTRAVENOUS

## 2013-11-11 MED ORDER — ALUM & MAG HYDROXIDE-SIMETH 200-200-20 MG/5ML PO SUSP: 15.0000 mL | ORAL | Status: DC | PRN

## 2013-11-11 MED ORDER — METFORMIN HCL 500 MG PO TABS
1000.0000 mg | ORAL_TABLET | Freq: Two times a day (BID) | ORAL | Status: DC
  Administered 2013-11-11 – 2013-11-15 (×9): 1000 mg via ORAL
  Filled 2013-11-11 (×11): qty 2

## 2013-11-11 MED ORDER — SODIUM CHLORIDE 0.9 % IV SOLN: 250.0000 mL | INTRAVENOUS | Status: DC | PRN

## 2013-11-11 MED ORDER — AMIODARONE HCL 200 MG PO TABS
400.0000 mg | ORAL_TABLET | Freq: Two times a day (BID) | ORAL | Status: DC
  Administered 2013-11-11 – 2013-11-15 (×9): 400 mg via ORAL
  Filled 2013-11-11 (×10): qty 2

## 2013-11-11 MED ORDER — GUAIFENESIN-DM 100-10 MG/5ML PO SYRP: 15.0000 mL | ORAL_SOLUTION | ORAL | Status: DC | PRN

## 2013-11-11 MED ORDER — ZOLPIDEM TARTRATE 5 MG PO TABS: 10.0000 mg | ORAL_TABLET | Freq: Every evening | ORAL | Status: DC | PRN

## 2013-11-11 MED ORDER — INSULIN ASPART 100 UNIT/ML ~~LOC~~ SOLN
0.0000 [IU] | Freq: Three times a day (TID) | SUBCUTANEOUS | Status: DC
  Administered 2013-11-11: 3 [IU] via SUBCUTANEOUS
  Administered 2013-11-11: 5 [IU] via SUBCUTANEOUS
  Administered 2013-11-12: 2 [IU] via SUBCUTANEOUS
  Administered 2013-11-12 – 2013-11-13 (×3): 3 [IU] via SUBCUTANEOUS
  Administered 2013-11-14 – 2013-11-15 (×2): 2 [IU] via SUBCUTANEOUS

## 2013-11-11 MED ORDER — MAGNESIUM HYDROXIDE 400 MG/5ML PO SUSP
30.0000 mL | Freq: Every day | ORAL | Status: DC | PRN
  Administered 2013-11-11: 30 mL via ORAL
  Filled 2013-11-11: qty 30

## 2013-11-11 MED ORDER — SODIUM CHLORIDE 0.9 % IJ SOLN: 3.0000 mL | INTRAMUSCULAR | Status: DC | PRN

## 2013-11-11 MED ORDER — GABAPENTIN 300 MG PO CAPS
300.0000 mg | ORAL_CAPSULE | Freq: Every day | ORAL | Status: DC
  Administered 2013-11-11 – 2013-11-14 (×4): 300 mg via ORAL
  Filled 2013-11-11 (×5): qty 1

## 2013-11-11 MED ORDER — CARVEDILOL 6.25 MG PO TABS
6.2500 mg | ORAL_TABLET | Freq: Two times a day (BID) | ORAL | Status: DC
  Administered 2013-11-11 (×2): 6.25 mg via ORAL
  Filled 2013-11-11 (×5): qty 1

## 2013-11-11 MED ORDER — INSULIN ASPART 100 UNIT/ML ~~LOC~~ SOLN: 0.0000 [IU] | Freq: Every day | SUBCUTANEOUS | Status: DC

## 2013-11-11 MED ORDER — POTASSIUM CHLORIDE CRYS ER 20 MEQ PO TBCR
20.0000 meq | EXTENDED_RELEASE_TABLET | Freq: Two times a day (BID) | ORAL | Status: DC
  Administered 2013-11-11 (×2): 20 meq via ORAL
  Filled 2013-11-11 (×4): qty 1

## 2013-11-11 MED ORDER — FUROSEMIDE 40 MG PO TABS
40.0000 mg | ORAL_TABLET | Freq: Every day | ORAL | Status: DC
  Administered 2013-11-11: 40 mg via ORAL
  Filled 2013-11-11 (×2): qty 1

## 2013-11-11 MED ORDER — INSULIN DETEMIR 100 UNIT/ML ~~LOC~~ SOLN
15.0000 [IU] | Freq: Two times a day (BID) | SUBCUTANEOUS | Status: DC
  Administered 2013-11-11 – 2013-11-13 (×6): 15 [IU] via SUBCUTANEOUS
  Filled 2013-11-11 (×7): qty 0.15

## 2013-11-11 MED ORDER — ALPRAZOLAM 0.25 MG PO TABS: 0.2500 mg | ORAL_TABLET | Freq: Four times a day (QID) | ORAL | Status: DC | PRN

## 2013-11-11 MED ORDER — MOVING RIGHT ALONG BOOK
Freq: Once | Status: DC
  Filled 2013-11-11: qty 1

## 2013-11-11 MED ORDER — INSULIN ASPART 100 UNIT/ML ~~LOC~~ SOLN
6.0000 [IU] | Freq: Three times a day (TID) | SUBCUTANEOUS | Status: DC
  Administered 2013-11-11 – 2013-11-15 (×12): 6 [IU] via SUBCUTANEOUS

## 2013-11-11 MED FILL — Heparin Sodium (Porcine) Inj 1000 Unit/ML: INTRAMUSCULAR | Qty: 30 | Status: AC

## 2013-11-11 MED FILL — Magnesium Sulfate Inj 50%: INTRAMUSCULAR | Qty: 10 | Status: AC

## 2013-11-11 MED FILL — Potassium Chloride Inj 2 mEq/ML: INTRAVENOUS | Qty: 40 | Status: AC

## 2013-11-11 NOTE — Progress Notes (Signed)
2 Days Post-Op Procedure(s) (LRB): CORONARY ARTERY BYPASS GRAFTING (CABG) (N/A) INTRAOPERATIVE TRANSESOPHAGEAL ECHOCARDIOGRAM (N/A) Subjective: "I feel fair" Denies nausea, some incisional pain  Objective: Vital signs in last 24 hours: Temp:  [97.5 F (36.4 C)-100.2 F (37.9 C)] 99.2 F (37.3 C) (09/25 0400) Pulse Rate:  [71-93] 90 (09/25 0700) Cardiac Rhythm:  [-] Normal sinus rhythm (09/25 0600) Resp:  [15-39] 15 (09/25 0700) BP: (101-139)/(57-95) 120/78 mmHg (09/25 0700) SpO2:  [90 %-95 %] 94 % (09/25 0700) Arterial Line BP: (120-131)/(70-78) 120/70 mmHg (09/24 0900) Weight:  [249 lb 9 oz (113.2 kg)] 249 lb 9 oz (113.2 kg) (09/25 0500)  Hemodynamic parameters for last 24 hours: PAP: (38-43)/(25-32) 38/25 mmHg CO:  [4.5 L/min] 4.5 L/min CI:  [2 L/min/m2] 2 L/min/m2  Intake/Output from previous day: 09/24 0701 - 09/25 0700 In: 3047.3 [P.O.:1480; I.V.:1217.3; IV Piggyback:350] Out: 3419 [Urine:1130; Chest Tube:40] Intake/Output this shift:    General appearance: alert and no distress Neurologic: intact Heart: regular rate and rhythm Lungs: diminished breath sounds bibasilar Abdomen: normal findings: soft, non-tender  Lab Results:  Recent Labs  11/10/13 1700 11/10/13 1750 11/11/13 0500  WBC 14.8*  --  12.7*  HGB 12.2* 13.9 11.4*  HCT 37.4* 41.0 34.5*  PLT 182  --  183   BMET:  Recent Labs  11/10/13 0315  11/10/13 1750 11/11/13 0500  NA 139  --  131* 136*  K 5.2  --  4.6 4.2  CL 105  --  101 100  CO2 22  --   --  21  GLUCOSE 130*  --  159* 56*  BUN 11  --  18 22  CREATININE 0.70  < > 1.50* 1.17  CALCIUM 8.1*  --   --  8.2*  < > = values in this interval not displayed.  PT/INR:  Recent Labs  11/09/13 1530  LABPROT 16.0*  INR 1.28   ABG    Component Value Date/Time   PHART 7.375 11/10/2013 0319   HCO3 22.3 11/10/2013 0319   TCO2 22 11/10/2013 1750   ACIDBASEDEF 2.0 11/10/2013 0319   O2SAT 90.0 11/10/2013 0319   CBG (last 3)   Recent Labs  11/10/13 2018 11/10/13 2324 11/11/13 0357  GLUCAP 236* 271* 88    Assessment/Plan: S/P Procedure(s) (LRB): CORONARY ARTERY BYPASS GRAFTING (CABG) (N/A) INTRAOPERATIVE TRANSESOPHAGEAL ECHOCARDIOGRAM (N/A) Plan for transfer to step-down: see transfer orders  CV- stable, maintaining SR, change amiodarone to PO  RESP- continue IS for bibasilar atelectasis  RENAL_ creatinine normal- diurese for volume overload  ENDO- CBG elevated yesterday afternoon. Hypoglycemia this AM, decrease levemir, increase meal coverage, restart PO  Enoxaparin + SCD for DVT prophlaxis   LOS: 7 days    Shane Frye C 11/11/2013

## 2013-11-11 NOTE — Progress Notes (Signed)
Patient transferred to room 2w09 from 2S. He is able to ambulate to bed with 1 assist. TELE applied and verified with CCMD. Safety precautions reviewed with patient. Alarm activated. Will continue to monitor.  Ave Filter, RN

## 2013-11-11 NOTE — Progress Notes (Signed)
CARDIAC REHAB PHASE I   PRE:  Rate/Rhythm: 78 SR  BP:  Supine:   Sitting: 96/60  Standing:    SaO2: 96% 3L  MODE:  Ambulation: 300 ft   POST:  Rate/Rhythm: 86  BP:  Supine:   Sitting: 118/82  Standing:    SaO2: 92%2L 1510-1540 Pt walked 300 ft on 2L with rolling walker with steady gait. Tolerated well on 2L. Left on 2L. Encouraged IS. To sitting on side of bed after walk.   Graylon Good, RN BSN  11/11/2013 3:35 PM

## 2013-11-12 ENCOUNTER — Inpatient Hospital Stay (HOSPITAL_COMMUNITY): Payer: Medicaid Other

## 2013-11-12 LAB — CBC
HCT: 32.4 % — ABNORMAL LOW (ref 39.0–52.0)
Hemoglobin: 10.6 g/dL — ABNORMAL LOW (ref 13.0–17.0)
MCH: 29.4 pg (ref 26.0–34.0)
MCHC: 32.7 g/dL (ref 30.0–36.0)
MCV: 89.8 fL (ref 78.0–100.0)
Platelets: 174 10*3/uL (ref 150–400)
RBC: 3.61 MIL/uL — ABNORMAL LOW (ref 4.22–5.81)
RDW: 13.4 % (ref 11.5–15.5)
WBC: 8 10*3/uL (ref 4.0–10.5)

## 2013-11-12 LAB — BASIC METABOLIC PANEL
Anion gap: 13 (ref 5–15)
BUN: 31 mg/dL — AB (ref 6–23)
CALCIUM: 7.8 mg/dL — AB (ref 8.4–10.5)
CHLORIDE: 97 meq/L (ref 96–112)
CO2: 23 mEq/L (ref 19–32)
CREATININE: 1.3 mg/dL (ref 0.50–1.35)
GFR calc non Af Amer: 63 mL/min — ABNORMAL LOW (ref >90)
GFR, EST AFRICAN AMERICAN: 73 mL/min — AB (ref >90)
Glucose, Bld: 99 mg/dL (ref 70–99)
Potassium: 4.3 mEq/L (ref 3.7–5.3)
Sodium: 133 mEq/L — ABNORMAL LOW (ref 137–147)

## 2013-11-12 LAB — GLUCOSE, CAPILLARY
GLUCOSE-CAPILLARY: 143 mg/dL — AB (ref 70–99)
Glucose-Capillary: 124 mg/dL — ABNORMAL HIGH (ref 70–99)
Glucose-Capillary: 179 mg/dL — ABNORMAL HIGH (ref 70–99)
Glucose-Capillary: 130 mg/dL — ABNORMAL HIGH (ref 70–99)

## 2013-11-12 MED ORDER — METOLAZONE 10 MG PO TABS
10.0000 mg | ORAL_TABLET | Freq: Once | ORAL | Status: AC
  Administered 2013-11-12: 10 mg via ORAL
  Filled 2013-11-12: qty 1

## 2013-11-12 MED ORDER — FUROSEMIDE 40 MG PO TABS
40.0000 mg | ORAL_TABLET | Freq: Every day | ORAL | Status: DC
  Filled 2013-11-12: qty 1

## 2013-11-12 MED ORDER — FUROSEMIDE 10 MG/ML IJ SOLN
40.0000 mg | Freq: Once | INTRAMUSCULAR | Status: AC
  Administered 2013-11-12: 40 mg via INTRAVENOUS
  Filled 2013-11-12: qty 4

## 2013-11-12 MED ORDER — CARVEDILOL 3.125 MG PO TABS
3.1250 mg | ORAL_TABLET | Freq: Two times a day (BID) | ORAL | Status: DC
Start: 2013-11-12 — End: 2013-11-15
  Administered 2013-11-12 – 2013-11-15 (×7): 3.125 mg via ORAL
  Filled 2013-11-12 (×9): qty 1

## 2013-11-12 MED ORDER — POTASSIUM CHLORIDE CRYS ER 20 MEQ PO TBCR
20.0000 meq | EXTENDED_RELEASE_TABLET | Freq: Every day | ORAL | Status: DC
  Administered 2013-11-12: 20 meq via ORAL
  Filled 2013-11-12 (×3): qty 1

## 2013-11-12 NOTE — Progress Notes (Addendum)
      [address]       [place] [address]             [phone]        3 Days Post-Op Procedure(s) (LRB): CORONARY ARTERY BYPASS GRAFTING (CABG) (N/A) INTRAOPERATIVE TRANSESOPHAGEAL ECHOCARDIOGRAM (N/A)  Subjective: Patient sleeping, awakened. His complaints include not sleeping well and minor sero sanguinous sternal drainage.  Objective: Vital signs in last 24 hours: Temp:  [98 F (36.7 C)-98.5 F (36.9 C)] 98.5 F (36.9 C) (09/26 0548) Pulse Rate:  [73-88] 73 (09/26 0548) Cardiac Rhythm:  [-] Normal sinus rhythm (09/25 2037) Resp:  [16-20] 16 (09/26 0548) BP: (97-128)/(61-79) 97/61 mmHg (09/26 0548) SpO2:  [92 %-100 %] 100 % (09/26 0548) Weight:  [251 lb 3.2 oz (113.944 kg)] 251 lb 3.2 oz (113.944 kg) (09/26 0500)  Pre op weight 104 kg Current Weight  11/12/13 251 lb 3.2 oz (113.944 kg)      Intake/Output from previous day: 09/25 0701 - 09/26 0700 In: 1186.8 [P.O.:960; I.V.:226.8] Out: 790 [Urine:790]   Physical Exam:  Cardiovascular: RRR, no murmurs, gallops, or rubs. Pulmonary: Slightly diminished at left base and right lung is clear; no rales, wheezes, or rhonchi. Abdomen: Soft, non tender, bowel sounds present. Extremities: Bilateral lower extremity edema. Wounds: Left lower extremity wounds are clean and dry.  No erythema or signs of infection. Minor sero sanguinous drainage from proximal and mid sternum. No erythema.  Lab Results: CBC: Recent Labs  11/11/13 0500 11/12/13 0450  WBC 12.7* 8.0  HGB 11.4* 10.6*  HCT 34.5* 32.4*  PLT 183 174   BMET:  Recent Labs  11/11/13 0500 11/12/13 0450  NA 136* 133*  K 4.2 4.3  CL 100 97  CO2 21 23  GLUCOSE 56* 99  BUN 22 31*  CREATININE 1.17 1.30  CALCIUM 8.2* 7.8*    PT/INR:  Lab Results  Component Value Date   INR 1.28 11/09/2013   INR 1.03 11/08/2013   ABG:  INR: Will add last result for INR, ABG once components are confirmed Will add last 4 CBG results once components  are confirmed  Assessment/Plan:  1. CV - Previous a fib. SR in 70's this am. S/p STEMI. On Amiodarone 400 bid, Coreg 6.25 bid. SBP labile this am. Will decrease Coreg to 3.125 bid with parameters. 2.  Pulmonary - CXR appears to show no pneumothorax, cardiomegaly, improved aeration on left, left base atelectasis and small effusion. On 2 liters of oxygen via Nevis-wean as tolerates. Robitussin PRN cough. Encourage incentive spirometer 3. Volume Overload - On Lasix 40 daily. Give IV this am and monitor creatinine. 4.  Acute blood loss anemia - H and H this am 10.6 and 32.4 5. DM-CBGs 152/98/124. Pre op HGA1C 9.3. On Metformin 1,000 bid and Insulin. Will need follow up with medical doctor. 6. Remove EPW in am 7. Minor sternal drainage-monitor. No need for antibiotics at this time  ZIMMERMAN,DONIELLE MPA-C 11/12/2013,7:35 AM   Chart reviewed, patient examined, agree with above. He has been walking a lot today.  Wt is still 20 lbs over preop. He received 40 mg IV lasix a few hrs ago and did not urinate much with that so far. Will give him another dose later with some Zaroxolyn. He is diabetic and has been on Toradol 30. Will stop this in case it is affecting renal function.

## 2013-11-12 NOTE — Progress Notes (Addendum)
CARDIAC REHAB PHASE I   Pt in bed upon arrival.  Completed MI and OHS education with pt.  Lunch arrived during education session and pt wanted to eat before ambulation.  Planned to come back to see pt to complete ambulation after he ate, but pt was seen walking with nurse tech in hall.  Did not walk pt due to this circumstance, but encouraged additional walk this evening.  Pt voiced understanding and did not have any questions at this time.  Pt interested in cardiac rehab at [facility].  [phone]  Lillia Dallas MS, ACSM RCEP 12:49 PM 11/12/2013

## 2013-11-13 LAB — BASIC METABOLIC PANEL
ANION GAP: 13 (ref 5–15)
BUN: 27 mg/dL — ABNORMAL HIGH (ref 6–23)
CHLORIDE: 94 meq/L — AB (ref 96–112)
CO2: 25 mEq/L (ref 19–32)
CREATININE: 0.84 mg/dL (ref 0.50–1.35)
Calcium: 7.9 mg/dL — ABNORMAL LOW (ref 8.4–10.5)
GFR calc Af Amer: >90 mL/min (ref >90)
GFR calc non Af Amer: >90 mL/min (ref >90)
Glucose, Bld: 235 mg/dL — ABNORMAL HIGH (ref 70–99)
Potassium: 4.4 mEq/L (ref 3.7–5.3)
Sodium: 132 mEq/L — ABNORMAL LOW (ref 137–147)

## 2013-11-13 LAB — GLUCOSE, CAPILLARY
GLUCOSE-CAPILLARY: 155 mg/dL — AB (ref 70–99)
GLUCOSE-CAPILLARY: 124 mg/dL — AB (ref 70–99)
GLUCOSE-CAPILLARY: 169 mg/dL — AB (ref 70–99)
Glucose-Capillary: 167 mg/dL — ABNORMAL HIGH (ref 70–99)

## 2013-11-13 MED ORDER — FUROSEMIDE 10 MG/ML IJ SOLN
40.0000 mg | Freq: Two times a day (BID) | INTRAMUSCULAR | Status: AC
  Administered 2013-11-13 (×2): 40 mg via INTRAVENOUS
  Filled 2013-11-13: qty 4

## 2013-11-13 MED ORDER — POTASSIUM CHLORIDE CRYS ER 20 MEQ PO TBCR
20.0000 meq | EXTENDED_RELEASE_TABLET | Freq: Two times a day (BID) | ORAL | Status: DC
  Administered 2013-11-13 (×2): 20 meq via ORAL
  Filled 2013-11-13 (×2): qty 1

## 2013-11-13 MED ORDER — CEPHALEXIN 500 MG PO CAPS
500.0000 mg | ORAL_CAPSULE | Freq: Three times a day (TID) | ORAL | Status: DC
  Administered 2013-11-13 – 2013-11-15 (×6): 500 mg via ORAL
  Filled 2013-11-13 (×11): qty 1

## 2013-11-13 MED ORDER — METOLAZONE 10 MG PO TABS
10.0000 mg | ORAL_TABLET | Freq: Once | ORAL | Status: AC
  Administered 2013-11-13: 10 mg via ORAL
  Filled 2013-11-13: qty 1

## 2013-11-13 NOTE — Progress Notes (Addendum)
      [address]       [place] [address]             [phone]        4 Days Post-Op Procedure(s) (LRB): CORONARY ARTERY BYPASS GRAFTING (CABG) (N/A) INTRAOPERATIVE TRANSESOPHAGEAL ECHOCARDIOGRAM (N/A)  Subjective: Patient had a bowel movement. He still has  sero sanguinous sternal drainage.  Objective: Vital signs in last 24 hours: Temp:  [97.6 F (36.4 C)-98.3 F (36.8 C)] 98.2 F (36.8 C) (09/27 0342) Pulse Rate:  [75-87] 85 (09/27 0342) Cardiac Rhythm:  [-] Normal sinus rhythm (09/26 2000) Resp:  [18-20] 18 (09/27 0342) BP: (104-138)/(62-88) 104/62 mmHg (09/27 0342) SpO2:  [94 %-97 %] 94 % (09/27 0342) Weight:  [247 lb 3.2 oz (112.129 kg)] 247 lb 3.2 oz (112.129 kg) (09/27 0300)  Pre op weight 104 kg Current Weight  11/13/13 247 lb 3.2 oz (112.129 kg)      Intake/Output from previous day: 09/26 0701 - 09/27 0700 In: -  Out: 9147 [Urine:3375]   Physical Exam:  Cardiovascular: RRR, no murmurs, gallops, or rubs. Pulmonary: Slightly diminished at left base and right lung is clear; no rales, wheezes, or rhonchi. Abdomen: Soft, non tender, bowel sounds present. Extremities: Bilateral lower extremity edema. Wounds: Left lower extremity wounds are clean and dry.  No erythema or signs of infection. Sero sanguinous drainage from sternal wound. Minor erythema.  Lab Results: CBC:  Recent Labs  11/11/13 0500 11/12/13 0450  WBC 12.7* 8.0  HGB 11.4* 10.6*  HCT 34.5* 32.4*  PLT 183 174   BMET:   Recent Labs  11/12/13 0450 11/13/13 0337  NA 133* 132*  K 4.3 4.4  CL 97 94*  CO2 23 25  GLUCOSE 99 235*  BUN 31* 27*  CREATININE 1.30 0.84  CALCIUM 7.8* 7.9*    PT/INR:  Lab Results  Component Value Date   INR 1.28 11/09/2013   INR 1.03 11/08/2013   ABG:  INR: Will add last result for INR, ABG once components are confirmed Will add last 4 CBG results once components are confirmed  Assessment/Plan:  1. CV - Previous a fib. SR in  70's this am. S/p STEMI. On Amiodarone 400 bid, Coreg 3.125 bid. SBP labile this am. Will start ACE when BP allows. 2.  Pulmonary -  Now on room air.Robitussin PRN cough. Encourage incentive spirometer 3. Volume Overload - Given Lasix 40 IV yesterday am and  in afternoon, along with Zaroxolyn. Good diuresis. Will repeat today as creatinine "WNL" 4.  Acute blood loss anemia - LAst H and H  10.6 and 32.4 5. DM-CBGs 143/130/167. Pre op HGA1C 9.3. On Metformin 1,000 bid and Insulin. Will need follow up with medical doctor. 6. Remove EPW  7. Sternal drainage is increased today. Likely fat necrosis. As discussed with Dr. Cyndia Bent, will start Keflex 8. Possible discharge 1-2 days  ZIMMERMAN,DONIELLE MPA-C 11/13/2013,7:27 AM    Chart reviewed, patient examined, agree with above. He has  Significant drainage from the chest incision that looks like fat necrosis. Continue dressing changes and start Keflex empirically.

## 2013-11-13 NOTE — Progress Notes (Signed)
Pt started to have complaints that he was feeling hot and then cold one minute to the next.  Checked temp and found to be afebrile.  Pt stated he felt as though he had waited too long to take pain medication before.  I gave him a dose of his oxycodone and tylenol with some IV zofran.  Pt stated he is starting to feel better.  Changed sternal dressing, and drainage seems lessened from overnight and yesterday.  Will continue to monitor.

## 2013-11-13 NOTE — Discharge Summary (Signed)
Physician Discharge Summary       [address]       [place] [address]             [phone]    Patient ID: Shane Frye MRN: [id number] DOB/AGE: Mar 05, 1965 48 y.o.  Admit date: 11/04/2013 Discharge date: 11/15/2013  Admission Diagnoses: 1. S/p STEMI 2. 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 History of hypertension 3. History of DM 4. History of hyperlipidemia 5. History of obesity 6. History of tobacco abuse 7. History of cocaine abuse 8. History of GERD   Discharge Diagnoses:  1. S/p STEMI 2. 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 History of hypertension 3. History of DM 4. History of hyperlipidemia 5. History of obesity 6. History of tobacco abuse 7. History of cocaine abuse 8. History of GERD 9. A fib post op (converted to SR) 10. ABL anemia 11. Fat necrosis, drainage sternal wound  Procedure (s):  1. Cardiac catheterization done by Dr. Claiborne Billings: ANGIOGRAPHY:  The left main coronary artery was angiographically normal which trifurcated into the LAD, ramus intermediate and left circumflex coronary artery.  The LAD had an 80% hazy eccentric proximal stenosis before giving rise to the first diagonal vessel. The first diagonal vessel, an 80% ostial stenosis. The mid LAD had diffuse 80% stenosis. The distal ileum extending to the apex.  Ramus intermediate vessel was angiographically normal vessel. It was small caliber per  The left circumflex coronary artery had 50% proximal stenosis in the region of the takeoff of the first marginal vessel. There was 80% ostial stenosis of the first marginal. The second marginal vessel had 90% proximal stenosis.  The RCA was a large, dominant vessel that had moderate calcification. There was diffuse 95% mid PDA stenosis with reduced flow beyond this stenosis and also with occlusion of a branch which arose beyond this 95% stenosis. The distal continuation branch of the RCA had 90% stenosis in the region of the take off of the  PLA vessel. It was an additional 70% stenosis in the most distal PLA 2 vessel.  Left ventriculography revealed mild LV dysfunction with an ejection fraction of approximately 45- 50%. There was hypokinesis of the distal anterolateral wall apex, extending to the distal inferior wall. IMPRESSION:  Severe three-vessel coronary disease with evidence for coronary calcification. 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 Stenosis.  2. DC Cardioversion by Dr. Acie Fredrickson on 11/06/2013  3. Median sternotomy, extracorporeal circulation, coronary  artery bypass grafting x6 (left internal mammary artery to left anterior descending, saphenous vein graft to obtuse marginal 1, sequential saphenous vein graft to first diagonal and ramus intermedius, sequential saphenous vein graft to posterior descending and posterolateral 1), endoscopic vein harvest, left leg by Dr. Roxan Hockey on 11/10/2013.  History of Presenting Illness: This is a 48 yo man with a PMH significant for hypertension, type II diabetes, obesity, tobacco abuse, a strong family history of premature CAD and medical noncompliance. He was awakened yesterday AM at ~ 5 AM with sudden onset of substernal chest pain. He describes it as a severe pressure. He did not feel SOB and did not have diaphoresis. He had been using cocaine the evening before. He went to the ED at [facility] and was found to have hypertension and ST elevation. He ruled  in for a STEMI with a troponin of 2.03. He was transferred to [facility] and taken directly to the cath lab. He was still having pain on arrival but it resolved during cath with NTG. Cath showed sevre diffuse 3 vessel CAD. He is currently pain free.  He has type II DM and takes metformin, but does not check his  blood sugar regularly at home. He is actively smoking, he says < 1 ppd. Dr. Roxan Hockey was consulted for the consideration of coronary artery bypass grafting surgery. Potential risks, complications, and benefits were discussed with the patient and he agreed to proceed with surgery. Pre operative carotid duplex US showed no significant internal carotid artery stenosis and ABI's were 1.18 on the right and 1.15 on the left.Marland KitchenHe underwent a CABG x 6 on 11/10/2013.   Brief Hospital Course:  The patient was extubated early post operative day one without difficulty. He remained afebrile and hemodynamically stable. He was weaned of Dopamine and Neo synephrine drips. [name], a line, chest tubes, and foley were removed early in the post operative course. Amiodarone ( a fib) and Lopressor were started and titrated accordingly. He was volume overloaded and diuresed. He was weaned off the insulin drip. Once he was tolerating a diet, home diabetes medications were restarted. He also required low dose insulin to keep sugars controlled. The patient's HGA1C pre op was 9.3. He will need further follow up with a medical doctor after discharge. He did have ABL anemia. He did not require a post op transfusion and his last H and H was 10.6 and 32.4. The patient was felt surgically stable for transfer from the ICU to PCTU for further convalescence on 11/11/2013.  He continues to progress with cardiac rehab. He initially required 2 liters of oxygen via [place]. He was weaned to room air and has been ambulating on room air. He developed serosanguinous drainage from his sternum. This was likely from fat necrosis. He was placed on Keflex and this has improved. He has been tolerating a diet and has had a bowel movement. Epicardial pacing wires and chest tube sutures will be removed prior to discharge. The patient remains afebrile, hemodynamically stable, and during morning round evaluation, he was found to be surgically stable for  discharge on 11/15/2012.    Latest Vital Signs: Blood pressure 132/83, pulse 92, temperature 98.3 F (36.8 C), temperature source Oral, resp. rate 18, height 5\' 9"  (1.753 m), weight 234 lb 12.6 oz (106.5 kg), SpO2 98.00%.  Physical Exam: Cardiovascular: RRR, no murmurs, gallops, or rubs.  Pulmonary: Slightly diminished at left base and right lung is clear; no rales, wheezes, or rhonchi.  Abdomen: Soft, non tender, bowel sounds present.  Extremities: Bilateral lower extremity edema.  Wounds: Left lower extremity wounds are clean and dry. No erythema or signs of infection. Sero sanguinous drainage from sternal wound. Minor erythema.   Discharge Condition:Stable  Recent laboratory studies:  Lab Results  Component Value Date   WBC 8.4 11/14/2013   HGB 11.2* 11/14/2013   HCT 34.2* 11/14/2013   MCV 86.8 11/14/2013   PLT 321 11/14/2013   Lab Results  Component Value Date   NA 131* 11/14/2013   K 5.1 11/14/2013   CL 94* 11/14/2013   CO2 24 11/14/2013   CREATININE 0.71 11/14/2013   GLUCOSE 148* 11/14/2013      Diagnostic Studies: Dg Chest 2 View  11/12/2013   CLINICAL DATA:  CABG 11/09/2013, followup atelectasis.  EXAM: CHEST  2 VIEW  COMPARISON:  11/11/2013  FINDINGS: Lungs are adequately inflated with moderate improvement in the previously seen hazy opacification over the left mid to lower lung with minimal residual opacification in the left base likely atelectasis with small residual left effusion. No pneumothorax. Cardiomediastinal silhouette and remainder of the exam is unchanged.  IMPRESSION: Moderate interval improvement in left mid to lower lung opacification with findings likely representing small left effusion with minimal associated left basilar atelectasis.   Electronically Signed   By: Marin Olp M.D.   On: 11/12/2013 07:42       Discharge Instructions   Amb Referral to Cardiac Rehabilitation    Complete by:  As directed            Discharge Medications:   Medication  List    STOP taking these medications       quinapril-hydrochlorothiazide 20-25 MG per tablet  Commonly known as:  ACCURETIC      TAKE these medications       amiodarone 400 MG tablet  Commonly known as:  PACERONE  Take 1 tablet (400 mg total) by mouth 2 (two) times daily.     aspirin 325 MG EC tablet  Take 1 tablet (325 mg total) by mouth daily.     atorvastatin 10 MG tablet  Commonly known as:  LIPITOR  Take 10 mg by mouth daily.     carvedilol 3.125 MG tablet  Commonly known as:  COREG  Take 1 tablet (3.125 mg total) by mouth 2 (two) times daily with a meal.     cephALEXin 500 MG capsule  Commonly known as:  KEFLEX  Take 1 capsule (500 mg total) by mouth 3 (three) times daily. X 1 week     FLEXALL MAXIMUM STRENGTH EX  Apply 1 application topically daily.     furosemide 40 MG tablet  Commonly known as:  LASIX  Take 1 tablet (40 mg total) by mouth daily. X 1 week     gabapentin 300 MG capsule  Commonly known as:  NEURONTIN  Take 300 mg by mouth at bedtime.     insulin aspart protamine- aspart (70-30) 100 UNIT/ML injection  Commonly known as:  NOVOLOG MIX 70/30  Inject 0.15 mLs (15 Units total) into the skin 2 (two) times daily with a meal.     lisinopril 5 MG tablet  Commonly known as:  PRINIVIL,ZESTRIL  Take 1 tablet (5 mg total) by mouth daily.     metFORMIN 1000 MG tablet  Commonly known as:  GLUCOPHAGE  Take 1,000 mg by mouth 2 (two) times daily with a meal.     oxyCODONE 5 MG immediate release tablet  Commonly known as:  Oxy IR/ROXICODONE  Take 1-2 tablets (5-10 mg total) by mouth every 3 (three) hours as needed for moderate pain.     pantoprazole 20 MG tablet  Commonly known as:  PROTONIX  Take 20 mg by mouth daily.     potassium chloride SA 20 MEQ tablet  Commonly known as:  K-DUR,KLOR-CON  Take 1 tablet (20 mEq total) by mouth daily. X 1 week     TRILIPIX 45 MG capsule  Generic drug:  Choline Fenofibrate  Take 45 mg by mouth daily.          The patient has been discharged on:   1.Beta Blocker:  Yes [ x  ]  No   [   ]                              If No, reason:  2.Ace Inhibitor/ARB: Yes [ x  ]                                     No  [    ]                                     If No, reason:  3.Statin:   Yes [ x  ]                  No  [   ]                  If No, reason:  4.Ecasa:  Yes  [  x ]                  No   [   ]                  If No, reason:    Follow Up Appointments: Follow-up Information   Follow up with Melrose Nakayama, MD. (PA/LAT CXR to be taken (at [place] which is in the same building as Dr. Leonarda Salon office) one hour prior to office appointment;Office will mail appointment date and time)    Specialty:  Cardiothoracic Surgery   Contact information:   [address] [place] [address] [phone]       Please follow up. (Obtain a medical doctor for further surveillance of HGA1C 9.3 and diabetes management)       Follow up with [facility], NP On 11/22/2013. (@ 9:30am.)    Specialty:  Cardiology   Contact information:   [place] [address] [phone]       Signed: Mashayla Lavin HPA-C 11/15/2013, 8:57 AM

## 2013-11-13 NOTE — Progress Notes (Signed)
Pacing Wires were pulled at 0900 without incident.  The sutures anchoring the wires were clipped and wires were pulled and wires were noted to be intact.  VS remained normal throughout the procedure and for one hour after.  Pt remained lying in bed as instructed for one hour after their removal.  Chest tube sutures are still in place.  Dry gauzes were placed over wire sites and sutured chest tube sites, as pt continues to have minimal serosanguinous drainage from these sits as well.

## 2013-11-13 NOTE — Addendum Note (Signed)
Addendum created 11/13/13 1842 by Roberts Gaudy, MD   Modules edited: Clinical Notes   Clinical Notes:  File: [id number]; Pend: [id number]; Pend: [id number]

## 2013-11-13 NOTE — Progress Notes (Signed)
Anesthesiology Follow-up:  48 year old male S/P CABG X 4 on 9/23  Awake and alert sitting in chair, neuro intact, having some incisional pain, moved to 2W on 9/25.  VS: T- 36.7  BP-131/80 HR-80 (SR) RR-18 O2 Sat 96% on RA  K-4.4 BUN/Cr. 27/0.84 glucose 220 H/H: 11/23/30.4 Platelets 174,000  Extubated 12 hours post-op on 9/24.   Doing well overall, should be ready for discharge in next 1-2 days.  Shane Frye

## 2013-11-13 NOTE — Discharge Instructions (Signed)
° ° °Activity: 1.May walk up steps °               2.No lifting more than ten pounds for four weeks.  °               3.No driving for four weeks. °               4.Stop any activity that causes chest pain, shortness of breath, dizziness, sweating or excessive weakness. °               5.Avoid straining. °               6.Continue with your breathing exercises daily. ° °Diet: Diabetic diet and Low fat, Low salt diet ° °Wound Care: May shower.  Clean wounds with mild soap and water daily. Contact the office at [phone] if any problems arise. ° °Coronary Artery Bypass Grafting, Care After °Refer to this sheet in the next few weeks. These instructions provide you with information on caring for yourself after your procedure. Your health care provider may also give you more specific instructions. Your treatment has been planned according to current medical practices, but problems sometimes occur. Call your health care provider if you have any problems or questions after your procedure. °WHAT TO EXPECT AFTER THE PROCEDURE °Recovery from surgery will be different for everyone. Some people feel well after 3 or 4 weeks, while for others it takes longer. After your procedure, it is typical to have the following: °· Nausea and a lack of appetite.   °· Constipation. °· Weakness and fatigue.   °· Depression or irritability.   °· Pain or discomfort at your incision site. °HOME CARE INSTRUCTIONS °· Take medicines only as directed by your health care provider. Do not stop taking medicines or start any new medicines without first checking with your health care provider. °· Take your pulse as directed by your health care provider. °· Perform deep breathing as directed by your health care provider. If you were given a device called an incentive spirometer, use it to practice deep breathing several times a day. Support your chest with a pillow or your arms when you take deep breaths or cough. °· Keep incision areas clean, dry, and  protected. Remove or change any bandages (dressings) only as directed by your health care provider. You may have skin adhesive strips over the incision areas. Do not take the strips off. They will fall off on their own. °· Check incision areas daily for any swelling, redness, or drainage. °· If incisions were made in your legs, do the following: °¨ Avoid crossing your legs.   °¨ Avoid sitting for long periods of time. Change positions every 30 minutes.   °¨ Elevate your legs when you are sitting. °· Wear compression stockings as directed by your health care provider. These stockings help keep blood clots from forming in your legs. °· Take showers once your health care provider approves. Until then, only take sponge baths. Pat incisions dry. Do not rub incisions with a washcloth or towel. Do not take baths, swim, or use a hot tub until your health care provider approves. °· Eat foods that are high in fiber, such as raw fruits and vegetables, whole grains, beans, and nuts. Meats should be lean cut. Avoid canned, processed, and fried foods. °· Drink enough fluid to keep your urine clear or pale yellow. °· Weigh yourself every day. This helps identify if you are retaining fluid that may make your heart   lungs work harder.  Rest and limit activity as directed by your health care provider. You may be instructed to:  Stop any activity at once if you have chest pain, shortness of breath, irregular heartbeats, or dizziness. Get help right away if you have any of these symptoms.  Move around frequently for short periods or take short walks as directed by your health care provider. Increase your activities gradually. You may need physical therapy or cardiac rehabilitation to help strengthen your muscles and build your endurance.  Avoid lifting, pushing, or pulling anything heavier than 10 lb (4.5 kg) for at least 6 weeks after surgery.  Do not drive until your health care provider approves.  Ask your health  care provider when you may return to work.  Ask your health care provider when you may resume sexual activity.  Keep all follow-up visits as directed by your health care provider. This is important. SEEK MEDICAL CARE IF:  You have swelling, redness, increasing pain, or drainage at the site of an incision.  You have a fever.  You have swelling in your ankles or legs.  You have pain in your legs.   You gain 2 or more pounds (0.9 kg) a day.  You are nauseous or vomit.  You have diarrhea. SEEK IMMEDIATE MEDICAL CARE IF:  You have chest pain that goes to your jaw or arms.  You have shortness of breath.   You have a fast or irregular heartbeat.   You notice a "clicking" in your breastbone (sternum) when you move.   You have numbness or weakness in your arms or legs.  You feel dizzy or light-headed.  MAKE SURE YOU:  Understand these instructions.  Will watch your condition.  Will get help right away if you are not doing well or get worse. Document Released: 08/23/2004 Document Revised: 06/20/2013 Document Reviewed: 07/13/2012 [facility] Patient Information 2015 [place], [place]. This information is not intended to replace advice given to you by your health care provider. Make sure you discuss any questions you have with your health care provider.  Diabetes Mellitus and Food It is important for you to manage your blood sugar (glucose) level. Your blood glucose level can be greatly affected by what you eat. Eating healthier foods in the appropriate amounts throughout the day at about the same time each day will help you control your blood glucose level. It can also help slow or prevent worsening of your diabetes mellitus. Healthy eating may even help you improve the level of your blood pressure and reach or maintain a healthy weight.  HOW CAN FOOD AFFECT ME? Carbohydrates Carbohydrates affect your blood glucose level more than any other type of food. Your dietitian will help  you determine how many carbohydrates to eat at each meal and teach you how to count carbohydrates. Counting carbohydrates is important to keep your blood glucose at a healthy level, especially if you are using insulin or taking certain medicines for diabetes mellitus. Alcohol Alcohol can cause sudden decreases in blood glucose (hypoglycemia), especially if you use insulin or take certain medicines for diabetes mellitus. Hypoglycemia can be a life-threatening condition. Symptoms of hypoglycemia (sleepiness, dizziness, and disorientation) are similar to symptoms of having too much alcohol.  If your health care provider has given you approval to drink alcohol, do so in moderation and use the following guidelines:  Women should not have more than one drink per day, and men should not have more than two drinks per day. One drink is  equal to:  12 oz of beer.  5 oz of wine.  1 oz of hard liquor.  Do not drink on an empty stomach.  Keep yourself hydrated. Have water, diet soda, or unsweetened iced tea.  Regular soda, juice, and other mixers might contain a lot of carbohydrates and should be counted. WHAT FOODS ARE NOT RECOMMENDED? As you make food choices, it is important to remember that all foods are not the same. Some foods have fewer nutrients per serving than other foods, even though they might have the same number of calories or carbohydrates. It is difficult to get your body what it needs when you eat foods with fewer nutrients. Examples of foods that you should avoid that are high in calories and carbohydrates but low in nutrients include:  Trans fats (most processed foods list trans fats on the Nutrition Facts label).  Regular soda.  Juice.  Candy.  Sweets, such as cake, pie, doughnuts, and cookies.  Fried foods. WHAT FOODS CAN I EAT? Have nutrient-rich foods, which will nourish your body and keep you healthy. The food you should eat also will depend on several factors,  including:  The calories you need.  The medicines you take.  Your weight.  Your blood glucose level.  Your blood pressure level.  Your cholesterol level. You also should eat a variety of foods, including:  Protein, such as meat, poultry, fish, tofu, nuts, and seeds (lean animal proteins are best).  Fruits.  Vegetables.  Dairy products, such as milk, cheese, and yogurt (low fat is best).  Breads, grains, pasta, cereal, rice, and beans.  Fats such as olive oil, trans fat-free margarine, canola oil, avocado, and olives. DOES EVERYONE WITH DIABETES MELLITUS HAVE THE SAME MEAL PLAN? Because every person with diabetes mellitus is different, there is not one meal plan that works for everyone. It is very important that you meet with a dietitian who will help you create a meal plan that is just right for you. Document Released: 10/31/2004 Document Revised: 02/08/2013 Document Reviewed: 12/31/2012 [facility] Patient Information 2015 [place], [place]. This information is not intended to replace advice given to you by your health care provider. Make sure you discuss any questions you have with your health care provider.

## 2013-11-14 LAB — GLUCOSE, CAPILLARY
GLUCOSE-CAPILLARY: 117 mg/dL — AB (ref 70–99)
Glucose-Capillary: 143 mg/dL — ABNORMAL HIGH (ref 70–99)
Glucose-Capillary: 101 mg/dL — ABNORMAL HIGH (ref 70–99)
Glucose-Capillary: 102 mg/dL — ABNORMAL HIGH (ref 70–99)

## 2013-11-14 LAB — CBC
HCT: 34.2 % — ABNORMAL LOW (ref 39.0–52.0)
Hemoglobin: 11.2 g/dL — ABNORMAL LOW (ref 13.0–17.0)
MCH: 28.4 pg (ref 26.0–34.0)
MCHC: 32.7 g/dL (ref 30.0–36.0)
MCV: 86.8 fL (ref 78.0–100.0)
Platelets: 321 10*3/uL (ref 150–400)
RBC: 3.94 MIL/uL — ABNORMAL LOW (ref 4.22–5.81)
RDW: 12.9 % (ref 11.5–15.5)
WBC: 8.4 10*3/uL (ref 4.0–10.5)

## 2013-11-14 LAB — BASIC METABOLIC PANEL
Anion gap: 13 (ref 5–15)
BUN: 20 mg/dL (ref 6–23)
CO2: 24 meq/L (ref 19–32)
Calcium: 8.3 mg/dL — ABNORMAL LOW (ref 8.4–10.5)
Chloride: 94 mEq/L — ABNORMAL LOW (ref 96–112)
Creatinine, Ser: 0.71 mg/dL (ref 0.50–1.35)
GFR calc Af Amer: >90 mL/min (ref >90)
Glucose, Bld: 148 mg/dL — ABNORMAL HIGH (ref 70–99)
Potassium: 5.1 mEq/L (ref 3.7–5.3)
Sodium: 131 mEq/L — ABNORMAL LOW (ref 137–147)

## 2013-11-14 MED ORDER — LISINOPRIL 5 MG PO TABS
5.0000 mg | ORAL_TABLET | Freq: Every day | ORAL | Status: DC
  Administered 2013-11-14 – 2013-11-15 (×2): 5 mg via ORAL
  Filled 2013-11-14 (×2): qty 1

## 2013-11-14 MED ORDER — POTASSIUM CHLORIDE CRYS ER 20 MEQ PO TBCR
20.0000 meq | EXTENDED_RELEASE_TABLET | Freq: Every day | ORAL | Status: DC
  Administered 2013-11-14 – 2013-11-15 (×2): 20 meq via ORAL
  Filled 2013-11-14: qty 1

## 2013-11-14 MED ORDER — FUROSEMIDE 40 MG PO TABS
40.0000 mg | ORAL_TABLET | Freq: Every day | ORAL | Status: DC
  Administered 2013-11-14 – 2013-11-15 (×2): 40 mg via ORAL
  Filled 2013-11-14 (×2): qty 1

## 2013-11-14 MED ORDER — INSULIN STARTER KIT- SYRINGES (ENGLISH)
1.0000 | Freq: Once | Status: AC
  Administered 2013-11-15: 1
  Filled 2013-11-14: qty 1

## 2013-11-14 MED ORDER — INSULIN DETEMIR 100 UNIT/ML ~~LOC~~ SOLN
10.0000 [IU] | Freq: Two times a day (BID) | SUBCUTANEOUS | Status: DC
  Administered 2013-11-14 (×2): 10 [IU] via SUBCUTANEOUS
  Filled 2013-11-14 (×4): qty 0.1

## 2013-11-14 NOTE — Progress Notes (Addendum)
       [address]       [place] [address]             [phone]          5 Days Post-Op Procedure(s) (LRB): CORONARY ARTERY BYPASS GRAFTING (CABG) (N/A) INTRAOPERATIVE TRANSESOPHAGEAL ECHOCARDIOGRAM (N/A)  Subjective: Feels well, wants to go home.   Objective: Vital signs in last 24 hours: Patient Vitals for the past 24 hrs:  BP Temp Temp src Pulse Resp SpO2 Weight  11/14/13 0421 145/82 mmHg 98.4 F (36.9 C) Oral 81 20 97 % 239 lb 10.2 oz (108.7 kg)  11/13/13 2019 125/83 mmHg 97.5 F (36.4 C) Oral 76 18 98 % -  11/13/13 1705 - 98.1 F (36.7 C) Oral - - - -  11/13/13 1500 131/80 mmHg 98.4 F (36.9 C) Oral 80 18 96 % -  11/13/13 0943 118/68 mmHg - - 76 18 - -  11/13/13 0900 131/67 mmHg - - 81 16 94 % -   Current Weight  11/14/13 239 lb 10.2 oz (108.7 kg)  PRE-OPERATIVE WEIGHT: 104kg    Intake/Output from previous day: 09/27 0701 - 09/28 0700 In: 240 [P.O.:240] Out: 5675 [Urine:4575; Stool:1100]  CBGs [phone]   PHYSICAL EXAM:  Heart: RRR Lungs: Diminished BS in R base Wound: +serous drainage from several points of the sternal incision, no erythema or purulence, leg wounds clean and dry Extremities: +LE edema    Lab Results: CBC: Recent Labs  11/12/13 0450 11/14/13 0622  WBC 8.0 8.4  HGB 10.6* 11.2*  HCT 32.4* 34.2*  PLT 174 321   BMET:  Recent Labs  11/13/13 0337 11/14/13 0040  NA 132* 131*  K 4.4 5.1  CL 94* 94*  CO2 25 24  GLUCOSE 235* 148*  BUN 27* 20  CREATININE 0.84 0.71  CALCIUM 7.9* 8.3*    PT/INR: No results found for this basename: LABPROT, INR,  in the last 72 hours    Assessment/Plan: S/P Procedure(s) (LRB): CORONARY ARTERY BYPASS GRAFTING (CABG) (N/A) INTRAOPERATIVE TRANSESOPHAGEAL ECHOCARDIOGRAM (N/A)  CV- AF, maintaining SR.  BPs trending up.  Continue Amio, Coreg, will add low dose ACE-I.  Vol overload- Excellent UOP, wt down 4 kg after IV Lasix/Zaroxyln yesterday.  Continue  diuresis, will switch to po Lasix.  DM- sugars generally stable. Back on home meds + Levemir + Novolog.  Will decrease Levemir and watch.  Sternal drainage- likely fat necrosis, no obvious purulence or erythema.  Continue Keflex, local wound care.  Possibly home later today vs in am.   LOS: 10 days    COLLINS,GINA H 11/14/2013  Patient seen and examined, agree with above I am not comfortable sending him home with ongoing sternal drainage- continue dressing changes and Keflex Home tomorrow if drainage resolves

## 2013-11-14 NOTE — Progress Notes (Signed)
[id number] Cardiac Rehab Went to ambulate pt. He declines stating that he did not sleep well last night. Pt is anxious to go home. RA sat 95%. Discussed smoking cessation with pt. I gave him tips for quitting, quit smart classes and coaching contact number. He states that his plan is to quit"cold [place]." Pt states that he has watched recovery from heart surgery video. Deon Pilling, RN 11/14/2013 9:53 AM

## 2013-11-14 NOTE — Progress Notes (Signed)
     Notes reviewed. Hopeful DC today.  AMIO, coreg. SR.  ACE-I just started, agree.   Candee Furbish, MD

## 2013-11-14 NOTE — Progress Notes (Addendum)
Inpatient Diabetes Program Recommendations  AACE/ADA: New Consensus Statement on Inpatient Glycemic Control (2013)  Target Ranges:  Prepandial:   less than 140 mg/dL      Peak postprandial:   less than 180 mg/dL (1-2 hours)      Critically ill patients:  140 - 180 mg/dL   Reason for Assessment: A1C of 9.3  Diabetes history: Type 2 Outpatient Diabetes medications: Glucophage 1000 mg bid Current orders for Inpatient glycemic control: Levemir 10 units bid, Novolog 6 units tid as meal coverage, Novolog Moderate correction scale tid, Glucophage 1000 mg bid  Note:  Patient was not on insulin prior to admission and glycemic control sub-optimal.  Also note that patient apparently did not take medications consistently prior to admission.  Patient has no insurance.  Per MD note, plan is for patient to be discharged on Metformin plus levemir and novolog.    Since patient has no insurance, will likely need medication assistance through Care Management to get the insulins.  Another alternative would be generic 70/30 insulin available through [facility] at about $25 per vial.  If 70/30 insulin is chosen, patient would need about 18 units with breakfast and supper to approximate current regimen with Levemir and Novolog.  Patient would need to be advised to eat meals at consistent times and possibly need snacks between meals.  Will definitely need an HS snack if discharged on 70/30.  Thank you.  Malka Bocek S. Marcelline Mates, RN, CNS, CDE Inpatient Diabetes Program, team pager [phone]  Addendum:  Had paged Suzzanne Cloud, PA earlier in afternoon to discuss insulin issue.  She was in OR.  Had not heard back from her so paged Jadene Pierini, [place] on call.  He suggested I go ahead and place Care Management consult regarding medication assistance and have nursing staff begin letting patient prepare and give own insulin.  Thank you.  Shakhia Gramajo S. Marcelline Mates, RN, CNS, CDE Inpatient Diabetes Program, team pager [phone]

## 2013-11-15 LAB — GLUCOSE, CAPILLARY
GLUCOSE-CAPILLARY: 123 mg/dL — AB (ref 70–99)
GLUCOSE-CAPILLARY: 236 mg/dL — AB (ref 70–99)

## 2013-11-15 MED ORDER — CEPHALEXIN 500 MG PO CAPS: 500.0000 mg | ORAL_CAPSULE | Freq: Three times a day (TID) | ORAL | Status: DC

## 2013-11-15 MED ORDER — OXYCODONE HCL 5 MG PO TABS: 5.0000 mg | ORAL_TABLET | ORAL | Status: DC | PRN

## 2013-11-15 MED ORDER — INSULIN STARTER KIT- SYRINGES (ENGLISH): 1.0000 | Freq: Once | Status: DC

## 2013-11-15 MED ORDER — ASPIRIN 325 MG PO TBEC: 325.0000 mg | DELAYED_RELEASE_TABLET | Freq: Every day | ORAL | Status: DC

## 2013-11-15 MED ORDER — LISINOPRIL 5 MG PO TABS: 5.0000 mg | ORAL_TABLET | Freq: Every day | ORAL | Status: DC

## 2013-11-15 MED ORDER — POTASSIUM CHLORIDE CRYS ER 20 MEQ PO TBCR: 20.0000 meq | EXTENDED_RELEASE_TABLET | Freq: Every day | ORAL | Status: DC

## 2013-11-15 MED ORDER — FUROSEMIDE 40 MG PO TABS: 40.0000 mg | ORAL_TABLET | Freq: Every day | ORAL | Status: DC

## 2013-11-15 MED ORDER — AMIODARONE HCL 400 MG PO TABS
400.0000 mg | ORAL_TABLET | Freq: Two times a day (BID) | ORAL | Status: DC
Start: 2013-11-15 — End: 2013-12-13

## 2013-11-15 MED ORDER — AMIODARONE HCL 400 MG PO TABS: 400.0000 mg | ORAL_TABLET | Freq: Two times a day (BID) | ORAL | Status: DC

## 2013-11-15 MED ORDER — CARVEDILOL 3.125 MG PO TABS: 3.1250 mg | ORAL_TABLET | Freq: Two times a day (BID) | ORAL | Status: DC

## 2013-11-15 MED ORDER — INSULIN ASPART PROT & ASPART (70-30 MIX) 100 UNIT/ML ~~LOC~~ SUSP: 15.0000 [IU] | Freq: Two times a day (BID) | SUBCUTANEOUS | Status: DC

## 2013-11-15 MED ORDER — INSULIN ASPART PROT & ASPART (70-30 MIX) 100 UNIT/ML ~~LOC~~ SUSP
15.0000 [IU] | Freq: Two times a day (BID) | SUBCUTANEOUS | Status: DC
  Filled 2013-11-15: qty 10

## 2013-11-15 NOTE — Progress Notes (Addendum)
       [address]       [place] [address]             [phone]          6 Days Post-Op Procedure(s) (LRB): CORONARY ARTERY BYPASS GRAFTING (CABG) (N/A) INTRAOPERATIVE TRANSESOPHAGEAL ECHOCARDIOGRAM (N/A)  Subjective: Stable night, no complaints.   Objective: Vital signs in last 24 hours: Patient Vitals for the past 24 hrs:  BP Temp Temp src Pulse Resp SpO2 Weight  11/15/13 0519 132/83 mmHg 98.3 F (36.8 C) Oral 92 18 98 % 234 lb 12.6 oz (106.5 kg)  11/14/13 2058 126/80 mmHg 97.5 F (36.4 C) Oral 78 18 99 % -  11/14/13 1727 142/83 mmHg - - 80 - - -  11/14/13 1300 132/78 mmHg 99.4 F (37.4 C) Oral 83 17 96 % -  11/14/13 1021 128/62 mmHg - - 82 - - -  11/14/13 0841 134/79 mmHg 98.5 F (36.9 C) Oral 83 16 98 % -   Current Weight  11/15/13 234 lb 12.6 oz (106.5 kg)     Intake/Output from previous day:   CBGs 102-117-123   PHYSICAL EXAM: Heart: RRR Lungs: Clear Wound: Sternal wound not actively draining, only a small amount of serous drainage on the dressing from yesterday.  Minimal erythema. Extremities: Mild LE edema    Lab Results: CBC: Recent Labs  11/14/13 0622  WBC 8.4  HGB 11.2*  HCT 34.2*  PLT 321   BMET:  Recent Labs  11/13/13 0337 11/14/13 0040  NA 132* 131*  K 4.4 5.1  CL 94* 94*  CO2 25 24  GLUCOSE 235* 148*  BUN 27* 20  CREATININE 0.84 0.71  CALCIUM 7.9* 8.3*    PT/INR: No results found for this basename: LABPROT, INR,  in the last 72 hours    Assessment/Plan: S/P Procedure(s) (LRB): CORONARY ARTERY BYPASS GRAFTING (CABG) (N/A) INTRAOPERATIVE TRANSESOPHAGEAL ECHOCARDIOGRAM (N/A) Sternal drainage improved, will continue Keflex and local wound care.  DM- sugars stable overnight. Appreciate diabetes coordinator note.  Discussed with DM and will change Levemir to 70/30 and continue Metformin. Plan d/c home today- will need outpatient diabetes follow up.   LOS: 11 days    COLLINS,GINA  H 11/15/2013  Drainage dramatically improved Home today Plan as outlined above

## 2013-11-15 NOTE — Care Management Note (Signed)
    Page 1 of 1   11/15/2013     12:23:52 PM CARE MANAGEMENT NOTE 11/15/2013  Patient:  Shane Frye, Shane Frye   Account Number:  [id number]  Date Initiated:  11/09/2013  Documentation initiated by:  Elissa Hefty  Subjective/Objective Assessment:   adm w stemi     Action/Plan:   lives w wife   Anticipated DC Date:  11/15/2013   Anticipated DC Plan:  [place]  CM consult  Medication Assistance  [facility]      Choice offered to / List presented to:             Status of service:  Completed, signed off Medicare Important Message given?   (If response is "NO", the following Medicare IM given date fields will be blank) Date Medicare IM given:   Medicare IM given by:   Date Additional Medicare IM given:   Additional Medicare IM given by:    Discharge Disposition:  HOME/SELF CARE  Per UR Regulation:  Reviewed for med. necessity/level of care/duration of stay  If discussed at [place] of Stay Meetings, dates discussed:   11/10/2013  11/15/2013    Comments:  11/15/13 Ellan Lambert, Rn, BSN [phone] Pt s/p CABG x 6 on 11/09/13.  Pt has been following up for PCP at [facility]. Next appt is October 12 at 4:00.  Pt reminded of appt and info put on AVS.  Pt has no insurance--he is eligible for med assist through [facility] program.  [facility] letter given with explanation of program benefits.  Pt plans to follow up with Health Dept for long term medication assistance.

## 2013-11-15 NOTE — Progress Notes (Signed)
DC IV and tele per MD orders and protocol; DC instructions reviewed with pt and family at bedside; paper prescriptions given to pt; education completed on insulin use; pt gave injection using normal saline-pt measured amount accurately and administered insulin properly; diabetes education videos watched by patient; patient advised to call with any questions.  Rowe Pavy, RN

## 2013-11-15 NOTE — Progress Notes (Addendum)
Spoke with patient about his diabetes and starting insulin at home.  Patient was diagnosed with diabetes in 2005.  Has been on Metformin at home.  Did not attend any outpatient classes at that time.  Does not have insurance.  Diabetes Coordinator recommended "generic" Reli-on [facility] 70/30 insulin since one vial is about $26.  Will also need Reli-on blood glucose meter, strips, and lancets that he can purchase at [facility].  Gave patient some written instructions on when to take insulin.  Told him that he would start the 70/30 insulin in the am (9/30) since he had Levemir last night that will last all day today.  Staff RN to follow up with discharge instructions and timing on insulin. Staff RN to have patient draw up and give an injection of saline to check his injection technique.  Gave patient information on free outpatient DM classes through [facility] that are held every third Thursday. Case manager to see patient.  Harvel Ricks RN BSN CDE

## 2013-11-22 ENCOUNTER — Telehealth: Payer: Self-pay | Admitting: *Deleted

## 2013-11-22 ENCOUNTER — Ambulatory Visit: Payer: Self-pay | Admitting: Cardiology

## 2013-11-22 DIAGNOSIS — G8918 Other acute postprocedural pain: Secondary | ICD-10-CM

## 2013-11-22 MED ORDER — OXYCODONE HCL 5 MG PO TABS: 5.0000 mg | ORAL_TABLET | ORAL | Status: DC | PRN

## 2013-11-22 NOTE — Telephone Encounter (Signed)
Mr Robbins has requested a refill for pain med s/p CABG.  I said a new script would be at the front desk for pick up and he agreed.

## 2013-11-29 ENCOUNTER — Telehealth: Payer: Self-pay | Admitting: *Deleted

## 2013-11-29 DIAGNOSIS — G8918 Other acute postprocedural pain: Secondary | ICD-10-CM

## 2013-11-29 MED ORDER — OXYCODONE HCL 5 MG PO TABS: 5.0000 mg | ORAL_TABLET | Freq: Four times a day (QID) | ORAL | Status: DC | PRN

## 2013-11-29 NOTE — Telephone Encounter (Signed)
Mr. Kuch has requested another pain med refill.  I will have Dr. Roxan Hockey sign a new RX and he agrees to come to the office for pick up.

## 2013-12-07 ENCOUNTER — Other Ambulatory Visit: Payer: Self-pay | Admitting: *Deleted

## 2013-12-07 ENCOUNTER — Telehealth: Payer: Self-pay | Admitting: *Deleted

## 2013-12-07 DIAGNOSIS — G8918 Other acute postprocedural pain: Secondary | ICD-10-CM

## 2013-12-07 MED ORDER — OXYCODONE HCL 5 MG PO TABS: 5.0000 mg | ORAL_TABLET | Freq: Four times a day (QID) | ORAL | Status: DC | PRN

## 2013-12-07 NOTE — Telephone Encounter (Signed)
Shane Frye has called requesting another pain med refill.  I told him a new RX would be available today at the front desk and he agreed.

## 2013-12-13 ENCOUNTER — Telehealth: Payer: Self-pay | Admitting: Cardiology

## 2013-12-13 MED ORDER — AMIODARONE HCL 400 MG PO TABS: 400.0000 mg | ORAL_TABLET | Freq: Two times a day (BID) | ORAL | Status: DC

## 2013-12-13 NOTE — Telephone Encounter (Signed)
Spoke with patient. He is about 1 month post-CABG and was told he would be out of work about 2-3 months. His amiodarone cost $255/month. Patient has been out of work and has no insurance. All of his other medications are on the $4 list at [facility]. He cannot afford amiodarone at this cost and has no means/assistance to borrow money from.   Called pharmacy and switched Rx to amiodarone 200mg  - take 2 tablets by mouth BID >> brought price down to $66.73/month Patient was informed of this, but still will not be able to afford this on an extended basis.   Will route to Dr. Katherene Ponto, PA (patient will see Lurena Joiner, [place] on 11/9) for advice on medication changes. Per pharmacy, there are no other mediations in same class on $4 list   Sotalol 80mg  is on $4 list (for 30 day supply)?

## 2013-12-13 NOTE — Telephone Encounter (Signed)
Mr.Molinari is calling because he is needing some more medication (Amiodarone 400mg  tablet twice daily) which is costing $255.00 and is wanting to know if he can get a coupon for this or something that is equivalent to it that will be more cost effective for him as of right now he has  no health insurance. Please call

## 2013-12-13 NOTE — Telephone Encounter (Signed)
This pt does not need to be on Amiodarone 400 mg BID, change this to Amiodarone 200 mg daily.  Kerin Ransom PA-C 12/13/2013 1:31 PM

## 2013-12-13 NOTE — Telephone Encounter (Signed)
Med list updated

## 2013-12-13 NOTE — Telephone Encounter (Signed)
Spoke with pharmacy and updated Rx to reflect amiodarone 200mg  once daily. Cost $18.96/month. Patient was notified.

## 2013-12-15 ENCOUNTER — Telehealth: Payer: Self-pay | Admitting: *Deleted

## 2013-12-15 ENCOUNTER — Other Ambulatory Visit: Payer: Self-pay | Admitting: Thoracic Surgery (Cardiothoracic Vascular Surgery)

## 2013-12-15 DIAGNOSIS — G8918 Other acute postprocedural pain: Secondary | ICD-10-CM

## 2013-12-15 DIAGNOSIS — Z951 Presence of aortocoronary bypass graft: Secondary | ICD-10-CM

## 2013-12-15 MED ORDER — OXYCODONE HCL 5 MG PO TABS: 5.0000 mg | ORAL_TABLET | Freq: Four times a day (QID) | ORAL | Status: DC | PRN

## 2013-12-15 NOTE — Telephone Encounter (Signed)
Mr. Savini requested another pain med refill and came to the office for a new Oxycodone script today.

## 2013-12-20 ENCOUNTER — Encounter: Payer: Self-pay | Admitting: Thoracic Surgery (Cardiothoracic Vascular Surgery)

## 2013-12-20 ENCOUNTER — Ambulatory Visit
Admission: RE | Admit: 2013-12-20 | Discharge: 2013-12-20 | Disposition: A | Discharge status: Home / Self Care | Payer: No Typology Code available for payment source | Source: Ambulatory Visit | Attending: Thoracic Surgery (Cardiothoracic Vascular Surgery) | Admitting: Thoracic Surgery (Cardiothoracic Vascular Surgery)

## 2013-12-20 ENCOUNTER — Ambulatory Visit (INDEPENDENT_AMBULATORY_CARE_PROVIDER_SITE_OTHER): Payer: No Typology Code available for payment source | Admitting: Thoracic Surgery (Cardiothoracic Vascular Surgery)

## 2013-12-20 VITALS — BP 148/90 | HR 72 | Resp 20 | Ht 69.0 in | Wt 238.0 lb

## 2013-12-20 DIAGNOSIS — I2510 Atherosclerotic heart disease of native coronary artery without angina pectoris: Secondary | ICD-10-CM

## 2013-12-20 DIAGNOSIS — Z951 Presence of aortocoronary bypass graft: Secondary | ICD-10-CM

## 2013-12-20 NOTE — Progress Notes (Signed)
HPI:  Shane Frye today for scheduled postoperative visit. He is a 48 year old gentleman with a history of uncontrolled diabetes and polysubstance abuse, who presented with a ST elevation MI in September. His pain resolved in the Cath Lab, however he was found to have severe three-vessel coronary disease. He underwent coronary bypass grafting 6 on 11/09/2013. His postoperative course was complicated by atrial fibrillation which resolved with amiodarone. He also had some sternal drainage which he was treated with Keflex. That resolved as well.  Since he has been home he has continued to have incisional pain. He says he is taking one oxycodone tablet about every 4 hours while awake. He is not sure how many pills he has left from his current prescription. He says that his blood sugars have been high intermittently. He had questions about how long he would need to be on insulin. He has been watching his diet. He says he has not smoked since he has been home.  Past Medical History  Diagnosis Date  . Diabetes mellitus   . Hypertension   . GERD (gastroesophageal reflux disease)   coronary artery disease Status post CABG 6 11/09/2013  Current Outpatient Prescriptions  Medication Sig Dispense Refill  . amiodarone (PACERONE) 200 MG tablet Take 200 mg by mouth daily.     Marland Kitchen aspirin EC 325 MG EC tablet Take 1 tablet (325 mg total) by mouth daily. 30 tablet 0  . carvedilol (COREG) 3.125 MG tablet Take 1 tablet (3.125 mg total) by mouth 2 (two) times daily with a meal. 60 tablet 1  . Choline Fenofibrate (TRILIPIX) 45 MG capsule Take 45 mg by mouth daily.    Marland Kitchen gabapentin (NEURONTIN) 300 MG capsule Take 300 mg by mouth at bedtime.     . insulin aspart protamine- aspart (NOVOLOG MIX 70/30) (70-30) 100 UNIT/ML injection Inject 0.15 mLs (15 Units total) into the skin 2 (two) times daily with a meal. 10 mL 11  . metFORMIN (GLUCOPHAGE) 1000 MG tablet Take 1,000 mg by mouth 2 (two) times daily with a meal.     . oxyCODONE (OXY IR/ROXICODONE) 5 MG immediate release tablet Take 1-2 tablets (5-10 mg total) by mouth every 6 (six) hours as needed for moderate pain. 40 tablet 0   No current facility-administered medications for this visit.    Physical Exam BP 148/90 mmHg  Pulse 72  Resp 20  Ht 5\' 9"  (1.753 m)  Wt 238 lb (107.956 kg)  BMI 35.13 kg/m2  SpO24 58% 48 year old man in no acute distress Alert and oriented 3 with no focal neurologic deficits Lungs clear with equal breath sounds bilaterally Cardiac regular rate and rhythm, normal S1 and S2, no rub Sternum stable, incision clean dry and intact Leg incisions healing well, no peripheral edema  Diagnostic Tests: CHEST 2 VIEW  COMPARISON: 11/12/2013.  FINDINGS: Cardiac silhouette is mildly enlarged. Changes from CABG surgery are stable. Normal mediastinal and hilar contours.  Lungs are clear. No pleural effusion or pneumothorax. There are old healed rib fractures on the left.  IMPRESSION: No acute cardiopulmonary disease.   Electronically Signed  By: Lajean Manes M.D.  On: 12/20/2013 09:50   Impression: 48 year old man with multiple cardiac risk factors including uncontrolled diabetes, hyperlipidemia, hypertension, and polysubstance abuse who presented with a STEMI in September. He had three-vessel coronary disease and underwent coronary bypass grafting 6 on September 23.  He has done reasonably well postoperatively. He does still have some incisional discomfort. 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 That should continue to improve over time. He is still using oxycodone frequently although he says he is only taking one tablet a time. He requested another prescription today, but cannot tell me how many he has left over from his prior prescription, so I did not give him one today although he may well  need another prescription and near future. I did advise him that we would like to have him off of the narcotics over the next 6 weeks or so.  I encouraged him to continue with exercise.  I emphasized the importance of diabetes control. He does not currently have a primary physician but is in the process of getting Medicaid. I also emphasized the importance of ongoing tobacco cessation.  He is not to lift anything over 10 pounds for another 2 weeks. Beyond that his activities are unrestricted. He should not drive until he is no longer needing to take oxycodone during the daytime.  Plan: He will follow-up with Dr. Acie Fredrickson. I will be happy to see him back in February further assistance with his care.

## 2013-12-22 ENCOUNTER — Other Ambulatory Visit: Payer: Self-pay

## 2013-12-22 DIAGNOSIS — G8918 Other acute postprocedural pain: Secondary | ICD-10-CM

## 2013-12-22 MED ORDER — OXYCODONE HCL 5 MG PO TABS: 5.0000 mg | ORAL_TABLET | Freq: Four times a day (QID) | ORAL | Status: DC | PRN

## 2013-12-22 NOTE — Telephone Encounter (Signed)
RX for oxycodone 5 mg po every 6-8 hours #40, no refill printed out and Dr Servando Snare signed. Pt will pick up on Friday

## 2013-12-26 ENCOUNTER — Encounter: Payer: Self-pay | Admitting: Cardiology

## 2013-12-26 ENCOUNTER — Ambulatory Visit (INDEPENDENT_AMBULATORY_CARE_PROVIDER_SITE_OTHER): Payer: Medicaid Other | Admitting: Cardiology

## 2013-12-26 VITALS — BP 128/90 | HR 64 | Ht 69.0 in | Wt 243.3 lb

## 2013-12-26 DIAGNOSIS — R0609 Other forms of dyspnea: Secondary | ICD-10-CM

## 2013-12-26 DIAGNOSIS — Z951 Presence of aortocoronary bypass graft: Secondary | ICD-10-CM

## 2013-12-26 DIAGNOSIS — R0600 Dyspnea, unspecified: Secondary | ICD-10-CM | POA: Insufficient documentation

## 2013-12-26 DIAGNOSIS — F17200 Nicotine dependence, unspecified, uncomplicated: Secondary | ICD-10-CM

## 2013-12-26 DIAGNOSIS — Z720 Tobacco use: Secondary | ICD-10-CM

## 2013-12-26 DIAGNOSIS — E0959 Drug or chemical induced diabetes mellitus with other circulatory complications: Secondary | ICD-10-CM

## 2013-12-26 DIAGNOSIS — I480 Paroxysmal atrial fibrillation: Secondary | ICD-10-CM

## 2013-12-26 DIAGNOSIS — I2510 Atherosclerotic heart disease of native coronary artery without angina pectoris: Secondary | ICD-10-CM

## 2013-12-26 DIAGNOSIS — I10 Essential (primary) hypertension: Secondary | ICD-10-CM

## 2013-12-26 MED ORDER — LISINOPRIL-HYDROCHLOROTHIAZIDE 10-12.5 MG PO TABS
1.0000 | ORAL_TABLET | Freq: Every day | ORAL | Status: DC
Start: 2013-12-26 — End: 2014-03-09

## 2013-12-26 NOTE — Assessment & Plan Note (Signed)
CABG x 6 11/10/13 after he presented with STEMI 11/04/13

## 2013-12-26 NOTE — Assessment & Plan Note (Signed)
Controlled.  

## 2013-12-26 NOTE — Progress Notes (Signed)
12/26/2013 Shane Frye [facility]   Jul 11, 1965  [id number]  Primary Physician No primary care provider on file. Primary Cardiologist: Dr Claiborne Billings  HPI:  This is a 48 yo man from [place] with a PMH significant for hypertension, type II diabetes, obesity, tobacco abuse, a strong family history of premature CAD and medical noncompliance. He presented to [facility] with a STEMI 11/04/13. He had been using cocaine the evening before. He was transferred to [facility] and taken directly to the cath lab. Cath showed severe diffuse 3 vessel CAD and he had CABG x 6 on 11/10/13. His EF at cath was 45%. He had PAF post op and was discharged on Amiodarone 400 mg BID. He did return for his f/u here on 10/6. He has followed up with the surgeon. He tells me he may have a fracture sternal wire. He complains of persistent DOE. He has had PFTs. He says he hasn't smoked since CABG.    Current Outpatient Prescriptions  Medication Sig Dispense Refill  . amiodarone (PACERONE) 200 MG tablet Take 200 mg by mouth daily.     Marland Kitchen aspirin EC 325 MG EC tablet Take 1 tablet (325 mg total) by mouth daily. 30 tablet 0  . carvedilol (COREG) 3.125 MG tablet Take 1 tablet (3.125 mg total) by mouth 2 (two) times daily with a meal. 60 tablet 1  . Choline Fenofibrate (TRILIPIX) 45 MG capsule Take 45 mg by mouth daily.    Marland Kitchen gabapentin (NEURONTIN) 300 MG capsule Take 300 mg by mouth at bedtime.     . insulin aspart protamine- aspart (NOVOLOG MIX 70/30) (70-30) 100 UNIT/ML injection Inject 0.15 mLs (15 Units total) into the skin 2 (two) times daily with a meal. (Patient taking differently: Inject 15 Units into the skin 3 (three) times daily. ) 10 mL 11  . metFORMIN (GLUCOPHAGE) 1000 MG tablet Take 1,000 mg by mouth 2 (two) times daily with a meal.    . oxyCODONE (OXY IR/ROXICODONE) 5 MG immediate release tablet Take 1-2 tablets (5-10 mg total) by mouth every 6 (six) hours as needed for moderate pain. 40 tablet 0   No current facility-administered medications  for this visit.    No Known Allergies  History   Social History  . Marital Status: Married    Spouse Name: N/A    Number of Children: N/A  . Years of Education: N/A   Occupational History  . Not on file.   Social History Main Topics  . Smoking status: Current Every Day Smoker -- 0.50 packs/day    Types: Cigarettes  . Smokeless tobacco: Former Systems developer    Quit date: 11/04/2013  . Alcohol Use: 1.2 oz/week    2 Cans of beer per week  . Drug Use: Yes     Comment: cocaine  . Sexual Activity: Not on file   Other Topics Concern  . Not on file   Social History Narrative     Review of Systems: General: negative for chills, fever, night sweats or weight changes.  Cardiovascular: negative for chest pain, dyspnea on exertion, edema, orthopnea, palpitations, paroxysmal nocturnal dyspnea or shortness of breath Dermatological: negative for rash Respiratory: negative for cough or wheezing Urologic: negative for hematuria Abdominal: negative for nausea, vomiting, diarrhea, bright red blood per rectum, melena, or hematemesis Neurologic: negative for visual changes, syncope, or dizziness All other systems reviewed and are otherwise negative except as noted above.    Blood pressure 128/90, pulse 64, height 5\' 9"  (1.753 m), weight 243 lb  4.8 oz (110.36 kg).  General appearance: alert, cooperative, no distress and morbidly obese Neck: no carotid bruit and no JVD Lungs: clear to auscultation bilaterally Heart: regular rate and rhythm  EKG NSR inferior Qs  ASSESSMENT AND PLAN:   Dyspnea on exertion He complains of DOE- same as pre CABG  S/P CABG x 6 CABG x 6 11/10/13 after he presented with STEMI 11/04/13  HTN (hypertension) Controlled  DM (diabetes mellitus) Type 2 NIDDM  Smoker Quit since CABG  PAF (paroxysmal atrial fibrillation) Post op- discharged on Amiodarone   PLAN  Add ACE/HCTZ combination with diabetes, HTN, and edema.  Check labs including TSH, CBC, BMP. and  echo in one week. F/U Dr Claiborne Billings in 4 weeks. He asked for pain medication for persistent chest sore ness and I referred him to the surgeon.   Liza Czerwinski KPA-C 12/26/2013 3:03 PM

## 2013-12-26 NOTE — Assessment & Plan Note (Signed)
Post op- discharged on Amiodarone

## 2013-12-26 NOTE — Assessment & Plan Note (Signed)
Type 2 NIDDM 

## 2013-12-26 NOTE — Assessment & Plan Note (Signed)
He complains of DOE- same as pre CABG

## 2013-12-26 NOTE — Patient Instructions (Signed)
Your physician has recommended you make the following change in your medication: start new lisinopril- hct prescription. This has already been sent to your pharmacy.  Your physician recommends that you return for lab work in: 1 week. This is not a fasting study. It is okay to eat prior to going to the lab.  Your physician has requested that you have an echocardiogram. Echocardiography is a painless test that uses sound waves to create images of your heart. It provides your doctor with information about the size and shape of your heart and how well your heart's chambers and valves are working. This procedure takes approximately one hour. There are no restrictions for this procedure. This will be scheduled in 1 week.  Your physician recommends that you schedule a follow-up appointment in: 4 weeks.

## 2013-12-26 NOTE — Assessment & Plan Note (Signed)
Quit since CABG

## 2013-12-27 ENCOUNTER — Telehealth: Payer: Self-pay | Admitting: Cardiology

## 2013-12-27 NOTE — Telephone Encounter (Signed)
I called patient to schedule ac echocardiogram that was ordered yesterday 12/26/13 and patient states that he thinks he was given an incorrect prescription yesterday.  Please call.

## 2013-12-27 NOTE — Telephone Encounter (Signed)
Pt states he was seen by Kerin Ransom PA-C on 12/26/13 and was prescribed new medication Lisinopril-HCTZ 10-12.5MG  po daily.  Pt asking if he should take his old prescription of Lisinopril 5 mg daily with the new med.  Looked at pts OV note and med history, and advised him that he should discontinue taking the old med Lisinopril 5 mg, and start new regimen Lisinopril-HCTZ 10-12.5 mg po daily, in place of this.  Pt verbalized understanding and agrees with this plan.

## 2014-01-02 ENCOUNTER — Other Ambulatory Visit: Payer: Self-pay | Admitting: *Deleted

## 2014-01-02 ENCOUNTER — Ambulatory Visit (HOSPITAL_COMMUNITY)
Admission: RE | Admit: 2014-01-02 | Discharge: 2014-01-02 | Disposition: A | Discharge status: Home / Self Care | Payer: Medicaid Other | Source: Ambulatory Visit | Attending: Internal Medicine | Admitting: Internal Medicine

## 2014-01-02 DIAGNOSIS — Z951 Presence of aortocoronary bypass graft: Secondary | ICD-10-CM

## 2014-01-02 DIAGNOSIS — I2510 Atherosclerotic heart disease of native coronary artery without angina pectoris: Secondary | ICD-10-CM

## 2014-01-02 DIAGNOSIS — I209 Angina pectoris, unspecified: Secondary | ICD-10-CM | POA: Insufficient documentation

## 2014-01-02 DIAGNOSIS — G8918 Other acute postprocedural pain: Secondary | ICD-10-CM

## 2014-01-02 DIAGNOSIS — R0609 Other forms of dyspnea: Secondary | ICD-10-CM

## 2014-01-02 MED ORDER — OXYCODONE HCL 5 MG PO TABS: 5.0000 mg | ORAL_TABLET | Freq: Four times a day (QID) | ORAL | Status: DC | PRN

## 2014-01-02 NOTE — Progress Notes (Signed)
2D Echo Performed 01/02/2014    Marygrace Drought, RCS

## 2014-01-16 ENCOUNTER — Other Ambulatory Visit: Payer: Self-pay | Admitting: *Deleted

## 2014-01-16 DIAGNOSIS — G8918 Other acute postprocedural pain: Secondary | ICD-10-CM

## 2014-01-16 MED ORDER — HYDROCODONE-ACETAMINOPHEN 7.5-325 MG PO TABS: 1.0000 | ORAL_TABLET | Freq: Four times a day (QID) | ORAL | Status: DC | PRN

## 2014-01-18 ENCOUNTER — Emergency Department (HOSPITAL_COMMUNITY)
Admission: EM | Admit: 2014-01-18 | Discharge: 2014-01-18 | Disposition: A | Discharge status: Home / Self Care | Payer: Medicaid Other | Attending: Emergency Medicine | Admitting: Emergency Medicine

## 2014-01-18 ENCOUNTER — Encounter (HOSPITAL_COMMUNITY): Payer: Self-pay | Admitting: *Deleted

## 2014-01-18 ENCOUNTER — Telehealth: Payer: Self-pay | Admitting: Orthopedic Surgery

## 2014-01-18 ENCOUNTER — Emergency Department (HOSPITAL_COMMUNITY): Payer: Medicaid Other

## 2014-01-18 DIAGNOSIS — Z794 Long term (current) use of insulin: Secondary | ICD-10-CM | POA: Diagnosis not present

## 2014-01-18 DIAGNOSIS — Z7982 Long term (current) use of aspirin: Secondary | ICD-10-CM | POA: Diagnosis not present

## 2014-01-18 DIAGNOSIS — E119 Type 2 diabetes mellitus without complications: Secondary | ICD-10-CM | POA: Diagnosis not present

## 2014-01-18 DIAGNOSIS — Z87891 Personal history of nicotine dependence: Secondary | ICD-10-CM | POA: Insufficient documentation

## 2014-01-18 DIAGNOSIS — Z8719 Personal history of other diseases of the digestive system: Secondary | ICD-10-CM | POA: Insufficient documentation

## 2014-01-18 DIAGNOSIS — I10 Essential (primary) hypertension: Secondary | ICD-10-CM | POA: Insufficient documentation

## 2014-01-18 DIAGNOSIS — I252 Old myocardial infarction: Secondary | ICD-10-CM | POA: Insufficient documentation

## 2014-01-18 DIAGNOSIS — Z79899 Other long term (current) drug therapy: Secondary | ICD-10-CM | POA: Diagnosis not present

## 2014-01-18 DIAGNOSIS — M25512 Pain in left shoulder: Secondary | ICD-10-CM | POA: Insufficient documentation

## 2014-01-18 HISTORY — DX: Acute myocardial infarction, unspecified: I21.9

## 2014-01-18 MED ORDER — NAPROXEN 500 MG PO TABS: 500.0000 mg | ORAL_TABLET | Freq: Two times a day (BID) | ORAL | Status: DC

## 2014-01-18 NOTE — ED Provider Notes (Signed)
CSN: [id number]     Arrival date & time 01/18/14  0810 History   First MD Initiated Contact with Patient 01/18/14 [phone]     Chief Complaint  Patient presents with  . Shoulder Pain     (Consider location/radiation/quality/duration/timing/severity/associated sxs/prior Treatment) Patient is a 48 y.o. male presenting with shoulder pain. The history is provided by the patient.  Shoulder Pain Location:  Shoulder Time since incident:  1 month Injury: no   Shoulder location:  L shoulder Pain details:    Onset quality:  Gradual   Duration:  1 month   Timing:  Constant   Progression:  Worsening Chronicity:  New Handedness:  Right-handed Dislocation: no   Foreign body present:  No foreign bodies Prior injury to area:  No Relieved by:  Nothing  Bj Morlock is a 48 y.o. male who presents to the ED with left shoulder pain that started over a month ago. He states that it has gotten worse and now he can not drink with his left hand due to pain. He feels that his grip on the let is less than his right. He complains of popping and cracking with range of motion of the left shoulder. The pain is in the joint.   Past Medical History  Diagnosis Date  . Diabetes mellitus   . Hypertension   . GERD (gastroesophageal reflux disease)   . Myocardial infarction 11/04/13   Past Surgical History  Procedure Laterality Date  . No past surgeries    . Cardioversion N/A 11/06/2013    Procedure: CARDIOVERSION;  Surgeon: Thayer Headings, MD;  Location: [place];  Service: Cardiovascular;  Laterality: N/A;  . Coronary artery bypass graft N/A 11/09/2013    Procedure: CORONARY ARTERY BYPASS GRAFTING (CABG);  Surgeon: Melrose Nakayama, MD;  Location: [place];  Service: Open Heart Surgery;  Laterality: N/A;  . Intraoperative transesophageal echocardiogram N/A 11/09/2013    Procedure: INTRAOPERATIVE TRANSESOPHAGEAL ECHOCARDIOGRAM;  Surgeon: Melrose Nakayama, MD;  Location: [place];  Service: Open Heart  Surgery;  Laterality: N/A;   Family History  Problem Relation Age of Onset  . Hypertension Paternal Grandfather   . Hypertension Brother   . Diabetes Brother    History  Substance Use Topics  . Smoking status: Former Smoker -- 0.50 packs/day    Types: Cigarettes    Quit date: 11/10/2013  . Smokeless tobacco: Former Systems developer    Quit date: 11/04/2013  . Alcohol Use: 1.2 oz/week    2 Cans of beer per week    Review of Systems Negative except as stated in HPI   Allergies  Review of patient's allergies indicates no known allergies.  Home Medications   Prior to Admission medications   Medication Sig Start Date End Date Taking? Authorizing Provider  amiodarone (PACERONE) 200 MG tablet Take 200 mg by mouth daily.     Historical Provider, MD  aspirin EC 325 MG EC tablet Take 1 tablet (325 mg total) by mouth daily. 11/15/13   Coolidge Breeze, PA-C  carvedilol (COREG) 3.125 MG tablet Take 1 tablet (3.125 mg total) by mouth 2 (two) times daily with a meal. 11/15/13   Coolidge Breeze, PA-C  Choline Fenofibrate (TRILIPIX) 45 MG capsule Take 45 mg by mouth daily.    Historical Provider, MD  gabapentin (NEURONTIN) 300 MG capsule Take 300 mg by mouth at bedtime.     Historical Provider, MD  HYDROcodone-acetaminophen (NORCO) 7.5-325 MG per tablet Take 1 tablet by mouth every 6 (six)  hours as needed for moderate pain. 01/16/14   Melrose Nakayama, MD  insulin aspart protamine- aspart (NOVOLOG MIX 70/30) (70-30) 100 UNIT/ML injection Inject 0.15 mLs (15 Units total) into the skin 2 (two) times daily with a meal. Patient taking differently: Inject 15 Units into the skin 3 (three) times daily.  11/15/13   Coolidge Breeze, PA-C  lisinopril-hydrochlorothiazide (PRINZIDE,ZESTORETIC) 10-12.5 MG per tablet Take 1 tablet by mouth daily. 12/26/13   Erlene Quan, PA-C  metFORMIN (GLUCOPHAGE) 1000 MG tablet Take 1,000 mg by mouth 2 (two) times daily with a meal.    Historical Provider, MD  oxyCODONE (OXY  IR/ROXICODONE) 5 MG immediate release tablet Take 1 tablet (5 mg total) by mouth every 6 (six) hours as needed for moderate pain. 01/02/14   Rexene Alberts, MD   BP 142/81 mmHg  Pulse 67  Temp(Src) 98 F (36.7 C) (Oral)  Resp 16  Ht 5\' 9"  (1.753 m)  Wt 230 lb (104.327 kg)  BMI 33.95 kg/m2  SpO2 98% Physical Exam  Constitutional: He is oriented to person, place, and time. He appears well-developed and well-nourished.  HENT:  Head: Normocephalic and atraumatic.  Eyes: EOM are normal.  Neck: Neck supple.  Cardiovascular: Normal rate.   Pulmonary/Chest: Effort normal.  Musculoskeletal:       Left shoulder: He exhibits decreased range of motion, tenderness, crepitus and pain. He exhibits no swelling, no effusion, no deformity, no laceration and normal pulse.  Pain with range of motion of the left shoulder. Radial pulses equal, adequate circulation. Increased pain with lifting hand to mouth or trying to raise hand over his head.   Neurological: He is alert and oriented to person, place, and time. No cranial nerve deficit or sensory deficit. Gait normal.  Minimal decreased grip left.   Skin: Skin is warm and dry.  Psychiatric: He has a normal mood and affect. His behavior is normal.  Nursing note and vitals reviewed.   ED Course  Procedures Dg Shoulder Left  01/18/2014   CLINICAL DATA:  Shoulder pain for 1 month. No acute injury. Initial encounter.  EXAM: LEFT SHOULDER - 2+ VIEW  COMPARISON:  Radiographs 04/11/2010.  FINDINGS: The mineralization and alignment are normal. There is no evidence of acute fracture or dislocation. There is stable mild glenohumeral and acromioclavicular degenerative changes. There is no significant narrowing of the subacromial space. Old left-sided rib fractures are noted. Patient is status post median sternotomy. Left basilar pulmonary opacity is attributed to suboptimal inspiration.  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 No acute osseous  findings demonstrated.   Electronically Signed   By: Camie Patience M.D.   On: 01/18/2014 08:53    MDM  48 y.o. male with left shoulder pain x 1 month. I have reviewed this patient's vital signs, nurses notes, appropriate imaging and discussed findings and plan of care with the patient. He voices understanding. Stable for discharge without neurovascular deficits. I discussed with the patient that although the x-ray shows no acute findings that it would not necessarily show a rotator cuff or other ligament injury and need for follow up with ortho. He agrees with plan. Placed in sling and ice prior to d/c.    Medication List    STOP taking these medications        oxyCODONE 5 MG immediate release tablet  Commonly known as:  Oxy IR/ROXICODONE      TAKE these medications        naproxen 500  MG tablet  Commonly known as:  NAPROSYN  Take 1 tablet (500 mg total) by mouth 2 (two) times daily.      ASK your doctor about these medications        amiodarone 200 MG tablet  Commonly known as:  PACERONE  Take 200 mg by mouth daily.     aspirin 325 MG EC tablet  Take 1 tablet (325 mg total) by mouth daily.     carvedilol 3.125 MG tablet  Commonly known as:  COREG  Take 1 tablet (3.125 mg total) by mouth 2 (two) times daily with a meal.     gabapentin 300 MG capsule  Commonly known as:  NEURONTIN  Take 300 mg by mouth at bedtime.     gemfibrozil 600 MG tablet  Commonly known as:  LOPID  Take 600 mg by mouth 2 (two) times daily before a meal.     HYDROcodone-acetaminophen 7.5-325 MG per tablet  Commonly known as:  NORCO  Take 1 tablet by mouth every 6 (six) hours as needed for moderate pain.     insulin aspart protamine- aspart (70-30) 100 UNIT/ML injection  Commonly known as:  NOVOLOG MIX 70/30  Inject 0.15 mLs (15 Units total) into the skin 2 (two) times daily with a meal.     lisinopril-hydrochlorothiazide 10-12.5 MG per tablet  Commonly known as:  PRINZIDE,ZESTORETIC  Take 1  tablet by mouth daily.     metFORMIN 1000 MG tablet  Commonly known as:  GLUCOPHAGE  Take 1,000 mg by mouth 2 (two) times daily with a meal.           Ashley Murrain, NP 01/18/14 [place] Knapp, MD 01/18/14 1531

## 2014-01-18 NOTE — ED Notes (Signed)
Pt co lt shoulder pain for "about a month, gradually getting worse with popping and cracking, cant raise my arm to the side and my grip is weak". Pt denies injury.

## 2014-01-18 NOTE — Telephone Encounter (Signed)
Nani Ravens at the [facility] gave verbal consent for Mr. Welch to be seen using NPI # [id number]

## 2014-01-18 NOTE — Telephone Encounter (Signed)
Appointment has been scheduled. Patient aware

## 2014-01-18 NOTE — Telephone Encounter (Signed)
Patient called to request appointment following Emergency Room visit at [facility] for problem of left shoulder pain, which he states has been increasing over time.  Offered appointment upon receipt of referral from primary care, [facility] Department.  Appointment pending.

## 2014-01-26 ENCOUNTER — Ambulatory Visit (INDEPENDENT_AMBULATORY_CARE_PROVIDER_SITE_OTHER): Payer: Medicaid Other | Admitting: Cardiovascular Disease

## 2014-01-26 ENCOUNTER — Encounter (HOSPITAL_COMMUNITY): Payer: Self-pay | Admitting: Cardiovascular Disease

## 2014-01-26 VITALS — BP 150/90 | HR 57 | Ht 69.0 in | Wt 251.3 lb

## 2014-01-26 DIAGNOSIS — I2510 Atherosclerotic heart disease of native coronary artery without angina pectoris: Secondary | ICD-10-CM

## 2014-01-26 DIAGNOSIS — E669 Obesity, unspecified: Secondary | ICD-10-CM

## 2014-01-26 DIAGNOSIS — E785 Hyperlipidemia, unspecified: Secondary | ICD-10-CM

## 2014-01-26 DIAGNOSIS — Z79899 Other long term (current) drug therapy: Secondary | ICD-10-CM

## 2014-01-26 DIAGNOSIS — I480 Paroxysmal atrial fibrillation: Secondary | ICD-10-CM

## 2014-01-26 DIAGNOSIS — I10 Essential (primary) hypertension: Secondary | ICD-10-CM

## 2014-01-26 DIAGNOSIS — R5383 Other fatigue: Secondary | ICD-10-CM

## 2014-01-26 DIAGNOSIS — R0602 Shortness of breath: Secondary | ICD-10-CM

## 2014-01-26 DIAGNOSIS — E1140 Type 2 diabetes mellitus with diabetic neuropathy, unspecified: Secondary | ICD-10-CM

## 2014-01-26 DIAGNOSIS — F1410 Cocaine abuse, uncomplicated: Secondary | ICD-10-CM

## 2014-01-26 MED ORDER — CARVEDILOL 6.25 MG PO TABS: 6.2500 mg | ORAL_TABLET | Freq: Two times a day (BID) | ORAL | Status: DC

## 2014-01-26 MED ORDER — ATORVASTATIN CALCIUM 20 MG PO TABS: 20.0000 mg | ORAL_TABLET | Freq: Every day | ORAL | Status: DC

## 2014-01-26 NOTE — Progress Notes (Signed)
Patient ID: Shane Frye, male   DOB: Sep 15, 1965, 48 y.o.   MRN: [id number]     HPI: Shane Frye is a 48 y.o. male who presents to the office today for a 3 month follow up cardiology evaluation following his recent emergent cardiac catheterization and ultimate CABG surgery.  [name] is a 48 year old gentleman from [place] who has a past medical history significant for hypertension, type 2 diabetes mellitus, obesity, use, strong family history for premature coronary artery disease, and medical noncompliance.  On 11/04/2013.  He presented to [facility] hospital in transfer from [facility] with possible  ST segment elevation anterior myocardial infarction.  Of note, the patient had taken cocaine the day before.  I performed emergent cardiac catheterization which revealed severe three-vessel CAD with evidence for coronary calcification.  There was an 80% eccentric hazy proximal LAD stenosis followed by 80% stenosis of the first diagonal branch, 80% mid LAD stenosis, 50% proximal circumflex stenosis with an 80% OM1 stenosis and 90% OM 2 stenosis and a dominant right coronary artery with 95% mid PDA stenosis and 90% distal RCA PLA stenoses.  Intervention was not performed and he was evaluated by Dr. Roxan Hockey and underwent CABG surgery 6 on 11/10/2013.  He develop transient PAF postoperatively and ultimately was discharged on amiodarone 400 mg twice a day.  He was seen by Kerin Ransom on 12/26/2013 and was maintaining sinus rhythm.  At that time, with his diabetes mellitus, hypertension, and edema, he was started on Prinzide.  Over the past month, he denies any recurrent episodes of chest pain.  He has been taking amiodarone at 2 mg daily in addition to carvedilol 3.125 twice a day.  He is on lisinopril with HIDA chlorothiazide 10/12.5 with resolution of prior edema.  He is diabetic and has been taking metformin 1000 mg twice a day.  He apparently has been taking gemfibrozil 600 mg twice a day for  hyperlipidemia.  He presents for evaluation.  Past Medical History  Diagnosis Date  . Diabetes mellitus   . Hypertension   . GERD (gastroesophageal reflux disease)   . Myocardial infarction 11/04/13    Past Surgical History  Procedure Laterality Date  . No past surgeries    . Cardioversion N/A 11/06/2013    Procedure: CARDIOVERSION;  Surgeon: Thayer Headings, MD;  Location: [place];  Service: Cardiovascular;  Laterality: N/A;  . Coronary artery bypass graft N/A 11/09/2013    Procedure: CORONARY ARTERY BYPASS GRAFTING (CABG);  Surgeon: Melrose Nakayama, MD;  Location: [place];  Service: Open Heart Surgery;  Laterality: N/A;  . Intraoperative transesophageal echocardiogram N/A 11/09/2013    Procedure: INTRAOPERATIVE TRANSESOPHAGEAL ECHOCARDIOGRAM;  Surgeon: Melrose Nakayama, MD;  Location: [place];  Service: Open Heart Surgery;  Laterality: N/A;  . Left heart catheterization with coronary angiogram N/A 11/04/2013    Procedure: LEFT HEART CATHETERIZATION WITH CORONARY ANGIOGRAM;  Surgeon: Troy Sine, MD;  Location: [facility] CATH LAB;  Service: Cardiovascular;  Laterality: N/A;    No Known Allergies  Current Outpatient Prescriptions  Medication Sig Dispense Refill  . amiodarone (PACERONE) 200 MG tablet Take 200 mg by mouth daily.     Marland Kitchen aspirin EC 325 MG EC tablet Take 1 tablet (325 mg total) by mouth daily. 30 tablet 0  . carvedilol (COREG) 3.125 MG tablet Take 1 tablet (3.125 mg total) by mouth 2 (two) times daily with a meal. 60 tablet 1  . gabapentin (NEURONTIN) 300 MG capsule Take 300 mg by mouth  at bedtime.    Marland Kitchen HYDROcodone-acetaminophen (NORCO) 7.5-325 MG per tablet Take 1 tablet by mouth every 6 (six) hours as needed for moderate pain. 40 tablet 0  . insulin aspart protamine- aspart (NOVOLOG MIX 70/30) (70-30) 100 UNIT/ML injection Inject 0.15 mLs (15 Units total) into the skin 2 (two) times daily with a meal. (Patient taking differently: Inject 15 Units into the skin 3 (three) times  daily. ) 10 mL 11  . lisinopril-hydrochlorothiazide (PRINZIDE,ZESTORETIC) 10-12.5 MG per tablet Take 1 tablet by mouth daily. 30 tablet 6  . metFORMIN (GLUCOPHAGE) 1000 MG tablet Take 1,000 mg by mouth 2 (two) times daily with a meal.    . naproxen (NAPROSYN) 500 MG tablet Take 1 tablet (500 mg total) by mouth 2 (two) times daily. 20 tablet 0  . atorvastatin (LIPITOR) 20 MG tablet Take 1 tablet (20 mg total) by mouth daily. 90 tablet 3  . carvedilol (COREG) 6.25 MG tablet Take 1 tablet (6.25 mg total) by mouth 2 (two) times daily. 180 tablet 3   No current facility-administered medications for this visit.    History   Social History  . Marital Status: Married    Spouse Name: N/A    Number of Children: N/A  . Years of Education: N/A   Occupational History  . Not on file.   Social History Main Topics  . Smoking status: Former Smoker -- 0.50 packs/day    Types: Cigarettes    Quit date: 11/10/2013  . Smokeless tobacco: Former Systems developer    Quit date: 11/04/2013  . Alcohol Use: 1.2 oz/week    2 Cans of beer per week  . Drug Use: Yes     Comment: cocaine  . Sexual Activity: Not on file   Other Topics Concern  . Not on file   Social History Narrative    Family History  Problem Relation Age of Onset  . Hypertension Paternal Grandfather   . Hypertension Brother   . Diabetes Brother     ROS General: Negative; No fevers, chills, or night sweats HEENT: Negative; No changes in vision or hearing, sinus congestion, difficulty swallowing Pulmonary: Negative; No cough, wheezing, shortness of breath, hemoptysis Cardiovascular: See HPI: No chest pain, presyncope, syncope, palpatations GI: Negative; No nausea, vomiting, diarrhea, or abdominal pain GU: Negative; No dysuria, hematuria, or difficulty voiding Musculoskeletal: Negative; no myalgias, joint pain, or weakness Hematologic: Negative; no easy bruising, bleeding Endocrine: Negative; no heat/cold intolerance; no diabetes, Neuro:  Negative; no changes in balance, headaches Skin: Negative; No rashes or skin lesions Psychiatric: Negative; No behavioral problems, depression Sleep: Negative; No snoring,  daytime sleepiness, hypersomnolence, bruxism, restless legs, hypnogognic hallucinations. Other comprehensive 14 point system review is negative   Physical Exam BP 150/90 mmHg  Pulse 57  Ht '5\' 9"'  (1.753 m)  Wt 251 lb 4.8 oz (113.989 kg)  BMI 37.09 kg/m2 General: Alert, oriented, no distress.  Skin: normal turgor, no rashes, warm and dry HEENT: Normocephalic, atraumatic. Pupils equal round and reactive to light; sclera anicteric; extraocular muscles intact, No lid lag; Nose without nasal septal hypertrophy; Mouth/Parynx benign; Mallinpatti scale 3 Neck: No JVD, no carotid bruits; normal carotid upstroke Lungs: clear to ausculatation and percussion bilaterally; no wheezing or rales, normal inspiratory and expiratory effort Chest wall: without tenderness to palpitation Heart: PMI not displaced, RRR, s1 s2 normal, 1/6 systolic murmur, No diastolic murmur, no rubs, gallops, thrills, or heaves Abdomen: soft, nontender; no hepatosplenomehaly, BS+; abdominal aorta nontender and not dilated by palpation. Back: no CVA  tenderness Pulses: 2+  Musculoskeletal: full range of motion, normal strength, no joint deformities Extremities: Pulses 2+, no clubbing cyanosis or edema, Homan's sign negative  Neurologic: grossly nonfocal; Cranial nerves grossly wnl Psychologic: Normal mood and affect   ECG (independently read by me): Sinus bradycardia 57 bpm.  PR interval 186 ms.  Poor R wave progression.  LABS:  BMET    Component Value Date/Time   NA 131* 11/14/2013 0040   K 5.1 11/14/2013 0040   CL 94* 11/14/2013 0040   CO2 24 11/14/2013 0040   GLUCOSE 148* 11/14/2013 0040   BUN 20 11/14/2013 0040   CREATININE 0.71 11/14/2013 0040   CALCIUM 8.3* 11/14/2013 0040   GFRNONAA >90 11/14/2013 0040   GFRAA >90 11/14/2013 0040      Hepatic Function Panel     Component Value Date/Time   PROT 6.3 11/08/2013 2135   ALBUMIN 3.1* 11/08/2013 2135   AST 42* 11/08/2013 2135   ALT 17 11/08/2013 2135   ALKPHOS 56 11/08/2013 2135   BILITOT 0.5 11/08/2013 2135     CBC    Component Value Date/Time   WBC 8.4 11/14/2013 0622   RBC 3.94* 11/14/2013 0622   HGB 11.2* 11/14/2013 0622   HCT 34.2* 11/14/2013 0622   PLT 321 11/14/2013 0622   MCV 86.8 11/14/2013 0622   MCH 28.4 11/14/2013 0622   MCHC 32.7 11/14/2013 0622   RDW 12.9 11/14/2013 0622     BNP No results found for: PROBNP  Lipid Panel     Component Value Date/Time   CHOL 149 11/06/2013 0300   TRIG 196* 11/06/2013 0300   HDL 44 11/06/2013 0300   CHOLHDL 3.4 11/06/2013 0300   VLDL 39 11/06/2013 0300   LDLCALC 66 11/06/2013 0300     RADIOLOGY: Dg Shoulder Left  01/18/2014   CLINICAL DATA:  Shoulder pain for 1 month. No acute injury. Initial encounter.  EXAM: LEFT SHOULDER - 2+ VIEW  COMPARISON:  Radiographs 04/11/2010.  FINDINGS: The mineralization and alignment are normal. There is no evidence of acute fracture or dislocation. There is stable mild glenohumeral and acromioclavicular degenerative changes. There is no significant narrowing of the subacromial space. Old left-sided rib fractures are noted. Patient is status post median sternotomy. Left basilar pulmonary opacity is attributed to suboptimal inspiration.  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 No acute osseous findings demonstrated.   Electronically Signed   By: Camie Patience M.D.   On: 01/18/2014 08:53      ASSESSMENT AND PLAN: Mr. Hyrum Shaneyfelt is a 48 year old gentleman with a cardiac risk factor profile notable for hypertension, diabetes mellitus, prior tobacco use, as well as previous use of cocaine. He was found to have severe three-vessel coronary obstructive disease at urgent cardiac catheterization 3 months ago and was treated with CABG surgery 6.  Presently,  he is maintaining sinus rhythm without recurrent atrial fibrillation.  His blood pressure today is slightly increased at 150/90.  He has not had any recurrent chest pain.  I am recommending that he reduce his amiodarone to 100 mg for the remainder of December and then commencing with January.  He will discontinue this altogether.  At that time, he will titrate his carvedilol to 6.25 mg twice a day.  He now is on ace inhibition with lisinopril HCT Z in light of his diabetes mellitus.  He no longer has peripheral edema.  I'm recommending that he discontinue gemfibrozil am starting him on atorvastatin 20 mg with target LDL less  than 70.  A complete set of blood work will be obtained in approximate 6-8 weeks including a CBC, hemoglobin A1c, C-met, TSH, and lipid studies.  In March, I'm scheduling him for an echo Doppler study to reassess LV function post revascularization.  I will see him in the office for follow-up evaluation.  Time spent: 30 minutes   Troy Sine, MD, [facility]  01/28/2014 6:19 PM

## 2014-01-26 NOTE — Patient Instructions (Signed)
Your physician recommends that you schedule a follow-up appointment in: march with Dr. Claiborne Billings  We are scheduling an Echo for march   Take amiodarone 100 mg for three weeks then stop taking  Stop taking gemfibrozil and start taking Atorvastatin 20 mg daily  After you stop taking Amiodarone in three weeks start taking coreg 6.25 mg two times a day  We are ordering labs for you to get done in two months

## 2014-01-28 ENCOUNTER — Encounter: Payer: Self-pay | Admitting: Cardiovascular Disease

## 2014-01-30 ENCOUNTER — Ambulatory Visit (INDEPENDENT_AMBULATORY_CARE_PROVIDER_SITE_OTHER): Payer: Medicaid Other | Admitting: Orthopedic Surgery

## 2014-01-30 ENCOUNTER — Encounter: Payer: Self-pay | Admitting: Orthopedic Surgery

## 2014-01-30 VITALS — BP 141/83 | Ht 69.0 in | Wt 251.3 lb

## 2014-01-30 DIAGNOSIS — M75102 Unspecified rotator cuff tear or rupture of left shoulder, not specified as traumatic: Secondary | ICD-10-CM

## 2014-01-30 DIAGNOSIS — M75100 Unspecified rotator cuff tear or rupture of unspecified shoulder, not specified as traumatic: Secondary | ICD-10-CM | POA: Insufficient documentation

## 2014-01-30 MED ORDER — NABUMETONE 500 MG PO TABS: 500.0000 mg | ORAL_TABLET | Freq: Two times a day (BID) | ORAL | Status: DC

## 2014-01-30 NOTE — Patient Instructions (Signed)
We will schedule MRI for you

## 2014-01-30 NOTE — Progress Notes (Signed)
Subjective:     Shane Frye is a 48 y.o. right handed male referred by [place] [facility] for evaluation and treatment left  shoulder pain. He just had a bypass operation in September so increased 3 months out from that. This is evaluated as a personal injury. The pain is described as aching, sharp, stabbing and throbbing.  The onset of the pain was gradual, starting about 6 months ago.  The pain occurs continuously .  Location is global. No history of dislocation. Symptoms are aggravated by all activities, reaching, lifting, repetitive use, work at or above shoulder height, difficulty sleeping on affected side. Symptoms are diminished by  avoiding the painful activities.   Limited activities include: all activities. moderate stiffness, moderate weakness, crepitus left subarchromial region noted is reported. 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  Related history: negative for prior surgery, trauma, arthritis or disorders. Outside reports reviewed: none.  The following portions of the patient's history were reviewed and updated as appropriate: allergies, current medications, past family history, past medical history, past social history, past surgical history and problem list.    Objective:    Blood pressure 141/83, height 5\' 9"  (1.753 m), weight 251 lb 4.8 oz (113.989 kg). Gen. appearance is normal The patient is alert and oriented person place and time Mood is normal affect is normal Ambulatory status normal  Left SHOULDER PALPATION tenderness around the peri-acromial region ROM his active range of motion is less than 90 flexion abduction with normal external rotation decreased internal rotation     Neer:   positive  Hawkins  positive  Stability:   normal  Apprehension Sign:   negative/negative  Atrophy:   none noted  Strength:  biceps 5/5, triceps 5/5, abduction 3/5, adduction 5/5, external rotation with shoulder at side 5/5, flexion 3/5   The  opposite shoulder was normal   SKIN he has a few pimple-like areas around his skin a few abrasions around the shoulder arm back thoracic region cervical spine skin normal right shoulder skin normal right arm normal CV 2+ symmetric pulses in each radial artery LYMPH axillary cervical and supraclavicular lymph nodes are normal SENSATION no deficits noted in either extremity and 2+ reflexes are noted DTR 2+ COORDINATION normal  CSPINE EVAL: Skin as stated muscle tension normal mild rotatory flexion-extension range of motion deficit probably age-related no palpable tenderness Thoracic spine normal Imaging X-Ray: outside films reviewed  AC Joint:narrowing mild  Glenohumeral joint: narrowing mild  Elevation humeral head: absent  Cuff arthropathy: absent    Assessment:    left rotator cuff tear    Plan:    I discussed the following treatment options: Cortisone injection discussed Physical Therapy discussed and ordered Exercise options discussed and encouraged Activity modifications discussed and recommended Medication options discussed and recommended Recommend MRI and Relafen 500 mg twice a day

## 2014-02-03 ENCOUNTER — Telehealth: Payer: Self-pay | Admitting: Family Medicine

## 2014-02-03 NOTE — Telephone Encounter (Signed)
Patient currently has medicaid. ASA, metformin, novolog, amiodorone, atorvastatin, lisinopril/hctz, carvediol, neurontin, hydroxyzine, protonix. Appointment scheduled for 05/26/2014 with Sabra Heck. Patient advised he may call back after the 1st of the year to see if Dr. Livia Snellen has any new patient appointments.

## 2014-02-06 ENCOUNTER — Telehealth: Payer: Self-pay | Admitting: Orthopedic Surgery

## 2014-02-06 ENCOUNTER — Other Ambulatory Visit: Payer: Self-pay | Admitting: *Deleted

## 2014-02-06 MED ORDER — NAPROXEN 500 MG PO TABS: 500.0000 mg | ORAL_TABLET | Freq: Two times a day (BID) | ORAL | Status: DC

## 2014-02-06 NOTE — Telephone Encounter (Signed)
WE ARE STILL WORKING ON MRI MEDICATION WAS CHANGED TO NAPROSYN DUE TO INSURANCE  CALLED PATIENT, NO ANSWER

## 2014-02-06 NOTE — Telephone Encounter (Signed)
Patient is calling asking if we could please contact [facility] regarding his Rx that Dr. Aline Brochure wrote him, the pharmacy states that they need more information from [place] before filling. And he is also asking about his MRI that was recommended please advise /Recommend MRI and Relafen 500 mg twice a day

## 2014-02-07 NOTE — Telephone Encounter (Signed)
Patient is aware 

## 2014-03-02 ENCOUNTER — Encounter: Payer: Self-pay | Admitting: Family Medicine

## 2014-03-02 ENCOUNTER — Ambulatory Visit (INDEPENDENT_AMBULATORY_CARE_PROVIDER_SITE_OTHER): Payer: Medicaid Other | Admitting: Family Medicine

## 2014-03-02 VITALS — BP 172/98 | HR 76 | Temp 97.5°F | Ht 69.0 in | Wt 257.0 lb

## 2014-03-02 DIAGNOSIS — E119 Type 2 diabetes mellitus without complications: Secondary | ICD-10-CM

## 2014-03-02 DIAGNOSIS — E1159 Type 2 diabetes mellitus with other circulatory complications: Secondary | ICD-10-CM | POA: Insufficient documentation

## 2014-03-02 DIAGNOSIS — K219 Gastro-esophageal reflux disease without esophagitis: Secondary | ICD-10-CM | POA: Insufficient documentation

## 2014-03-02 DIAGNOSIS — Z1211 Encounter for screening for malignant neoplasm of colon: Secondary | ICD-10-CM

## 2014-03-02 DIAGNOSIS — I10 Essential (primary) hypertension: Secondary | ICD-10-CM | POA: Insufficient documentation

## 2014-03-02 DIAGNOSIS — G4700 Insomnia, unspecified: Secondary | ICD-10-CM | POA: Insufficient documentation

## 2014-03-02 DIAGNOSIS — G629 Polyneuropathy, unspecified: Secondary | ICD-10-CM | POA: Insufficient documentation

## 2014-03-02 DIAGNOSIS — E118 Type 2 diabetes mellitus with unspecified complications: Secondary | ICD-10-CM

## 2014-03-02 DIAGNOSIS — IMO0002 Reserved for concepts with insufficient information to code with codable children: Secondary | ICD-10-CM | POA: Insufficient documentation

## 2014-03-02 LAB — POCT CBC
Granulocyte percent: 70.6 %G (ref 37–80)
HCT, POC: 44.4 % (ref 43.5–53.7)
HEMOGLOBIN: 13.5 g/dL — AB (ref 14.1–18.1)
Lymph, poc: 3 (ref 0.6–3.4)
MCH: 23.9 pg — AB (ref 27–31.2)
MCHC: 30.5 g/dL — AB (ref 31.8–35.4)
MCV: 78.4 fL — AB (ref 80–97)
MPV: 7.6 fL (ref 0–99.8)
PLATELET COUNT, POC: 307 10*3/uL (ref 142–424)
POC GRANULOCYTE: 8.1 — AB (ref 2–6.9)
POC LYMPH PERCENT: 26.1 %L (ref 10–50)
RBC: 5.7 M/uL (ref 4.69–6.13)
RDW, POC: 15.1 %
WBC: 11.5 10*3/uL — AB (ref 4.6–10.2)

## 2014-03-02 LAB — POCT GLYCOSYLATED HEMOGLOBIN (HGB A1C): Hemoglobin A1C: 6.8

## 2014-03-02 LAB — POCT UA - MICROALBUMIN: Microalbumin Ur, POC: 50 mg/L

## 2014-03-02 NOTE — Addendum Note (Signed)
Addended by: Ilean China on: 03/02/2014 09:00 AM   Modules accepted: Medications

## 2014-03-02 NOTE — Addendum Note (Signed)
Addended by: Pollyann Kennedy F on: 03/02/2014 09:10 AM   Modules accepted: Orders

## 2014-03-02 NOTE — Progress Notes (Signed)
   Subjective:    Patient ID: Shane Frye, male    DOB: 09-03-1965, 49 y.o.   MRN: [id number]  HPI    Review of Systems     Objective:   Physical Exam BP 172/98 mmHg  Pulse 76  Temp(Src) 97.5 F (36.4 C) (Oral)  Ht 5\' 9"  (1.753 m)  Wt 257 lb (116.574 kg)  BMI 37.93 kg/m2        Assessment & Plan:

## 2014-03-02 NOTE — Patient Instructions (Signed)
Schedule annual diabetic eye exam.  Diabetes and Exercise Exercising regularly is important. It is not just about losing weight. It has many health benefits, such as:  Improving your overall fitness, flexibility, and endurance.  Increasing your bone density.  Helping with weight control.  Decreasing your body fat.  Increasing your muscle strength.  Reducing stress and tension.  Improving your overall health. People with diabetes who exercise gain additional benefits because exercise:  Reduces appetite.  Improves the body's use of blood sugar (glucose).  Helps lower or control blood glucose.  Decreases blood pressure.  Helps control blood lipids (such as cholesterol and triglycerides).  Improves the body's use of the hormone insulin by:  Increasing the body's insulin sensitivity.  Reducing the body's insulin needs.  Decreases the risk for heart disease because exercising:  Lowers cholesterol and triglycerides levels.  Increases the levels of good cholesterol (such as high-density lipoproteins [HDL]) in the body.  Lowers blood glucose levels. YOUR ACTIVITY PLAN  Choose an activity that you enjoy and set realistic goals. Your health care provider or diabetes educator can help you make an activity plan that works for you. Exercise regularly as directed by your health care provider. This includes:  Performing resistance training twice a week such as push-ups, sit-ups, lifting weights, or using resistance bands.  Performing 150 minutes of cardio exercises each week such as walking, running, or playing sports.  Staying active and spending no more than 90 minutes at one time being inactive. Even short bursts of exercise are good for you. Three 10-minute sessions spread throughout the day are just as beneficial as a single 30-minute session. Some exercise ideas include:  Taking the dog for a walk.  Taking the stairs instead of the elevator.  Dancing to your favorite  song.  Doing an exercise video.  Doing your favorite exercise with a friend. RECOMMENDATIONS FOR EXERCISING WITH TYPE 1 OR TYPE 2 DIABETES   Check your blood glucose before exercising. If blood glucose levels are greater than 240 mg/dL, check for urine ketones. Do not exercise if ketones are present.  Avoid injecting insulin into areas of the body that are going to be exercised. For example, avoid injecting insulin into:  The arms when playing tennis.  The legs when jogging.  Keep a record of:  Food intake before and after you exercise.  Expected peak times of insulin action.  Blood glucose levels before and after you exercise.  The type and amount of exercise you have done.  Review your records with your health care provider. Your health care provider will help you to develop guidelines for adjusting food intake and insulin amounts before and after exercising.  If you take insulin or oral hypoglycemic agents, watch for signs and symptoms of hypoglycemia. They include:  Dizziness.  Shaking.  Sweating.  Chills.  Confusion.  Drink plenty of water while you exercise to prevent dehydration or heat stroke. Body water is lost during exercise and must be replaced.  Talk to your health care provider before starting an exercise program to make sure it is safe for you. Remember, almost any type of activity is better than none. Document Released: 04/26/2003 Document Revised: 06/20/2013 Document Reviewed: 07/13/2012 [facility] Patient Information 2015 [place], [place]. This information is not intended to replace advice given to you by your health care provider. Make sure you discuss any questions you have with your health care provider.

## 2014-03-02 NOTE — Progress Notes (Signed)
   Subjective:    Patient ID: Shane Frye, male    DOB: 05/31/1965, 49 y.o.   MRN: [id number]  HPI  49 year old gentleman here for first visit. 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 He is followed by cardiologist. Other problems include diabetes, hyperlipidemia, and hypertension, and obesity. He denies current chest but has not gotten his energy back since bypass surgery. He is getting some weight. He checks his blood sugars at home and fasting levels of been around 150. Last ate his last A1c apparently from hospital was 9.3.    Review of Systems  Constitutional: Positive for fatigue.  Respiratory: Negative.   Cardiovascular: Negative.   Gastrointestinal: Negative.   Genitourinary: Negative.        Objective:   Physical Exam  Constitutional: He appears well-developed.  Cardiovascular: Normal rate and regular rhythm.   Heart sounds are distant as he is very thick chested. He had been on amiodarone postop that has now been tapered off  Abdominal: Soft.     1. Encounter for screening colonoscopy  - Ambulatory referral to Gastroenterology  2. Type 2 diabetes mellitus with complication Referral to pharmacy clinician for help. He is on a 7030 mixture 3 times a day which is unusual to me - POCT glycosylated hemoglobin (Hb A1C) - POCT UA - Microalbumin  Shane Honour MD     Assessment & Plan:

## 2014-03-03 ENCOUNTER — Encounter (INDEPENDENT_AMBULATORY_CARE_PROVIDER_SITE_OTHER): Payer: Self-pay | Admitting: *Deleted

## 2014-03-03 LAB — LIPID PANEL
CHOLESTEROL TOTAL: 148 mg/dL (ref 100–199)
Chol/HDL Ratio: 2.8 ratio units (ref 0.0–5.0)
HDL: 53 mg/dL (ref >39)
LDL Calculated: 58 mg/dL (ref 0–99)
Triglycerides: 187 mg/dL — ABNORMAL HIGH (ref 0–149)
VLDL Cholesterol Cal: 37 mg/dL (ref 5–40)

## 2014-03-03 LAB — MICROALBUMIN, URINE: Microalbumin, Urine: 36 ug/mL — ABNORMAL HIGH (ref 0.0–17.0)

## 2014-03-09 ENCOUNTER — Other Ambulatory Visit (INDEPENDENT_AMBULATORY_CARE_PROVIDER_SITE_OTHER): Payer: Self-pay | Admitting: *Deleted

## 2014-03-09 ENCOUNTER — Telehealth: Payer: Self-pay | Admitting: Family Medicine

## 2014-03-09 DIAGNOSIS — Z1211 Encounter for screening for malignant neoplasm of colon: Secondary | ICD-10-CM

## 2014-03-09 MED ORDER — LISINOPRIL-HYDROCHLOROTHIAZIDE 10-12.5 MG PO TABS: 2.0000 | ORAL_TABLET | Freq: Every day | ORAL | Status: DC

## 2014-03-09 NOTE — Telephone Encounter (Signed)
Do not see in [facility] notes, someone else filled it last time

## 2014-03-20 ENCOUNTER — Telehealth (INDEPENDENT_AMBULATORY_CARE_PROVIDER_SITE_OTHER): Payer: Self-pay | Admitting: *Deleted

## 2014-03-20 DIAGNOSIS — Z1211 Encounter for screening for malignant neoplasm of colon: Secondary | ICD-10-CM

## 2014-03-20 NOTE — Telephone Encounter (Signed)
Patient needs trilyte 

## 2014-03-21 ENCOUNTER — Telehealth: Payer: Self-pay | Admitting: Family Medicine

## 2014-03-23 ENCOUNTER — Telehealth: Payer: Self-pay | Admitting: Family Medicine

## 2014-03-23 MED ORDER — PEG 3350-KCL-NA BICARB-NACL 420 G PO SOLR: 4000.0000 mL | Freq: Once | ORAL | Status: DC

## 2014-03-23 NOTE — Telephone Encounter (Signed)
Done for 1 year by Dr. Claiborne Billings, pt aware

## 2014-03-23 NOTE — Telephone Encounter (Signed)
MESSAGE ALREADY SENT TO RX POOL

## 2014-04-03 DIAGNOSIS — Z0271 Encounter for disability determination: Secondary | ICD-10-CM

## 2014-04-06 ENCOUNTER — Ambulatory Visit (INDEPENDENT_AMBULATORY_CARE_PROVIDER_SITE_OTHER): Payer: Medicaid Other | Admitting: Family Medicine

## 2014-04-06 ENCOUNTER — Encounter: Payer: Self-pay | Admitting: Family Medicine

## 2014-04-06 VITALS — BP 160/93 | HR 76 | Temp 97.2°F | Ht 69.0 in | Wt 265.0 lb

## 2014-04-06 DIAGNOSIS — Z23 Encounter for immunization: Secondary | ICD-10-CM

## 2014-04-06 DIAGNOSIS — E0959 Drug or chemical induced diabetes mellitus with other circulatory complications: Secondary | ICD-10-CM

## 2014-04-06 DIAGNOSIS — I10 Essential (primary) hypertension: Secondary | ICD-10-CM

## 2014-04-06 NOTE — Patient Instructions (Signed)
Pneumococcal Vaccine, Polyvalent suspension for injection What is this medicine? PNEUMOCOCCAL VACCINE, POLYVALENT (NEU mo KOK al vak SEEN, pol ee VEY luhnt) is a vaccine to prevent pneumococcus bacteria infection. These bacteria are a major cause of ear infections, 'Strep throat' infections, and serious pneumonia, meningitis, or blood infections worldwide. These vaccines help the body to produce antibodies (protective substances) that help your body defend against these bacteria. This vaccine is recommended for infants and young children. This vaccine will not treat an infection. This medicine may be used for other purposes; ask your health care provider or pharmacist if you have questions. COMMON BRAND NAME(S): Prevnar 13 What should I tell my health care provider before I take this medicine? They need to know if you have any of these conditions: -bleeding problems -fever -immune system problems -low platelet count in the blood -seizures -an unusual or allergic reaction to pneumococcal vaccine, diphtheria toxoid, other vaccines, latex, other medicines, foods, dyes, or preservatives -pregnant or trying to get pregnant -breast-feeding How should I use this medicine? This vaccine is for injection into a muscle. It is given by a health care professional. A copy of Vaccine Information Statements will be given before each vaccination. Read this sheet carefully each time. The sheet may change frequently. Talk to your pediatrician regarding the use of this medicine in children. While this drug may be prescribed for children as young as 38 weeks old for selected conditions, precautions do apply. Overdosage: If you think you have taken too much of this medicine contact a poison control center or emergency room at once. NOTE: This medicine is only for you. Do not share this medicine with others. What if I miss a dose? It is important not to miss your dose. Call your doctor or health care professional if  you are unable to keep an appointment. What may interact with this medicine? -medicines for cancer chemotherapy -medicines that suppress your immune function -medicines that treat or prevent blood clots like warfarin, enoxaparin, and dalteparin -steroid medicines like prednisone or cortisone This list may not describe all possible interactions. Give your health care provider a list of all the medicines, herbs, non-prescription drugs, or dietary supplements you use. Also tell them if you smoke, drink alcohol, or use illegal drugs. Some items may interact with your medicine. What should I watch for while using this medicine? Mild fever and pain should go away in 3 days or less. Report any unusual symptoms to your doctor or health care professional. What side effects may I notice from receiving this medicine? Side effects that you should report to your doctor or health care professional as soon as possible: -allergic reactions like skin rash, itching or hives, swelling of the face, lips, or tongue -breathing problems -confused -fever over 102 degrees F -pain, tingling, numbness in the hands or feet -seizures -unusual bleeding or bruising -unusual muscle weakness Side effects that usually do not require medical attention (report to your doctor or health care professional if they continue or are bothersome): -aches and pains -diarrhea -fever of 102 degrees F or less -headache -irritable -loss of appetite -pain, tender at site where injected -trouble sleeping This list may not describe all possible side effects. Call your doctor for medical advice about side effects. You may report side effects to FDA at 1-800-FDA-1088. Where should I keep my medicine? This does not apply. This vaccine is given in a clinic, pharmacy, doctor's office, or other health care setting and will not be stored at home. NOTE:  This sheet is a summary. It may not cover all possible information. If you have questions  about this medicine, talk to your doctor, pharmacist, or health care provider.  2015, Elsevier/Gold Standard. (2008-04-18 10:17:22)   Influenza Virus Vaccine injection What is this medicine? INFLUENZA VIRUS VACCINE (in floo EN zuh VAHY ruhs vak SEEN) helps to reduce the risk of getting influenza also known as the flu. The vaccine only helps protect you against some strains of the flu. This medicine may be used for other purposes; ask your health care provider or pharmacist if you have questions. COMMON BRAND NAME(S): Afluria, Agriflu, Fluarix, Fluarix Quadrivalent, FLUCELVAX, Flulaval, Fluvirin, Fluzone, Fluzone High-Dose, Fluzone Intradermal What should I tell my health care provider before I take this medicine? They need to know if you have any of these conditions: -bleeding disorder like hemophilia -fever or infection -Guillain-Barre syndrome or other neurological problems -immune system problems -infection with the human immunodeficiency virus (HIV) or AIDS -low blood platelet counts -multiple sclerosis -an unusual or allergic reaction to influenza virus vaccine, latex, other medicines, foods, dyes, or preservatives. Different brands of vaccines contain different allergens. Some may contain latex or eggs. Talk to your doctor about your allergies to make sure that you get the right vaccine. -pregnant or trying to get pregnant -breast-feeding How should I use this medicine? This vaccine is for injection into a muscle or under the skin. It is given by a health care professional. A copy of Vaccine Information Statements will be given before each vaccination. Read this sheet carefully each time. The sheet may change frequently. Talk to your healthcare provider to see which vaccines are right for you. Some vaccines should not be used in all age groups. Overdosage: If you think you have taken too much of this medicine contact a poison control center or emergency room at once. NOTE: This  medicine is only for you. Do not share this medicine with others. What if I miss a dose? This does not apply. What may interact with this medicine? -chemotherapy or radiation therapy -medicines that lower your immune system like etanercept, anakinra, infliximab, and adalimumab -medicines that treat or prevent blood clots like warfarin -phenytoin -steroid medicines like prednisone or cortisone -theophylline -vaccines This list may not describe all possible interactions. Give your health care provider a list of all the medicines, herbs, non-prescription drugs, or dietary supplements you use. Also tell them if you smoke, drink alcohol, or use illegal drugs. Some items may interact with your medicine. What should I watch for while using this medicine? Report any side effects that do not go away within 3 days to your doctor or health care professional. Call your health care provider if any unusual symptoms occur within 6 weeks of receiving this vaccine. You may still catch the flu, but the illness is not usually as bad. You cannot get the flu from the vaccine. The vaccine will not protect against colds or other illnesses that may cause fever. The vaccine is needed every year. What side effects may I notice from receiving this medicine? Side effects that you should report to your doctor or health care professional as soon as possible: -allergic reactions like skin rash, itching or hives, swelling of the face, lips, or tongue Side effects that usually do not require medical attention (report to your doctor or health care professional if they continue or are bothersome): -fever -headache -muscle aches and pains -pain, tenderness, redness, or swelling at the injection site -tiredness This list may not  describe all possible side effects. Call your doctor for medical advice about side effects. You may report side effects to FDA at 1-800-FDA-1088. Where should I keep my medicine? The vaccine will be  given by a health care professional in a clinic, pharmacy, doctor's office, or other health care setting. You will not be given vaccine doses to store at home. NOTE: This sheet is a summary. It may not cover all possible information. If you have questions about this medicine, talk to your doctor, pharmacist, or health care provider.  2015, Elsevier/Gold Standard. (2011-08-14 74:71:85)

## 2014-04-06 NOTE — Progress Notes (Signed)
Subjective:    Patient ID: Shane Frye, male    DOB: 1966/02/10, 49 y.o.   MRN: [id number]  HPI  49 year old gentleman here to follow-up diabetes, coronary disease and hypertension,. Today his blood pressure is elevated. It was elevated some last visit but more today and we've made some recommendations to try to lower blood pressure. He is also having some chest pain at rest this is worrisome. He has appointment with cardiology next month to follow-up. Still does not have energy. He is still having a problem controlling his weight although his blood sugars have been generally less than 150 fasting  Patient Active Problem List   Diagnosis Date Noted  . Hypertension   . GERD (gastroesophageal reflux disease)   . Diabetes   . Insomnia   . Neuropathy   . Rotator cuff tear 01/30/2014  . Dyspnea on exertion 12/26/2013  . Smoker 12/26/2013  . PAF (paroxysmal atrial fibrillation) 12/26/2013  . 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 STEMI (ST elevation myocardial infarction) 11/04/2013  . HTN (hypertension) 11/04/2013  . DM (diabetes mellitus) 11/04/2013  . HLD (hyperlipidemia) 11/04/2013  . Obesity 11/04/2013  . Cocaine abuse 11/04/2013  . H/O noncompliance with medical treatment, presenting hazards to health 11/04/2013  . CAD (coronary artery disease), native coronary artery 11/04/2013   Outpatient Encounter Prescriptions as of 04/06/2014  Medication Sig  . aspirin EC 325 MG EC tablet Take 1 tablet (325 mg total) by mouth daily.  Marland Kitchen atorvastatin (LIPITOR) 20 MG tablet Take 1 tablet (20 mg total) by mouth daily.  . carvedilol (COREG) 6.25 MG tablet Take 1 tablet (6.25 mg total) by mouth 2 (two) times daily.  . citalopram (CELEXA) 20 MG tablet Take 20 mg by mouth daily.  Marland Kitchen gabapentin (NEURONTIN) 300 MG capsule Take 300 mg by mouth at bedtime.  . insulin aspart protamine- aspart (NOVOLOG MIX 70/30) (70-30) 100 UNIT/ML injection Inject 0.15 mLs (15 Units total) into the skin 2 (two) times daily with a  meal. (Patient taking differently: Inject 15 Units into the skin 3 (three) times daily. )  . lisinopril-hydrochlorothiazide (PRINZIDE,ZESTORETIC) 10-12.5 MG per tablet Take 2 tablets by mouth daily.  . metFORMIN (GLUCOPHAGE) 1000 MG tablet Take 1,000 mg by mouth 2 (two) times daily with a meal.  . pantoprazole (PROTONIX) 40 MG tablet Take 40 mg by mouth daily.  . [DISCONTINUED] hydrOXYzine (ATARAX/VISTARIL) 25 MG tablet Take 25 mg by mouth at bedtime.  . [DISCONTINUED] naproxen (NAPROSYN) 500 MG tablet Take 1 tablet (500 mg total) by mouth 2 (two) times daily.  . [DISCONTINUED] polyethylene glycol-electrolytes (NULYTELY/GOLYTELY) 420 G solution Take 4,000 mLs by mouth once.     Review of Systems  Cardiovascular: Positive for chest pain.  Neurological: Positive for numbness.       Objective:   Physical Exam  Constitutional: He is oriented to person, place, and time. He appears well-developed and well-nourished.  HENT:  Head: Normocephalic.  Cardiovascular: Normal rate and normal heart sounds.   Pulmonary/Chest: Effort normal.  Abdominal: Soft.  Neurological: He is alert and oriented to person, place, and time.  He complains of numbness in his hands bilaterally positive there is a positive Tinel test on the right as well as positive Phalen's test suggesting carpal tunnel    BP 160/93 mmHg  Pulse 76  Temp(Src) 97.2 F (36.2 C) (Oral)  Ht 5\' 9"  (1.753 m)  Wt 265 lb (120.203 kg)  BMI 39.12 kg/m2  1. Essential hypertension Blood pressure is elevated. Continue with lisinopril 20 mg/25 hydrochlorothiazide. Will increase carvedilol to 12.5 mg twice a day. 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

## 2014-04-11 ENCOUNTER — Telehealth (INDEPENDENT_AMBULATORY_CARE_PROVIDER_SITE_OTHER): Payer: Self-pay | Admitting: *Deleted

## 2014-04-11 NOTE — Telephone Encounter (Signed)
Referring MD/PCP: Alain Honey   Procedure: tcs  Reason/Indication:  screening  Has patient had this procedure before?  no  If so, when, by whom and where?    Is there a family history of colon cancer?  no  Who?  What age when diagnosed?    Is patient diabetic?   yes      Does patient have prosthetic heart valve?  no  Do you have a pacemaker?  no  Has patient ever had endocarditis? no  Has patient had joint replacement within last 12 months?  no  Does patient tend to be constipated or take laxatives? no  Is patient on Coumadin, Plavix and/or Aspirin? yes  Medications: see epic  Allergies: nkda  Medication Adjustment: asa 2 days, hold diabetic meds evening before and morning of  Procedure date & time: 05/11/14 at 930

## 2014-04-11 NOTE — Telephone Encounter (Signed)
agree

## 2014-04-14 ENCOUNTER — Telehealth: Payer: Self-pay | Admitting: Family Medicine

## 2014-04-14 NOTE — Telephone Encounter (Signed)
Okay to refill carvedilol 12.5 mg twice a day #60

## 2014-04-14 NOTE — Telephone Encounter (Signed)
Please review and advise.

## 2014-04-17 ENCOUNTER — Ambulatory Visit (HOSPITAL_COMMUNITY)
Admission: RE | Admit: 2014-04-17 | Discharge: 2014-04-17 | Disposition: A | Discharge status: Home / Self Care | Payer: Medicaid Other | Source: Ambulatory Visit | Attending: Cardiovascular Disease | Admitting: Cardiovascular Disease

## 2014-04-17 DIAGNOSIS — R0602 Shortness of breath: Secondary | ICD-10-CM | POA: Diagnosis not present

## 2014-04-17 NOTE — Progress Notes (Signed)
2D Echo Performed 04/17/2014    Marygrace Drought, RCS

## 2014-04-18 ENCOUNTER — Encounter: Payer: Self-pay | Admitting: *Deleted

## 2014-04-20 ENCOUNTER — Other Ambulatory Visit: Payer: Self-pay | Admitting: *Deleted

## 2014-04-20 MED ORDER — CARVEDILOL 12.5 MG PO TABS: 12.5000 mg | ORAL_TABLET | Freq: Two times a day (BID) | ORAL | Status: DC

## 2014-04-26 ENCOUNTER — Ambulatory Visit (HOSPITAL_COMMUNITY): Payer: Medicaid Other

## 2014-05-08 ENCOUNTER — Ambulatory Visit (INDEPENDENT_AMBULATORY_CARE_PROVIDER_SITE_OTHER): Payer: Self-pay | Admitting: Cardiovascular Disease

## 2014-05-08 VITALS — BP 142/94 | HR 70 | Ht 69.0 in | Wt 263.0 lb

## 2014-05-08 DIAGNOSIS — I10 Essential (primary) hypertension: Secondary | ICD-10-CM

## 2014-05-08 DIAGNOSIS — Z951 Presence of aortocoronary bypass graft: Secondary | ICD-10-CM

## 2014-05-08 DIAGNOSIS — I480 Paroxysmal atrial fibrillation: Secondary | ICD-10-CM

## 2014-05-08 DIAGNOSIS — I2510 Atherosclerotic heart disease of native coronary artery without angina pectoris: Secondary | ICD-10-CM

## 2014-05-08 DIAGNOSIS — Z79899 Other long term (current) drug therapy: Secondary | ICD-10-CM

## 2014-05-08 DIAGNOSIS — E785 Hyperlipidemia, unspecified: Secondary | ICD-10-CM

## 2014-05-08 MED ORDER — HYDROCHLOROTHIAZIDE 25 MG PO TABS: 25.0000 mg | ORAL_TABLET | Freq: Every day | ORAL | Status: DC

## 2014-05-08 MED ORDER — LISINOPRIL 40 MG PO TABS: 40.0000 mg | ORAL_TABLET | Freq: Every day | ORAL | Status: DC

## 2014-05-08 NOTE — Patient Instructions (Addendum)
Your physician wants you to follow-up in: 4 Months You will receive a reminder letter in the mail two months in advance. If you don't receive a letter, please call our office to schedule the follow-up appointment.  Your physician has recommended you make the following change in your medication: Stop full dose Aspirin and take an 81 mg daily, Stop lisinopril/HCTz and Start lisinopril 40 mg daily and HCTz 25 mg daily  Your physician recommends that you return for lab work in: in 2 Weeks BMP

## 2014-05-10 ENCOUNTER — Encounter: Payer: Self-pay | Admitting: Cardiovascular Disease

## 2014-05-10 NOTE — Progress Notes (Signed)
Patient ID: Shane Frye, male   DOB: 28-Aug-1965, 49 y.o.   MRN: [id number]     HPI: Shane Frye is a 49 y.o. male who presents to the office today for a 4 month follow up cardiology evaluation.  [name] has a past medical history significant for hypertension, type 2 diabetes mellitus, obesity, strong family history for premature coronary artery disease, and medical noncompliance.  On 11/04/2013 he presented to [facility] hospital in transfer from [facility] with possible  ST segment elevation anterior myocardial infarction.  He had taken cocaine the day before.  I performed emergent cardiac catheterization which revealed severe three-vessel CAD with evidence for coronary calcification.  There was an 80% eccentric hazy proximal LAD stenosis followed by 80% stenosis of the first diagonal branch, 80% mid LAD stenosis, 50% proximal circumflex stenosis with an 80% OM1 stenosis and 90% OM 2 stenosis and a dominant right coronary artery with 95% mid PDA stenosis and 90% distal RCA PLA stenoses.  Intervention was not performed and he was evaluated by Dr. Roxan Hockey and underwent CABG surgery 6 on 11/10/2013.  He develop transient PAF postoperatively and ultimately was discharged on amiodarone 400 mg twice a day.  He was seen by Kerin Ransom on 12/26/2013 and was maintaining sinus rhythm.  At that time, with his diabetes mellitus, hypertension, and edema, he was started on Prinzide.  I saw in follow-up 4 months ago and he  denied any recurrent episodes of chest pain.  He had been taking amiodarone at 200 mg daily in addition to carvedilol 3.125 twice a day and was on lisinopril/HCTZ 10/12.5 daily, and recently he has been taking 2 tablets by mouth daily.  He is diabetic and has been taking metformin 1000 mg twice a day.  He apparently has been taking gemfibrozil 600 mg twice a day for hyperlipidemia.  At that time, I recommended he discontinue gemfibrozil and started him on atorvastatin 20 mg daily for more  aggressive lipid-lowering.  I recommended follow-up laboratory which was performed 2 months ago.  His cholesterol was 148, triglycerides 187 HDL 53, and LDL 58.  On his atorvastatin treatment.  On urinalysis , he had evidence for microalbuminuria at 36 g per mL.  He tells me he has lost his job.  He denies any weight loss.  He denies recurrent chest pain.  He notes occasional leg cramps.  He presents for evaluation.  Past Medical History  Diagnosis Date  . Depression   . Insomnia   . Neuropathy   . Hypertension   . GERD (gastroesophageal reflux disease)   . Diabetes mellitus   . Myocardial infarction 11/04/13    Past Surgical History  Procedure Laterality Date  . No past surgeries    . Cardioversion N/A 11/06/2013    Procedure: CARDIOVERSION;  Surgeon: Thayer Headings, MD;  Location: [place];  Service: Cardiovascular;  Laterality: N/A;  . Coronary artery bypass graft N/A 11/09/2013    Procedure: CORONARY ARTERY BYPASS GRAFTING (CABG);  Surgeon: Melrose Nakayama, MD;  Location: [place];  Service: Open Heart Surgery;  Laterality: N/A;  . Intraoperative transesophageal echocardiogram N/A 11/09/2013    Procedure: INTRAOPERATIVE TRANSESOPHAGEAL ECHOCARDIOGRAM;  Surgeon: Melrose Nakayama, MD;  Location: [place];  Service: Open Heart Surgery;  Laterality: N/A;  . Left heart catheterization with coronary angiogram N/A 11/04/2013    Procedure: LEFT HEART CATHETERIZATION WITH CORONARY ANGIOGRAM;  Surgeon: Troy Sine, MD;  Location: [facility] CATH LAB;  Service: Cardiovascular;  Laterality: N/A;  No Known Allergies  Current Outpatient Prescriptions  Medication Sig Dispense Refill  . atorvastatin (LIPITOR) 20 MG tablet Take 1 tablet (20 mg total) by mouth daily. 90 tablet 3  . carvedilol (COREG) 12.5 MG tablet Take 1 tablet (12.5 mg total) by mouth 2 (two) times daily. 180 tablet 0  . citalopram (CELEXA) 20 MG tablet Take 20 mg by mouth daily.    Marland Kitchen gabapentin (NEURONTIN) 300 MG capsule Take 300  mg by mouth at bedtime.    . insulin aspart protamine- aspart (NOVOLOG MIX 70/30) (70-30) 100 UNIT/ML injection Inject 0.15 mLs (15 Units total) into the skin 2 (two) times daily with a meal. (Patient taking differently: Inject 15 Units into the skin 3 (three) times daily. ) 10 mL 11  . metFORMIN (GLUCOPHAGE) 1000 MG tablet Take 1,000 mg by mouth 2 (two) times daily with a meal.    . pantoprazole (PROTONIX) 40 MG tablet Take 40 mg by mouth daily.    . hydrochlorothiazide (HYDRODIURIL) 25 MG tablet Take 1 tablet (25 mg total) by mouth daily. 30 tablet 11  . lisinopril (PRINIVIL,ZESTRIL) 40 MG tablet Take 1 tablet (40 mg total) by mouth daily. 30 tablet 11   No current facility-administered medications for this visit.    History   Social History  . Marital Status: Married    Spouse Name: N/A  . Number of Children: N/A  . Years of Education: N/A   Occupational History  . Not on file.   Social History Main Topics  . Smoking status: Former Smoker -- 0.50 packs/day    Types: Cigarettes    Quit date: 11/10/2013  . Smokeless tobacco: Former Systems developer    Quit date: 11/04/2013  . Alcohol Use: No  . Drug Use: No     Comment: cocaine  . Sexual Activity: Not on file   Other Topics Concern  . Not on file   Social History Narrative    Family History  Problem Relation Age of Onset  . Hypertension Paternal Grandfather   . Hypertension Brother   . Diabetes Brother     ROS General: Negative; No fevers, chills, or night sweats HEENT: Negative; No changes in vision or hearing, sinus congestion, difficulty swallowing Pulmonary: Negative; No cough, wheezing, shortness of breath, hemoptysis Cardiovascular: See HPI: No chest pain, presyncope, syncope, palpatations GI: Negative; No nausea, vomiting, diarrhea, or abdominal pain GU: Negative; No dysuria, hematuria, or difficulty voiding Musculoskeletal: Negative; no myalgias, joint pain, or weakness Hematologic: Negative; no easy bruising,  bleeding Endocrine: Negative; no heat/cold intolerance; no diabetes, Neuro: Negative; no changes in balance, headaches Skin: Negative; No rashes or skin lesions Psychiatric: Negative; No behavioral problems, depression Sleep: Negative; No snoring,  daytime sleepiness, hypersomnolence, bruxism, restless legs, hypnogognic hallucinations. Other comprehensive 14 point system review is negative   Physical Exam BP 142/94 mmHg  Pulse 70  Ht 5\' 9"  (1.753 m)  Wt 263 lb (119.296 kg)  BMI 38.82 kg/m2 General: Alert, oriented, no distress.  Skin: normal turgor, no rashes, warm and dry HEENT: Normocephalic, atraumatic. Pupils equal round and reactive to light; sclera anicteric; extraocular muscles intact, No lid lag; Nose without nasal septal hypertrophy; Mouth/Parynx benign; Mallinpatti scale 3 Neck: No JVD, no carotid bruits; normal carotid upstroke Lungs: clear to ausculatation and percussion bilaterally; no wheezing or rales, normal inspiratory and expiratory effort Chest wall: without tenderness to palpitation Heart: PMI not displaced, RRR, s1 s2 normal, 1/6 systolic murmur, No diastolic murmur, no rubs, gallops, thrills, or heaves Abdomen: soft,  nontender; no hepatosplenomehaly, BS+; abdominal aorta nontender and not dilated by palpation. Back: no CVA tenderness Pulses: 2+  Musculoskeletal: full range of motion, normal strength, no joint deformities Extremities: Pulses 2+, no clubbing cyanosis or edema, Homan's sign negative  Neurologic: grossly nonfocal; Cranial nerves grossly wnl Psychologic: Normal mood and affect  ECG (independently read by me): Normal sinus rhythm at 70 bpm.  Mild T wave changes anterolaterally.  Q wave in lead 3.  Normal intervals.  ECG (independently read by me): Sinus bradycardia 57 bpm.  PR interval 186 ms.  Poor R wave progression.  LABS:  BMET  BMP Latest Ref Rng 11/14/2013 11/13/2013 11/12/2013  Glucose 70 - 99 mg/dL 148(H) 235(H) 99  BUN 6 - 23 mg/dL 20  27(H) 31(H)  Creatinine 0.50 - 1.35 mg/dL 0.71 0.84 1.30  Sodium 137 - 147 mEq/L 131(L) 132(L) 133(L)  Potassium 3.7 - 5.3 mEq/L 5.1 4.4 4.3  Chloride 96 - 112 mEq/L 94(L) 94(L) 97  CO2 19 - 32 mEq/L 24 25 23   Calcium 8.4 - 10.5 mg/dL 8.3(L) 7.9(L) 7.8(L)     Hepatic Function Panel     Component Value Date/Time   PROT 6.3 11/08/2013 2135   ALBUMIN 3.1* 11/08/2013 2135   AST 42* 11/08/2013 2135   ALT 17 11/08/2013 2135   ALKPHOS 56 11/08/2013 2135   BILITOT 0.5 11/08/2013 2135     CBC  CBC Latest Ref Rng 03/02/2014 11/14/2013 11/12/2013  WBC 4.6 - 10.2 K/uL 11.5(A) 8.4 8.0  Hemoglobin 14.1 - 18.1 g/dL 13.5(A) 11.2(L) 10.6(L)  Hematocrit 43.5 - 53.7 % 44.4 34.2(L) 32.4(L)  Platelets 150 - 400 K/uL - 321 174     BNP No results found for: PROBNP  Lipid Panel     Component Value Date/Time   CHOL 148 03/02/2014 0910   CHOL 149 11/06/2013 0300   TRIG 187* 03/02/2014 0910   HDL 53 03/02/2014 0910   HDL 44 11/06/2013 0300   CHOLHDL 2.8 03/02/2014 0910   CHOLHDL 3.4 11/06/2013 0300   VLDL 39 11/06/2013 0300   LDLCALC 58 03/02/2014 0910   LDLCALC 66 11/06/2013 0300     RADIOLOGY: No results found.    ASSESSMENT AND PLAN: Mr. Jossue Rubenstein is a 49 year old gentleman with a cardiac risk factor profile notable for hypertension, diabetes mellitus, prior tobacco use, as well as previous use of cocaine. He was found to have severe three-vessel coronary obstructive disease at urgent cardiac catheterization 3 months ago and was treated with CABG surgery 6.  Presently, he is maintaining sinus rhythm without recurrent atrial fibrillation.  Since I last saw him, he has been weaned off amiodarone and this was discontinued in January.  He is diabetic.  Target blood pressure is less than 130/80.  I have recommended he discontinue the combination lisinopril HCT.  I am discontinuing the combination medicine and have her in for lisinopril 40 mg as well as HCTZ 25 mg.  I also have suggested  over-the-counter fish oil in light of his elevated triglycerides.  He is moderately obese with a body mass index of 38.8.  We discussed the importance of weight loss.  We discussed exercising 5 days per week minimum 30 minutes at a time.  He will reduce his aspirin from 325 mg to 81 mg.  His LDL cholesterol is very good on and he is tolerating atorvastatin 20 mg for his hyperlipidemia.  A BMET will be obtained in 2 weeks.  I will see him in 4 months for cardiology  reevaluation or sooner if problems arise.   Time spent: 25 minutes   Troy Sine, MD, [facility]  05/10/2014 7:16 PM

## 2014-05-11 ENCOUNTER — Encounter (HOSPITAL_COMMUNITY)
Admission: RE | Disposition: A | Discharge status: Home / Self Care | Payer: Self-pay | Source: Ambulatory Visit | Attending: Internal Medicine

## 2014-05-11 ENCOUNTER — Encounter (HOSPITAL_COMMUNITY): Payer: Self-pay | Admitting: *Deleted

## 2014-05-11 ENCOUNTER — Ambulatory Visit (HOSPITAL_COMMUNITY)
Admission: RE | Admit: 2014-05-11 | Discharge: 2014-05-11 | Disposition: A | Discharge status: Home / Self Care | Payer: Medicaid Other | Source: Ambulatory Visit | Attending: Internal Medicine | Admitting: Internal Medicine

## 2014-05-11 DIAGNOSIS — Z1211 Encounter for screening for malignant neoplasm of colon: Secondary | ICD-10-CM

## 2014-05-11 DIAGNOSIS — Z87891 Personal history of nicotine dependence: Secondary | ICD-10-CM | POA: Insufficient documentation

## 2014-05-11 DIAGNOSIS — E119 Type 2 diabetes mellitus without complications: Secondary | ICD-10-CM | POA: Insufficient documentation

## 2014-05-11 DIAGNOSIS — K649 Unspecified hemorrhoids: Secondary | ICD-10-CM | POA: Insufficient documentation

## 2014-05-11 DIAGNOSIS — I2510 Atherosclerotic heart disease of native coronary artery without angina pectoris: Secondary | ICD-10-CM | POA: Insufficient documentation

## 2014-05-11 DIAGNOSIS — K219 Gastro-esophageal reflux disease without esophagitis: Secondary | ICD-10-CM | POA: Insufficient documentation

## 2014-05-11 DIAGNOSIS — Z951 Presence of aortocoronary bypass graft: Secondary | ICD-10-CM | POA: Insufficient documentation

## 2014-05-11 DIAGNOSIS — D125 Benign neoplasm of sigmoid colon: Secondary | ICD-10-CM

## 2014-05-11 DIAGNOSIS — I10 Essential (primary) hypertension: Secondary | ICD-10-CM | POA: Insufficient documentation

## 2014-05-11 DIAGNOSIS — K648 Other hemorrhoids: Secondary | ICD-10-CM

## 2014-05-11 DIAGNOSIS — K5730 Diverticulosis of large intestine without perforation or abscess without bleeding: Secondary | ICD-10-CM

## 2014-05-11 DIAGNOSIS — G4700 Insomnia, unspecified: Secondary | ICD-10-CM | POA: Insufficient documentation

## 2014-05-11 DIAGNOSIS — I252 Old myocardial infarction: Secondary | ICD-10-CM | POA: Insufficient documentation

## 2014-05-11 DIAGNOSIS — F329 Major depressive disorder, single episode, unspecified: Secondary | ICD-10-CM | POA: Insufficient documentation

## 2014-05-11 HISTORY — PX: COLONOSCOPY: SHX5424

## 2014-05-11 LAB — GLUCOSE, CAPILLARY: GLUCOSE-CAPILLARY: 131 mg/dL — AB (ref 70–99)

## 2014-05-11 SURGERY — COLONOSCOPY
Anesthesia: Moderate Sedation

## 2014-05-11 MED ORDER — SODIUM CHLORIDE 0.9 % IV SOLN
INTRAVENOUS | Status: DC
  Administered 2014-05-11: 09:00:00 via INTRAVENOUS

## 2014-05-11 MED ORDER — STERILE WATER FOR IRRIGATION IR SOLN
Status: DC | PRN
  Administered 2014-05-11: 10:00:00

## 2014-05-11 MED ORDER — MEPERIDINE HCL 50 MG/ML IJ SOLN
INTRAMUSCULAR | Status: DC | PRN
  Administered 2014-05-11 (×2): 25 mg via INTRAVENOUS

## 2014-05-11 MED ORDER — MIDAZOLAM HCL 5 MG/5ML IJ SOLN
INTRAMUSCULAR | Status: AC
  Filled 2014-05-11: qty 10

## 2014-05-11 MED ORDER — MIDAZOLAM HCL 5 MG/5ML IJ SOLN
INTRAMUSCULAR | Status: DC | PRN
  Administered 2014-05-11: 2 mg via INTRAVENOUS
  Administered 2014-05-11: 3 mg via INTRAVENOUS
  Administered 2014-05-11: 2 mg via INTRAVENOUS
  Administered 2014-05-11: 1 mg via INTRAVENOUS

## 2014-05-11 MED ORDER — MEPERIDINE HCL 50 MG/ML IJ SOLN
INTRAMUSCULAR | Status: AC
  Filled 2014-05-11: qty 1

## 2014-05-11 NOTE — H&P (Signed)
Shane Frye is an 49 y.o. male.   Chief Complaint: Patient is here for colonoscopy. HPI: Patient is 49 year old Caucasian male who is in for screening colonoscopy. He denies abdominal pain change in bowel habits or rectal bleeding. Family history is negative for CRC. Patient had coronary artery bypass graft September 2015 and is doing well.  Past Medical History  Diagnosis Date  . Depression   . Insomnia   . Neuropathy   . Hypertension   . GERD (gastroesophageal reflux disease)   . Diabetes mellitus   . Myocardial infarction 11/04/13    Past Surgical History  Procedure Laterality Date  . Cardioversion N/A 11/06/2013    Procedure: CARDIOVERSION;  Surgeon: Thayer Headings, MD;  Location: [place];  Service: Cardiovascular;  Laterality: N/A;  . Coronary artery bypass graft N/A 11/09/2013    Procedure: CORONARY ARTERY BYPASS GRAFTING (CABG);  Surgeon: Melrose Nakayama, MD;  Location: [place];  Service: Open Heart Surgery;  Laterality: N/A;  . Intraoperative transesophageal echocardiogram N/A 11/09/2013    Procedure: INTRAOPERATIVE TRANSESOPHAGEAL ECHOCARDIOGRAM;  Surgeon: Melrose Nakayama, MD;  Location: [place];  Service: Open Heart Surgery;  Laterality: N/A;  . Left heart catheterization with coronary angiogram N/A 11/04/2013    Procedure: LEFT HEART CATHETERIZATION WITH CORONARY ANGIOGRAM;  Surgeon: Troy Sine, MD;  Location: [facility] CATH LAB;  Service: Cardiovascular;  Laterality: N/A;  . No past surgeries      Family History  Problem Relation Age of Onset  . Hypertension Paternal Grandfather   . Hypertension Brother   . Diabetes Brother    Social History:  reports that he quit smoking about 5 months ago. His smoking use included Cigarettes. He smoked 0.50 packs per day. He quit smokeless tobacco use about 6 months ago. He reports that he does not drink alcohol or use illicit drugs.  Allergies: No Known Allergies  Medications Prior to Admission  Medication Sig Dispense Refill   . atorvastatin (LIPITOR) 20 MG tablet Take 1 tablet (20 mg total) by mouth daily. 90 tablet 3  . carvedilol (COREG) 12.5 MG tablet Take 1 tablet (12.5 mg total) by mouth 2 (two) times daily. 180 tablet 0  . citalopram (CELEXA) 20 MG tablet Take 20 mg by mouth daily.    Marland Kitchen gabapentin (NEURONTIN) 300 MG capsule Take 300 mg by mouth at bedtime.    . hydrochlorothiazide (HYDRODIURIL) 25 MG tablet Take 1 tablet (25 mg total) by mouth daily. 30 tablet 11  . insulin aspart protamine- aspart (NOVOLOG MIX 70/30) (70-30) 100 UNIT/ML injection Inject 0.15 mLs (15 Units total) into the skin 2 (two) times daily with a meal. (Patient taking differently: Inject 15 Units into the skin 3 (three) times daily. ) 10 mL 11  . lisinopril (PRINIVIL,ZESTRIL) 40 MG tablet Take 1 tablet (40 mg total) by mouth daily. 30 tablet 11  . metFORMIN (GLUCOPHAGE) 1000 MG tablet Take 1,000 mg by mouth 2 (two) times daily with a meal.    . pantoprazole (PROTONIX) 40 MG tablet Take 40 mg by mouth daily.      Results for orders placed or performed during the hospital encounter of 05/11/14 (from the past 48 hour(s))  Glucose, capillary     Status: Abnormal   Collection Time: 05/11/14  8:11 AM  Result Value Ref Range   Glucose-Capillary 131 (H) 70 - 99 mg/dL   No results found.  ROS  Blood pressure 132/84, temperature 97.6 F (36.4 C), temperature source Oral, resp. rate 78, height  5\' 9"  (1.753 m), weight 262 lb (118.842 kg), SpO2 97 %. Physical Exam  Constitutional: He appears well-developed and well-nourished.  HENT:  Mouth/Throat: Oropharynx is clear and moist.  Face is flushed.  Eyes: Conjunctivae are normal. No scleral icterus.  Neck: No thyromegaly present.  Cardiovascular: Normal rate, regular rhythm and normal heart sounds.   No murmur heard. Respiratory: Effort normal.  GI: Soft. He exhibits no distension and no mass. There is no tenderness.  Musculoskeletal: He exhibits no edema.  Lymphadenopathy:    He has no  cervical adenopathy.  Neurological: He is alert.  Skin: Skin is warm and dry.     Assessment/Plan Average risk screening colonoscopy.  Glennice Marcos U 05/11/2014, 9:42 AM

## 2014-05-11 NOTE — Discharge Instructions (Signed)
No aspirin or NSAIDs for 1 week . Resume usual medications and high fiber diet. No driving for 24 hours. Physician will call with biopsy results.   Colonoscopy, Care After These instructions give you information on caring for yourself after your procedure. Your doctor may also give you more specific instructions. Call your doctor if you have any problems or questions after your procedure. HOME CARE  Do not drive for 24 hours.  Do not sign important papers or use machinery for 24 hours.  You may shower.  You may go back to your usual activities, but go slower for the first 24 hours.  Take rest breaks often during the first 24 hours.  Walk around or use warm packs on your belly (abdomen) if you have belly cramping or gas.  Drink enough fluids to keep your pee (urine) clear or pale yellow.  Resume your normal diet. Avoid heavy or fried foods.  Avoid drinking alcohol for 24 hours or as told by your doctor.  Only take medicines as told by your doctor. If a tissue sample (biopsy) was taken during the procedure:   Do not take aspirin or blood thinners for 7 days, or as told by your doctor.  Do not drink alcohol for 7 days, or as told by your doctor.  Eat soft foods for the first 24 hours. GET HELP IF: You still have a small amount of blood in your poop (stool) 2-3 days after the procedure. GET HELP RIGHT AWAY IF:  You have more than a small amount of blood in your poop.  You see clumps of tissue (blood clots) in your poop.  Your belly is puffy (swollen).  You feel sick to your stomach (nauseous) or throw up (vomit).  You have a fever.  You have belly pain that gets worse and medicine does not help. MAKE SURE YOU:  Understand these instructions.  Will watch your condition.  Will get help right away if you are not doing well or get worse. Document Released: 03/08/2010 Document Revised: 02/08/2013 Document Reviewed: 10/11/2012 [facility] Patient Information 2015  [place], [place]. This information is not intended to replace advice given to you by your health care provider. Make sure you discuss any questions you have with your health care provider.   High-Fiber Diet Fiber is found in fruits, vegetables, and grains. A high-fiber diet encourages the addition of more whole grains, legumes, fruits, and vegetables in your diet. The recommended amount of fiber for adult males is 38 g per day. For adult females, it is 25 g per day. Pregnant and lactating women should get 28 g of fiber per day. If you have a digestive or bowel problem, ask your caregiver for advice before adding high-fiber foods to your diet. Eat a variety of high-fiber foods instead of only a select few type of foods.  PURPOSE To increase stool bulk. To make bowel movements more regular to prevent constipation. To lower cholesterol. To prevent overeating. WHEN IS THIS DIET USED? It may be used if you have constipation and hemorrhoids. It may be used if you have uncomplicated diverticulosis (intestine condition) and irritable bowel syndrome. It may be used if you need help with weight management. It may be used if you want to add it to your diet as a protective measure against atherosclerosis, diabetes, and cancer. SOURCES OF FIBER Whole-grain breads and cereals. Fruits, such as apples, oranges, bananas, berries, prunes, and pears. Vegetables, such as green peas, carrots, sweet potatoes, beets, broccoli, cabbage, spinach, and  artichokes. Legumes, such split peas, soy, lentils. Almonds. FIBER CONTENT IN FOODS Starches and Grains / Dietary Fiber (g) Cheerios, 1 cup / 3 g Corn Flakes cereal, 1 cup / 0.7 g Rice crispy treat cereal, 1 cup / 0.3 g Instant oatmeal (cooked),  cup / 2 g Frosted wheat cereal, 1 cup / 5.1 g Brown, long-grain rice (cooked), 1 cup / 3.5 g White, long-grain rice (cooked), 1 cup / 0.6 g Enriched macaroni (cooked), 1 cup / 2.5 g Legumes / Dietary Fiber (g) Baked beans  (canned, plain, or vegetarian),  cup / 5.2 g Kidney beans (canned),  cup / 6.8 g Pinto beans (cooked),  cup / 5.5 g Breads and Crackers / Dietary Fiber (g) Plain or honey graham crackers, 2 squares / 0.7 g Saltine crackers, 3 squares / 0.3 g Plain, salted pretzels, 10 pieces / 1.8 g Whole-wheat bread, 1 slice / 1.9 g White bread, 1 slice / 0.7 g Raisin bread, 1 slice / 1.2 g Plain bagel, 3 oz / 2 g Flour tortilla, 1 oz / 0.9 g Corn tortilla, 1 small / 1.5 g Hamburger or hotdog bun, 1 small / 0.9 g Fruits / Dietary Fiber (g) Apple with skin, 1 medium / 4.4 g Sweetened applesauce,  cup / 1.5 g Banana,  medium / 1.5 g Grapes, 10 grapes / 0.4 g Orange, 1 small / 2.3 g Raisin, 1.5 oz / 1.6 g Melon, 1 cup / 1.4 g Vegetables / Dietary Fiber (g) Green beans (canned),  cup / 1.3 g Carrots (cooked),  cup / 2.3 g Broccoli (cooked),  cup / 2.8 g Peas (cooked),  cup / 4.4 g Mashed potatoes,  cup / 1.6 g Lettuce, 1 cup / 0.5 g Corn (canned),  cup / 1.6 g Tomato,  cup / 1.1 g Document Released: 02/03/2005 Document Revised: 08/05/2011 Document Reviewed: 05/08/2011 [employer] Patient Information 2015 [place], [place]. This information is not intended to replace advice given to you by your health care provider. Make sure you discuss any questions you have with your health care provider. Colon Polyps Polyps are lumps of extra tissue growing inside the body. Polyps can grow in the large intestine (colon). Most colon polyps are noncancerous (benign). However, some colon polyps can become cancerous over time. Polyps that are larger than a pea may be harmful. To be safe, caregivers remove and test all polyps. CAUSES  Polyps form when mutations in the genes cause your cells to grow and divide even though no more tissue is needed. RISK FACTORS There are a number of risk factors that can increase your chances of getting colon polyps. They include:  Being older than 50 years.  Family history  of colon polyps or colon cancer.  Long-term colon diseases, such as colitis or Crohn disease.  Being overweight.  Smoking.  Being inactive.  Drinking too much alcohol. SYMPTOMS  Most small polyps do not cause symptoms. If symptoms are present, they may include:  Blood in the stool. The stool may look dark red or black.  Constipation or diarrhea that lasts longer than 1 week. DIAGNOSIS People often do not know they have polyps until their caregiver finds them during a regular checkup. Your caregiver can use 4 tests to check for polyps:  Digital rectal exam. The caregiver wears gloves and feels inside the rectum. This test would find polyps only in the rectum.  Barium enema. The caregiver puts a liquid called barium into your rectum before taking X-rays of your colon. Barium makes  your colon look white. Polyps are dark, so they are easy to see in the X-ray pictures.  Sigmoidoscopy. A thin, flexible tube (sigmoidoscope) is placed into your rectum. The sigmoidoscope has a light and tiny camera in it. The caregiver uses the sigmoidoscope to look at the last third of your colon.  Colonoscopy. This test is like sigmoidoscopy, but the caregiver looks at the entire colon. This is the most common method for finding and removing polyps. TREATMENT  Any polyps will be removed during a sigmoidoscopy or colonoscopy. The polyps are then tested for cancer. PREVENTION  To help lower your risk of getting more colon polyps:  Eat plenty of fruits and vegetables. Avoid eating fatty foods.  Do not smoke.  Avoid drinking alcohol.  Exercise every day.  Lose weight if recommended by your caregiver.  Eat plenty of calcium and folate. Foods that are rich in calcium include milk, cheese, and broccoli. Foods that are rich in folate include chickpeas, kidney beans, and spinach. HOME CARE INSTRUCTIONS Keep all follow-up appointments as directed by your caregiver. You may need periodic exams to check for  polyps. SEEK MEDICAL CARE IF: You notice bleeding during a bowel movement. Document Released: 10/31/2003 Document Revised: 04/28/2011 Document Reviewed: 04/15/2011 [facility] Patient Information 2015 [place], [place]. This information is not intended to replace advice given to you by your health care provider. Make sure you discuss any questions you have with your health care provider.

## 2014-05-11 NOTE — Op Note (Signed)
COLONOSCOPY PROCEDURE REPORT  PATIENT:  Shane Frye  MR#:  [id number] Birthdate:  1966-01-13, 49 y.o., male Endoscopist:  Dr. Rogene Houston, MD Referred By:  Dr. Lillette Boxer. Sabra Heck, MD  Procedure Date: 05/11/2014  Procedure:   Colonoscopy  Indications:  Patient is 49 year old Caucasian male was undergoing average risk screening colonoscopy.  Informed Consent:  The procedure and risks were reviewed with the patient and informed consent was obtained.  Medications:  Demerol 50 mg IV Versed 8 mg IV  Description of procedure:  After a digital rectal exam was performed, that colonoscope was advanced from the anus through the rectum and colon to the area of the cecum, ileocecal valve and appendiceal orifice. The cecum was deeply intubated. These structures were well-seen and photographed for the record. From the level of the cecum and ileocecal valve, the scope was slowly and cautiously withdrawn. The mucosal surfaces were carefully surveyed utilizing scope tip to flexion to facilitate fold flattening as needed. The scope was pulled down into the rectum where a thorough exam including retroflexion was performed.  Findings:  Prep satisfactory. He had lot of liquid stool which had to be suctioned out. Few diverticula at sigmoid colon. Two polyps located at mid sigmoid colon close to each other. Larger polyp was about 6 mm and the other one was smaller. Both of these polyps were hot snared. One polyp was lost. Normal rectal mucosa. Small hemorrhoids below the dentate line   Therapeutic/Diagnostic Maneuvers Performed:  See above  Complications:  None  Cecal Withdrawal Time:  20  minutes  Impression:  Examination performed to cecum. Mild sigmoid colon diverticulosis. Two small polyps hot snared from mid sigmoid colon and one was lost. Small external hemorrhoids  Recommendations:  Standard instructions given. No aspirin or NSAIDs for 1 week. I will contact patient with biopsy results  and further recommendations.  Charlese Gruetzmacher U  05/11/2014 10:35 AM  CC: Dr. Wardell Honour, MD & Dr. Rayne Du ref. provider found

## 2014-05-15 ENCOUNTER — Encounter (HOSPITAL_COMMUNITY): Payer: Self-pay | Admitting: Internal Medicine

## 2014-05-15 ENCOUNTER — Ambulatory Visit: Payer: Medicaid Other

## 2014-05-16 ENCOUNTER — Telehealth: Payer: Self-pay | Admitting: Family Medicine

## 2014-05-16 ENCOUNTER — Other Ambulatory Visit: Payer: Self-pay | Admitting: *Deleted

## 2014-05-16 MED ORDER — METFORMIN HCL 1000 MG PO TABS: 1000.0000 mg | ORAL_TABLET | Freq: Two times a day (BID) | ORAL | Status: DC

## 2014-05-16 NOTE — Telephone Encounter (Signed)
Patient notified that rx sent to pharmacy 

## 2014-05-23 ENCOUNTER — Other Ambulatory Visit (HOSPITAL_COMMUNITY)
Admission: RE | Admit: 2014-05-23 | Discharge: 2014-05-23 | Disposition: A | Discharge status: Home / Self Care | Payer: Medicaid Other | Source: Ambulatory Visit | Attending: Cardiovascular Disease | Admitting: Cardiovascular Disease

## 2014-05-23 DIAGNOSIS — Z79899 Other long term (current) drug therapy: Secondary | ICD-10-CM | POA: Diagnosis not present

## 2014-05-23 DIAGNOSIS — I2510 Atherosclerotic heart disease of native coronary artery without angina pectoris: Secondary | ICD-10-CM | POA: Diagnosis present

## 2014-05-23 LAB — BASIC METABOLIC PANEL
Anion gap: 10 (ref 5–15)
BUN: 25 mg/dL — AB (ref 6–23)
CO2: 25 mmol/L (ref 19–32)
CREATININE: 0.98 mg/dL (ref 0.50–1.35)
Calcium: 8.9 mg/dL (ref 8.4–10.5)
Chloride: 100 mmol/L (ref 96–112)
GFR calc Af Amer: >90 mL/min (ref >90)
Glucose, Bld: 268 mg/dL — ABNORMAL HIGH (ref 70–99)
Potassium: 4.8 mmol/L (ref 3.5–5.1)
Sodium: 135 mmol/L (ref 135–145)

## 2014-05-26 ENCOUNTER — Ambulatory Visit: Payer: Self-pay | Admitting: Family Medicine

## 2014-06-05 ENCOUNTER — Ambulatory Visit: Payer: Self-pay

## 2014-06-08 ENCOUNTER — Emergency Department (HOSPITAL_COMMUNITY): Payer: Medicaid Other

## 2014-06-08 ENCOUNTER — Emergency Department (HOSPITAL_COMMUNITY)
Admission: EM | Admit: 2014-06-08 | Discharge: 2014-06-08 | Disposition: A | Discharge status: Home / Self Care | Payer: Medicaid Other | Attending: Emergency Medicine | Admitting: Emergency Medicine

## 2014-06-08 ENCOUNTER — Encounter (HOSPITAL_COMMUNITY): Payer: Self-pay | Admitting: Cardiology

## 2014-06-08 DIAGNOSIS — I2510 Atherosclerotic heart disease of native coronary artery without angina pectoris: Secondary | ICD-10-CM | POA: Diagnosis not present

## 2014-06-08 DIAGNOSIS — W1839XA Other fall on same level, initial encounter: Secondary | ICD-10-CM | POA: Insufficient documentation

## 2014-06-08 DIAGNOSIS — I10 Essential (primary) hypertension: Secondary | ICD-10-CM | POA: Diagnosis not present

## 2014-06-08 DIAGNOSIS — Z794 Long term (current) use of insulin: Secondary | ICD-10-CM | POA: Diagnosis not present

## 2014-06-08 DIAGNOSIS — S299XXA Unspecified injury of thorax, initial encounter: Secondary | ICD-10-CM | POA: Insufficient documentation

## 2014-06-08 DIAGNOSIS — R55 Syncope and collapse: Secondary | ICD-10-CM

## 2014-06-08 DIAGNOSIS — K219 Gastro-esophageal reflux disease without esophagitis: Secondary | ICD-10-CM | POA: Diagnosis not present

## 2014-06-08 DIAGNOSIS — E119 Type 2 diabetes mellitus without complications: Secondary | ICD-10-CM | POA: Diagnosis not present

## 2014-06-08 DIAGNOSIS — Y9389 Activity, other specified: Secondary | ICD-10-CM | POA: Diagnosis not present

## 2014-06-08 DIAGNOSIS — F329 Major depressive disorder, single episode, unspecified: Secondary | ICD-10-CM | POA: Insufficient documentation

## 2014-06-08 DIAGNOSIS — Y9289 Other specified places as the place of occurrence of the external cause: Secondary | ICD-10-CM | POA: Insufficient documentation

## 2014-06-08 DIAGNOSIS — Z8669 Personal history of other diseases of the nervous system and sense organs: Secondary | ICD-10-CM | POA: Insufficient documentation

## 2014-06-08 DIAGNOSIS — Z79899 Other long term (current) drug therapy: Secondary | ICD-10-CM | POA: Insufficient documentation

## 2014-06-08 DIAGNOSIS — Y998 Other external cause status: Secondary | ICD-10-CM | POA: Insufficient documentation

## 2014-06-08 DIAGNOSIS — R0789 Other chest pain: Secondary | ICD-10-CM

## 2014-06-08 LAB — CBC WITH DIFFERENTIAL/PLATELET
Basophils Absolute: 0 10*3/uL (ref 0.0–0.1)
Basophils Relative: 0 % (ref 0–1)
EOS ABS: 0.5 10*3/uL (ref 0.0–0.7)
Eosinophils Relative: 5 % (ref 0–5)
HCT: 42.2 % (ref 39.0–52.0)
Hemoglobin: 13.5 g/dL (ref 13.0–17.0)
LYMPHS ABS: 1.9 10*3/uL (ref 0.7–4.0)
Lymphocytes Relative: 19 % (ref 12–46)
MCH: 26.4 pg (ref 26.0–34.0)
MCHC: 32 g/dL (ref 30.0–36.0)
MCV: 82.4 fL (ref 78.0–100.0)
MONOS PCT: 9 % (ref 3–12)
Monocytes Absolute: 0.9 10*3/uL (ref 0.1–1.0)
Neutro Abs: 6.6 10*3/uL (ref 1.7–7.7)
Neutrophils Relative %: 67 % (ref 43–77)
Platelets: 243 10*3/uL (ref 150–400)
RBC: 5.12 MIL/uL (ref 4.22–5.81)
RDW: 13.8 % (ref 11.5–15.5)
WBC: 10 10*3/uL (ref 4.0–10.5)

## 2014-06-08 LAB — BASIC METABOLIC PANEL
Anion gap: 7 (ref 5–15)
BUN: 10 mg/dL (ref 6–23)
CO2: 28 mmol/L (ref 19–32)
Calcium: 8.4 mg/dL (ref 8.4–10.5)
Chloride: 99 mmol/L (ref 96–112)
Creatinine, Ser: 0.7 mg/dL (ref 0.50–1.35)
Glucose, Bld: 180 mg/dL — ABNORMAL HIGH (ref 70–99)
POTASSIUM: 4.4 mmol/L (ref 3.5–5.1)
SODIUM: 134 mmol/L — AB (ref 135–145)

## 2014-06-08 LAB — TROPONIN I: Troponin I: <0.03 ng/mL (ref <0.031)

## 2014-06-08 MED ORDER — OXYCODONE-ACETAMINOPHEN 5-325 MG PO TABS: 1.0000 | ORAL_TABLET | ORAL | Status: DC | PRN

## 2014-06-08 MED ORDER — HYDROCODONE-ACETAMINOPHEN 5-325 MG PO TABS
1.0000 | ORAL_TABLET | Freq: Once | ORAL | Status: AC
  Administered 2014-06-08: 1 via ORAL
  Filled 2014-06-08: qty 1

## 2014-06-08 NOTE — ED Provider Notes (Signed)
CSN: [id number]     Arrival date & time 06/08/14  6789 History   First MD Initiated Contact with Patient 06/08/14 [phone]     Chief Complaint  Patient presents with  . Near Syncope  . Rib Injury     (Consider location/radiation/quality/duration/timing/severity/associated sxs/prior Treatment) The history is provided by the patient.   Shane Frye is a 49 y.o. male presenting with left sided chest wall pain since yesterday when be became lightheaded and fell, landing on his left side.  He reports he had bee sitting approximately 45 minutes when he stood quickly, became lightheaded, causing the fall which he states happens occasionally for him, stating "I think it's my blood pressure".  He denies headache or head injury with this fall.  Pain is worsened with deep inspiration and palpation and certain positions.  He denied having chest pain or increased shortness of breath at the time of this near syncopal event.  He does endorse mild chronic shortness of breath since his CABG last fall which is not worsened today.  He is on no levator for his pain.     Past Medical History  Diagnosis Date  . Depression   . Insomnia   . Neuropathy   . Hypertension   . GERD (gastroesophageal reflux disease)   . Diabetes mellitus   . Myocardial infarction 11/04/13   Past Surgical History  Procedure Laterality Date  . Cardioversion N/A 11/06/2013    Procedure: CARDIOVERSION;  Surgeon: Thayer Headings, MD;  Location: [place];  Service: Cardiovascular;  Laterality: N/A;  . Coronary artery bypass graft N/A 11/09/2013    Procedure: CORONARY ARTERY BYPASS GRAFTING (CABG);  Surgeon: Melrose Nakayama, MD;  Location: [place];  Service: Open Heart Surgery;  Laterality: N/A;  . Intraoperative transesophageal echocardiogram N/A 11/09/2013    Procedure: INTRAOPERATIVE TRANSESOPHAGEAL ECHOCARDIOGRAM;  Surgeon: Melrose Nakayama, MD;  Location: [facility];  Service: Open Heart Surgery;  Laterality: N/A;  . Left heart  catheterization with coronary angiogram N/A 11/04/2013    Procedure: LEFT HEART CATHETERIZATION WITH CORONARY ANGIOGRAM;  Surgeon: Troy Sine, MD;  Location: [facility];  Service: Cardiovascular;  Laterality: N/A;  . No past surgeries    . Colonoscopy N/A 05/11/2014    Procedure: COLONOSCOPY;  Surgeon: Rogene Houston, MD;  Location: [facility];  Service: Endoscopy;  Laterality: N/A;  930   Family History  Problem Relation Age of Onset  . Hypertension Paternal Grandfather   . Hypertension Brother   . Diabetes Brother    History  Substance Use Topics  . Smoking status: Former Smoker -- 0.50 packs/day    Types: Cigarettes    Quit date: 11/10/2013  . Smokeless tobacco: Former Systems developer    Quit date: 11/04/2013  . Alcohol Use: No    Review of Systems  Constitutional: Negative for fever, chills and diaphoresis.  HENT: Negative for congestion and sore throat.   Eyes: Negative.   Respiratory: Positive for shortness of breath. Negative for chest tightness.   Cardiovascular: Positive for chest pain.  Gastrointestinal: Negative for nausea, vomiting and abdominal pain.  Genitourinary: Negative.   Musculoskeletal: Negative for joint swelling, arthralgias and neck pain.  Skin: Negative.  Negative for rash and wound.  Neurological: Negative for dizziness, weakness, light-headedness, numbness and headaches.  Psychiatric/Behavioral: Negative.       Allergies  Review of patient's allergies indicates no known allergies.  Home Medications   Prior to Admission medications   Medication Sig Start Date End  Date Taking? Authorizing Provider  atorvastatin (LIPITOR) 20 MG tablet Take 1 tablet (20 mg total) by mouth daily. 01/26/14  Yes Troy Sine, MD  carvedilol (COREG) 12.5 MG tablet Take 1 tablet (12.5 mg total) by mouth 2 (two) times daily. 04/20/14  Yes Wardell Honour, MD  citalopram (CELEXA) 20 MG tablet Take 20 mg by mouth daily.   Yes Historical Provider, MD  gabapentin (NEURONTIN)  300 MG capsule Take 300 mg by mouth at bedtime.   Yes Historical Provider, MD  hydrochlorothiazide (HYDRODIURIL) 25 MG tablet Take 1 tablet (25 mg total) by mouth daily. 05/08/14  Yes Troy Sine, MD  insulin aspart protamine- aspart (NOVOLOG MIX 70/30) (70-30) 100 UNIT/ML injection Inject 0.15 mLs (15 Units total) into the skin 2 (two) times daily with a meal. 11/15/13  Yes Gina L Collins, PA-C  lisinopril (PRINIVIL,ZESTRIL) 40 MG tablet Take 1 tablet (40 mg total) by mouth daily. 05/08/14  Yes Troy Sine, MD  metFORMIN (GLUCOPHAGE) 1000 MG tablet Take 1 tablet (1,000 mg total) by mouth 2 (two) times daily with a meal. 05/16/14  Yes Wardell Honour, MD  pantoprazole (PROTONIX) 40 MG tablet Take 40 mg by mouth daily.   Yes Historical Provider, MD  oxyCODONE-acetaminophen (PERCOCET/ROXICET) 5-325 MG per tablet Take 1 tablet by mouth every 4 (four) hours as needed. 06/08/14   Evalee Jefferson, PA-C   BP 164/92 mmHg  Pulse 67  Temp(Src) 97.6 F (36.4 C) (Oral)  Resp 17  Ht 5\' 9"  (1.753 m)  Wt 263 lb (119.296 kg)  BMI 38.82 kg/m2  SpO2 97% Physical Exam  Constitutional: He appears well-developed and well-nourished.  HENT:  Head: Normocephalic and atraumatic.  Eyes: Conjunctivae are normal.  Neck: Normal range of motion.  Cardiovascular: Normal rate, regular rhythm, normal heart sounds and intact distal pulses.   Pulmonary/Chest: Effort normal and breath sounds normal. No respiratory distress. He has no wheezes. He has no rhonchi. He exhibits tenderness.  Tender to palpation left mid chest wall at mid clavicular line.  There is no hematoma or bruising, no palpable deformity.  Abdominal: Soft. Bowel sounds are normal. There is no tenderness. There is no guarding.  Musculoskeletal: Normal range of motion. He exhibits no edema.  Neurological: He is alert.  Skin: Skin is warm and dry.  Psychiatric: He has a normal mood and affect.  Nursing note and vitals reviewed.   ED Course  Procedures  (including critical care time) Labs Review Labs Reviewed  BASIC METABOLIC PANEL - Abnormal; Notable for the following:    Sodium 134 (*)    Glucose, Bld 180 (*)    All other components within normal limits  CBC WITH DIFFERENTIAL/PLATELET  TROPONIN I    Imaging Review Dg Ribs Unilateral W/chest Left  06/08/2014   CLINICAL DATA:  Status post recent fall for landing face [facility], left anterior rib pain in the area marble  EXAM: LEFT RIBS AND CHEST - 3+ VIEW  COMPARISON:  PA and lateral chest x-ray of April 20, 2014  FINDINGS: The lungs are well-expanded and clear. The heart and pulmonary vascularity are normal. The patient has undergone previous CABG. There are 6 intact sternal wires. There is no pleural effusion or pneumothorax. There are old fractures of the posterior aspects of the left fourth and fifth ribs. Deep to the area marked over the left anterior lower hemi thorax no acute rib fracture is demonstrated.  IMPRESSION: 1. No acute rib fracture is demonstrated. There are old fractures  of the posterior aspects of fourth and fifth left ribs 2. There are post CABG changes. There is no active cardiopulmonary disease.   Electronically Signed   By: Joss  Martinique M.D.   On: 06/08/2014 09:12     EKG Interpretation   Date/Time:  Thursday June 08 2014 08:25:11 EDT Ventricular Rate:  64 PR Interval:  175 QRS Duration: 84 QT Interval:  547 QTC Calculation: 564 R Axis:   55 Text Interpretation:  Sinus rhythm Borderline T abnormalities, lateral  leads Prolonged QT interval Baseline wander in lead(s) V2 Confirmed by  ZACKOWSKI  MD, SCOTT ([id number]) on 06/08/2014 8:55:44 AM      MDM   Final diagnoses:  Acute chest wall pain  Near syncope    Patients labs and/or radiological studies were reviewed and considered during the medical decision making and disposition process.  Results were also discussed with patient. Pt discussed with Dr Rogene Houston prior to dc home who also saw patient.  No  orthostasis in dept.  No evidence for cardiac source of sx, pt advised to f/u with pcp and cardiology for a recheck asap.  Encouraged to use caution when standing, slow positional changes to avoid falls/injury.  He was given oxycodone, advised heat to chest wall.  Cautioned re sedation.  Prn f/u here anticipated.    Evalee Jefferson, PA-C 06/08/14 [id number]  Fredia Sorrow, MD 06/08/14 1011

## 2014-06-08 NOTE — ED Notes (Signed)
States he got light headed yesterday and fell.  Fell on left side.  C/o left rib pain.

## 2014-06-08 NOTE — ED Notes (Signed)
Patient with no complaints at this time. Respirations even and unlabored. Skin warm/dry. Discharge instructions reviewed with patient at this time. Patient given opportunity to voice concerns/ask questions. Patient discharged at this time and left Emergency Department via wheelchair. 

## 2014-06-08 NOTE — ED Notes (Signed)
PA at bedside.

## 2014-06-08 NOTE — Discharge Instructions (Signed)
Chest Wall Pain Chest wall pain is pain in or around the bones and muscles of your chest. It may take up to 6 weeks to get better. It may take longer if you must stay physically active in your work and activities.  CAUSES  Chest wall pain may happen on its own. However, it may be caused by:  A viral illness like the flu.  Injury.  Coughing.  Exercise.  Arthritis.  Fibromyalgia.  Shingles. HOME CARE INSTRUCTIONS   Avoid overtiring physical activity. Try not to strain or perform activities that cause pain. This includes any activities using your chest or your abdominal and side muscles, especially if heavy weights are used.  Put ice on the sore area.  Put ice in a plastic bag.  Place a towel between your skin and the bag.  Leave the ice on for 15-20 minutes per hour while awake for the first 2 days.  Only take over-the-counter or prescription medicines for pain, discomfort, or fever as directed by your caregiver. SEEK IMMEDIATE MEDICAL CARE IF:   Your pain increases, or you are very uncomfortable.  You have a fever.  Your chest pain becomes worse.  You have new, unexplained symptoms.  You have nausea or vomiting.  You feel sweaty or lightheaded.  You have a cough with phlegm (sputum), or you cough up blood. MAKE SURE YOU:   Understand these instructions.  Will watch your condition.  Will get help right away if you are not doing well or get worse. Document Released: 02/03/2005 Document Revised: 04/28/2011 Document Reviewed: 09/30/2010 [facility] Patient Information 2015 [place], [place]. This information is not intended to replace advice given to you by your health care provider. Make sure you discuss any questions you have with your health care provider.  Near-Syncope Near-syncope (commonly known as near fainting) is sudden weakness, dizziness, or feeling like you might pass out. This can happen when getting up or while standing for a long time. It is caused by a  sudden decrease in blood flow to the brain, which can occur for various reasons. Most of the reasons are not serious.  HOME CARE Watch your condition for any changes.  Have someone stay with you until you feel stable.  If you feel like you are going to pass out:  Lie down right away.  Prop your feet up if you can.  Breathe deeply and steadily.  Move only when the feeling has gone away. Most of the time, this feeling lasts only a few minutes. You may feel tired for several hours.  Drink enough fluids to keep your pee (urine) clear or pale yellow.  If you are taking blood pressure or heart medicine, stand up slowly.  Follow up with your doctor as told. GET HELP RIGHT AWAY IF:   You have a severe headache.  You have unusual pain in the chest, belly (abdomen), or back.  You have bleeding from the mouth or butt (rectum), or you have black or tarry poop (stool).  You feel your heart beat differently than normal, or you have a very fast pulse.  You pass out, or you twitch and shake when you pass out.  You pass out when sitting or lying down.  You feel confused.  You have trouble walking.  You are weak.  You have vision problems. MAKE SURE YOU:   Understand these instructions.  Will watch your condition.  Will get help right away if you are not doing well or get worse. Document Released:  07/23/2007 Document Revised: 02/08/2013 Document Reviewed: 07/09/2012 [facility] Patient Information 2015 [place], [place]. This information is not intended to replace advice given to you by your health care provider. Make sure you discuss any questions you have with your health care provider.

## 2014-08-02 ENCOUNTER — Telehealth: Payer: Self-pay | Admitting: Family Medicine

## 2014-08-07 ENCOUNTER — Encounter: Payer: Self-pay | Admitting: Family Medicine

## 2014-08-07 ENCOUNTER — Ambulatory Visit (INDEPENDENT_AMBULATORY_CARE_PROVIDER_SITE_OTHER): Payer: Medicaid Other | Admitting: Family Medicine

## 2014-08-07 VITALS — BP 144/88 | HR 80 | Temp 96.5°F | Ht 69.0 in | Wt 265.0 lb

## 2014-08-07 DIAGNOSIS — E118 Type 2 diabetes mellitus with unspecified complications: Secondary | ICD-10-CM | POA: Diagnosis not present

## 2014-08-07 DIAGNOSIS — E785 Hyperlipidemia, unspecified: Secondary | ICD-10-CM | POA: Diagnosis not present

## 2014-08-07 DIAGNOSIS — I10 Essential (primary) hypertension: Secondary | ICD-10-CM | POA: Diagnosis not present

## 2014-08-07 DIAGNOSIS — R202 Paresthesia of skin: Secondary | ICD-10-CM | POA: Diagnosis not present

## 2014-08-07 LAB — POCT GLYCOSYLATED HEMOGLOBIN (HGB A1C): HEMOGLOBIN A1C: 8.8

## 2014-08-07 MED ORDER — GABAPENTIN 300 MG PO CAPS: 300.0000 mg | ORAL_CAPSULE | Freq: Two times a day (BID) | ORAL | Status: DC

## 2014-08-07 MED ORDER — GLUCOSE BLOOD VI STRP: ORAL_STRIP | Status: DC

## 2014-08-07 NOTE — Progress Notes (Signed)
Subjective:    Patient ID: Shane Frye, male    DOB: 06-Jul-1965, 49 y.o.   MRN: [id number]  HPI 49 year old male with diabetes, hypertension, coronary artery disease. Since his last visit here he saw cardiologist without significant change to his medical regimen. He has gained weight despite what he thinks is appropriate diet.  Biggest complaint today is numbness in his hands such that he drops things. I had thought at his last visit he may have carpal tunnel syndrome. He also tells me the gabapentin he takes is 51 years old and it may not be effective.    Review of Systems  Constitutional: Negative.   Respiratory: Negative.   Cardiovascular: Positive for leg swelling. Negative for chest pain.  Neurological: Positive for numbness.  Psychiatric/Behavioral: Positive for sleep disturbance.       Patient Active Problem List   Diagnosis Date Noted  . Hypertension   . GERD (gastroesophageal reflux disease)   . Diabetes   . Insomnia   . Neuropathy   . Rotator cuff tear 01/30/2014  . Dyspnea on exertion 12/26/2013  . Smoker 12/26/2013  . PAF (paroxysmal atrial fibrillation) 12/26/2013  . 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 STEMI (ST elevation myocardial infarction) 11/04/2013  . HTN (hypertension) 11/04/2013  . DM (diabetes mellitus) 11/04/2013  . HLD (hyperlipidemia) 11/04/2013  . Obesity 11/04/2013  . Cocaine abuse 11/04/2013  . H/O noncompliance with medical treatment, presenting hazards to health 11/04/2013  . CAD (coronary artery disease), native coronary artery 11/04/2013   Outpatient Encounter Prescriptions as of 08/07/2014  Medication Sig  . atorvastatin (LIPITOR) 20 MG tablet Take 1 tablet (20 mg total) by mouth daily.  . carvedilol (COREG) 12.5 MG tablet Take 1 tablet (12.5 mg total) by mouth 2 (two) times daily.  . citalopram (CELEXA) 20 MG tablet Take 20 mg by mouth daily.  Marland Kitchen gabapentin (NEURONTIN) 300 MG capsule Take 300 mg by mouth at bedtime.  . hydrochlorothiazide  (HYDRODIURIL) 25 MG tablet Take 1 tablet (25 mg total) by mouth daily.  . insulin aspart protamine- aspart (NOVOLOG MIX 70/30) (70-30) 100 UNIT/ML injection Inject 0.15 mLs (15 Units total) into the skin 2 (two) times daily with a meal.  . lisinopril (PRINIVIL,ZESTRIL) 40 MG tablet Take 1 tablet (40 mg total) by mouth daily.  . metFORMIN (GLUCOPHAGE) 1000 MG tablet Take 1 tablet (1,000 mg total) by mouth 2 (two) times daily with a meal.  . pantoprazole (PROTONIX) 40 MG tablet Take 40 mg by mouth daily.  . [DISCONTINUED] oxyCODONE-acetaminophen (PERCOCET/ROXICET) 5-325 MG per tablet Take 1 tablet by mouth every 4 (four) hours as needed.   No facility-administered encounter medications on file as of 08/07/2014.    Objective:   Physical Exam  Constitutional: He is oriented to person, place, and time. He appears well-developed and well-nourished.  Cardiovascular: Normal rate, regular rhythm and intact distal pulses.   Pulmonary/Chest: Effort normal and breath sounds normal.  Musculoskeletal: He exhibits no edema.  Neurological: He is alert and oriented to person, place, and time.  Again Tinel and Phalen tests were performed. Today, these were negative test which makes me think carpal tunnel syndrome is not a likely diagnosis  Psychiatric: He has a normal mood and affect. His behavior is normal.          Assessment & Plan:  1. Essential hypertension Blood pressure is elevated today continue with Coreg, lisinopril, and hydrochlorothiazide  2. HLD (hyperlipidemia) Her cardiology note, lipids are at goal.  3.  Type 2 diabetes mellitus with complication Last X4C was 6.8. He takes metformin and insulin. He is concerned about weight gain. Insulin was started while he was in the hospital at some point and this may be making his weight hard to control. Will consider discontinuing insulin depending on A1c and his willingness to monitor sugars with out insulin - POCT glycosylated hemoglobin (Hb  A1C)  4. Paresthesia of both hands Denies neck pain or pain in his arms. Unlikely carpal tunnel but will check TSH. Could be related to peripheral neuropathy secondary to diabetes - TSH

## 2014-08-07 NOTE — Addendum Note (Signed)
Addended by: Ilean China on: 08/07/2014 09:23 AM   Modules accepted: Orders

## 2014-08-08 ENCOUNTER — Telehealth: Payer: Self-pay | Admitting: *Deleted

## 2014-08-08 LAB — TSH: TSH: 2.4 u[IU]/mL (ref 0.450–4.500)

## 2014-08-08 NOTE — Telephone Encounter (Signed)
-----   Message from Wardell Honour, MD sent at 08/07/2014  3:47 PM EDT ----- Hemoglobin A1c has risen significantly from 6.8  To 8.8

## 2014-08-17 ENCOUNTER — Telehealth: Payer: Self-pay | Admitting: Family Medicine

## 2014-08-17 ENCOUNTER — Other Ambulatory Visit: Payer: Self-pay

## 2014-08-17 MED ORDER — PANTOPRAZOLE SODIUM 40 MG PO TBEC: 40.0000 mg | DELAYED_RELEASE_TABLET | Freq: Every day | ORAL | Status: DC

## 2014-09-07 ENCOUNTER — Encounter: Payer: Self-pay | Admitting: Cardiovascular Disease

## 2014-09-07 ENCOUNTER — Ambulatory Visit (INDEPENDENT_AMBULATORY_CARE_PROVIDER_SITE_OTHER): Payer: Medicaid Other | Admitting: Cardiovascular Disease

## 2014-09-07 VITALS — BP 128/78 | HR 76 | Ht 69.0 in | Wt 266.8 lb

## 2014-09-07 DIAGNOSIS — G4730 Sleep apnea, unspecified: Secondary | ICD-10-CM | POA: Diagnosis not present

## 2014-09-07 DIAGNOSIS — E669 Obesity, unspecified: Secondary | ICD-10-CM

## 2014-09-07 DIAGNOSIS — I10 Essential (primary) hypertension: Secondary | ICD-10-CM

## 2014-09-07 DIAGNOSIS — G4733 Obstructive sleep apnea (adult) (pediatric): Secondary | ICD-10-CM | POA: Diagnosis not present

## 2014-09-07 DIAGNOSIS — I2510 Atherosclerotic heart disease of native coronary artery without angina pectoris: Secondary | ICD-10-CM | POA: Diagnosis not present

## 2014-09-07 DIAGNOSIS — E785 Hyperlipidemia, unspecified: Secondary | ICD-10-CM

## 2014-09-07 DIAGNOSIS — I480 Paroxysmal atrial fibrillation: Secondary | ICD-10-CM

## 2014-09-07 DIAGNOSIS — Z951 Presence of aortocoronary bypass graft: Secondary | ICD-10-CM

## 2014-09-07 NOTE — Progress Notes (Signed)
Patient ID: Shane Frye, male   DOB: 1965/10/09, 49 y.o.   MRN: [id number]     HPI: Shane Frye is a 49 y.o. male who presents to the office today for a 4 month follow up cardiology evaluation.  [name] has a history of hypertension, type 2 diabetes mellitus, obesity, strong family history for premature coronary artery disease, and medical noncompliance.  On 11/04/2013 he presented to [facility] hospital in transfer from [facility] with possible  ST segment elevation anterior myocardial infarction.  He had taken cocaine the day before.  I performed emergent cardiac catheterization which revealed severe three-vessel CAD with evidence for coronary calcification.  There was an 80% eccentric hazy proximal LAD stenosis followed by 80% stenosis of the first diagonal branch, 80% mid LAD stenosis, 50% proximal circumflex stenosis with an 80% OM1 stenosis and 90% OM 2 stenosis and a dominant right coronary artery with 95% mid PDA stenosis and 90% distal RCA PLA stenoses.  Intervention was not performed and he was evaluated by Dr. Roxan Hockey and underwent CABG surgery 6 on 11/10/2013.  He developed transient PAF postoperatively and ultimately was discharged on amiodarone 400 mg twice a day.  He was seen by Kerin Ransom on 12/26/2013 and was maintaining sinus rhythm.  At that time, with his diabetes mellitus, hypertension, and edema, he was started on Prinzide.  When I saw him ininitially in follow-up he  denied any recurrent episodes of chest pain.  He had been taking amiodarone at 200 mg daily in addition to carvedilol 3.125 twice a day and was on lisinopril/HCTZ 10/12.5 daily, and recently he has been taking 2 tablets by mouth daily.  He is diabetic and has been taking metformin 1000 mg twice a day.  He apparently has been taking gemfibrozil 600 mg twice a day for hyperlipidemia.  At that time, I recommended he discontinue gemfibrozil and started him on atorvastatin 20 mg daily for more aggressive  lipid-lowering.  I recommended follow-up laboratory which was performed 2 months ago.  His cholesterol was 148, triglycerides 187 HDL 53, and LDL 58.  On his atorvastatin treatment.  On urinalysis , he had evidence for microalbuminuria at 36 g per mL.  A follow-up echo Doppler study 12/16/2014 showed an ejection fraction of 55-60%.  He had normal wall motion.  Since I last saw him 4 months ago, he denies any recurrent chest pain.  He continues to experience shortness of breath with walking.  He is unaware of any palpitations.  He does admit to some paresthesias of his hands and he has been on gabapentin by Dr. Sabra Heck.  He has been taking lisinopril 40 mg in the morning and HCTZ 25 mg as well as carvedilol 12.5 twice a day for his blood pressure.  He continues to be on aspirin at 81 mg.  He is tolerating Lipitor 20 mg.  LDL cholesterol in January was 58.  Triglycerides are mildly elevated at 187.  His diabetes has not been well controlled.  Recent blood work in June 2016 showed hemoglobin A1c of 8.8.  He presents for follow-up evaluation.  Upon further questioning, however, he admits to being very fatigued.  His sleep is extremely poor.  He wakes up at least 6 times per night.  He snores loudly and has had witnessed apnea by his daughter.  Past Medical History  Diagnosis Date  . Depression   . Insomnia   . Neuropathy   . Hypertension   . GERD (gastroesophageal reflux disease)   .  Diabetes mellitus   . Myocardial infarction 11/04/13    Past Surgical History  Procedure Laterality Date  . Cardioversion N/A 11/06/2013    Procedure: CARDIOVERSION;  Surgeon: Thayer Headings, MD;  Location: [place];  Service: Cardiovascular;  Laterality: N/A;  . Coronary artery bypass graft N/A 11/09/2013    Procedure: CORONARY ARTERY BYPASS GRAFTING (CABG);  Surgeon: Melrose Nakayama, MD;  Location: [place];  Service: Open Heart Surgery;  Laterality: N/A;  . Intraoperative transesophageal echocardiogram N/A 11/09/2013      Procedure: INTRAOPERATIVE TRANSESOPHAGEAL ECHOCARDIOGRAM;  Surgeon: Melrose Nakayama, MD;  Location: [place];  Service: Open Heart Surgery;  Laterality: N/A;  . Left heart catheterization with coronary angiogram N/A 11/04/2013    Procedure: LEFT HEART CATHETERIZATION WITH CORONARY ANGIOGRAM;  Surgeon: Troy Sine, MD;  Location: [facility];  Service: Cardiovascular;  Laterality: N/A;  . No past surgeries    . Colonoscopy N/A 05/11/2014    Procedure: COLONOSCOPY;  Surgeon: Rogene Houston, MD;  [facility];  Service: Endoscopy;  Laterality: N/A;  930    No Known Allergies  Current Outpatient Prescriptions  Medication Sig Dispense Refill  . aspirin 81 MG tablet Take 81 mg by mouth daily.    Marland Kitchen atorvastatin (LIPITOR) 20 MG tablet Take 1 tablet (20 mg total) by mouth daily. 90 tablet 3  . carvedilol (COREG) 12.5 MG tablet Take 1 tablet (12.5 mg total) by mouth 2 (two) times daily. 180 tablet 0  . citalopram (CELEXA) 20 MG tablet Take 20 mg by mouth daily.    Marland Kitchen gabapentin (NEURONTIN) 300 MG capsule Take 1 capsule (300 mg total) by mouth 2 (two) times daily. 60 capsule 1  . glucose blood test strip Use as instructed 100 each 12  . hydrochlorothiazide (HYDRODIURIL) 25 MG tablet Take 1 tablet (25 mg total) by mouth daily. 30 tablet 11  . insulin aspart protamine- aspart (NOVOLOG MIX 70/30) (70-30) 100 UNIT/ML injection Inject 0.15 mLs (15 Units total) into the skin 2 (two) times daily with a meal. 10 mL 11  . lisinopril (PRINIVIL,ZESTRIL) 40 MG tablet Take 1 tablet (40 mg total) by mouth daily. 30 tablet 11  . metFORMIN (GLUCOPHAGE) 1000 MG tablet Take 1 tablet (1,000 mg total) by mouth 2 (two) times daily with a meal. 60 tablet 3  . pantoprazole (PROTONIX) 40 MG tablet Take 1 tablet (40 mg total) by mouth daily. 30 tablet 5   No current facility-administered medications for this visit.    History   Social History  . Marital Status: Married    Spouse Name: N/A  . Number of  Children: N/A  . Years of Education: N/A   Occupational History  . Not on file.   Social History Main Topics  . Smoking status: Former Smoker -- 0.50 packs/day    Types: Cigarettes    Quit date: 11/10/2013  . Smokeless tobacco: Former Systems developer    Quit date: 11/04/2013  . Alcohol Use: No  . Drug Use: No     Comment: cocaine  . Sexual Activity: Yes   Other Topics Concern  . Not on file   Social History Narrative    Family History  Problem Relation Age of Onset  . Hypertension Paternal Grandfather   . Hypertension Brother   . Diabetes Brother     ROS General: Negative; No fevers, chills, or night sweats HEENT: Negative; No changes in vision or hearing, sinus congestion, difficulty swallowing Pulmonary: Negative; No cough, wheezing, shortness  of breath, hemoptysis Cardiovascular: See HPI: No chest pain, presyncope, syncope, palpatations GI: Negative; No nausea, vomiting, diarrhea, or abdominal pain GU: Negative; No dysuria, hematuria, or difficulty voiding Musculoskeletal: Negative; no myalgias, joint pain, or weakness Hematologic: Negative; no easy bruising, bleeding Endocrine: Negative; no heat/cold intolerance; no diabetes, Neuro: Negative; no changes in balance, headaches Skin: Negative; No rashes or skin lesions Psychiatric: Negative; No behavioral problems, depression Sleep: Positive for symptoms highly suggestive of obstructive sleep apnea; bruxism, restless legs, hypnogognic hallucinations. Other comprehensive 14 point system review is negative   Physical Exam BP 128/78 mmHg  Pulse 76  Ht 5\' 9"  (1.753 m)  Wt 266 lb 12.8 oz (121.02 kg)  BMI 39.38 kg/m2  Wt Readings from Last 3 Encounters:  09/07/14 266 lb 12.8 oz (121.02 kg)  08/07/14 265 lb (120.203 kg)  06/08/14 263 lb (119.296 kg)   General: Alert, oriented, no distress.  Skin: normal turgor, no rashes, warm and dry HEENT: Normocephalic, atraumatic. Pupils equal round and reactive to light; sclera  anicteric; extraocular muscles intact, No lid lag; Nose without nasal septal hypertrophy; Mouth/Parynx benign; Mallinpatti scale 3 Neck: No JVD, no carotid bruits; normal carotid upstroke Lungs: clear to ausculatation and percussion bilaterally; no wheezing or rales, normal inspiratory and expiratory effort Chest wall: without tenderness to palpitation Heart: PMI not displaced, RRR, s1 s2 normal, 1/6 systolic murmur, No diastolic murmur, no rubs, gallops, thrills, or heaves Abdomen: soft, nontender; no hepatosplenomehaly, BS+; abdominal aorta nontender and not dilated by palpation. Back: no CVA tenderness Pulses: 2+  Musculoskeletal: full range of motion, normal strength, no joint deformities Extremities: Pulses 2+, no clubbing cyanosis or edema, Homan's sign negative  Neurologic: grossly nonfocal; Cranial nerves grossly wnl Psychologic: Normal mood and affect  ECG (independently read by me): Normal sinus rhythm at 76 bpm.  Probably progression anteriorly.  No significant ST-T changes.  March 2016 ECG (independently read by me): Normal sinus rhythm at 70 bpm.  Mild T wave changes anterolaterally.  Q wave in lead 3.  Normal intervals.  ECG (independently read by me): Sinus bradycardia 57 bpm.  PR interval 186 ms.  Poor R wave progression.  LABS:  BMP Latest Ref Rng 06/08/2014 05/23/2014 11/14/2013  Glucose 70 - 99 mg/dL 180(H) 268(H) 148(H)  BUN 6 - 23 mg/dL 10 25(H) 20  Creatinine 0.50 - 1.35 mg/dL 0.70 0.98 0.71  Sodium 135 - 145 mmol/L 134(L) 135 131(L)  Potassium 3.5 - 5.1 mmol/L 4.4 4.8 5.1  Chloride 96 - 112 mmol/L 99 100 94(L)  CO2 19 - 32 mmol/L 28 25 24   Calcium 8.4 - 10.5 mg/dL 8.4 8.9 8.3(L)      Component Value Date/Time   PROT 6.3 11/08/2013 2135   ALBUMIN 3.1* 11/08/2013 2135   AST 42* 11/08/2013 2135   ALT 17 11/08/2013 2135   ALKPHOS 56 11/08/2013 2135   BILITOT 0.5 11/08/2013 2135    CBC Latest Ref Rng 06/08/2014 03/02/2014 11/14/2013  WBC 4.0 - 10.5 K/uL 10.0  11.5(A) 8.4  Hemoglobin 13.0 - 17.0 g/dL 13.5 13.5(A) 11.2(L)  Hematocrit 39.0 - 52.0 % 42.2 44.4 34.2(L)  Platelets 150 - 400 K/uL 243 - 321   Lab Results  Component Value Date   MCV 82.4 06/08/2014   MCV 78.4* 03/02/2014   MCV 86.8 11/14/2013   Lab Results  Component Value Date   TSH 2.400 08/07/2014   Lab Results  Component Value Date   HGBA1C 8.8 08/07/2014    BNP No results found for: PROBNP  Lipid Panel     Component Value Date/Time   CHOL 148 03/02/2014 0910   CHOL 149 11/06/2013 0300   TRIG 187* 03/02/2014 0910   HDL 53 03/02/2014 0910   HDL 44 11/06/2013 0300   CHOLHDL 2.8 03/02/2014 0910   CHOLHDL 3.4 11/06/2013 0300   VLDL 39 11/06/2013 0300   LDLCALC 58 03/02/2014 0910   LDLCALC 66 11/06/2013 0300     RADIOLOGY: No results found.    ASSESSMENT AND PLAN: Mr. Blake Vetrano is a 49 year old gentleman with a cardiac risk factor profile notable for hypertension, diabetes mellitus, prior tobacco use, as well as previous use of cocaine. He was found to have severe three-vessel coronary obstructive disease at urgent cardiac catheterization and was treated with CABG surgery 6 in September 2015.  He is maintaining sinus rhythm and has not had any recurrent atrial fibrillation being off amiodarone.  He is not having any anginal symptomatology.  His blood pressure today is stable on his lisinopril 40 mg, HCTZ 25 mg and carvedilol.  He is tolerating atorvastatin for hyperlipidemia with LDL cholesterol at target.  However, triglycerides remain mildly elevated.  His recent hemoglobin A1c was increased at 8.8.  He will need more aggressive diabetic management.  We discussed the importance of weight loss with his BMI of 39.4 and he is approaching morbid obesity.  His echo Doppler study was reviewed with him in detail.  Ejection fraction is normal.  I am very concerned that he may have significant obstructive sleep apnea.  He has classic symptomatology and body habitus.   Presently, he is waking up at least 6 times per night.  He cannot sleep on his back.  He snores loudly, and has had witnessed apnea.  He admits to significant fatigue during the day and his sleep is nonrestorative.  I'm scheduling him for split-night protocol sleep study at [facility] long sleep lab as soon as this can be arranged.  I discussed the adverse consequences of untreated sleep apnea with reference to cardiovascular morbidity and mortality.  I will see him in 4 months for cardiology reevaluation or sooner if problem arise.   Time spent: 25 minutes   Troy Sine, MD, [facility]  09/07/2014 9:19 AM

## 2014-09-07 NOTE — Patient Instructions (Signed)
Your physician wants you to follow-up in: 4 Months. You will receive a reminder letter in the mail two months in advance. If you don't receive a letter, please call our office to schedule the follow-up appointment.  Your physician has recommended that you have a sleep study. This test records several body functions during sleep, including: brain activity, eye movement, oxygen and carbon dioxide blood levels, heart rate and rhythm, breathing rate and rhythm, the flow of air through your mouth and nose, snoring, body muscle movements, and chest and belly movement.

## 2014-09-11 ENCOUNTER — Telehealth: Payer: Self-pay | Admitting: Cardiovascular Disease

## 2014-09-11 NOTE — Telephone Encounter (Signed)
I was referring him for a sleep study and discussed probable need for CPAP

## 2014-09-11 NOTE — Telephone Encounter (Signed)
Left msg w/ family member to have patient call back.

## 2014-09-11 NOTE — Telephone Encounter (Signed)
Called patient - he was expecting call w/ recommendation on what to take - informed me Dr. Claiborne Billings was going to prescribe something "to help me breathe better" r/t sleep apnea.  Informed pt i did not see indication for starting anything based on last office note, would route to Dr. Claiborne Billings for recommendation. Pt voiced understanding.

## 2014-09-11 NOTE — Telephone Encounter (Signed)
Pt called in stating that when he was in the office on 7/21, Dr.Kelly was going to prescribe a new medication for him , he wasn't sure the name of it but he says that it had not been called in to his pharmacy yet. Can you help with that?  Thanks

## 2014-09-12 NOTE — Telephone Encounter (Signed)
Spoke to Shane Frye Shane Frye states he has an appointment for sleep study in Oct 2016. Shane Frye states that at the last appointment in addition to having sleep study schedule,Dr [facility] told him that he will send something into the pharmacy to help him until sleep study is done. RN informed Shane Frye the last office visit does not indicate a new medication. Shane Frye wanted to re ask Dr Claiborne Billings.

## 2014-09-13 ENCOUNTER — Other Ambulatory Visit: Payer: Self-pay | Admitting: Family Medicine

## 2014-09-13 MED ORDER — ZOLPIDEM TARTRATE 10 MG PO TABS
10.0000 mg | ORAL_TABLET | Freq: Every evening | ORAL | Status: DC | PRN
Start: 2014-09-13 — End: 2014-10-17

## 2014-09-13 NOTE — Telephone Encounter (Signed)
Can try zolpidem 10 mg hs PRN

## 2014-09-13 NOTE — Telephone Encounter (Signed)
CALLED PRESCRIPTION INTO PHARMACY  SPOKE TO Marlowe Kays - pharmacist.

## 2014-09-13 NOTE — Telephone Encounter (Signed)
Spoke to wife   informed her medication was called in to pharmacy -sleeping agent She verbalized that she will inform patient.

## 2014-10-05 ENCOUNTER — Other Ambulatory Visit: Payer: Self-pay | Admitting: Family Medicine

## 2014-10-10 ENCOUNTER — Ambulatory Visit: Payer: Medicaid Other | Admitting: Family Medicine

## 2014-10-11 ENCOUNTER — Encounter: Payer: Self-pay | Admitting: Family Medicine

## 2014-10-17 ENCOUNTER — Ambulatory Visit (INDEPENDENT_AMBULATORY_CARE_PROVIDER_SITE_OTHER): Payer: Medicaid Other | Admitting: Family Medicine

## 2014-10-17 ENCOUNTER — Encounter: Payer: Self-pay | Admitting: Family Medicine

## 2014-10-17 VITALS — BP 142/78 | HR 76 | Temp 96.8°F | Ht 69.0 in | Wt 266.0 lb

## 2014-10-17 DIAGNOSIS — E0959 Drug or chemical induced diabetes mellitus with other circulatory complications: Secondary | ICD-10-CM | POA: Diagnosis not present

## 2014-10-17 DIAGNOSIS — I10 Essential (primary) hypertension: Secondary | ICD-10-CM

## 2014-10-17 DIAGNOSIS — G629 Polyneuropathy, unspecified: Secondary | ICD-10-CM

## 2014-10-17 MED ORDER — LORAZEPAM 0.5 MG PO TABS: 0.5000 mg | ORAL_TABLET | Freq: Every evening | ORAL | Status: DC | PRN

## 2014-10-17 NOTE — Progress Notes (Signed)
Subjective:    Patient ID: Shane Frye, male    DOB: 1965-08-06, 49 y.o.   MRN: [id number]  HPI 49 year old gentleman here to follow-up diabetes and peripheral neuropathy of the upper extremities. I had thought at one time he may have carpal tunnel syndrome symptoms are not really consistent with that. He does have a long-standing history of diabetes and has had neuropathy in the lower extremities and by his account this feels the same. He is taking gabapentin 300 mg twice a day but complains that that makes him sleepy. He is concerned about weight gain. Previously we had talked about discontinuing insulin in favor of other medicines to control his sugar and I am willing to discontinue the insulin. I would expect to add either a sulfonylurea or one of the newer agents to his diabetic treatment to control sugars however  Patient Active Problem List   Diagnosis Date Noted  . Evaluate for OSA (obstructive sleep apnea) 09/07/2014  . Hypertension   . GERD (gastroesophageal reflux disease)   . Diabetes   . Insomnia   . Neuropathy   . Rotator cuff tear 01/30/2014  . Dyspnea on exertion 12/26/2013  . Smoker 12/26/2013  . PAF (paroxysmal atrial fibrillation) 12/26/2013  . 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 STEMI (ST elevation myocardial infarction) 11/04/2013  . HTN (hypertension) 11/04/2013  . DM (diabetes mellitus) 11/04/2013  . HLD (hyperlipidemia) 11/04/2013  . Obesity 11/04/2013  . Cocaine abuse 11/04/2013  . H/O noncompliance with medical treatment, presenting hazards to health 11/04/2013  . CAD (coronary artery disease), native coronary artery 11/04/2013   Outpatient Encounter Prescriptions as of 10/17/2014  Medication Sig  . aspirin 81 MG tablet Take 81 mg by mouth daily.  Marland Kitchen atorvastatin (LIPITOR) 20 MG tablet Take 1 tablet (20 mg total) by mouth daily.  . carvedilol (COREG) 12.5 MG tablet Take 1 tablet (12.5 mg total) by mouth 2 (two) times daily.  . citalopram (CELEXA) 20 MG tablet Take  20 mg by mouth daily.  Marland Kitchen gabapentin (NEURONTIN) 300 MG capsule TAKE ONE CAPSULE BY MOUTH TWICE DAILY  . glucose blood test strip Use as instructed  . hydrochlorothiazide (HYDRODIURIL) 25 MG tablet Take 1 tablet (25 mg total) by mouth daily.  . insulin aspart protamine- aspart (NOVOLOG MIX 70/30) (70-30) 100 UNIT/ML injection Inject 0.15 mLs (15 Units total) into the skin 2 (two) times daily with a meal.  . lisinopril (PRINIVIL,ZESTRIL) 40 MG tablet Take 1 tablet (40 mg total) by mouth daily.  . metFORMIN (GLUCOPHAGE) 1000 MG tablet TAKE ONE TABLET BY MOUTH TWICE DAILY WITH A MEAL  . pantoprazole (PROTONIX) 40 MG tablet Take 1 tablet (40 mg total) by mouth daily.  . [DISCONTINUED] zolpidem (AMBIEN) 10 MG tablet Take 1 tablet (10 mg total) by mouth at bedtime as needed for sleep.   No facility-administered encounter medications on file as of 10/17/2014.      Review of Systems  Constitutional: Positive for unexpected weight change.  HENT: Negative.   Respiratory: Negative.   Neurological: Negative.   Psychiatric/Behavioral: Negative.        Objective:   Physical Exam  Constitutional: He is oriented to person, place, and time. He appears well-developed and well-nourished.  Neck:  Patient does have an increased neck circumference and cardiologist as scheduled him for a sleep study  Cardiovascular: Normal rate and regular rhythm.   Pulmonary/Chest: Effort normal and breath sounds normal.  Neurological: He is alert and oriented to person, place, and  time.     BP 142/78 mmHg  Pulse 76  Temp(Src) 96.8 F (36 C) (Oral)  Ht 5\' 9"  (1.753 m)  Wt 266 lb (120.657 kg)  BMI 39.26 kg/m2        Assessment & Plan:  1. 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 Essential hypertension Blood pressure controlled on  hydrochlorothiazide and lisinopril  3. Neuropathy Continue gabapentin but will rule out disc disease as a potential cause of neuropathy  Wardell Honour MD

## 2014-10-17 NOTE — Patient Instructions (Addendum)
A referral to Dr Radford Pax has been ordered for your diabetic eye exam. You should receive a call within the next week about this appointment. You may receive a survey either by mail or email. Please take time to complete this as it helps [place] to serve you better.

## 2014-10-31 ENCOUNTER — Other Ambulatory Visit (HOSPITAL_COMMUNITY): Payer: Self-pay | Admitting: Emergency Medicine

## 2014-10-31 ENCOUNTER — Ambulatory Visit (HOSPITAL_COMMUNITY)
Admission: RE | Admit: 2014-10-31 | Discharge: 2014-10-31 | Disposition: A | Discharge status: Home / Self Care | Payer: Medicaid Other | Source: Ambulatory Visit | Attending: Emergency Medicine | Admitting: Emergency Medicine

## 2014-10-31 ENCOUNTER — Telehealth: Payer: Self-pay | Admitting: Family Medicine

## 2014-10-31 ENCOUNTER — Ambulatory Visit (HOSPITAL_COMMUNITY)
Admission: RE | Admit: 2014-10-31 | Discharge: 2014-10-31 | Disposition: A | Discharge status: Home / Self Care | Payer: Medicaid Other | Source: Ambulatory Visit | Attending: Family Medicine | Admitting: Family Medicine

## 2014-10-31 DIAGNOSIS — Z01818 Encounter for other preprocedural examination: Secondary | ICD-10-CM

## 2014-10-31 DIAGNOSIS — Z1389 Encounter for screening for other disorder: Secondary | ICD-10-CM | POA: Insufficient documentation

## 2014-10-31 DIAGNOSIS — E0959 Drug or chemical induced diabetes mellitus with other circulatory complications: Secondary | ICD-10-CM

## 2014-11-09 ENCOUNTER — Other Ambulatory Visit: Payer: Self-pay | Admitting: Family Medicine

## 2014-11-09 MED ORDER — LORAZEPAM 0.5 MG PO TABS: 0.5000 mg | ORAL_TABLET | Freq: Every evening | ORAL | Status: DC | PRN

## 2014-11-09 NOTE — Telephone Encounter (Signed)
If approved route to pools to be called into [place]

## 2014-11-13 ENCOUNTER — Telehealth: Payer: Self-pay | Admitting: Family Medicine

## 2014-11-13 NOTE — Telephone Encounter (Signed)
Patient aware.

## 2014-11-13 NOTE — Telephone Encounter (Signed)
Please okay one refill on this--- and have him follow-up with Dr. Sabra Heck in one month

## 2014-11-14 NOTE — Progress Notes (Signed)
Eats MRI scheduled at [facility]

## 2014-11-15 NOTE — Telephone Encounter (Signed)
Done in another encounter on 11/13/14. Closing this encounter.

## 2014-11-18 ENCOUNTER — Ambulatory Visit
Admission: RE | Admit: 2014-11-18 | Discharge: 2014-11-18 | Disposition: A | Discharge status: Home / Self Care | Payer: Medicaid Other | Source: Ambulatory Visit | Attending: Family Medicine | Admitting: Family Medicine

## 2014-11-18 ENCOUNTER — Other Ambulatory Visit: Payer: Self-pay | Admitting: Family Medicine

## 2014-11-18 DIAGNOSIS — E0959 Drug or chemical induced diabetes mellitus with other circulatory complications: Secondary | ICD-10-CM

## 2014-11-23 ENCOUNTER — Telehealth: Payer: Self-pay | Admitting: Family Medicine

## 2014-11-23 IMAGING — CR DG CHEST 2V
2 series · 2 of 2 positions shown · non-contrast
Comparison: 11/12/2013.

CLINICAL DATA: Some chest pain and shortness of breath. History of
smoking. History of heart surgery in October 2013.

EXAM:
CHEST  2 VIEW

[w chest pa]
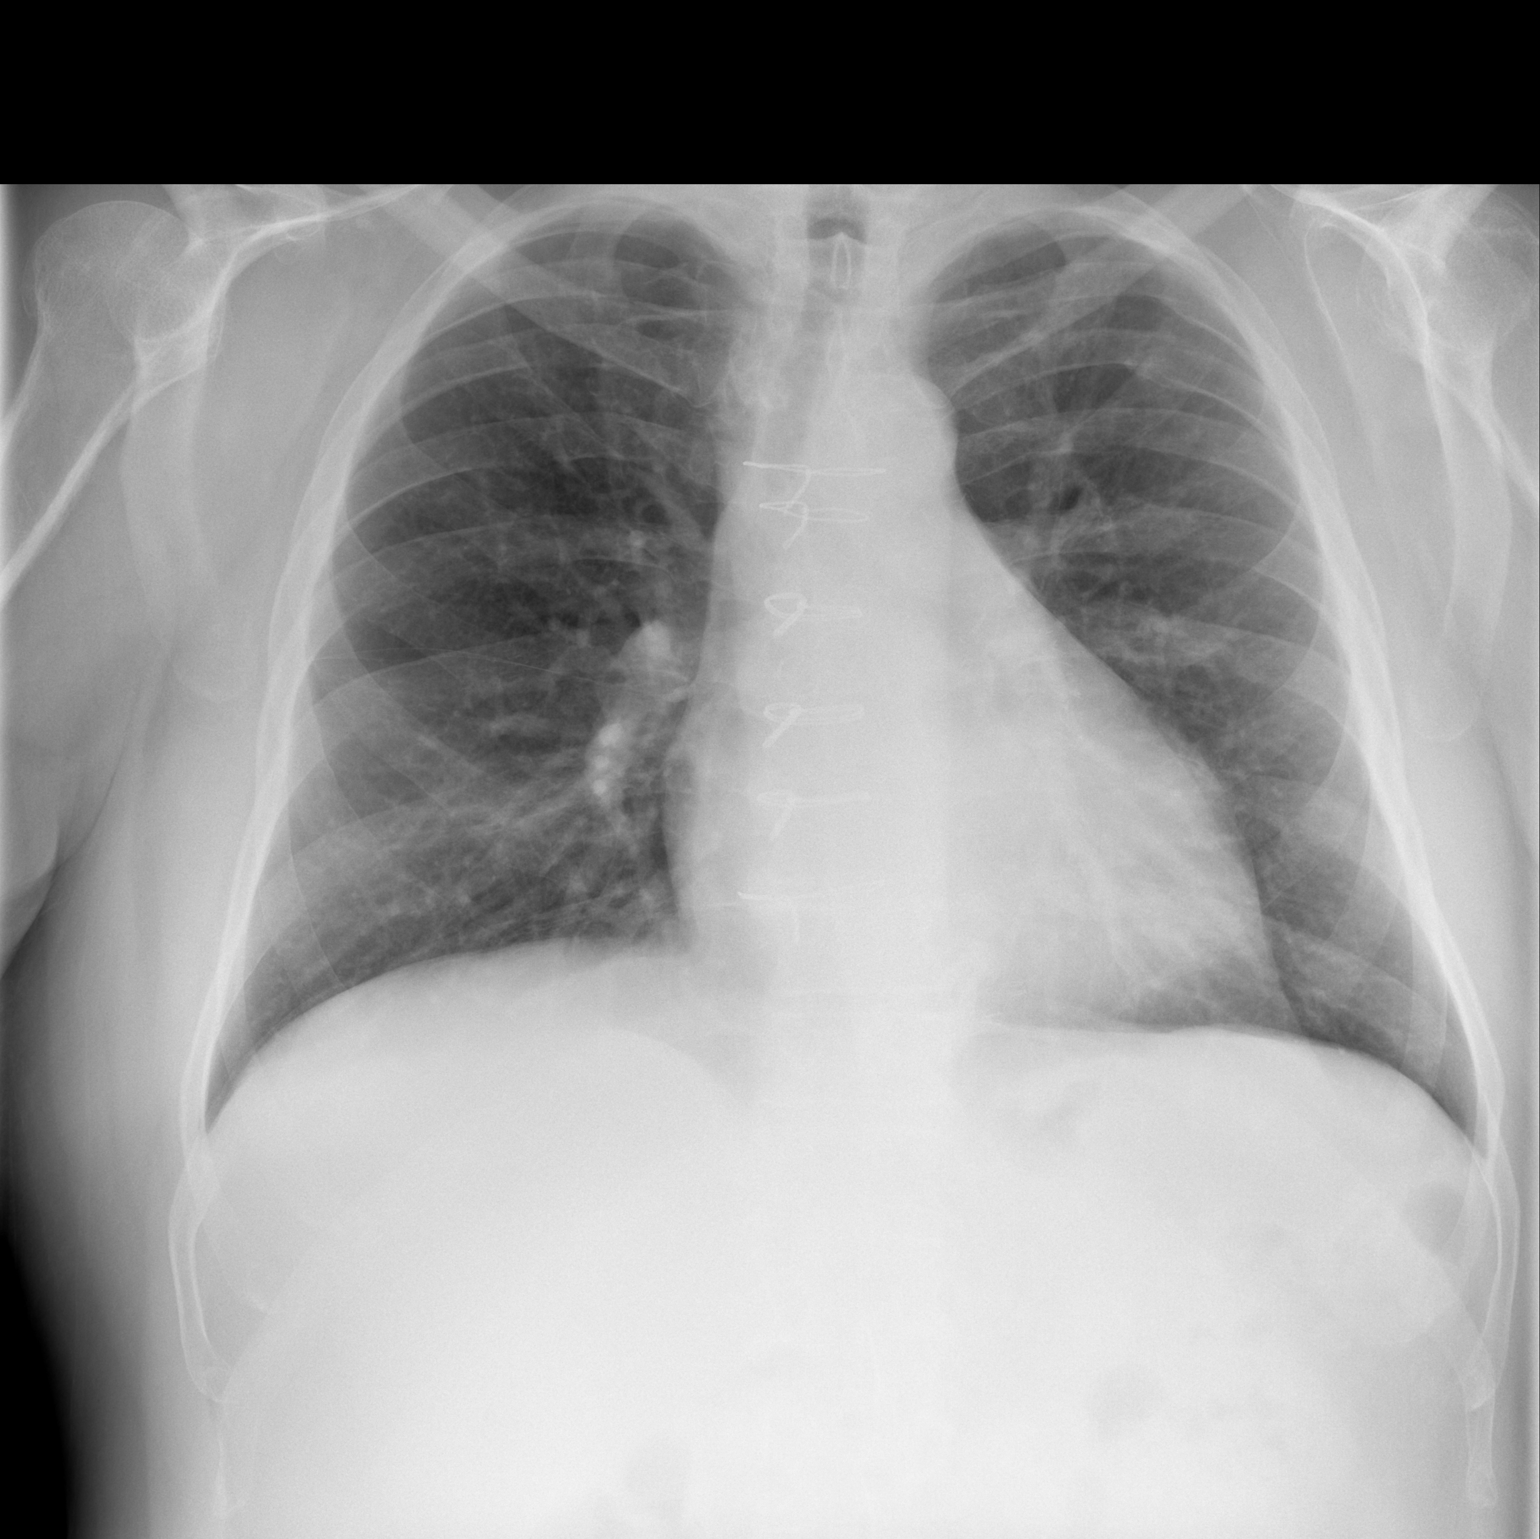

[w chest lat]
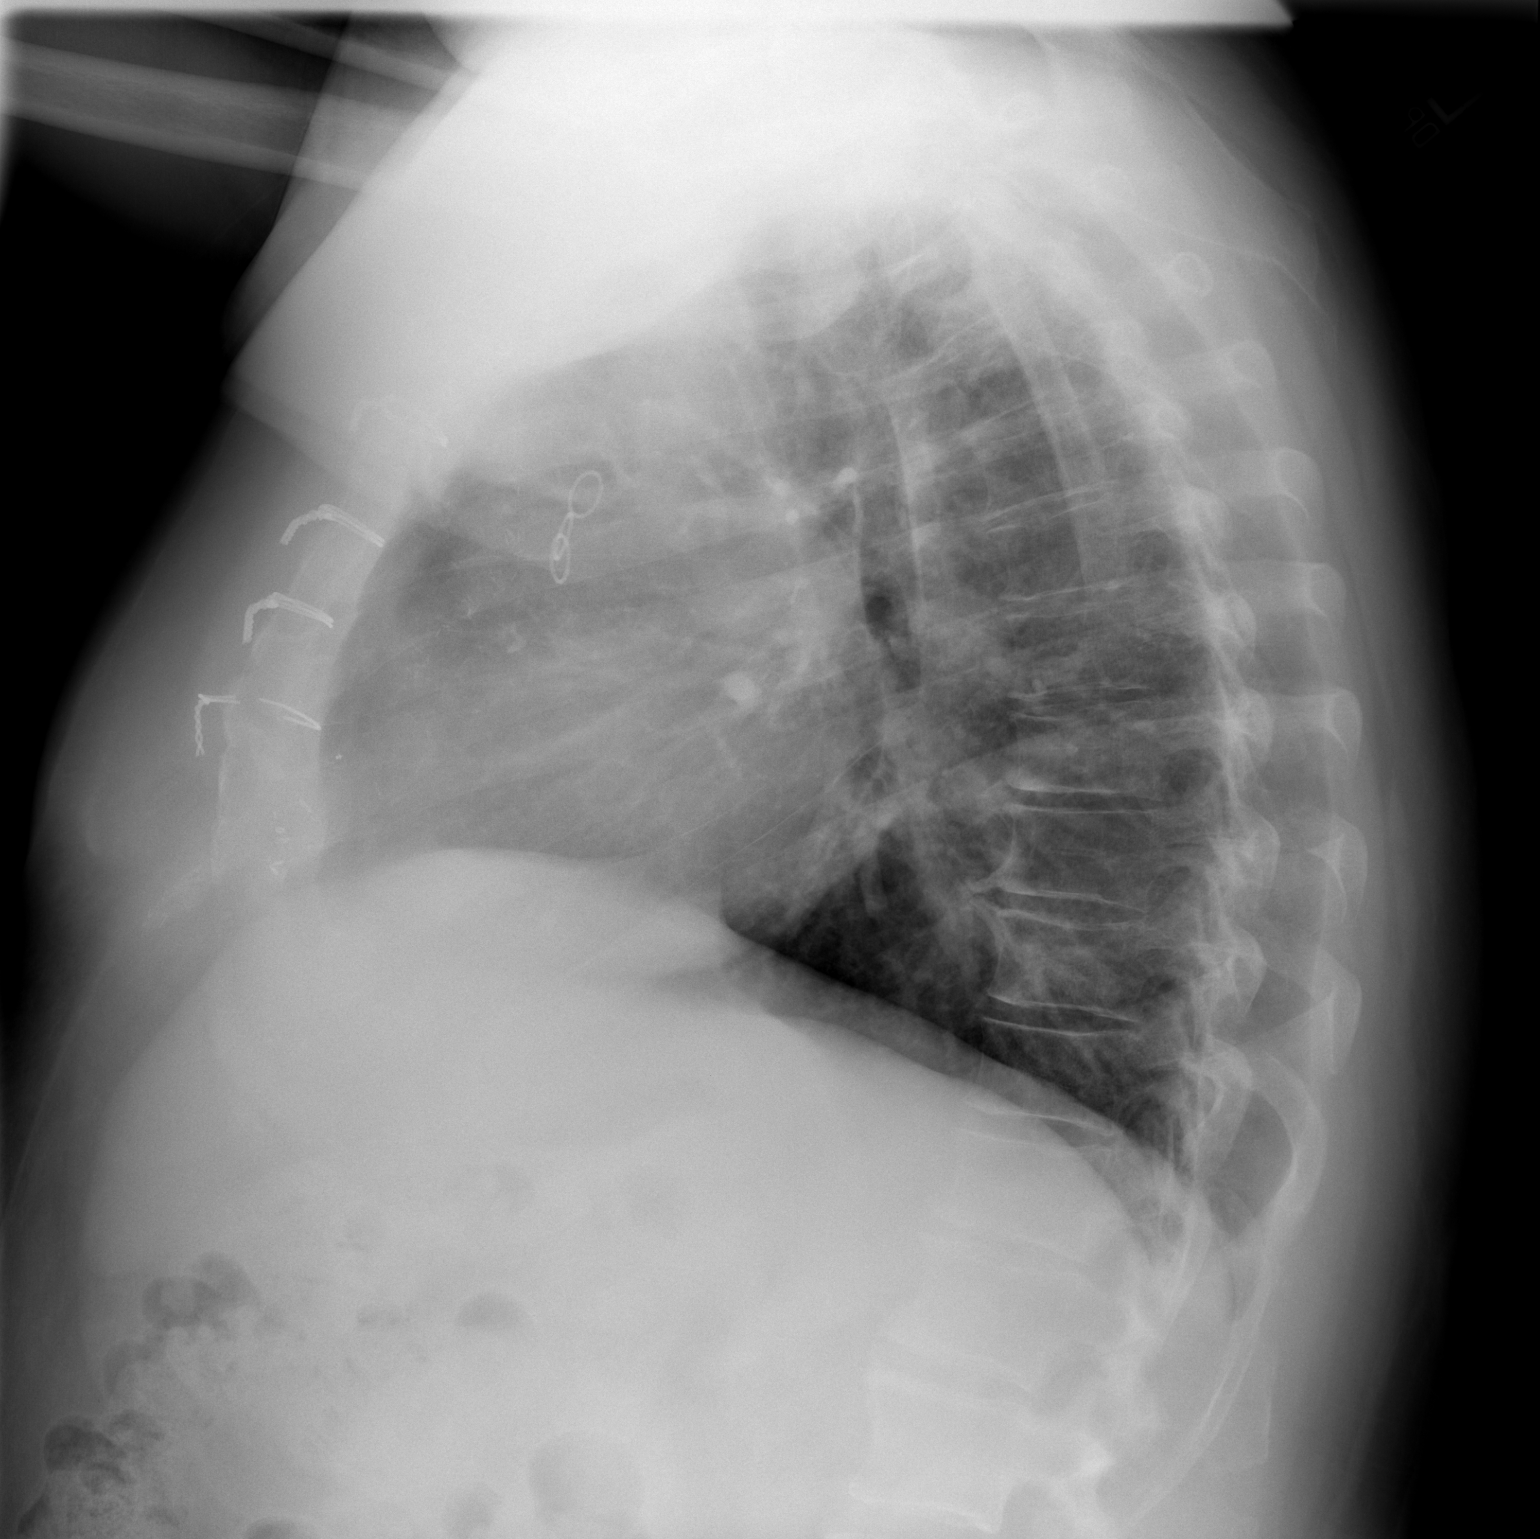

[2 of 2 positions shown; findings below may reference images not displayed]

FINDINGS: Cardiac silhouette is mildly enlarged. Changes from CABG surgery are
stable. Normal mediastinal and hilar contours.

Lungs are clear. No pleural effusion or pneumothorax. There are old
healed rib fractures on the left.
IMPRESSION: No acute cardiopulmonary disease.

## 2014-11-23 NOTE — Telephone Encounter (Signed)
No vm to leave a message to return call

## 2014-11-24 ENCOUNTER — Telehealth: Payer: Self-pay | Admitting: Family Medicine

## 2014-11-24 NOTE — Telephone Encounter (Signed)
Pt aware of MRI results and has scheduled a sooner f/u appointment to discuss next step for him

## 2014-11-26 ENCOUNTER — Encounter (HOSPITAL_BASED_OUTPATIENT_CLINIC_OR_DEPARTMENT_OTHER): Payer: Medicaid Other

## 2014-12-06 ENCOUNTER — Ambulatory Visit: Payer: Medicaid Other | Admitting: Family Medicine

## 2014-12-27 ENCOUNTER — Encounter (HOSPITAL_BASED_OUTPATIENT_CLINIC_OR_DEPARTMENT_OTHER): Payer: Medicaid Other

## 2015-01-08 ENCOUNTER — Ambulatory Visit: Payer: Medicaid Other | Admitting: Cardiovascular Disease

## 2015-01-10 ENCOUNTER — Ambulatory Visit: Payer: Medicaid Other | Admitting: Family Medicine

## 2015-01-25 ENCOUNTER — Ambulatory Visit (INDEPENDENT_AMBULATORY_CARE_PROVIDER_SITE_OTHER): Payer: Medicaid Other | Admitting: Family Medicine

## 2015-01-25 ENCOUNTER — Encounter: Payer: Self-pay | Admitting: Family Medicine

## 2015-01-25 ENCOUNTER — Other Ambulatory Visit: Payer: Self-pay | Admitting: Family Medicine

## 2015-01-25 VITALS — Temp 97.1°F | Ht 69.0 in | Wt 261.2 lb

## 2015-01-25 DIAGNOSIS — E0959 Drug or chemical induced diabetes mellitus with other circulatory complications: Secondary | ICD-10-CM | POA: Diagnosis not present

## 2015-01-25 DIAGNOSIS — E785 Hyperlipidemia, unspecified: Secondary | ICD-10-CM | POA: Diagnosis not present

## 2015-01-25 DIAGNOSIS — I10 Essential (primary) hypertension: Secondary | ICD-10-CM | POA: Diagnosis not present

## 2015-01-25 DIAGNOSIS — R799 Abnormal finding of blood chemistry, unspecified: Secondary | ICD-10-CM

## 2015-01-25 LAB — POCT GLYCOSYLATED HEMOGLOBIN (HGB A1C): HEMOGLOBIN A1C: 10.6

## 2015-01-25 LAB — GLUCOSE, POCT (MANUAL RESULT ENTRY): POC Glucose: 231 mg/dl — AB (ref 70–99)

## 2015-01-25 NOTE — Progress Notes (Signed)
Subjective:    Patient ID: Shane Frye, male    DOB: May 30, 1965, 49 y.o.   MRN: [id number]  HPI 49 year old gentleman here to follow-up diabetes, lipids, and neuropathy. He was hospitalized for bronchitis and October. He denies any current respiratory symptoms. Current diabetes management include NovoLog 7030 as well as metformin. He still has problems with numbness and tingling in his upper extremities. Symptoms are not consistent with carpal tunnel in the he does not have worse nighttime symptoms and exam including Tinel and Phalen's tests are negative. 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 He takes Neurontin 300 mg twice a day and has been on that for some time.  Patient Active Problem List   Diagnosis Date Noted  . Evaluate for OSA (obstructive sleep apnea) 09/07/2014  . Hypertension   . GERD (gastroesophageal reflux disease)   . Diabetes ([place])   . Insomnia   . Neuropathy ([place])   . Rotator cuff tear 01/30/2014  . Dyspnea on exertion 12/26/2013  . Smoker 12/26/2013  . PAF (paroxysmal atrial fibrillation) ([place]) 12/26/2013  . 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 STEMI (ST elevation myocardial infarction) ([place]) 11/04/2013  . HTN (hypertension) 11/04/2013  . DM (diabetes mellitus) ([place]) 11/04/2013  . HLD (hyperlipidemia) 11/04/2013  . Obesity 11/04/2013  . Cocaine abuse 11/04/2013  . H/O noncompliance with medical treatment, presenting hazards to health 11/04/2013  . CAD (coronary artery disease), native coronary artery 11/04/2013   Outpatient Encounter Prescriptions as of 01/25/2015  Medication Sig  . aspirin 81 MG tablet Take 81 mg by mouth daily.  Marland Kitchen atorvastatin (LIPITOR) 20 MG tablet Take 1 tablet (20 mg total) by mouth daily.  . carvedilol (COREG) 12.5 MG tablet Take 1 tablet (12.5 mg total) by mouth 2 (two) times daily.  . citalopram (CELEXA) 20 MG tablet Take 20 mg  by mouth daily.  Marland Kitchen gabapentin (NEURONTIN) 300 MG capsule TAKE ONE CAPSULE BY MOUTH TWICE DAILY  . glucose blood test strip Use as instructed  . hydrochlorothiazide (HYDRODIURIL) 25 MG tablet Take 1 tablet (25 mg total) by mouth daily.  . insulin aspart protamine- aspart (NOVOLOG MIX 70/30) (70-30) 100 UNIT/ML injection Inject 15 Units into the skin 3 (three) times daily.  Marland Kitchen lisinopril (PRINIVIL,ZESTRIL) 40 MG tablet Take 1 tablet (40 mg total) by mouth daily.  . metFORMIN (GLUCOPHAGE) 1000 MG tablet TAKE ONE TABLET BY MOUTH TWICE DAILY WITH A MEAL  . pantoprazole (PROTONIX) 40 MG tablet Take 1 tablet (40 mg total) by mouth daily.  . [DISCONTINUED] LORazepam (ATIVAN) 0.5 MG tablet Take 1 tablet (0.5 mg total) by mouth at bedtime as needed for anxiety.   No facility-administered encounter medications on file as of 01/25/2015.      Review of Systems  Constitutional: Negative.   Respiratory: Negative.   Cardiovascular: Negative.   Neurological: Positive for numbness.       Objective:   Physical Exam  Constitutional: He is oriented to person, place, and time. He appears well-developed and well-nourished.  Cardiovascular: Normal rate and regular rhythm.   Pulmonary/Chest: Effort normal and breath sounds normal.  Neurological: He is alert and oriented to person, place, and time. He displays normal reflexes. No cranial nerve deficit. He exhibits normal muscle tone.          Assessment & Plan:  1. Essential hypertension Blood pressure is about the same on  lisinopril and carvedilol. - POCT glycosylated hemoglobin (Hb A1C) - Culture, Group A Strep - Lipid panel - Microalbumin / creatinine urine ratio  2. 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 Will check again today. Since his numbness and tingling in upper extremities is likely related to diabetes will push Neurontin to 1500 mg a day and  recheck in one month consider referral to neurology if not improved - POCT glycosylated hemoglobin (Hb A1C) - Culture, Group A Strep - Lipid panel - Microalbumin / creatinine urine ratio  3. HLD (hyperlipidemia) Lipids were at goal when last checked in January 11 months ago - POCT glycosylated hemoglobin (Hb A1C) - Culture, Group A Strep - Lipid panel - Microalbumin / creatinine urine ratio  4. Abnormal blood findings This is related to his diabetes - POCT glucose (manual entry)  Wardell Honour MD

## 2015-01-25 NOTE — Addendum Note (Signed)
Addended by: Jamelle Haring on: 01/25/2015 04:18 PM   Modules accepted: Orders

## 2015-01-26 LAB — MICROALBUMIN / CREATININE URINE RATIO
CREATININE, UR: 60.9 mg/dL
MICROALB/CREAT RATIO: 9.9 mg/g creat (ref 0.0–30.0)
Microalbumin, Urine: 6 ug/mL

## 2015-01-26 LAB — LIPID PANEL
CHOL/HDL RATIO: 3.9 ratio (ref 0.0–5.0)
Cholesterol, Total: 134 mg/dL (ref 100–199)
HDL: 34 mg/dL — AB (ref >39)
LDL CALC: 60 mg/dL (ref 0–99)
TRIGLYCERIDES: 199 mg/dL — AB (ref 0–149)
VLDL CHOLESTEROL CAL: 40 mg/dL (ref 5–40)

## 2015-01-29 ENCOUNTER — Telehealth: Payer: Self-pay | Admitting: Family Medicine

## 2015-01-29 NOTE — Telephone Encounter (Signed)
Tried to call patient to see if he had enough medicine until tomorrow because Dr Sabra Heck would be back. No answer on home or cell phone

## 2015-01-29 NOTE — Telephone Encounter (Signed)
Patient does have enough medication until Dr Sabra Heck can address tomorrow

## 2015-01-30 ENCOUNTER — Other Ambulatory Visit: Payer: Self-pay | Admitting: Cardiovascular Disease

## 2015-01-30 MED ORDER — GABAPENTIN 300 MG PO CAPS: ORAL_CAPSULE | ORAL | Status: DC

## 2015-01-30 MED ORDER — GABAPENTIN 300 MG PO CAPS: 300.0000 mg | ORAL_CAPSULE | Freq: Three times a day (TID) | ORAL | Status: DC

## 2015-01-30 NOTE — Addendum Note (Signed)
Addended by: Marylin Crosby on: 01/30/2015 09:42 AM   Modules accepted: Orders

## 2015-01-30 NOTE — Telephone Encounter (Signed)
Gabapentin dose changed and reordered and sent to pharmacy to 3 times a day per order of Dr Sabra Heck Pt notified

## 2015-01-30 NOTE — Telephone Encounter (Signed)
I called patient and told him you wanted him to take the Gabapentin 300mg  capsules TID and he reports that is not what you discussed that you told him to to take 2 in the am and 1 at lunch and 2 at bedtime. He insisted that I talk to you again. Anyway you can call him on his cell [phone]

## 2015-01-30 NOTE — Telephone Encounter (Signed)
Verified with Dr Sabra Heck Gabapentin 300mg  TID -2 in the am and 1 at [facility] and 2 in the pm #150 (5 pills per day). Rx changed and resent to the [facility] in [place]. Pt notified

## 2015-01-30 NOTE — Telephone Encounter (Signed)
Gabapentin dose should be 3 times a day not 5 times a day

## 2015-01-31 ENCOUNTER — Telehealth: Payer: Self-pay | Admitting: Family Medicine

## 2015-01-31 NOTE — Telephone Encounter (Signed)
done

## 2015-02-07 ENCOUNTER — Ambulatory Visit: Payer: Medicaid Other | Admitting: Family Medicine

## 2015-02-17 ENCOUNTER — Other Ambulatory Visit: Payer: Self-pay | Admitting: Family

## 2015-02-20 ENCOUNTER — Other Ambulatory Visit: Payer: Self-pay | Admitting: Family Medicine

## 2015-02-20 NOTE — Telephone Encounter (Signed)
Aware, refill done. 

## 2015-02-25 ENCOUNTER — Other Ambulatory Visit: Payer: Self-pay | Admitting: Cardiovascular Disease

## 2015-02-26 ENCOUNTER — Ambulatory Visit (INDEPENDENT_AMBULATORY_CARE_PROVIDER_SITE_OTHER): Payer: Medicaid Other | Admitting: Family Medicine

## 2015-02-26 ENCOUNTER — Encounter: Payer: Self-pay | Admitting: Family Medicine

## 2015-02-26 VITALS — BP 123/75 | HR 74 | Temp 97.0°F | Ht 69.0 in | Wt 259.0 lb

## 2015-02-26 DIAGNOSIS — E0959 Drug or chemical induced diabetes mellitus with other circulatory complications: Secondary | ICD-10-CM | POA: Diagnosis not present

## 2015-02-26 DIAGNOSIS — G629 Polyneuropathy, unspecified: Secondary | ICD-10-CM | POA: Diagnosis not present

## 2015-02-26 DIAGNOSIS — I10 Essential (primary) hypertension: Secondary | ICD-10-CM | POA: Diagnosis not present

## 2015-02-26 MED ORDER — CARVEDILOL 12.5 MG PO TABS: 12.5000 mg | ORAL_TABLET | Freq: Two times a day (BID) | ORAL | Status: DC

## 2015-02-26 MED ORDER — ATORVASTATIN CALCIUM 20 MG PO TABS: 20.0000 mg | ORAL_TABLET | Freq: Every day | ORAL | Status: DC

## 2015-02-26 MED ORDER — METFORMIN HCL 1000 MG PO TABS: ORAL_TABLET | ORAL | Status: DC

## 2015-02-26 MED ORDER — HYDROCHLOROTHIAZIDE 25 MG PO TABS: 25.0000 mg | ORAL_TABLET | Freq: Every day | ORAL | Status: DC

## 2015-02-26 MED ORDER — PANTOPRAZOLE SODIUM 40 MG PO TBEC: 40.0000 mg | DELAYED_RELEASE_TABLET | Freq: Every day | ORAL | Status: DC

## 2015-02-26 MED ORDER — LISINOPRIL 40 MG PO TABS: 40.0000 mg | ORAL_TABLET | Freq: Every day | ORAL | Status: DC

## 2015-02-26 MED ORDER — CITALOPRAM HYDROBROMIDE 20 MG PO TABS: 20.0000 mg | ORAL_TABLET | Freq: Every day | ORAL | Status: DC

## 2015-02-26 NOTE — Addendum Note (Signed)
Addended by: Jamelle Haring on: 02/26/2015 12:30 PM   Modules accepted: Orders

## 2015-02-26 NOTE — Progress Notes (Signed)
Subjective:    Patient ID: Shane Frye, male    DOB: 1965/12/24, 50 y.o.   MRN: [id number]  HPI 50 year old gentleman with bilateral upper extremity neuropathy. One month ago I increased his Neurontin and he now takes about 2000 mg a day. This is still under maximum dose but he has had no effect by history. He has had MRI of his cervical spine which shows no significant disc disease that might explain his neuropathy. I have previously attributed his neuropathy to diabetes, but he would like another opinion.  Patient Active Problem List   Diagnosis Date Noted  . Evaluate for OSA (obstructive sleep apnea) 09/07/2014  . Hypertension   . GERD (gastroesophageal reflux disease)   . Diabetes ([place])   . Insomnia   . Neuropathy ([place])   . Rotator cuff tear 01/30/2014  . Dyspnea on exertion 12/26/2013  . Smoker 12/26/2013  . PAF (paroxysmal atrial fibrillation) ([place]) 12/26/2013  . 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 STEMI (ST elevation myocardial infarction) ([place]) 11/04/2013  . HTN (hypertension) 11/04/2013  . DM (diabetes mellitus) ([place]) 11/04/2013  . HLD (hyperlipidemia) 11/04/2013  . Obesity 11/04/2013  . Cocaine abuse 11/04/2013  . H/O noncompliance with medical treatment, presenting hazards to health 11/04/2013  . CAD (coronary artery disease), native coronary artery 11/04/2013   Outpatient Encounter Prescriptions as of 02/26/2015  Medication Sig  . aspirin 81 MG tablet Take 81 mg by mouth daily.  Marland Kitchen atorvastatin (LIPITOR) 20 MG tablet TAKE ONE TABLET BY MOUTH ONCE DAILY  . carvedilol (COREG) 12.5 MG tablet Take 1 tablet (12.5 mg total) by mouth 2 (two) times daily.  . citalopram (CELEXA) 20 MG tablet Take 20 mg by mouth daily.  Marland Kitchen gabapentin (NEURONTIN) 300 MG capsule Take 2 PO Q AM, 1 PO in the afternoon and 2PO at [facility].  Marland Kitchen glucose blood test strip Use as instructed  . hydrochlorothiazide (HYDRODIURIL) 25 MG tablet Take 1 tablet (25 mg total) by mouth daily.  . insulin aspart protamine- aspart  (NOVOLOG MIX 70/30) (70-30) 100 UNIT/ML injection Inject 15 Units into the skin 3 (three) times daily.  Marland Kitchen lisinopril (PRINIVIL,ZESTRIL) 40 MG tablet Take 1 tablet (40 mg total) by mouth daily.  . metFORMIN (GLUCOPHAGE) 1000 MG tablet TAKE ONE TABLET BY MOUTH TWICE DAILY WITH A  MEAL  . pantoprazole (PROTONIX) 40 MG tablet Take 1 tablet (40 mg total) by mouth daily.   No facility-administered encounter medications on file as of 02/26/2015.      Review of Systems  Constitutional: Negative.   Respiratory: Negative.   Cardiovascular: Negative.   Neurological: Positive for numbness.  Psychiatric/Behavioral: Negative.        Objective:   Physical Exam  Constitutional: He is oriented to person, place, and time. He appears well-developed and well-nourished.  Cardiovascular: Normal rate, regular rhythm and normal heart sounds.   Pulmonary/Chest: Effort normal and breath sounds normal.  Neurological: He is alert and oriented to person, place, and time. He displays normal reflexes. He exhibits normal muscle tone.          Assessment & Plan:   1. Essential hypertension Pressures are well controlled on current regimen of lisinopril hydrochlorothiazide and carvedilol  2. 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 Hopefully we'll be able to make some inroads there. Continue with metformin and insulin  3. Neuropathy ([place]) We'll refer to neurology for second opinion but continue gabapentin at current dose for now.  Wardell Honour MD

## 2015-03-05 ENCOUNTER — Ambulatory Visit (INDEPENDENT_AMBULATORY_CARE_PROVIDER_SITE_OTHER): Payer: Medicaid Other | Admitting: Neurology

## 2015-03-05 ENCOUNTER — Encounter: Payer: Self-pay | Admitting: *Deleted

## 2015-03-05 ENCOUNTER — Other Ambulatory Visit: Payer: Self-pay | Admitting: Family Medicine

## 2015-03-05 ENCOUNTER — Encounter: Payer: Self-pay | Admitting: Neurology

## 2015-03-05 VITALS — BP 141/85 | HR 73 | Ht 69.0 in | Wt 264.2 lb

## 2015-03-05 DIAGNOSIS — G629 Polyneuropathy, unspecified: Secondary | ICD-10-CM

## 2015-03-05 DIAGNOSIS — E519 Thiamine deficiency, unspecified: Secondary | ICD-10-CM | POA: Diagnosis not present

## 2015-03-05 DIAGNOSIS — Z87898 Personal history of other specified conditions: Secondary | ICD-10-CM

## 2015-03-05 DIAGNOSIS — E538 Deficiency of other specified B group vitamins: Secondary | ICD-10-CM

## 2015-03-05 DIAGNOSIS — F1991 Other psychoactive substance use, unspecified, in remission: Secondary | ICD-10-CM

## 2015-03-05 DIAGNOSIS — F1921 Other psychoactive substance dependence, in remission: Secondary | ICD-10-CM

## 2015-03-05 MED ORDER — GABAPENTIN 300 MG PO CAPS: 900.0000 mg | ORAL_CAPSULE | Freq: Three times a day (TID) | ORAL | Status: DC

## 2015-03-05 NOTE — Progress Notes (Signed)
[id number]    Provider:  Dr Jaynee Eagles Referring Provider: Wardell Honour, MD Primary Care Physician:  Wardell Honour, MD  CC:  neuropathy  HPI:  Shane Frye is a 50 y.o. male here as a referral from Dr. Sabra Heck for neuropathy. He has a PMHx of neuropathy in the feet, depresison, insomnia, HTN, DM, COPD, HLD, anxiety, alcohol abuse, CAD, Obesity. He has burning and tingling in the hands which ir progressively worsening in the setting of uncontrolled DM. He gets up with it and goes to bed with it, continuous all day. Started over a year ago. It is progressively worsening. He can't button shirts, he drops everything, decreased fine motor in the hands. He denies ever having emg/ncs. He has been taking neurontin for 10 years for diabetic neuropathy in his feet which helped in the past for his feet but his hands still hurt. He used to have symptoms in the feet, numbness, tingling, burning. Now the feet are just numb. Diabetes is "up and down". Last HgbA1c is 10.6 01/2015, not well controlled. Gets cramps in the fingers, not in the feet. He has poor balance. The pain in the hands is a 10/10 all the time. In the setting of uncontrolled diabetes. He used to drink heavily for 30 years, quit a few years ago.   Reviewed notes, labs and imaging from outside physicians, which showed:  MR cervical cord 11/2014(personally reviewed images and agree with the following): The cervical cord is normal in size and signal. Vertebral body heights are maintained. The disc spaces are preserved. The cervical spine is normal in lordotic alignment. No static listhesis. Bone marrow signal is normal. Cerebellar tonsils are normal in position.  C2-3: No significant disc bulge. No definite neural foraminal stenosis. No central canal stenosis.  C3-4: Right foraminal disc protrusion. Right foraminal narrowing. No central canal stenosis.  C4-5: No significant disc bulge. No definite neural foraminal  stenosis. No central canal stenosis.  C5-6: Mild broad-based disc bulge. No definite neural foraminal stenosis. No central canal stenosis.  C6-7: No significant disc bulge. No definite neural foraminal stenosis. No central canal stenosis.  C7-T1: Mild broad-based disc bulge. No definite neural foraminal stenosis. No central canal stenosis.  IMPRESSION: 1. Significant patient motion degrading image quality limiting evaluation. 2. At C3-4 there is a right foraminal disc protrusion with right foraminal narrowing. 3. At C5-6 and C7-T1 there are mild broad-based disc bulges.   2D echo 2016: Study Conclusions - Left ventricle: The cavity size was normal. Wall thickness was normal. Systolic function was normal. The estimated ejection fraction was in the range of 55% to 60%. Wall motion was normal; there were no regional wall motion abnormalities.  HgbA1c 10.6, TSH 2.4,   Review of Systems: Patient complains of symptoms per HPI as well as the following symptoms: weight gain, swelling in legs, SOB, confusion, numbness, insomnia, sleepiness, RL, dizziness, depression, racing thoughts.. Pertinent negatives per HPI. All others negative.   Social History   Social History  . Marital Status: Married    Spouse Name: Inez Catalina   . Number of Children: 4  . Years of Education: 12   Occupational History  . Not on file.   Social History Main Topics  . Smoking status: Former Smoker -- 0.50 packs/day    Types: Cigarettes    Quit date: 11/10/2013  . Smokeless tobacco: Former Systems developer    Quit date: 11/04/2013  . Alcohol Use: No  . Drug Use: No  Comment: cocaine  . Sexual Activity: Yes   Other Topics Concern  . Not on file   Social History Narrative   Lives with wife and 2 children   Caffeine use: none    Family History  Problem Relation Age of Onset  . Hypertension Paternal Grandfather   . Hypertension Brother   . Diabetes Brother   . Neuropathy Neg Hx     Past Medical  History  Diagnosis Date  . Depression   . Insomnia   . Neuropathy ([place])   . Hypertension   . GERD (gastroesophageal reflux disease)   . Diabetes mellitus   . Myocardial infarction ([place]) 11/04/13  . COPD (chronic obstructive pulmonary disease) ([place])   . High cholesterol   . Anxiety     Past Surgical History  Procedure Laterality Date  . Cardioversion N/A 11/06/2013    Procedure: CARDIOVERSION;  Surgeon: Thayer Headings, MD;  Location: [facility];  Service: Cardiovascular;  Laterality: N/A;  . Coronary artery bypass graft N/A 11/09/2013    Procedure: CORONARY ARTERY BYPASS GRAFTING (CABG);  Surgeon: Melrose Nakayama, MD;  Location: [facility];  Service: Open Heart Surgery;  Laterality: N/A;  . Intraoperative transesophageal echocardiogram N/A 11/09/2013    Procedure: INTRAOPERATIVE TRANSESOPHAGEAL ECHOCARDIOGRAM;  Surgeon: Melrose Nakayama, MD;  Location: [facility];  Service: Open Heart Surgery;  Laterality: N/A;  . Left heart catheterization with coronary angiogram N/A 11/04/2013    Procedure: LEFT HEART CATHETERIZATION WITH CORONARY ANGIOGRAM;  Surgeon: Troy Sine, MD;  Location: [facility] CATH LAB;  Service: Cardiovascular;  Laterality: N/A;  . No past surgeries    . Colonoscopy N/A 05/11/2014    Procedure: COLONOSCOPY;  Surgeon: Rogene Houston, MD;  [facility];  Service: Endoscopy;  Laterality: N/A;  930    Current Outpatient Prescriptions  Medication Sig Dispense Refill  . aspirin 81 MG tablet Take 81 mg by mouth daily.    Marland Kitchen atorvastatin (LIPITOR) 20 MG tablet Take 1 tablet (20 mg total) by mouth daily. 90 tablet 0  . carvedilol (COREG) 12.5 MG tablet Take 1 tablet (12.5 mg total) by mouth 2 (two) times daily. 180 tablet 0  . citalopram (CELEXA) 20 MG tablet Take 1 tablet (20 mg total) by mouth daily. 90 tablet 0  . gabapentin (NEURONTIN) 300 MG capsule Take 3 capsules (900 mg total) by mouth 3 (three) times daily. Slowly increase as discussed. 810 capsule 3  . glucose blood  test strip Use as instructed 100 each 12  . hydrochlorothiazide (HYDRODIURIL) 25 MG tablet Take 1 tablet (25 mg total) by mouth daily. 90 tablet 0  . insulin aspart protamine- aspart (NOVOLOG MIX 70/30) (70-30) 100 UNIT/ML injection Inject 15 Units into the skin 3 (three) times daily.    Marland Kitchen lisinopril (PRINIVIL,ZESTRIL) 40 MG tablet Take 1 tablet (40 mg total) by mouth daily. 90 tablet 0  . metFORMIN (GLUCOPHAGE) 1000 MG tablet TAKE ONE TABLET BY MOUTH TWICE DAILY WITH A  MEAL 180 tablet 0  . pantoprazole (PROTONIX) 40 MG tablet Take 1 tablet (40 mg total) by mouth daily. 90 tablet 0   No current facility-administered medications for this visit.    Allergies as of 03/05/2015  . (No Known Allergies)    Vitals: BP 141/85 mmHg  Pulse 73  Ht 5\' 9"  (1.753 m)  Wt 264 lb 3.2 oz (119.84 kg)  BMI 39.00 kg/m2 Last Weight:  Wt Readings from Last 1 Encounters:  03/05/15 264 lb 3.2 oz (119.84 kg)  Last Height:   Ht Readings from Last 1 Encounters:  03/05/15 5\' 9"  (1.753 m)    Physical exam: Exam: Gen: NAD, obese                 CV: RRR, no MRG. No Carotid Bruits. No peripheral edema, warm, nontender Eyes: Conjunctivae clear without exudates or hemorrhage  Neuro: Detailed Neurologic Exam  Speech:    Speech is normal; fluent and spontaneous with normal comprehension.  Cognition:    The patient is oriented to person, place, and time;     recent and remote memory appears intact;     language fluent;     normal attention, concentration,     fund of knowledge normal for education level Cranial Nerves:    The pupils are equal, round, and reactive to light. The fundi are flat. Visual fields are full to finger confrontation. Extraocular movements are intact. Trigeminal sensation is intact and the muscles of mastication are normal. The face is symmetric, bilateral ptosis. The palate elevates in the midline. Hearing intact. Voice is normal. Shoulder shrug is normal. The tongue has normal  motion without fasciculations.   Coordination:    Normal finger to nose and heel to shin.  Gait:    Heel-toe intact.   Motor Observation:    No asymmetry, no atrophy, and no involuntary movements noted. Tone:    Normal muscle tone.    Posture:    Posture is normal. normal erect    Strength:    Strength is V/V in the upper and lower limbs.      Sensation: Decreased pp and temp to knees and mid forearms. Intact vibation and proprioception in the great toes.      Reflex Exam:  DTR's:    Deep tendon reflexes in the upper and lower extremities are brisk for age and medical conditions bilaterally, intact AJs.  Toes:    The toes are equivocal bilaterally.   Clonus:    Clonus is absent.      Assessment/Plan:  a 50 y.o. male here as a referral from Dr. Sabra Heck for neuropathy. He has a PMHx of neuropathy in the feet, depresison, insomnia, HTN, DM, COPD, HLD, anxiety, alcohol abuse. He has burning and tingling in the hands which ir progressively worsening in the setting of uncontrolled DM and past alcohol abuse. Exam significant for sensory changes moreso in small fibers of the distal extremities. 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   Will order a complete neuropathy serum screen emg/ncs Incease the neurontin to 300mg , 900mg  up to 3x a day  Sarina Ill, MD  [facility] [address] [place], [place] [address]  Phone [phone] Fax [phone]

## 2015-03-05 NOTE — Patient Instructions (Signed)
Remember to drink plenty of fluid, eat healthy meals and do not skip any meals. Try to eat protein with a every meal and eat a healthy snack such as fruit or nuts in between meals. Try to keep a regular sleep-wake schedule and try to exercise daily, particularly in the form of walking, 20-30 minutes a day, if you can.   As far as diagnostic testing: Labs, emg/ncs  Meds: alpha lipoic acid 400-600mg  day may help with diabetic neuropathy. Increase neurontin may take 300mg , 3tabs 3x a day increase slowly and watch for sedation, weight gain, swelling, dizziness, falls.  I would like to see you back for emg/ncs, sooner if we need to. Please call us with any interim questions, concerns, problems, updates or refill requests.   Our phone number is [phone]. We also have an after hours call service for urgent matters and there is a physician on-call for urgent questions. For any emergencies you know to call 911 or go to the nearest emergency room

## 2015-03-06 ENCOUNTER — Telehealth: Payer: Self-pay | Admitting: Family Medicine

## 2015-03-06 MED ORDER — INSULIN PEN NEEDLE 31G X 8 MM MISC
Status: DC
Start: 2015-03-06 — End: 2015-07-17

## 2015-03-06 MED ORDER — "INSULIN SYRINGE-NEEDLE U-100 31G X 5/16"" 0.3 ML MISC": 1.0000 | Freq: Three times a day (TID) | Status: DC

## 2015-03-06 NOTE — Telephone Encounter (Signed)
Stp and rx for syringes sent to pharmacy.

## 2015-03-06 NOTE — Telephone Encounter (Signed)
done

## 2015-03-07 ENCOUNTER — Ambulatory Visit (INDEPENDENT_AMBULATORY_CARE_PROVIDER_SITE_OTHER): Payer: Medicaid Other | Admitting: Neurology

## 2015-03-07 ENCOUNTER — Ambulatory Visit (INDEPENDENT_AMBULATORY_CARE_PROVIDER_SITE_OTHER): Payer: Self-pay | Admitting: Neurology

## 2015-03-07 DIAGNOSIS — E538 Deficiency of other specified B group vitamins: Secondary | ICD-10-CM

## 2015-03-07 DIAGNOSIS — Z87898 Personal history of other specified conditions: Secondary | ICD-10-CM

## 2015-03-07 DIAGNOSIS — G629 Polyneuropathy, unspecified: Secondary | ICD-10-CM

## 2015-03-07 DIAGNOSIS — Z0289 Encounter for other administrative examinations: Secondary | ICD-10-CM

## 2015-03-07 DIAGNOSIS — F1991 Other psychoactive substance use, unspecified, in remission: Secondary | ICD-10-CM

## 2015-03-07 DIAGNOSIS — E519 Thiamine deficiency, unspecified: Secondary | ICD-10-CM

## 2015-03-07 DIAGNOSIS — G5603 Carpal tunnel syndrome, bilateral upper limbs: Secondary | ICD-10-CM

## 2015-03-07 NOTE — Addendum Note (Signed)
Addended by: Sarina Ill B on: 03/07/2015 09:52 AM   Modules accepted: Orders

## 2015-03-07 NOTE — Progress Notes (Signed)
  [id number] [facility]    Provider:  Dr Jaynee Eagles Referring Provider: Wardell Honour, MD Primary Care Physician:  Wardell Honour, MD  HPI:  Shane Frye is a 50 y.o. male here as a referral from Dr. Sabra Heck for hand pain.  He has a PMHx of neuropathy in the feet, depresison, insomnia, HTN, DM, COPD, HLD, anxiety, remote alcohol abuse, CAD, Obesity, diabetes. He has burning and tingling in the hands which is progressively worsening. He gets up with it and goes to bed with it, continuous all day. Started over a year ago. It is progressively worsening. He can't button shirts, he drops everything, decreased fine motor in the hands. The pain in the hands is a 10/10 all the time. MRI of the cervical spine did not show nerve root impingement.   Summary:   Nerve Conduction Studies were performed on the bilateral upper and lower extremities.  The right median APB motor nerve showed prolonged distal onset latency (11.2 ms, N<4.0) with decreased amplitude(42mV, N>3). The right Median 2nd Digit sensory nerve showed no response F Wave studies indicate that the right Median F wave has delayed latency(47, N<19ms).  The left median APB motor nerve showed prolonged distal onset latency (10.1 ms, N<4.0). The left Median 2nd Digit sensory nerve showed no response F Wave studies indicate that the left Median F wave has delayed latency(46.6, N<38ms)  Bilateral Ulnar ADM motor nerves were within normal limits. F Wave studies indicate that the bilateral Ulnar F waves have normal latencies The bilateralUlnar 5th digit sensory nerves were within normal limits  The bilateral Peroneal motor nerves showed normal conductions with normal F Wave latencies The bilateral Tibial motor nerves showed normal conductions with normal F Wave latencies The bilateral Sural sensory nerve conductions were within normal limits Bilateral H Reflexes showed normal latencies . EMG needle study of selected bilateral upper  extremity muscles was performed. The bilateral Opponens Pollicis muscles showed increased spontaneous activity (+3 positive sharp waves), Increased motor unit amplitude, decreased motor unit recruitment. The following muscles were normal bilaterally: Deltoid, Triceps, Pronator Teres, First Dorsal Interosseous, Extensor Indicis.   Conclusion: This is an abnormal study. 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 No suggestion of radiculopathy. Clinical correlation recommended.   Sarina Ill, MD  [facility] [address] [place], [place] [address]  Phone [phone] Fax [phone]

## 2015-03-07 NOTE — Procedures (Signed)
  [id number]    Provider:  Dr Jaynee Eagles Referring Provider: Wardell Honour, MD Primary Care Physician:  Wardell Honour, MD  HPI:  Shane Frye is a 50 y.o. male here as a referral from Dr. Sabra Heck for hand pain.  He has a PMHx of neuropathy in the feet, depresison, insomnia, HTN, DM, COPD, HLD, anxiety, remote alcohol abuse, CAD, Obesity, diabetes. He has burning and tingling in the hands which is progressively worsening. He gets up with it and goes to bed with it, continuous all day. Started over a year ago. It is progressively worsening. He can't button shirts, he drops everything, decreased fine motor in the hands. The pain in the hands is a 10/10 all the time. MRI of the cervical spine did not show nerve root impingement.   Summary:   Nerve Conduction Studies were performed on the bilateral upper and lower extremities.  The right median APB motor nerve showed prolonged distal onset latency (11.2 ms, N<4.0) with decreased amplitude(22mV, N>3). The right Median 2nd Digit sensory nerve showed no response F Wave studies indicate that the right Median F wave has delayed latency(47, N<68ms).  The left median APB motor nerve showed prolonged distal onset latency (10.1 ms, N<4.0). The left Median 2nd Digit sensory nerve showed no response F Wave studies indicate that the left Median F wave has delayed latency(46.6, N<47ms)  Bilateral Ulnar ADM motor nerves were within normal limits. F Wave studies indicate that the bilateral Ulnar F waves have normal latencies The bilateralUlnar 5th digit sensory nerves were within normal limits  The bilateral Peroneal motor nerves showed normal conductions with normal F Wave latencies The bilateral Tibial motor nerves showed normal conductions with normal F Wave latencies The bilateral Sural sensory nerve conductions were within normal limits Bilateral H Reflexes showed normal latencies . EMG needle study of selected bilateral upper  extremity muscles was performed. The bilateral Opponens Pollicis muscles showed increased spontaneous activity (+3 positive sharp waves), Increased motor unit amplitude, decreased motor unit recruitment. The following muscles were normal bilaterally: Deltoid, Triceps, Pronator Teres, First Dorsal Interosseous, Extensor Indicis.   Conclusion: This is an abnormal study. 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 No suggestion of radiculopathy. Clinical correlation recommended.   Shane Ill, MD  [facility] [address] [place],  [address]  Phone [phone] Fax [phone]

## 2015-03-07 NOTE — Progress Notes (Signed)
See procedure note.

## 2015-03-08 LAB — COMPREHENSIVE METABOLIC PANEL
ALK PHOS: 84 IU/L (ref 39–117)
ALT: 9 IU/L (ref 0–44)
AST: 19 IU/L (ref 0–40)
Albumin/Globulin Ratio: 1.8 (ref 1.1–2.5)
Albumin: 4.2 g/dL (ref 3.5–5.5)
BUN/Creatinine Ratio: 26 — ABNORMAL HIGH (ref 9–20)
BUN: 18 mg/dL (ref 6–24)
Bilirubin Total: 0.7 mg/dL (ref 0.0–1.2)
CALCIUM: 9.1 mg/dL (ref 8.7–10.2)
CO2: 21 mmol/L (ref 18–29)
CREATININE: 0.7 mg/dL — AB (ref 0.76–1.27)
Chloride: 97 mmol/L (ref 96–106)
GFR calc Af Amer: 125 mL/min/{1.73_m2} (ref >59)
GFR calc non Af Amer: 109 mL/min/{1.73_m2} (ref >59)
GLOBULIN, TOTAL: 2.4 g/dL (ref 1.5–4.5)
Glucose: 239 mg/dL — ABNORMAL HIGH (ref 65–99)
Potassium: 5.1 mmol/L (ref 3.5–5.2)
SODIUM: 135 mmol/L (ref 134–144)
Total Protein: 6.6 g/dL (ref 6.0–8.5)

## 2015-03-08 LAB — MULTIPLE MYELOMA PANEL, SERUM
ALBUMIN SERPL ELPH-MCNC: 3.4 g/dL (ref 2.9–4.4)
Albumin/Glob SerPl: 1.1 (ref 0.7–1.7)
Alpha 1: 0.2 g/dL (ref 0.0–0.4)
Alpha2 Glob SerPl Elph-Mcnc: 0.7 g/dL (ref 0.4–1.0)
B-Globulin SerPl Elph-Mcnc: 1.2 g/dL (ref 0.7–1.3)
GAMMA GLOB SERPL ELPH-MCNC: 1.1 g/dL (ref 0.4–1.8)
Globulin, Total: 3.2 g/dL (ref 2.2–3.9)
IGA/IMMUNOGLOBULIN A, SERUM: 264 mg/dL (ref 90–386)
IGG (IMMUNOGLOBIN G), SERUM: 961 mg/dL (ref 700–1600)
IGM (IMMUNOGLOBULIN M), SRM: 33 mg/dL (ref 20–172)

## 2015-03-08 LAB — ANA W/REFLEX: Anti Nuclear Antibody(ANA): NEGATIVE

## 2015-03-08 LAB — RPR: RPR: NONREACTIVE

## 2015-03-08 LAB — HIV ANTIBODY (ROUTINE TESTING W REFLEX): HIV Screen 4th Generation wRfx: NONREACTIVE

## 2015-03-08 LAB — HEAVY METALS, BLOOD
ARSENIC: 4 ug/L (ref 2–23)
Lead, Blood: NOT DETECTED ug/dL (ref 0–19)
Mercury: NOT DETECTED ug/L (ref 0.0–14.9)

## 2015-03-08 LAB — METHYLMALONIC ACID, SERUM: METHYLMALONIC ACID: 186 nmol/L (ref 0–378)

## 2015-03-08 LAB — VITAMIN B1: THIAMINE: 152.3 nmol/L (ref 66.5–200.0)

## 2015-03-08 LAB — HEPATITIS C ANTIBODY: Hep C Virus Ab: <0.1 s/co ratio (ref 0.0–0.9)

## 2015-03-08 LAB — VITAMIN B6: Vitamin B6: 6.6 ug/L (ref 5.3–46.7)

## 2015-03-08 LAB — B12 AND FOLATE PANEL
FOLATE: 12.8 ng/mL (ref >3.0)
Vitamin B-12: 280 pg/mL (ref 211–946)

## 2015-03-08 LAB — B. BURGDORFI ANTIBODIES

## 2015-03-09 ENCOUNTER — Telehealth: Payer: Self-pay

## 2015-03-09 NOTE — Telephone Encounter (Signed)
-----   Message from Melvenia Beam, MD sent at 03/08/2015  5:42 PM EST ----- Labs all normal except high glucose (over 200) and a little dehydration thnaks

## 2015-03-11 ENCOUNTER — Ambulatory Visit (HOSPITAL_BASED_OUTPATIENT_CLINIC_OR_DEPARTMENT_OTHER): Payer: Medicaid Other | Attending: Cardiovascular Disease | Admitting: Radiology

## 2015-03-11 VITALS — Ht 69.0 in | Wt 264.0 lb

## 2015-03-11 DIAGNOSIS — Z79899 Other long term (current) drug therapy: Secondary | ICD-10-CM | POA: Diagnosis not present

## 2015-03-11 DIAGNOSIS — I493 Ventricular premature depolarization: Secondary | ICD-10-CM | POA: Diagnosis not present

## 2015-03-11 DIAGNOSIS — I472 Ventricular tachycardia: Secondary | ICD-10-CM | POA: Diagnosis not present

## 2015-03-11 DIAGNOSIS — Z7984 Long term (current) use of oral hypoglycemic drugs: Secondary | ICD-10-CM | POA: Insufficient documentation

## 2015-03-11 DIAGNOSIS — R0683 Snoring: Secondary | ICD-10-CM | POA: Insufficient documentation

## 2015-03-11 DIAGNOSIS — G4730 Sleep apnea, unspecified: Secondary | ICD-10-CM | POA: Diagnosis not present

## 2015-03-11 DIAGNOSIS — G4736 Sleep related hypoventilation in conditions classified elsewhere: Secondary | ICD-10-CM | POA: Insufficient documentation

## 2015-03-11 DIAGNOSIS — Z7982 Long term (current) use of aspirin: Secondary | ICD-10-CM | POA: Insufficient documentation

## 2015-03-11 DIAGNOSIS — G4733 Obstructive sleep apnea (adult) (pediatric): Secondary | ICD-10-CM | POA: Insufficient documentation

## 2015-03-11 DIAGNOSIS — Z794 Long term (current) use of insulin: Secondary | ICD-10-CM | POA: Diagnosis not present

## 2015-03-12 ENCOUNTER — Ambulatory Visit: Payer: Medicaid Other | Admitting: Family Medicine

## 2015-03-16 ENCOUNTER — Telehealth: Payer: Self-pay | Admitting: Family Medicine

## 2015-03-16 NOTE — Telephone Encounter (Signed)
All meds were done earlier in month, pt aware

## 2015-03-19 ENCOUNTER — Telehealth: Payer: Self-pay | Admitting: Neurology

## 2015-03-19 NOTE — Telephone Encounter (Signed)
Called and spoke to [place] in referrals. She transferred me to [facility] who handles Dr Levell July referrals. I was on hold for a couple minutes and then got hung up on. I called back and spoke to [facility]. Asked on status of referral. She did not know. Stated Dr still had referral and was reviewing it. Asked how long a typical referral is reviewed. She could not give me an answer. She stated to call back Wednesday to check on referral. I asked if she could check with the doctor and call the pt on their end. Advised pt has called our office frustrated since he has not heard from their office. She stated she will speak to doctor and call pt to let him know.

## 2015-03-19 NOTE — Telephone Encounter (Signed)
Spoke to pt. Relayed information below and told him to call us back if he hasnt heard from the referral office this week.

## 2015-03-19 NOTE — Telephone Encounter (Signed)
Spoke to Rance Muir- Teaching laboratory technician. There are no hand surgeons to refer to in [place], [place]

## 2015-03-19 NOTE — Telephone Encounter (Signed)
Called Dr Fredna Dow office and spoke to Shirlean Mylar to check on referral to [place]. Left detailed VM to have her call me back. Gave [facility] phone number.

## 2015-03-19 NOTE — Telephone Encounter (Signed)
Called pt back. He states it has been 2 weeks today since referral sent. He is in a lot of pain and would a referral somewhere else, possibly in [place], [place]. He does NOT want to be referred to Dr Case in [place], [place]. Advised I will speak to Dr Jaynee Eagles and call him back to advise. He understands.  I suggested calling Dr Levell July office again to check on referral status.

## 2015-03-19 NOTE — Telephone Encounter (Signed)
Patient called to advise he called Dr. Levell July office regarding referral for hand surgery, was told Dr. Fredna Dow has to look at the diagnosis before they can schedule anything. Patient requests being referred to another Doctor for this, possibly in [place]. Patient would like to speak to a nurse.

## 2015-03-20 NOTE — Sleep Study (Signed)
Patient Name: Shane Frye, Shane Frye Date: 03/11/2015 Gender: Male D.O.B: [birth year] Age (years): 26 Referring Provider: Shelva Majestic MD, ABSM Height (inches): 69 Interpreting Physician: Shelva Majestic MD, ABSM Weight (lbs): 264 RPSGT: Zadie Rhine BMI: 73 MRN: [id number] Neck Size: 20.00  CLINICAL INFORMATION Sleep Study Type: NPSG Indication for sleep study: OSA Epworth Sleepiness Score: 9  SLEEP STUDY TECHNIQUE As per the AASM Manual for the Scoring of Sleep and Associated Events v2.3 (April 2016) with a hypopnea requiring 4% desaturations. The channels recorded and monitored were frontal, central and occipital EEG, electrooculogram (EOG), submentalis EMG (chin), nasal and oral airflow, thoracic and abdominal wall motion, anterior tibialis EMG, snore microphone, electrocardiogram, and pulse oximetry.  MEDICATIONS  aspirin 81 MG tablet 81 mg, Daily     atorvastatin (LIPITOR) 20 MG tablet 20 mg, Daily     carvedilol (COREG) 12.5 MG tablet 12.5 mg, 2 times daily     citalopram (CELEXA) 20 MG tablet 20 mg, Daily     gabapentin (NEURONTIN) 300 MG capsule 900 mg, 3 times daily     glucose blood test strip      hydrochlorothiazide (HYDRODIURIL) 25 MG tablet 25 mg, Daily     insulin aspart protamine- aspart (NOVOLOG MIX 70/30) (70-30) 100 UNIT/ML injection 15 Units, 3 times daily    Insulin Pen Needle (RELION PEN NEEDLE 31G/8MM) 31G X 8 MM MISC      Insulin Syringe-Needle U-100 (RELION INSULIN SYR 0.3ML/31G) 31G X 5/16" 0.3 ML MISC 1 Syringe, 3 times daily     lisinopril (PRINIVIL,ZESTRIL) 40 MG tablet 40 mg, Daily     metFORMIN (GLUCOPHAGE) 1000 MG tablet      pantoprazole (PROTONIX) 40 MG tablet 40 mg, Daily    Medications self-administered by patient during sleep study : No sleep medicine administered.  SLEEP ARCHITECTURE The study was initiated at 9:16:09 PM and ended at 3:08:24 AM. Sleep onset time was 111.0 minutes and the sleep efficiency was 17.0%. The total sleep  time was 60.0 minutes. Wake after sleep  onset (WASO): 181.2 minutes Stage REM latency was N/A minutes. The patient spent 15.00% of the night in stage N1 sleep, 85.00% in stage N2 sleep, 0.00% in stage N3 and 0.00% in REM. Alpha intrusion was absent. Supine sleep was 92.50%.  RESPIRATORY PARAMETERS The overall apnea/hypopnea index (AHI) was 93.0 per hour. There were 36 total apneas, including 35 obstructive, 1 central and 0 mixed apneas. There were 57 hypopneas and 3 RERAs. The AHI during Stage REM sleep was N/A per hour. AHI while supine was 97.3 per hour. The mean oxygen saturation was 88.84%. The minimum SpO2 during sleep was 73.00%. Loud snoring was noted during this study.  CARDIAC DATA The 2 lead EKG demonstrated sinus rhythm. The mean heart rate was 61.49 beats per minute. Other EKG findings include: PVCs.  LEG MOVEMENT DATA The total PLMS were 0 with a resulting PLMS index of 0.00. Associated arousal with leg movement index was 0.0 .  IMPRESSIONS - Severe obstructive sleep apnea occurred during this study (AHI = 93.0/h). - No significant central sleep apnea occurred during this study (CAI = 1.0/h). - Abnormal sleep architure with absence of slow wave and REM sleep. - Significant oxygen desaturation to a nadir of 73.00%. - The patient snored with Loud snoring volume. - The arousal index was abnormal. - EKG findings include PVCs and a 4 beat run of NSVT. - Clinically significant periodic limb movements did not occur during sleep. No significant associated arousals.  DIAGNOSIS - Obstructive Sleep Apnea (327.23 [G47.33 ICD-10]) - Nocturnal Hypoxemia (327.26 [G47.36 ICD-10])  RECOMMENDATIONS - Expeditous CPAP titration to determine optimal pressure required to alleviate severe sleep disordered breathing. - Positional therapy avoiding supine position during sleep. - Avoid alcohol, sedatives and other CNS depressants that may worsen sleep apnea and disrupt normal sleep  architecture. - Sleep hygiene should be reviewed to assess factors that may improve sleep quality. - Weight management and regular exercise should be initiated or continued if appropriate.   Troy Sine, MD, [place], American Board of Sleep Medicine  ELECTRONICALLY SIGNED ON:  03/20/2015, 7:19 PM [place] PH: [phone]   FX: [phone] [place]

## 2015-03-22 ENCOUNTER — Telehealth: Payer: Self-pay | Admitting: *Deleted

## 2015-03-22 ENCOUNTER — Other Ambulatory Visit: Payer: Self-pay | Admitting: *Deleted

## 2015-03-22 DIAGNOSIS — G4733 Obstructive sleep apnea (adult) (pediatric): Secondary | ICD-10-CM

## 2015-03-22 NOTE — Telephone Encounter (Signed)
I spoke to the PA who said he would take care it personally. But there nothing else we can do. If they don't call him or schedule an appointment by Monday he will have to go someplace else. thanks

## 2015-03-22 NOTE — Telephone Encounter (Signed)
Called pt and relayed that Dr Jaynee Eagles contacted Dr Roselee Nova office and spoke to [place], [place]. Shane Frye is checking on status of referral and as soon as our office knows more, we will contact him and let him know. He verbalized understanding and appreciation.

## 2015-03-22 NOTE — Telephone Encounter (Signed)
LVM for Robin to call back regarding referral that was sent. Gave [facility] phone number for her to call back.

## 2015-03-22 NOTE — Telephone Encounter (Signed)
Pt called said he hasn't heard anything from Dr Levell July office. Please call him at [phone]

## 2015-03-22 NOTE — Progress Notes (Signed)
Patient notified of sleep study results ans recommendations.

## 2015-03-22 NOTE — Telephone Encounter (Signed)
Called Dr Atilano Median office again. Dr Jaynee Eagles spoke to [place], [place] about situation. She wants to know status of referral. Herbie [place] stated he will handle situation personally and let us know.

## 2015-03-22 NOTE — Telephone Encounter (Signed)
-----   Message from Troy Sine, MD sent at 03/20/2015  7:29 PM EST ----- Please schedule for CPAP ASAP

## 2015-03-23 ENCOUNTER — Other Ambulatory Visit: Payer: Self-pay | Admitting: Neurology

## 2015-03-23 ENCOUNTER — Telehealth: Payer: Self-pay | Admitting: Neurology

## 2015-03-23 ENCOUNTER — Telehealth: Payer: Self-pay | Admitting: Family Medicine

## 2015-03-23 DIAGNOSIS — G5603 Carpal tunnel syndrome, bilateral upper limbs: Secondary | ICD-10-CM

## 2015-03-23 MED ORDER — HYDROCODONE-ACETAMINOPHEN 10-325 MG PO TABS: 1.0000 | ORAL_TABLET | Freq: Three times a day (TID) | ORAL | Status: DC | PRN

## 2015-03-23 NOTE — Telephone Encounter (Signed)
Apparently, EMGs showed carpal tunnel syndrome and he is awaiting surgery. He is on high-dose gabapentin without relief we can try hydrocodone but this is just a temporizing measure and that needs to be emphasized.

## 2015-03-23 NOTE — Telephone Encounter (Signed)
Pt aware written rx is at front desk ready for pickup 

## 2015-03-23 NOTE — Telephone Encounter (Signed)
Hinton Dyer , please refer to [facility] for CTS and include my emg/ncs results please

## 2015-03-23 NOTE — Telephone Encounter (Signed)
Pt called said while he is waiting for referral to be scheduled he needs something for the pain, he sts it is unbearable and neurontin is not helping.

## 2015-03-23 NOTE — Telephone Encounter (Signed)
Called pt back. Per Dr Jaynee Eagles, she does not do pain management. Advised we are placing a different referral to [facility], which is a different office than Dr Roselee Nova. Apologized for the inconvenience. Told him to call with any further questions.    Faxed referral to [facility] to hand surgery. Received confirmation. Fax: [phone].

## 2015-03-26 ENCOUNTER — Telehealth: Payer: Self-pay | Admitting: Family Medicine

## 2015-03-26 ENCOUNTER — Telehealth: Payer: Self-pay | Admitting: *Deleted

## 2015-03-26 NOTE — Telephone Encounter (Signed)
Called pt and relayed that I received message back from [facility] that referral needs to come from his PCP d/t his insurance. He verbalized understanding and is going to call PCP to get referral placed.

## 2015-03-27 ENCOUNTER — Encounter (HOSPITAL_BASED_OUTPATIENT_CLINIC_OR_DEPARTMENT_OTHER): Payer: Medicaid Other

## 2015-03-27 ENCOUNTER — Encounter: Payer: Self-pay | Admitting: Cardiovascular Disease

## 2015-03-27 ENCOUNTER — Telehealth: Payer: Self-pay

## 2015-03-27 ENCOUNTER — Ambulatory Visit (INDEPENDENT_AMBULATORY_CARE_PROVIDER_SITE_OTHER): Payer: Medicaid Other | Admitting: Cardiovascular Disease

## 2015-03-27 VITALS — BP 136/82 | HR 64 | Ht 69.0 in | Wt 262.0 lb

## 2015-03-27 DIAGNOSIS — E669 Obesity, unspecified: Secondary | ICD-10-CM

## 2015-03-27 DIAGNOSIS — G4733 Obstructive sleep apnea (adult) (pediatric): Secondary | ICD-10-CM | POA: Diagnosis not present

## 2015-03-27 DIAGNOSIS — Z951 Presence of aortocoronary bypass graft: Secondary | ICD-10-CM

## 2015-03-27 DIAGNOSIS — I10 Essential (primary) hypertension: Secondary | ICD-10-CM | POA: Diagnosis not present

## 2015-03-27 DIAGNOSIS — G5600 Carpal tunnel syndrome, unspecified upper limb: Secondary | ICD-10-CM

## 2015-03-27 NOTE — Telephone Encounter (Signed)
Referral placed and pt aware of order

## 2015-03-27 NOTE — Telephone Encounter (Signed)
[facility] [phone] called to advise, they received yet another referral request and has previously advised, they cannot accept referrals from other specialist if patient has medicaid, referral will need to come from patient's PCP.

## 2015-03-27 NOTE — Telephone Encounter (Signed)
Called and spoke to [place] at [facility] relaying PCP had to do referral for Orthopedics Carpal Tunnel . [id number].

## 2015-03-27 NOTE — Patient Instructions (Signed)
Your physician recommends that you schedule a follow-up appointment in: [facility] sleep [facility] with dr Claiborne Billings.  Sleep appointment is March 29th @ 8:00. [facility].

## 2015-03-27 NOTE — Telephone Encounter (Signed)
Faxed order , Notes, NCV/EMG and insurance  to [facility] telephone number [phone] fax [phone].

## 2015-03-27 NOTE — Telephone Encounter (Signed)
[facility] said patient needs to be referred to [facility] for carpal tunnel

## 2015-03-27 NOTE — Telephone Encounter (Signed)
Called patient and spoke to him he is aware of status and he will be looking out for [place] telephone call.

## 2015-03-28 ENCOUNTER — Telehealth: Payer: Self-pay | Admitting: Family Medicine

## 2015-03-28 NOTE — Telephone Encounter (Signed)
Patient aware referral has been placed and he will get a call about his appointment.

## 2015-03-29 ENCOUNTER — Encounter: Payer: Self-pay | Admitting: Cardiovascular Disease

## 2015-03-29 NOTE — Progress Notes (Signed)
Patient ID: Shane Frye, male   DOB: [birth year], 50 y.o.   MRN: [id number]    Primary MD: Dr. Alain Honey  HPI: Shane Frye is a 50 y.o. male who presents to the office today for a 7 month follow up cardiology evaluation.  [name] has a history of hypertension, type 2 diabetes mellitus, obesity, strong family history for premature CAD, and medical noncompliance.  On 11/04/2013 he presented to [facility] hospital in transfer from [facility] with possible  ST segment elevation anterior myocardial infarction.  He had taken cocaine the day before.  I performed emergent cardiac catheterization which revealed severe three-vessel CAD with evidence for coronary calcification.  There was an 80% eccentric hazy proximal LAD stenosis followed by 80% stenosis of the first diagonal branch, 80% mid LAD stenosis, 50% proximal circumflex stenosis with an 80% OM1 stenosis and 90% OM 2 stenosis and a dominant right coronary artery with 95% mid PDA stenosis and 90% distal RCA PLA stenoses.  Intervention was not performed and he was evaluated by Dr. Roxan Hockey and underwent CABG surgery 6 on 11/10/2013.  He developed transient PAF postoperatively and ultimately was discharged on amiodarone 400 mg twice a day.  He was seen by Shane Frye on 12/26/2013 and was maintaining sinus rhythm.  At that time, with his diabetes mellitus, hypertension, and edema, he was started on Prinzide.  When I saw him ininitially in follow-up he  denied any recurrent episodes of chest pain.  He had been taking amiodarone at 200 mg daily in addition to carvedilol 3.125 twice a day and was on lisinopril/HCTZ 10/12.5 daily, and recently he has been taking 2 tablets by mouth daily.  He is diabetic and has been taking metformin 1000 mg twice a day.  He apparently has been taking gemfibrozil 600 mg twice a day for hyperlipidemia.  At that time, I recommended he discontinue gemfibrozil and started him on atorvastatin 20 mg daily for more  aggressive lipid-lowering. On f/u laboratory cholesterol was 148, triglycerides 187 HDL 53, and LDL 58.  On his atorvastatin treatment.  On urinalysis , he had evidence for microalbuminuria at 36 g per mL.  A follow-up echo Doppler study 12/16/2014 showed an ejection fraction of 55-60%.  He had normal wall motion.  Since I last saw him 4 months ago, he denies any recurrent chest pain.  He continues to experience shortness of breath with walking.  He is unaware of any palpitations.  He does admit to some paresthesias of his hands and he has been on gabapentin by Dr. Sabra Heck.  He has been taking lisinopril 40 mg in the morning and HCTZ 25 mg as well as carvedilol 12.5 twice a day for his blood pressure.  He continues to be on aspirin at 81 mg.  He is tolerating Lipitor 20 mg.  LDL cholesterol in January was 58.  Triglycerides are mildly elevated at 187.  His diabetes has not been well controlled.  Recent blood work in June 2016 showed hemoglobin A1c of 8.8.   When I last saw him  Is concerned about symptoms highly suggestive of obstructive sleep apnea.  He underwent a sleep study which was done on 03/10/1998 17.  This confirmed my suspicion and revealed severe obstructive sleep apnea with an AHI of 93 per hour.  He had significant oxygen desaturation to a nadir of 73%.  There was absence of slow wave in REM sleep. Expeditious CPAP titration was recommended.  Past Medical History  Diagnosis Date  .  Depression   . Insomnia   . Neuropathy ([place])   . Hypertension   . GERD (gastroesophageal reflux disease)   . Diabetes mellitus   . Myocardial infarction ([place]) 11/04/13  . COPD (chronic obstructive pulmonary disease) ([place])   . High cholesterol   . Anxiety     Past Surgical History  Procedure Laterality Date  . Cardioversion N/A 11/06/2013    Procedure: CARDIOVERSION;  Surgeon: Thayer Headings, MD;  Location: [place];  Service: Cardiovascular;  Laterality: N/A;  . Coronary artery bypass graft N/A 11/09/2013      Procedure: CORONARY ARTERY BYPASS GRAFTING (CABG);  Surgeon: Melrose Nakayama, MD;  Location: [place];  Service: Open Heart Surgery;  Laterality: N/A;  . Intraoperative transesophageal echocardiogram N/A 11/09/2013    Procedure: INTRAOPERATIVE TRANSESOPHAGEAL ECHOCARDIOGRAM;  Surgeon: Melrose Nakayama, MD;  Location: [place];  Service: Open Heart Surgery;  Laterality: N/A;  . Left heart catheterization with coronary angiogram N/A 11/04/2013    Procedure: LEFT HEART CATHETERIZATION WITH CORONARY ANGIOGRAM;  Surgeon: Troy Sine, MD;  Location: [facility] CATH LAB;  Service: Cardiovascular;  Laterality: N/A;  . No past surgeries    . Colonoscopy N/A 05/11/2014    Procedure: COLONOSCOPY;  Surgeon: Rogene Houston, MD;  [facility];  Service: Endoscopy;  Laterality: N/A;  930    No Known Allergies  Current Outpatient Prescriptions  Medication Sig Dispense Refill  . aspirin 81 MG tablet Take 81 mg by mouth daily.    Marland Kitchen atorvastatin (LIPITOR) 20 MG tablet Take 1 tablet (20 mg total) by mouth daily. 90 tablet 0  . carvedilol (COREG) 12.5 MG tablet Take 1 tablet (12.5 mg total) by mouth 2 (two) times daily. 180 tablet 0  . citalopram (CELEXA) 20 MG tablet Take 1 tablet (20 mg total) by mouth daily. 90 tablet 0  . gabapentin (NEURONTIN) 300 MG capsule Take 3 capsules (900 mg total) by mouth 3 (three) times daily. Slowly increase as discussed. 810 capsule 3  . glucose blood test strip Use as instructed 100 each 12  . hydrochlorothiazide (HYDRODIURIL) 25 MG tablet Take 1 tablet (25 mg total) by mouth daily. 90 tablet 0  . HYDROcodone-acetaminophen (NORCO) 10-325 MG tablet Take 1 tablet by mouth every 8 (eight) hours as needed. 30 tablet 0  . insulin aspart protamine- aspart (NOVOLOG MIX 70/30) (70-30) 100 UNIT/ML injection Inject 15 Units into the skin 3 (three) times daily.    . Insulin Pen Needle (RELION PEN NEEDLE 31G/8MM) 31G X 8 MM MISC Use for insulin tid. Dx E09.59 100 each 5  .  Insulin Syringe-Needle U-100 (RELION INSULIN SYR 0.3ML/31G) 31G X 5/16" 0.3 ML MISC 1 Syringe by Does not apply route 3 (three) times daily. 300 each 2  . lisinopril (PRINIVIL,ZESTRIL) 40 MG tablet Take 1 tablet (40 mg total) by mouth daily. 90 tablet 0  . metFORMIN (GLUCOPHAGE) 1000 MG tablet TAKE ONE TABLET BY MOUTH TWICE DAILY WITH A  MEAL 180 tablet 0  . pantoprazole (PROTONIX) 40 MG tablet Take 1 tablet (40 mg total) by mouth daily. 90 tablet 0   No current facility-administered medications for this visit.    Social History   Social History  . Marital Status: Married    Spouse Name: Inez Catalina   . Number of Children: 4  . Years of Education: 12   Occupational History  . Not on file.   Social History Main Topics  . Smoking status: Former Smoker -- 0.50 packs/day  Types: Cigarettes    Quit date: 11/10/2013  . Smokeless tobacco: Former Systems developer    Quit date: 11/04/2013  . Alcohol Use: No  . Drug Use: No     Comment: cocaine  . Sexual Activity: Yes   Other Topics Concern  . Not on file   Social History Narrative   Lives with wife and 2 children   Caffeine use: none    Family History  Problem Relation Age of Onset  . Hypertension Paternal Grandfather   . Hypertension Brother   . Diabetes Brother   . Neuropathy Neg Hx     ROS General: Negative; No fevers, chills, or night sweats HEENT: Negative; No changes in vision or hearing, sinus congestion, difficulty swallowing Pulmonary:  He was [facility] at [facility] in October for bronchitis. Cardiovascular: See HPI: No chest pain, presyncope, syncope, palpatations GI: Negative; No nausea, vomiting, diarrhea, or abdominal pain GU: Negative; No dysuria, hematuria, or difficulty voiding Musculoskeletal: Negative; no myalgias, joint pain, or weakness Hematologic: Negative; no easy bruising, bleeding Endocrine: Negative; no heat/cold intolerance; no diabetes, Neuro: Negative; no changes in balance, headaches Skin:  Negative; No rashes or skin lesions Psychiatric: Negative; No behavioral problems, depression Sleep: Positive for  Severe OSA.  Recently confirmed on sleep study ; CPAP titration not yet done.  Other comprehensive 14 point system review is negative   Physical Exam BP 136/82 mmHg  Pulse 64  Ht 5\' 9"  (1.753 m)  Wt 262 lb (118.842 kg)  BMI 38.67 kg/m2  Wt Readings from Last 3 Encounters:  03/27/15 262 lb (118.842 kg)  03/11/15 264 lb (119.75 kg)  03/05/15 264 lb 3.2 oz (119.84 kg)   General: Alert, oriented, no distress.  Skin: normal turgor, no rashes, warm and dry HEENT: Normocephalic, atraumatic. Pupils equal round and reactive to light; sclera anicteric; extraocular muscles intact, No lid lag; Nose without nasal septal hypertrophy; Mouth/Parynx benign; Mallinpatti scale 3 Neck: No JVD, no carotid bruits; normal carotid upstroke Lungs: clear to ausculatation and percussion bilaterally; no wheezing or rales, normal inspiratory and expiratory effort Chest wall: without tenderness to palpitation Heart: PMI not displaced, RRR, s1 s2 normal, 1/6 systolic murmur, No diastolic murmur, no rubs, gallops, thrills, or heaves Abdomen: soft, nontender; no hepatosplenomehaly, BS+; abdominal aorta nontender and not dilated by palpation. Back: no CVA tenderness Pulses: 2+  Musculoskeletal:  Positive for bilateral carpal tunnel  symptoms Extremities: Pulses 2+, no clubbing cyanosis or edema, Homan's sign negative  Neurologic: grossly nonfocal; Cranial nerves grossly wnl Psychologic: Normal mood and affect  ECG (independently read by me):  Normal sinus rhythm at 64 bpm.  Small Q wave in lead 3..  Normal intervals  July 2016 ECG (independently read by me): Normal sinus rhythm at 76 bpm.  Probably progression anteriorly.  No significant ST-T changes.  March 2016 ECG (independently read by me): Normal sinus rhythm at 70 bpm.  Mild T wave changes anterolaterally.  Q wave in lead 3.  Normal  intervals.  ECG (independently read by me): Sinus bradycardia 57 bpm.  PR interval 186 ms.  Poor R wave progression.  LABS:  BMP Latest Ref Rng 03/05/2015 06/08/2014 05/23/2014  Glucose 65 - 99 mg/dL 239(H) 180(H) 268(H)  BUN 6 - 24 mg/dL 18 10 25(H)  Creatinine 0.76 - 1.27 mg/dL 0.70(L) 0.70 0.98  BUN/Creat Ratio 9 - 20 26(H) - -  Sodium 134 - 144 mmol/L 135 134(L) 135  Potassium 3.5 - 5.2 mmol/L 5.1 4.4 4.8  Chloride 96 - 106  mmol/L 97 99 100  CO2 18 - 29 mmol/L 21 28 25   Calcium 8.7 - 10.2 mg/dL 9.1 8.4 8.9      Component Value Date/Time   PROT 6.6 03/05/2015 1132   PROT 6.3 11/08/2013 2135   ALBUMIN 4.2 03/05/2015 1132   ALBUMIN 3.1* 11/08/2013 2135   AST 19 03/05/2015 1132   ALT 9 03/05/2015 1132   ALKPHOS 84 03/05/2015 1132   BILITOT 0.7 03/05/2015 1132   BILITOT 0.5 11/08/2013 2135    CBC Latest Ref Rng 06/08/2014 03/02/2014 11/14/2013  WBC 4.0 - 10.5 K/uL 10.0 11.5(A) 8.4  Hemoglobin 13.0 - 17.0 g/dL 13.5 13.5(A) 11.2(L)  Hematocrit 39.0 - 52.0 % 42.2 44.4 34.2(L)  Platelets 150 - 400 K/uL 243 - 321   Lab Results  Component Value Date   MCV 82.4 06/08/2014   MCV 78.4* 03/02/2014   MCV 86.8 11/14/2013   Lab Results  Component Value Date   TSH 2.400 08/07/2014   Lab Results  Component Value Date   HGBA1C 10.6 01/25/2015    BNP No results found for: PROBNP  Lipid Panel     Component Value Date/Time   CHOL 134 01/25/2015 0845   CHOL 149 11/06/2013 0300   TRIG 199* 01/25/2015 0845   HDL 34* 01/25/2015 0845   HDL 44 11/06/2013 0300   CHOLHDL 3.9 01/25/2015 0845   CHOLHDL 3.4 11/06/2013 0300   VLDL 39 11/06/2013 0300   LDLCALC 60 01/25/2015 0845   LDLCALC 66 11/06/2013 0300     RADIOLOGY: No results found.    ASSESSMENT AND PLAN: Shane Frye is a 50 year old gentleman with a cardiac risk factor profile notable for hypertension, diabetes mellitus, prior tobacco use, as well as previous use of cocaine. He was found to have severe three-vessel  coronary obstructive disease at urgent cardiac catheterization and was treated with CABG surgery 6 in September 2015.  He  Continues to be  in sinus rhythm and is without recurrent atrial fibrillation being off amiodarone.  He is not having any anginal symptomatology.  His blood pressure today is stable on his lisinopril 40 mg, HCTZ 25 mg and carvedilol  12.5 mg twice a day.  He is tolerating atorvastatin for hyperlipidemia with LDL cholesterol at target.  However, triglycerides remain mildly elevated.  His last hemoglobin A1c was increased at 8.8  And he is now on metformin 1000 mg twice a day, insulin for his diabetes mellitus. I reviewed his sleep study which confirmed my suspicion for severe obstructive sleep apnea.  He had significant nocturnal oxygen desaturation.  I call the lab today to see if in fact his CPAP titration could be expedited.  Apparently there was a cancellation for this evening, but the patient has a prior commitment and cannot have it done this evening.  We will still try to arrange this to be sooner than his scheduled CPAP appointment next month. Once CPAP is initiated, I will see him in a sleep clinic for further evaluation.  Time spent: 25 minutes   Troy Sine, MD, [facility]  03/29/2015 12:20 PM

## 2015-03-30 ENCOUNTER — Ambulatory Visit (HOSPITAL_BASED_OUTPATIENT_CLINIC_OR_DEPARTMENT_OTHER): Payer: Medicaid Other | Attending: Cardiovascular Disease

## 2015-03-30 DIAGNOSIS — Z7984 Long term (current) use of oral hypoglycemic drugs: Secondary | ICD-10-CM | POA: Insufficient documentation

## 2015-03-30 DIAGNOSIS — Z794 Long term (current) use of insulin: Secondary | ICD-10-CM | POA: Insufficient documentation

## 2015-03-30 DIAGNOSIS — Z7982 Long term (current) use of aspirin: Secondary | ICD-10-CM | POA: Diagnosis not present

## 2015-03-30 DIAGNOSIS — G4733 Obstructive sleep apnea (adult) (pediatric): Secondary | ICD-10-CM | POA: Insufficient documentation

## 2015-03-30 DIAGNOSIS — G4730 Sleep apnea, unspecified: Secondary | ICD-10-CM | POA: Diagnosis present

## 2015-03-30 DIAGNOSIS — Z79899 Other long term (current) drug therapy: Secondary | ICD-10-CM | POA: Insufficient documentation

## 2015-04-02 NOTE — Telephone Encounter (Signed)
Spoke to [place] and they are waiting on doctor to give approval before they can schedule patient . Called patient and left message for patient relaying the message. Relayed to patient if any questions please call me back.

## 2015-04-02 NOTE — Telephone Encounter (Signed)
Patient called back and stated he has not received a call from [facility], [place].  He is going to call them today about his appointment.

## 2015-04-10 ENCOUNTER — Other Ambulatory Visit: Payer: Self-pay | Admitting: Family Medicine

## 2015-04-10 MED ORDER — HYDROCODONE-ACETAMINOPHEN 10-325 MG PO TABS: 1.0000 | ORAL_TABLET | Freq: Three times a day (TID) | ORAL | Status: DC | PRN

## 2015-04-10 NOTE — Telephone Encounter (Signed)
Patient wanting a refill on hydrocodone.  Last refilled 02/03 and last seen 01/09

## 2015-04-10 NOTE — Addendum Note (Signed)
Addended by: Wardell Honour on: 04/10/2015 04:49 PM   Modules accepted: Orders

## 2015-04-10 NOTE — Telephone Encounter (Signed)
Please send a referral to orthopedics as patient requests

## 2015-04-11 NOTE — Telephone Encounter (Signed)
Pt aware rx ready to be picked up, rx placed up front

## 2015-04-15 NOTE — Sleep Study (Signed)
Patient Name: Shane Frye, Shane Frye Date: 03/30/2015 Gender: Male D.O.B: Nov 18, 1965 Age (years): 64 Referring Provider: Shelva Majestic MD, ABSM Height (inches): 69 Interpreting Physician: Shelva Majestic MD, ABSM Weight (lbs): 264 RPSGT: Joni Reining BMI: 60 MRN: [id number] Neck Size: 20.00  CLINICAL INFORMATION The patient is referred for a CPAP titration to treat severe sleep apnea.   Date of NPSG, Split Night or HST: 03/11/2015 (AHI 93/hr and O2 sat nadir 73%)  SLEEP STUDY TECHNIQUE As per the AASM Manual for the Scoring of Sleep and Associated Events v2.3 (April 2016) with a hypopnea requiring 4% desaturations. The channels recorded and monitored were frontal, central and occipital EEG, electrooculogram (EOG), submentalis EMG (chin), nasal and oral airflow, thoracic and abdominal wall motion, anterior tibialis EMG, snore microphone, electrocardiogram, and pulse oximetry. Continuous positive airway pressure (CPAP) was initiated at the beginning of the study and titrated to treat sleep-disordered breathing.  MEDICATIONS  aspirin 81 MG tablet 81 mg, Daily     atorvastatin (LIPITOR) 20 MG tablet 20 mg, Daily     carvedilol (COREG) 12.5 MG tablet 12.5 mg, 2 times daily     citalopram (CELEXA) 20 MG tablet 20 mg, Daily     gabapentin (NEURONTIN) 300 MG capsule 900 mg, 3 times daily     glucose blood test strip      hydrochlorothiazide (HYDRODIURIL) 25 MG tablet 25 mg, Daily     HYDROcodone-acetaminophen (NORCO) 10-325 MG tablet 1 tablet, Every 8 hours PRN     insulin aspart protamine- aspart (NOVOLOG MIX 70/30) (70-30) 100 UNIT/ML injection 15 Units, 3 times daily     Insulin Pen Needle (RELION PEN NEEDLE 31G/8MM) 31G X 8 MM MISC      Insulin Syringe-Needle U-100 (RELION INSULIN SYR 0.3ML/31G) 31G X 5/16" 0.3 ML MISC 1 Syringe, 3 times daily     lisinopril (PRINIVIL,ZESTRIL) 40 MG tablet 40 mg, Daily     metFORMIN (GLUCOPHAGE) 1000 MG tablet      pantoprazole (PROTONIX) 40 MG  tablet 40 mg, Daily    Medications administered by patient during sleep study : No sleep medicine administered.  TECHNICIAN COMMENTS Comments added by technician: Patient was restless all through the night. Patient had difficulty initiating sleep. Patient requested to end study early due to discomfort with equipment.  Comments added by scorer: N/A  RESPIRATORY PARAMETERS Optimal PAP Pressure (cm): 10 AHI at Optimal Pressure (/hr): 6.8 Overall Minimal O2 (%): 90.00 Supine % at Optimal Pressure (%): 80 Minimal O2 at Optimal Pressure (%): 90.0    SLEEP ARCHITECTURE The study was initiated at 9:19:48 PM and ended at 12:20:50 AM. Sleep onset time was 30.0 minutes and the sleep efficiency was 12.4%. The total sleep time was 22.5 minutes. The patient spent 28.89% of the night in stage N1 sleep, 71.11% in stage N2 sleep, 0.00% in stage N3 and 0.00% in REM.Stage REM latency was N/A minutes Wake after sleep onset was 128.6. Alpha intrusion was absent. Supine sleep was 82.22%.  CARDIAC DATA The 2 lead EKG demonstrated sinus rhythm. The mean heart rate was 68.47 beats per minute. Other EKG findings include: None.  LEG MOVEMENT DATA The total Periodic Limb Movements of Sleep (PLMS) were 41. The PLMS index was 109.33. A PLMS index of <15 is considered normal in adults.  IMPRESSIONS - Very poor sleep efficiency at only 12.4%. The test was aborted early at patient request since he felt that he was not getting adeqate air and was titrated only to 10 cm water pressure.  -  Central sleep apnea was not noted during this titration (CAI = 0.0/h). - Significant oxygen desaturations were not observed during this titration (min O2 = 90.00%). - No snoring was audible during this study. - No cardiac abnormalities were observed during this study. 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 Arousals associated with PLMs were significant.  DIAGNOSIS - Obstructive Sleep Apnea (327.23 [G47.33  ICD-10])  RECOMMENDATIONS - Recommend initial  CPAP Auto with EPR/A-Flex with a minimal pressure of 8 cm and maximum of 20 cm with heated humidification.  A Small size Resmed Nasal Pillow Mask AirFit P10 mask was used for the tiotration. - Avoid alcohol, sedatives and other CNS depressants that may worsen sleep apnea and disrupt normal sleep architecture. - Consider treatment of significant PLMS with an index of 109.33. - Sleep hygiene should be reviewed to assess factors that may improve sleep quality. - Weight management and regular exercise should be initiated or continued. - Recommend download in 30 days and sleep clinic evaluation.   Troy Sine, MD, [place], American Board of Sleep Medicine  ELECTRONICALLY SIGNED ON:  04/15/2015, 12:13 PM [place] PH: [phone]   FX: [phone] [place]

## 2015-04-18 HISTORY — PX: CARPAL TUNNEL RELEASE: SHX101

## 2015-04-19 ENCOUNTER — Telehealth: Payer: Self-pay | Admitting: *Deleted

## 2015-04-19 NOTE — Telephone Encounter (Signed)
-----   Message from Troy Sine, MD sent at 04/15/2015 12:25 PM EST ----- Please notify pt we will try a CPAP Auto since his CPAP trial was aborted early; set up with dme and f/u sleep clinic

## 2015-04-19 NOTE — Telephone Encounter (Signed)
Patient notified of sleep study results and recommendations. Referral sent to [facility] . Spoke with Ivin Booty and informed her that patient's current Medicaid will run out at the end of the month.

## 2015-04-23 ENCOUNTER — Other Ambulatory Visit: Payer: Self-pay | Admitting: Family Medicine

## 2015-04-23 MED ORDER — HYDROCHLOROTHIAZIDE 25 MG PO TABS: 25.0000 mg | ORAL_TABLET | Freq: Every day | ORAL | Status: DC

## 2015-04-23 MED ORDER — ATORVASTATIN CALCIUM 20 MG PO TABS: 20.0000 mg | ORAL_TABLET | Freq: Every day | ORAL | Status: DC

## 2015-04-23 MED ORDER — LISINOPRIL 40 MG PO TABS: 40.0000 mg | ORAL_TABLET | Freq: Every day | ORAL | Status: DC

## 2015-04-23 NOTE — Telephone Encounter (Signed)
I sent in refills for the patient's lisinopril, hctz & lipitor.  He is requesting a refill on his Norco also

## 2015-04-24 ENCOUNTER — Telehealth: Payer: Self-pay | Admitting: Family Medicine

## 2015-04-24 MED ORDER — HYDROCODONE-ACETAMINOPHEN 10-325 MG PO TABS: 1.0000 | ORAL_TABLET | Freq: Three times a day (TID) | ORAL | Status: DC | PRN

## 2015-04-24 NOTE — Telephone Encounter (Signed)
Signed & picked up per Dr. Sabra Heck

## 2015-04-24 NOTE — Telephone Encounter (Signed)
Okay to fill lisinopril and hydrodiuril

## 2015-04-24 NOTE — Telephone Encounter (Signed)
Filled 04/10/15 for #30, last seen 02/26/15

## 2015-05-04 ENCOUNTER — Ambulatory Visit: Payer: Medicaid Other | Admitting: Family Medicine

## 2015-05-16 ENCOUNTER — Encounter (HOSPITAL_BASED_OUTPATIENT_CLINIC_OR_DEPARTMENT_OTHER): Payer: Medicaid Other

## 2015-05-18 ENCOUNTER — Other Ambulatory Visit: Payer: Self-pay | Admitting: Family Medicine

## 2015-05-24 ENCOUNTER — Ambulatory Visit (HOSPITAL_BASED_OUTPATIENT_CLINIC_OR_DEPARTMENT_OTHER): Payer: Medicaid Other

## 2015-07-17 ENCOUNTER — Ambulatory Visit (INDEPENDENT_AMBULATORY_CARE_PROVIDER_SITE_OTHER): Payer: Medicaid Other | Admitting: Cardiovascular Disease

## 2015-07-17 ENCOUNTER — Encounter: Payer: Self-pay | Admitting: Cardiovascular Disease

## 2015-07-17 VITALS — BP 110/65 | HR 71 | Ht 69.0 in | Wt 260.8 lb

## 2015-07-17 DIAGNOSIS — Z951 Presence of aortocoronary bypass graft: Secondary | ICD-10-CM

## 2015-07-17 DIAGNOSIS — E669 Obesity, unspecified: Secondary | ICD-10-CM

## 2015-07-17 DIAGNOSIS — G4733 Obstructive sleep apnea (adult) (pediatric): Secondary | ICD-10-CM

## 2015-07-17 DIAGNOSIS — E785 Hyperlipidemia, unspecified: Secondary | ICD-10-CM

## 2015-07-17 DIAGNOSIS — I2510 Atherosclerotic heart disease of native coronary artery without angina pectoris: Secondary | ICD-10-CM

## 2015-07-17 DIAGNOSIS — I10 Essential (primary) hypertension: Secondary | ICD-10-CM

## 2015-07-17 NOTE — Patient Instructions (Signed)
Your physician recommends that you schedule a follow-up appointment in: With Dr Claiborne Billings after the restart of your CPAP machine.

## 2015-07-21 ENCOUNTER — Encounter: Payer: Self-pay | Admitting: Cardiovascular Disease

## 2015-07-21 DIAGNOSIS — G4733 Obstructive sleep apnea (adult) (pediatric): Secondary | ICD-10-CM | POA: Insufficient documentation

## 2015-07-21 NOTE — Progress Notes (Signed)
Patient ID: Shane Frye, male   DOB: August 17, 1965, 50 y.o.   MRN: [id number]    Primary MD: Dr. Alain Honey  HPI: Shane Frye is a 50 y.o. male who presents to the office today for a  follow up cardiology/sleep evaluation.  Shane Frye has a history of hypertension, type 2 diabetes mellitus, obesity, strong family history for premature CAD, and medical noncompliance.  On 11/04/2013 he presented to [facility] hospital in transfer from [facility] with possible  ST segment elevation anterior myocardial infarction.  He had taken cocaine the day before.  I performed emergent cardiac catheterization which revealed severe three-vessel CAD with evidence for coronary calcification.  There was an 80% eccentric hazy proximal LAD stenosis followed by 80% stenosis of the first diagonal branch, 80% mid LAD stenosis, 50% proximal circumflex stenosis with an 80% OM1 stenosis and 90% OM 2 stenosis and a dominant right coronary artery with 95% mid PDA stenosis and 90% distal RCA PLA stenoses.  Intervention was not performed and he was evaluated by Dr. Roxan Hockey and underwent CABG surgery 6 on 11/10/2013.  He developed transient PAF postoperatively and ultimately was discharged on amiodarone 400 mg twice a day.  He was seen by Kerin Ransom on 12/26/2013 and was maintaining sinus rhythm.  At that time, with his diabetes mellitus, hypertension, and edema, he was started on Prinzide.  When I saw him ininitially in follow-up he  denied any recurrent episodes of chest pain.  He had been taking amiodarone at 200 mg daily in addition to carvedilol 3.125 twice a day and was on lisinopril/HCTZ 10/12.5 daily, and recently he has been taking 2 tablets by mouth daily.  He is diabetic and has been taking metformin 1000 mg twice a day.  He apparently has been taking gemfibrozil 600 mg twice a day for hyperlipidemia.  At that time, I recommended he discontinue gemfibrozil and started him on atorvastatin 20 mg daily for more  aggressive lipid-lowering. On f/u laboratory cholesterol was 148, triglycerides 187 HDL 53, and LDL 58.  On his atorvastatin treatment.  On urinalysis , he had evidence for microalbuminuria at 36 g per mL.  A follow-up echo Doppler study 12/16/2014 showed an ejection fraction of 55-60%.  He had normal wall motion.  Since I last saw him 4 months ago, he denies any recurrent chest pain.  He continues to experience shortness of breath with walking.  He is unaware of any palpitations.  He does admit to some paresthesias of his hands and he has been on gabapentin by Dr. Sabra Heck.  He has been taking lisinopril 40 mg in the morning and HCTZ 25 mg as well as carvedilol 12.5 twice a day for his blood pressure.  He continues to be on aspirin at 81 mg.  He is tolerating Lipitor 20 mg.  LDL cholesterol in January was 58.  Triglycerides are mildly elevated at 187.  His diabetes has not been well controlled.  Blood work in June 2016 showed hemoglobin A1c of 8.8.   Due to my concerns that he had symptoms highly suggestive of obstructive sleep apnea he was referred for a sleep study  on 03/11/2015.  This confirmed my suspicion and revealed severe obstructive sleep apnea with an AHI of 93 per hour.  He had significant oxygen desaturation to a nadir of 73%.  There was absence of slow wave in REM sleep.   On 03/30/2015.  He underwent a CPAP titration.  It was very poor sleep efficiency at only  12.4%.  The test was aborted early at patient request since he felt that he was not getting adequate air and as result, he was only titrated up to 10 cm water pressure.  It was recommended that he institute therapy with a CPAP auto unit with a minimum pressure of 8 and maximum pressure of 20 cm water with heated humidification.  Apparently, he only used it for several days and then was noncompliant.  He was concerned about payment since he did not have insurance.  He has now been applying for disability.  He is no longer using drugs, and  admits that he quit drinking and smoking.  An Epworth Sleepiness Scale score was calculated in the office today and this endorsed at 10 with a moderate chance of dozing while sitting and reading, watching TV, as a passenger in a car for an hour without a break, and a slight chance of dozing while sitting inactive in a public place, lying down to rest in the afternoon, sitting quietly after lunch without alcohol and in the car while stopped for a few minutes in traffic.  He presents for reevaluation.   Past Medical History  Diagnosis Date  . Depression   . Insomnia   . Neuropathy ([place])   . Hypertension   . GERD (gastroesophageal reflux disease)   . Diabetes mellitus   . Myocardial infarction ([place]) 11/04/13  . COPD (chronic obstructive pulmonary disease) ([place])   . High cholesterol   . Anxiety     Past Surgical History  Procedure Laterality Date  . Cardioversion N/A 11/06/2013    Procedure: CARDIOVERSION;  Surgeon: Thayer Headings, MD;  Location: [place];  Service: Cardiovascular;  Laterality: N/A;  . Coronary artery bypass graft N/A 11/09/2013    Procedure: CORONARY ARTERY BYPASS GRAFTING (CABG);  Surgeon: Melrose Nakayama, MD;  Location: [place];  Service: Open Heart Surgery;  Laterality: N/A;  . Intraoperative transesophageal echocardiogram N/A 11/09/2013    Procedure: INTRAOPERATIVE TRANSESOPHAGEAL ECHOCARDIOGRAM;  Surgeon: Melrose Nakayama, MD;  Location: [place];  Service: Open Heart Surgery;  Laterality: N/A;  . Left heart catheterization with coronary angiogram N/A 11/04/2013    Procedure: LEFT HEART CATHETERIZATION WITH CORONARY ANGIOGRAM;  Surgeon: Troy Sine, MD;  Location: [facility] CATH LAB;  Service: Cardiovascular;  Laterality: N/A;  . No past surgeries    . Colonoscopy N/A 05/11/2014    Procedure: COLONOSCOPY;  Surgeon: Rogene Houston, MD;  [facility];  Service: Endoscopy;  Laterality: N/A;  930    No Known Allergies  Current Outpatient Prescriptions    Medication Sig Dispense Refill  . aspirin 81 MG tablet Take 81 mg by mouth daily.    Marland Kitchen atorvastatin (LIPITOR) 20 MG tablet Take 1 tablet (20 mg total) by mouth daily. 90 tablet 1  . carvedilol (COREG) 12.5 MG tablet TAKE ONE TABLET BY MOUTH TWICE DAILY 180 tablet 0  . citalopram (CELEXA) 20 MG tablet Take 1 tablet (20 mg total) by mouth daily. 90 tablet 0  . gabapentin (NEURONTIN) 300 MG capsule Take 3 capsules (900 mg total) by mouth 3 (three) times daily. Slowly increase as discussed. 810 capsule 3  . glucose blood test strip Use as instructed 100 each 12  . hydrochlorothiazide (HYDRODIURIL) 25 MG tablet Take 1 tablet (25 mg total) by mouth daily. 90 tablet 0  . HYDROcodone-acetaminophen (NORCO) 10-325 MG tablet Take 1 tablet by mouth every 8 (eight) hours as needed. 30 tablet 0  . insulin aspart  protamine- aspart (NOVOLOG MIX 70/30) (70-30) 100 UNIT/ML injection Inject 15 Units into the skin 3 (three) times daily.    . Insulin Syringe-Needle U-100 (RELION INSULIN SYR 0.3ML/31G) 31G X 5/16" 0.3 ML MISC 1 Syringe by Does not apply route 3 (three) times daily. 300 each 2  . lisinopril (PRINIVIL,ZESTRIL) 40 MG tablet Take 1 tablet (40 mg total) by mouth daily. 90 tablet 0  . metFORMIN (GLUCOPHAGE) 1000 MG tablet TAKE ONE TABLET BY MOUTH TWICE DAILY WITH A  MEAL 180 tablet 0   No current facility-administered medications for this visit.    Social History   Social History  . Marital Status: Married    Spouse Name: Inez Catalina   . Number of Children: 4  . Years of Education: 12   Occupational History  . Not on file.   Social History Main Topics  . Smoking status: Former Smoker -- 0.50 packs/day    Types: Cigarettes    Quit date: 11/10/2013  . Smokeless tobacco: Former Systems developer    Quit date: 11/04/2013  . Alcohol Use: No  . Drug Use: No     Comment: cocaine  . Sexual Activity: Yes   Other Topics Concern  . Not on file   Social History Narrative   Lives with wife and 2 children    Caffeine use: none    Family History  Problem Relation Age of Onset  . Hypertension Paternal Grandfather   . Hypertension Brother   . Diabetes Brother   . Neuropathy Neg Hx     ROS General: Negative; No fevers, chills, or night sweats HEENT: Negative; No changes in vision or hearing, sinus congestion, difficulty swallowing Pulmonary:  He [facility] October for bronchitis. Cardiovascular: See HPI: No chest pain, presyncope, syncope, palpatations GI: Negative; No nausea, vomiting, diarrhea, or abdominal pain GU: Negative; No dysuria, hematuria, or difficulty voiding Musculoskeletal: Negative; no myalgias, joint pain, or weakness Hematologic: Negative; no easy bruising, bleeding Endocrine: Positive for diabetes Neuro: Negative; no changes in balance, headaches Skin: Negative; No rashes or skin lesions Psychiatric: Negative; No behavioral problems, depression Sleep: Positive for  Severe OSA.  Recently confirmed on sleep study ; Did not adequately utilize CPAP.  Other comprehensive 14 point system review is negative   Physical Exam BP 110/65 mmHg  Pulse 71  Ht 5\' 9"  (1.753 m)  Wt 260 lb 12.8 oz (118.298 kg)  BMI 38.50 kg/m2  Wt Readings from Last 3 Encounters:  07/17/15 260 lb 12.8 oz (118.298 kg)  03/27/15 262 lb (118.842 kg)  03/11/15 264 lb (119.75 kg)   General: Alert, oriented, no distress.  Skin: normal turgor, no rashes, warm and dry HEENT: Normocephalic, atraumatic. Pupils equal round and reactive to light; sclera anicteric; extraocular muscles intact, No lid lag; Nose without nasal septal hypertrophy; Mouth/Parynx benign; Mallinpatti scale 3 Neck: No JVD, no carotid bruits; normal carotid upstroke Lungs: clear to ausculatation and percussion bilaterally; no wheezing or rales, normal inspiratory and expiratory effort Chest wall: without tenderness to palpitation Heart: PMI not displaced, RRR, s1 s2 normal, 1/6 systolic murmur, No  diastolic murmur, no rubs, gallops, thrills, or heaves Abdomen: soft, nontender; no hepatosplenomehaly, BS+; abdominal aorta nontender and not dilated by palpation. Back: no CVA tenderness Pulses: 2+  Musculoskeletal:  Positive for bilateral carpal tunnel  symptoms Extremities: Pulses 2+, no clubbing cyanosis or edema, Homan's sign negative  Neurologic: grossly nonfocal; Cranial nerves grossly wnl Psychologic: Normal mood and affect  No new ECG done today  February 2017 ECG (independently read by me):  Normal sinus rhythm at 64 bpm.  Small Q wave in lead 3..  Normal intervals  July 2016 ECG (independently read by me): Normal sinus rhythm at 76 bpm.  Probably progression anteriorly.  No significant ST-T changes.  March 2016 ECG (independently read by me): Normal sinus rhythm at 70 bpm.  Mild T wave changes anterolaterally.  Q wave in lead 3.  Normal intervals.  ECG (independently read by me): Sinus bradycardia 57 bpm.  PR interval 186 ms.  Poor R wave progression.  LABS:  BMP Latest Ref Rng 03/05/2015 06/08/2014 05/23/2014  Glucose 65 - 99 mg/dL 239(H) 180(H) 268(H)  BUN 6 - 24 mg/dL 18 10 25(H)  Creatinine 0.76 - 1.27 mg/dL 0.70(L) 0.70 0.98  BUN/Creat Ratio 9 - 20 26(H) - -  Sodium 134 - 144 mmol/L 135 134(L) 135  Potassium 3.5 - 5.2 mmol/L 5.1 4.4 4.8  Chloride 96 - 106 mmol/L 97 99 100  CO2 18 - 29 mmol/L 21 28 25   Calcium 8.7 - 10.2 mg/dL 9.1 8.4 8.9      Component Value Date/Time   PROT 6.6 03/05/2015 1132   PROT 6.3 11/08/2013 2135   ALBUMIN 4.2 03/05/2015 1132   ALBUMIN 3.1* 11/08/2013 2135   AST 19 03/05/2015 1132   ALT 9 03/05/2015 1132   ALKPHOS 84 03/05/2015 1132   BILITOT 0.7 03/05/2015 1132   BILITOT 0.5 11/08/2013 2135    CBC Latest Ref Rng 06/08/2014 03/02/2014 11/14/2013  WBC 4.0 - 10.5 K/uL 10.0 11.5(A) 8.4  Hemoglobin 13.0 - 17.0 g/dL 13.5 13.5(A) 11.2(L)  Hematocrit 39.0 - 52.0 % 42.2 44.4 34.2(L)  Platelets 150 - 400 K/uL 243 - 321   Lab Results    Component Value Date   MCV 82.4 06/08/2014   MCV 78.4* 03/02/2014   MCV 86.8 11/14/2013   Lab Results  Component Value Date   TSH 2.400 08/07/2014   Lab Results  Component Value Date   HGBA1C 10.6 01/25/2015    BNP No results found for: PROBNP  Lipid Panel     Component Value Date/Time   CHOL 134 01/25/2015 0845   CHOL 149 11/06/2013 0300   TRIG 199* 01/25/2015 0845   HDL 34* 01/25/2015 0845   HDL 44 11/06/2013 0300   CHOLHDL 3.9 01/25/2015 0845   CHOLHDL 3.4 11/06/2013 0300   VLDL 39 11/06/2013 0300   LDLCALC 60 01/25/2015 0845   LDLCALC 66 11/06/2013 0300     RADIOLOGY: No results found.    ASSESSMENT AND PLAN: Mr. [place] Steffy is a 50 year old gentleman with a cardiac risk factor profile notable for hypertension, diabetes mellitus, prior tobacco use, as well as previous use of cocaine. He was found to have severe three-vessel coronary obstructive disease at urgent cardiac catheterization and was treated with CABG surgery 6 in September 2015.  He is maintaining sinus rhythm and is without recurrent atrial fibrillation being off amiodarone.  He is not having any anginal symptomatology.  His blood pressure today is stable on his lisinopril 40 mg, HCTZ 25 mg and carvedilol  12.5 mg twice a day.  He is tolerating atorvastatin for hyperlipidemia with LDL cholesterol at target.  However, triglycerides remain mildly elevated.  His last hemoglobin A1c was increased at 8.8  And he is now on metformin 1000 mg twice a day, insulin for his diabetes mellitus. I again reviewed his sleep study which demonstrated severe obstructive sleep apnea with significant nocturnal oxygen desaturation to a  nadir of 73%.  I reviewed his CPAP titration trial.  Very poor sleep efficiency and optimal CPAP pressure was not achieved.  Consequently, he was set up with an initial CPAP auto unit.  Unfortunately, he did not do this an adequate trial and is unit may have been picked up.  I had a long discussion  with him today concerning the adverse consequences of untreated sleep apnea particularly with reference to his cardiovascular health.  His sleep apnea is very severe and he has severe nocturnal desaturation, which increases risk for atrial fibrillation and potential nocturnal ischemia in addition to other arrhythmias.  He also had significant periodic limb movement disorder of sleep.  He has been applying for disability.  Hopefully this will be successful.  If he is able to reinstitute therapy and get his machine .  I will need to see him within 90 days in sleep clinic to assess compliance and make adjustments to therapy if necessary.  Presently, he is not having any chest pain or recurrent anginal symptoms.  He is obese with a BMI of 38.5, and weight loss is necessary.  I will see him in follow-up evaluation.  Time spent: 25 minutes  Troy Sine, MD, [facility]  07/21/2015 2:12 PM

## 2015-08-06 ENCOUNTER — Encounter (HOSPITAL_COMMUNITY): Payer: Self-pay | Admitting: *Deleted

## 2015-08-06 ENCOUNTER — Emergency Department (HOSPITAL_COMMUNITY)
Admission: EM | Admit: 2015-08-06 | Discharge: 2015-08-06 | Disposition: A | Discharge status: Home / Self Care | Payer: Self-pay | Attending: Emergency Medicine | Admitting: Emergency Medicine

## 2015-08-06 DIAGNOSIS — Z7984 Long term (current) use of oral hypoglycemic drugs: Secondary | ICD-10-CM | POA: Insufficient documentation

## 2015-08-06 DIAGNOSIS — J449 Chronic obstructive pulmonary disease, unspecified: Secondary | ICD-10-CM | POA: Insufficient documentation

## 2015-08-06 DIAGNOSIS — Z87891 Personal history of nicotine dependence: Secondary | ICD-10-CM | POA: Insufficient documentation

## 2015-08-06 DIAGNOSIS — Y929 Unspecified place or not applicable: Secondary | ICD-10-CM | POA: Insufficient documentation

## 2015-08-06 DIAGNOSIS — S39012A Strain of muscle, fascia and tendon of lower back, initial encounter: Secondary | ICD-10-CM | POA: Insufficient documentation

## 2015-08-06 DIAGNOSIS — Z7982 Long term (current) use of aspirin: Secondary | ICD-10-CM | POA: Insufficient documentation

## 2015-08-06 DIAGNOSIS — I252 Old myocardial infarction: Secondary | ICD-10-CM | POA: Insufficient documentation

## 2015-08-06 DIAGNOSIS — Y93F2 Activity, caregiving, lifting: Secondary | ICD-10-CM | POA: Insufficient documentation

## 2015-08-06 DIAGNOSIS — Z794 Long term (current) use of insulin: Secondary | ICD-10-CM | POA: Insufficient documentation

## 2015-08-06 DIAGNOSIS — X500XXA Overexertion from strenuous movement or load, initial encounter: Secondary | ICD-10-CM | POA: Insufficient documentation

## 2015-08-06 DIAGNOSIS — Z79899 Other long term (current) drug therapy: Secondary | ICD-10-CM | POA: Insufficient documentation

## 2015-08-06 DIAGNOSIS — I10 Essential (primary) hypertension: Secondary | ICD-10-CM | POA: Insufficient documentation

## 2015-08-06 DIAGNOSIS — F329 Major depressive disorder, single episode, unspecified: Secondary | ICD-10-CM | POA: Insufficient documentation

## 2015-08-06 DIAGNOSIS — Y999 Unspecified external cause status: Secondary | ICD-10-CM | POA: Insufficient documentation

## 2015-08-06 DIAGNOSIS — E119 Type 2 diabetes mellitus without complications: Secondary | ICD-10-CM | POA: Insufficient documentation

## 2015-08-06 MED ORDER — PREDNISONE 50 MG PO TABS
60.0000 mg | ORAL_TABLET | Freq: Once | ORAL | Status: AC
  Administered 2015-08-06: 60 mg via ORAL
  Filled 2015-08-06: qty 1

## 2015-08-06 MED ORDER — PREDNISONE 50 MG PO TABS: ORAL_TABLET | ORAL | Status: DC

## 2015-08-06 NOTE — Discharge Instructions (Signed)

## 2015-08-06 NOTE — ED Notes (Signed)
Pt complains of lower back pain after moving picnic table 1 day ago on.

## 2015-08-06 NOTE — ED Provider Notes (Signed)
History  By signing my name below, I, Shane Frye, attest that this documentation has been prepared under the direction and in the presence of Shane Fraise, MD. Electronically Signed: Bea Frye, ED Scribe. 08/06/2015. 8:28 AM.  Chief Complaint  Patient presents with  . Back Pain   Patient is a 50 y.o. male presenting with back pain. The history is provided by the patient and medical records. No language interpreter was used.  Back Pain Location:  Lumbar spine Quality:  Unable to specify Radiates to: RLE. Pain severity:  Moderate Pain is:  Same all the time Onset quality:  Sudden Duration:  1 day Timing:  Constant Progression:  Unchanged Chronicity:  Recurrent Context: lifting heavy objects   Relieved by:  Nothing Worsened by:  Movement Ineffective treatments:  OTC medications Associated symptoms: leg pain   Associated symptoms: no bowel incontinence   Risk factors: obesity     HPI Comments:  Shane Frye is a 50 y.o. obese male with PMHx of sciatica who presents to the Emergency Department complaining of lower back pain that began yesterday secondary to lifting a picnic table. He reports the pain radiates down the RLE. He has taken Tylenol for pain with no improvement. Movement increases his pain. He denies alleviating factors. He denies falling. He denies fever, chills, nausea, vomiting, abdominal pain, CP, SOB, bowel or bladder incontinence. He denies any previous back surgeries. PMHx of DM.  Past Medical History  Diagnosis Date  . Depression   . Insomnia   . Neuropathy ([place])   . Hypertension   . GERD (gastroesophageal reflux disease)   . Diabetes mellitus   . Myocardial infarction ([place]) 11/04/13  . COPD (chronic obstructive pulmonary disease) ([place])   . High cholesterol   . Anxiety    Past Surgical History  Procedure Laterality Date  . Cardioversion N/A 11/06/2013    Procedure: CARDIOVERSION;  Surgeon: Thayer Headings, MD;  Location: [place];  Service:  Cardiovascular;  Laterality: N/A;  . Coronary artery bypass graft N/A 11/09/2013    Procedure: CORONARY ARTERY BYPASS GRAFTING (CABG);  Surgeon: Melrose Nakayama, MD;  Location: [place];  Service: Open Heart Surgery;  Laterality: N/A;  . Intraoperative transesophageal echocardiogram N/A 11/09/2013    Procedure: INTRAOPERATIVE TRANSESOPHAGEAL ECHOCARDIOGRAM;  Surgeon: Melrose Nakayama, MD;  Location: [place];  Service: Open Heart Surgery;  Laterality: N/A;  . Left heart catheterization with coronary angiogram N/A 11/04/2013    Procedure: LEFT HEART CATHETERIZATION WITH CORONARY ANGIOGRAM;  Surgeon: Troy Sine, MD;  [facility]: [facility] CATH LAB;  Service: Cardiovascular;  Laterality: N/A;  . No past surgeries    . Colonoscopy N/A 05/11/2014    Procedure: COLONOSCOPY;  Surgeon: Rogene Houston, MD;  [facility];  Service: Endoscopy;  Laterality: N/A;  930   Family History  Problem Relation Age of Onset  . Hypertension Paternal Grandfather   . Hypertension Brother   . Diabetes Brother   . Neuropathy Neg Hx    Social History  Substance Use Topics  . Smoking status: Former Smoker -- 0.50 packs/day    Types: Cigarettes    Quit date: 11/10/2013  . Smokeless tobacco: Former Systems developer    Quit date: 11/04/2013  . Alcohol Use: No    Review of Systems  Gastrointestinal: Negative for bowel incontinence.  Musculoskeletal: Positive for back pain.  All other systems reviewed and are negative.   Allergies  Review of patient's allergies indicates no known allergies.  Home Medications  Prior to Admission medications   Medication Sig Start Date End Date Taking? Authorizing Provider  aspirin 81 MG tablet Take 81 mg by mouth daily.    Historical Provider, MD  atorvastatin (LIPITOR) 20 MG tablet Take 1 tablet (20 mg total) by mouth daily. 04/23/15   Wardell Honour, MD  carvedilol (COREG) 12.5 MG tablet TAKE ONE TABLET BY MOUTH TWICE DAILY 05/18/15   Wardell Honour, MD  citalopram (CELEXA)  20 MG tablet Take 1 tablet (20 mg total) by mouth daily. 02/26/15   Wardell Honour, MD  gabapentin (NEURONTIN) 300 MG capsule Take 3 capsules (900 mg total) by mouth 3 (three) times daily. Slowly increase as discussed. 03/05/15   Melvenia Beam, MD  glucose blood test strip Use as instructed 08/07/14   Wardell Honour, MD  hydrochlorothiazide (HYDRODIURIL) 25 MG tablet Take 1 tablet (25 mg total) by mouth daily. 04/23/15   Wardell Honour, MD  HYDROcodone-acetaminophen (NORCO) 10-325 MG tablet Take 1 tablet by mouth every 8 (eight) hours as needed. 04/24/15   Wardell Honour, MD  insulin aspart protamine- aspart (NOVOLOG MIX 70/30) (70-30) 100 UNIT/ML injection Inject 15 Units into the skin 3 (three) times daily.    Historical Provider, MD  Insulin Syringe-Needle U-100 (RELION INSULIN SYR 0.3ML/31G) 31G X 5/16" 0.3 ML MISC 1 Syringe by Does not apply route 3 (three) times daily. 03/06/15   Wardell Honour, MD  lisinopril (PRINIVIL,ZESTRIL) 40 MG tablet Take 1 tablet (40 mg total) by mouth daily. 04/23/15   Wardell Honour, MD  metFORMIN (GLUCOPHAGE) 1000 MG tablet TAKE ONE TABLET BY MOUTH TWICE DAILY WITH A  MEAL 02/26/15   Wardell Honour, MD   Triage Vitals: BP 155/91 mmHg  Pulse 66  Temp(Src) 97.7 F (36.5 C)  Resp 18  Ht 5\' 9"  (1.753 m)  Wt 250 lb (113.399 kg)  BMI 36.90 kg/m2  SpO2 98%  Physical Exam  CONSTITUTIONAL: Well developed/well nourished HEAD: Normocephalic/atraumatic ENMT: Mucous membranes moist NECK: supple no meningeal signs SPINE/BACK:entire spine nontender; right lumbar paraspinal tenderness noted. CV: S1/S2 noted, no murmurs/rubs/gallops noted LUNGS: Lungs are clear to auscultation bilaterally, no apparent distress ABDOMEN: soft, nontender, no rebound or guarding GU:no cva tenderness NEURO: Awake/alert, equal motor 5/5 strength noted with the following: hip flexion/knee flexion/extension, foot dorsi/plantar flexion, great toe extension intact bilaterally, no clonus  bilaterally, no sensory deficit in any dermatome.  Equal patellar/achilles reflex noted (2+) in bilateral lower extremities.  Pt is able to ambulate unassisted. EXTREMITIES: pulses normal, full ROM SKIN: warm, color normal PSYCH: no abnormalities of mood noted, alert and oriented to situation   ED Course  Procedures  DIAGNOSTIC STUDIES: Oxygen Saturation is 98% on RA, normal by my interpretation.   COORDINATION OF CARE: 8:10 AM- Will prescribe steroids and Vicodin. Advised pt to keep his CBGs checked due to steroid use. Pt verbalizes understanding and agrees to plan.  Pt well appearing He is ambulatory Narcotic database reviewed, pt had narcotic Rx given less than 10 days ago Advised f/u as outpatient for pain control needs Discussed strict return precautions Advised to monitor glucose while on prednisone   Medications  predniSONE (DELTASONE) tablet 60 mg (60 mg Oral Given 08/06/15 0835)    MDM   Final diagnoses:  Lumbar strain, initial encounter    Nursing notes including past medical history and social history reviewed and considered in documentation Previous records reviewed and considered Narcotic database reviewed and considered in decision making  I personally performed the services described in this documentation, which was scribed in my presence. The recorded information has been reviewed and is accurate.      Shane Fraise, MD 08/06/15 [phone]

## 2015-08-17 ENCOUNTER — Other Ambulatory Visit: Payer: Self-pay | Admitting: Family Medicine

## 2015-08-21 ENCOUNTER — Other Ambulatory Visit: Payer: Self-pay | Admitting: Family Medicine

## 2015-08-23 ENCOUNTER — Other Ambulatory Visit: Payer: Self-pay | Admitting: Family Medicine

## 2015-09-11 ENCOUNTER — Encounter: Payer: Self-pay | Admitting: Family Medicine

## 2015-09-11 ENCOUNTER — Ambulatory Visit (INDEPENDENT_AMBULATORY_CARE_PROVIDER_SITE_OTHER): Payer: Medicaid Other | Admitting: Family Medicine

## 2015-09-11 VITALS — BP 147/93 | Temp 97.3°F | Ht 69.0 in | Wt 262.2 lb

## 2015-09-11 DIAGNOSIS — E0959 Drug or chemical induced diabetes mellitus with other circulatory complications: Secondary | ICD-10-CM | POA: Diagnosis not present

## 2015-09-11 DIAGNOSIS — I10 Essential (primary) hypertension: Secondary | ICD-10-CM

## 2015-09-11 DIAGNOSIS — E785 Hyperlipidemia, unspecified: Secondary | ICD-10-CM | POA: Diagnosis not present

## 2015-09-11 DIAGNOSIS — M5441 Lumbago with sciatica, right side: Secondary | ICD-10-CM

## 2015-09-11 LAB — BAYER DCA HB A1C WAIVED: HB A1C (BAYER DCA - WAIVED): 10.6 % — ABNORMAL HIGH (ref <7.0)

## 2015-09-11 MED ORDER — HYDROCODONE-ACETAMINOPHEN 10-325 MG PO TABS: 1.0000 | ORAL_TABLET | Freq: Three times a day (TID) | ORAL | 0 refills | Status: DC | PRN

## 2015-09-11 MED ORDER — PRAVASTATIN SODIUM 40 MG PO TABS: 40.0000 mg | ORAL_TABLET | Freq: Every day | ORAL | 3 refills | Status: DC

## 2015-09-11 MED ORDER — HYDROCODONE-ACETAMINOPHEN 10-325 MG PO TABS: 1.0000 | ORAL_TABLET | Freq: Four times a day (QID) | ORAL | 0 refills | Status: DC | PRN

## 2015-09-11 NOTE — Progress Notes (Signed)
Subjective:    Patient ID: Shane Frye, male    DOB: 1965/06/02, 50 y.o.   MRN: [id number]  HPI 50 year old generally here to follow-up multiple medical problems including diabetes hypertension. Today he is complaining of back pain with radiation down his right leg to his foot with weakness. He has been in the ER several times and given prescriptions for oxycodone without relief. He is requesting MRI.  Patient Active Problem List   Diagnosis Date Noted  . OSA (obstructive sleep apnea) 07/21/2015  . Hypertension   . GERD (gastroesophageal reflux disease)   . Diabetes ([place])   . Insomnia   . Neuropathy ([place])   . Rotator cuff tear 01/30/2014  . Dyspnea on exertion 12/26/2013  . Smoker 12/26/2013  . PAF (paroxysmal atrial fibrillation) ([place]) 12/26/2013  . 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 STEMI (ST elevation myocardial infarction) ([place]) 11/04/2013  . HTN (hypertension) 11/04/2013  . DM (diabetes mellitus) ([place]) 11/04/2013  . HLD (hyperlipidemia) 11/04/2013  . Obesity 11/04/2013  . Cocaine abuse 11/04/2013  . H/O noncompliance with medical treatment, presenting hazards to health 11/04/2013  . CAD (coronary artery disease), native coronary artery 11/04/2013   Outpatient Encounter Prescriptions as of 09/11/2015  Medication Sig  . aspirin 81 MG tablet Take 81 mg by mouth daily.  Marland Kitchen atorvastatin (LIPITOR) 20 MG tablet Take 1 tablet (20 mg total) by mouth daily.  . carvedilol (COREG) 12.5 MG tablet TAKE ONE TABLET BY MOUTH TWICE DAILY  . citalopram (CELEXA) 20 MG tablet Take 1 tablet (20 mg total) by mouth daily.  Marland Kitchen gabapentin (NEURONTIN) 300 MG capsule Take 3 capsules (900 mg total) by mouth 3 (three) times daily. Slowly increase as discussed.  Marland Kitchen glucose blood test strip Use as instructed  . hydrochlorothiazide (HYDRODIURIL) 25 MG tablet TAKE ONE TABLET BY MOUTH ONCE DAILY  . insulin aspart protamine- aspart (NOVOLOG MIX 70/30) (70-30) 100 UNIT/ML injection Inject 15 Units into the skin 3  (three) times daily.  . Insulin Syringe-Needle U-100 (RELION INSULIN SYR 0.3ML/31G) 31G X 5/16" 0.3 ML MISC 1 Syringe by Does not apply route 3 (three) times daily.  Marland Kitchen lisinopril (PRINIVIL,ZESTRIL) 40 MG tablet TAKE ONE TABLET BY MOUTH ONCE DAILY  . metFORMIN (GLUCOPHAGE) 1000 MG tablet TAKE ONE TABLET BY MOUTH TWICE DAILY WITH A  MEAL  . oxyCODONE-acetaminophen (PERCOCET/ROXICET) 5-325 MG tablet Take 1 tablet by mouth every 4 (four) hours as needed for severe pain.  . [DISCONTINUED] HYDROcodone-acetaminophen (NORCO) 10-325 MG tablet Take 1 tablet by mouth every 8 (eight) hours as needed.  . [DISCONTINUED] predniSONE (DELTASONE) 50 MG tablet One tablet PO daily for 5 days Please check your blood sugar frequently while taking medicine   No facility-administered encounter medications on file as of 09/11/2015.       Review of Systems  Constitutional: Negative.   Respiratory: Negative.   Musculoskeletal: Positive for back pain.  Neurological: Negative.   Psychiatric/Behavioral: Negative.        Objective:   Physical Exam  Constitutional: He appears well-developed and well-nourished.  Cardiovascular: Normal rate and regular rhythm.   Pulmonary/Chest: Effort normal and breath sounds normal.  Musculoskeletal:  Back: Decreased range of motion in all planes. Straight leg raising positive at 80. Reflexes are not elicited.   BP (!) 147/93 (BP Location: Left Arm, Patient Position: Sitting, Cuff Size: Large)   Temp 97.3 F (36.3 C) (Oral)   Ht 5\' 9"  (1.753 m)   Wt 262 lb 3.2 oz (118.9 kg)  BMI 38.72 kg/m        Assessment & Plan:  1. Essential hypertension Pressure is fair today on combination beta blocker ACE inhibitor and diuretic  2. 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 - Bayer DCA Hb A1c  Waived  3. HLD (hyperlipidemia) Lipids were last assessed almost a months ago. He continues on atorvastatin 20 mg  4. Right-sided low back pain with right-sided sciatica I think further assessment through MRI is indicated based on degree of pain and disability  Wardell Honour MD

## 2015-09-11 NOTE — Progress Notes (Signed)
   Subjective:    Patient ID: Shane Frye, male    DOB: 1965/02/21, 50 y.o.   MRN: [id number]  HPI    Review of Systems     Objective:   Physical Exam        Assessment & Plan:

## 2015-10-10 ENCOUNTER — Telehealth: Payer: Self-pay | Admitting: Family Medicine

## 2015-10-10 MED ORDER — HYDROCODONE-ACETAMINOPHEN 10-325 MG PO TABS
1.0000 | ORAL_TABLET | Freq: Four times a day (QID) | ORAL | 0 refills | Status: DC | PRN
Start: 2015-10-10 — End: 2015-11-07

## 2015-10-10 NOTE — Telephone Encounter (Signed)
Look into this? Looks like he wrote 2 rxs on 09/13/15

## 2015-10-10 NOTE — Telephone Encounter (Signed)
Last month it was printed for #30 and #60 === the #30 was not picked up   Is is due this week for refill - pended order for miller to approve.  Will print

## 2015-10-19 ENCOUNTER — Other Ambulatory Visit: Payer: Self-pay | Admitting: Family Medicine

## 2015-10-24 ENCOUNTER — Other Ambulatory Visit: Payer: Self-pay | Admitting: Family Medicine

## 2015-10-24 DIAGNOSIS — M5440 Lumbago with sciatica, unspecified side: Secondary | ICD-10-CM

## 2015-10-24 NOTE — Telephone Encounter (Signed)
Order placed

## 2015-10-28 ENCOUNTER — Other Ambulatory Visit: Payer: Self-pay | Admitting: Family Medicine

## 2015-11-06 ENCOUNTER — Telehealth: Payer: Self-pay | Admitting: Family Medicine

## 2015-11-07 ENCOUNTER — Telehealth: Payer: Self-pay | Admitting: Family Medicine

## 2015-11-07 MED ORDER — HYDROCODONE-ACETAMINOPHEN 10-325 MG PO TABS: 1.0000 | ORAL_TABLET | Freq: Four times a day (QID) | ORAL | 0 refills | Status: DC | PRN

## 2015-11-07 NOTE — Telephone Encounter (Signed)
Will be working on prior authorizations tommorow

## 2015-11-07 NOTE — Telephone Encounter (Signed)
We could try to arrange some physical therapy for his back. I think if pain persists after therapy and MRI might be more likely to be approved.

## 2015-11-07 NOTE — Telephone Encounter (Signed)
Hydrocodone refilled per patient request

## 2015-11-07 NOTE — Telephone Encounter (Signed)
Patient aware and RX placed up front.

## 2015-11-08 ENCOUNTER — Telehealth: Payer: Self-pay | Admitting: Family Medicine

## 2015-11-08 ENCOUNTER — Other Ambulatory Visit: Payer: Self-pay

## 2015-11-08 MED ORDER — OXYCODONE-ACETAMINOPHEN 5-325 MG PO TABS: 1.0000 | ORAL_TABLET | ORAL | 0 refills | Status: DC | PRN

## 2015-11-09 ENCOUNTER — Other Ambulatory Visit: Payer: Self-pay | Admitting: *Deleted

## 2015-11-09 MED ORDER — HYDROCODONE-ACETAMINOPHEN 10-325 MG PO TABS: 1.0000 | ORAL_TABLET | Freq: Four times a day (QID) | ORAL | 0 refills | Status: DC | PRN

## 2015-11-12 ENCOUNTER — Telehealth: Payer: Self-pay

## 2015-11-13 NOTE — Telephone Encounter (Signed)
x

## 2015-11-21 ENCOUNTER — Other Ambulatory Visit: Payer: Self-pay

## 2015-11-21 ENCOUNTER — Telehealth: Payer: Self-pay | Admitting: Family Medicine

## 2015-11-21 MED ORDER — HYDROCODONE-ACETAMINOPHEN 10-325 MG PO TABS: 1.0000 | ORAL_TABLET | Freq: Four times a day (QID) | ORAL | 0 refills | Status: DC | PRN

## 2015-11-21 NOTE — Telephone Encounter (Signed)
Pt aware written Rx is at front desk ready for pickup  

## 2015-11-21 NOTE — Telephone Encounter (Signed)
Sent to Dr Sabra Heck

## 2015-11-26 NOTE — Progress Notes (Signed)
Subjective:    Patient ID: Shane Frye, male    DOB: 12/23/1965, 50 y.o.   MRN: [id number]  HPI 50 year old man with continuing back pain. Pain got bad enough that he went to the emergency room. They performed a CT scan which shows disc disease at several levels. There is some inconsistency with his symptoms and what the CT shows. His symptoms are primarily in the right leg with weakness but CT scan shows compression on the left side. Also, with the ER visit he was given oxycodone No. 10 and he says they are much more effective than the hydrocodone.  Patient Active Problem List   Diagnosis Date Noted  . OSA (obstructive sleep apnea) 07/21/2015  . Hypertension   . GERD (gastroesophageal reflux disease)   . Diabetes ([place])   . Insomnia   . Neuropathy ([place])   . Rotator cuff tear 01/30/2014  . Dyspnea on exertion 12/26/2013  . Smoker 12/26/2013  . PAF (paroxysmal atrial fibrillation) ([place]) 12/26/2013  . 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 STEMI (ST elevation myocardial infarction) ([place]) 11/04/2013  . HTN (hypertension) 11/04/2013  . DM (diabetes mellitus) ([place]) 11/04/2013  . HLD (hyperlipidemia) 11/04/2013  . Obesity 11/04/2013  . Cocaine abuse 11/04/2013  . H/O noncompliance with medical treatment, presenting hazards to health 11/04/2013  . CAD (coronary artery disease), native coronary artery 11/04/2013   Outpatient Encounter Prescriptions as of 11/27/2015  Medication Sig  . aspirin 81 MG tablet Take 81 mg by mouth daily.  . carvedilol (COREG) 12.5 MG tablet TAKE ONE TABLET BY MOUTH TWICE DAILY  . citalopram (CELEXA) 20 MG tablet Take 1 tablet (20 mg total) by mouth daily.  Marland Kitchen gabapentin (NEURONTIN) 300 MG capsule Take 3 capsules (900 mg total) by mouth 3 (three) times daily. Slowly increase as discussed.  . hydrochlorothiazide (HYDRODIURIL) 25 MG tablet TAKE ONE TABLET BY MOUTH ONCE DAILY  . HYDROcodone-acetaminophen (NORCO) 10-325 MG tablet Take 1 tablet by mouth every 6 (six) hours as  needed.  . insulin aspart protamine- aspart (NOVOLOG MIX 70/30) (70-30) 100 UNIT/ML injection Inject 15 Units into the skin 3 (three) times daily.  . Insulin Syringe-Needle U-100 (RELION INSULIN SYR 0.3ML/31G) 31G X 5/16" 0.3 ML MISC 1 Syringe by Does not apply route 3 (three) times daily.  Marland Kitchen lisinopril (PRINIVIL,ZESTRIL) 40 MG tablet TAKE ONE TABLET BY MOUTH ONCE DAILY  . metFORMIN (GLUCOPHAGE) 1000 MG tablet TAKE ONE TABLET BY MOUTH TWICE DAILY WITH A  MEAL  . pantoprazole (PROTONIX) 40 MG tablet TAKE ONE TABLET BY MOUTH ONCE DAILY  . pravastatin (PRAVACHOL) 40 MG tablet Take 1 tablet (40 mg total) by mouth daily.  Marland Kitchen atorvastatin (LIPITOR) 20 MG tablet Take 1 tablet (20 mg total) by mouth daily. (Patient not taking: Reported on 11/27/2015)  . glucose blood test strip Use as instructed  . [DISCONTINUED] metFORMIN (GLUCOPHAGE) 1000 MG tablet TAKE ONE TABLET BY MOUTH TWICE DAILY WITH A  MEAL   No facility-administered encounter medications on file as of 11/27/2015.       Review of Systems  Respiratory: Negative.   Cardiovascular: Negative.   Gastrointestinal: Negative.   Musculoskeletal: Positive for back pain.       Objective:   Physical Exam  Constitutional: He appears well-developed and well-nourished.  Cardiovascular: Normal rate and regular rhythm.   Pulmonary/Chest: Effort normal and breath sounds normal.  Musculoskeletal:  Focused exam on neurologic changes in the legs. There is weakness in the right leg in the hip flexors  and knee flexors. Reflexes are diminished in the ankles on both legs compared to the patellar reflexes.   BP (!) 157/86   Pulse 69   Temp 97.1 F (36.2 C) (Oral)   Ht 5\' 9"  (1.753 m)   Wt 266 lb (120.7 kg)   BMI 39.28 kg/m         Assessment & Plan:  1. Chronic low back pain without sciatica, unspecified back pain laterality As noted, CT scan shows disc disease at several levels. I would like to get a neurosurgical opinion. Even if it is not a  surgical problem, it may be that some steroid injections were disc are bulging with help. Will refill oxycodone since it seemed to be more effective than hydrocodone - Ambulatory referral to Neurology  2. Type 2 diabetes mellitus with diabetic peripheral angiopathy without gangrene, with long-term current use of insulin (HCC) Last A1c was 10.6. Patient says he is doing better with diet.  Wardell Honour MD

## 2015-11-27 ENCOUNTER — Ambulatory Visit (INDEPENDENT_AMBULATORY_CARE_PROVIDER_SITE_OTHER): Payer: Medicaid Other | Admitting: Family Medicine

## 2015-11-27 ENCOUNTER — Encounter: Payer: Self-pay | Admitting: Family Medicine

## 2015-11-27 VITALS — BP 157/86 | HR 69 | Temp 97.1°F | Ht 69.0 in | Wt 266.0 lb

## 2015-11-27 DIAGNOSIS — Z794 Long term (current) use of insulin: Secondary | ICD-10-CM | POA: Diagnosis not present

## 2015-11-27 DIAGNOSIS — M5116 Intervertebral disc disorders with radiculopathy, lumbar region: Secondary | ICD-10-CM

## 2015-11-27 DIAGNOSIS — E1151 Type 2 diabetes mellitus with diabetic peripheral angiopathy without gangrene: Secondary | ICD-10-CM

## 2015-11-27 DIAGNOSIS — G8929 Other chronic pain: Secondary | ICD-10-CM

## 2015-11-27 DIAGNOSIS — M545 Low back pain: Secondary | ICD-10-CM | POA: Diagnosis not present

## 2015-11-27 MED ORDER — OXYCODONE-ACETAMINOPHEN 5-325 MG PO TABS: 1.0000 | ORAL_TABLET | ORAL | 0 refills | Status: DC | PRN

## 2015-11-27 MED ORDER — CARVEDILOL 12.5 MG PO TABS: 12.5000 mg | ORAL_TABLET | Freq: Two times a day (BID) | ORAL | 1 refills | Status: DC

## 2015-11-27 MED ORDER — CITALOPRAM HYDROBROMIDE 20 MG PO TABS: 20.0000 mg | ORAL_TABLET | Freq: Every day | ORAL | 0 refills | Status: DC

## 2015-11-27 MED ORDER — LISINOPRIL 40 MG PO TABS: 40.0000 mg | ORAL_TABLET | Freq: Every day | ORAL | 1 refills | Status: DC

## 2015-11-27 MED ORDER — METFORMIN HCL 1000 MG PO TABS: ORAL_TABLET | ORAL | 0 refills | Status: DC

## 2015-11-27 MED ORDER — HYDROCHLOROTHIAZIDE 25 MG PO TABS: 25.0000 mg | ORAL_TABLET | Freq: Every day | ORAL | 1 refills | Status: DC

## 2015-11-28 ENCOUNTER — Encounter: Payer: Self-pay | Admitting: Cardiovascular Disease

## 2015-11-28 ENCOUNTER — Ambulatory Visit (HOSPITAL_BASED_OUTPATIENT_CLINIC_OR_DEPARTMENT_OTHER): Payer: Medicaid Other | Attending: Cardiovascular Disease | Admitting: Cardiovascular Disease

## 2015-11-28 ENCOUNTER — Ambulatory Visit (INDEPENDENT_AMBULATORY_CARE_PROVIDER_SITE_OTHER): Payer: Medicaid Other | Admitting: Cardiovascular Disease

## 2015-11-28 ENCOUNTER — Encounter (INDEPENDENT_AMBULATORY_CARE_PROVIDER_SITE_OTHER): Payer: Self-pay

## 2015-11-28 VITALS — BP 120/62 | HR 84 | Ht 69.0 in | Wt 265.0 lb

## 2015-11-28 VITALS — Ht 69.0 in | Wt 266.0 lb

## 2015-11-28 DIAGNOSIS — J449 Chronic obstructive pulmonary disease, unspecified: Secondary | ICD-10-CM

## 2015-11-28 DIAGNOSIS — I10 Essential (primary) hypertension: Secondary | ICD-10-CM | POA: Diagnosis not present

## 2015-11-28 DIAGNOSIS — Z951 Presence of aortocoronary bypass graft: Secondary | ICD-10-CM

## 2015-11-28 DIAGNOSIS — G4733 Obstructive sleep apnea (adult) (pediatric): Secondary | ICD-10-CM | POA: Diagnosis not present

## 2015-11-28 DIAGNOSIS — I2510 Atherosclerotic heart disease of native coronary artery without angina pectoris: Secondary | ICD-10-CM

## 2015-11-28 DIAGNOSIS — E782 Mixed hyperlipidemia: Secondary | ICD-10-CM

## 2015-11-28 DIAGNOSIS — Z7984 Long term (current) use of oral hypoglycemic drugs: Secondary | ICD-10-CM | POA: Insufficient documentation

## 2015-11-28 DIAGNOSIS — I480 Paroxysmal atrial fibrillation: Secondary | ICD-10-CM

## 2015-11-28 DIAGNOSIS — Z7982 Long term (current) use of aspirin: Secondary | ICD-10-CM | POA: Insufficient documentation

## 2015-11-28 NOTE — Patient Instructions (Signed)
Your physician has recommended that you have a sleep study. This test records several body functions during sleep, including: brain activity, eye movement, oxygen and carbon dioxide blood levels, heart rate and rhythm, breathing rate and rhythm, the flow of air through your mouth and nose, snoring, body muscle movements, and chest and belly movement  Your physician recommends that you schedule a follow-up appointment in: January 2018 sleep clinic.

## 2015-12-01 NOTE — Progress Notes (Signed)
Patient ID: KHAIRO ALONGI, male   DOB: 27-Jan-1966, 50 y.o.   MRN: [id number]    Primary MD: Dr. Alain Honey  HPI: MOHANAD GUSTAVE is a 50 y.o. male who presents to the office today for a  5 month follow up cardiology/sleep evaluation.  [name] has a history of hypertension, type 2 diabetes mellitus, obesity, strong family history for premature CAD, and medical noncompliance.  On 11/04/2013 he presented to [facility] hospital in transfer from [facility] with possible  ST segment elevation anterior myocardial infarction.  He had taken cocaine the day before.  I performed emergent cardiac catheterization which revealed severe three-vessel CAD with evidence for coronary calcification.  There was an 80% eccentric hazy proximal LAD stenosis followed by 80% stenosis of the first diagonal branch, 80% mid LAD stenosis, 50% proximal circumflex stenosis with an 80% OM1 stenosis and 90% OM 2 stenosis and a dominant right coronary artery with 95% mid PDA stenosis and 90% distal RCA PLA stenoses.  Intervention was not performed and he was evaluated by Dr. Roxan Hockey and underwent CABG surgery 6 on 11/10/2013.  He developed transient PAF postoperatively and ultimately was discharged on amiodarone 400 mg twice a day.  He was seen by Kerin Ransom on 12/26/2013 and was maintaining sinus rhythm.  At that time, with his diabetes mellitus, hypertension, and edema, he was started on Prinzide.  When I saw him ininitially in follow-up he  denied any recurrent episodes of chest pain.  He had been taking amiodarone at 200 mg daily in addition to carvedilol 3.125 twice a day and was on lisinopril/HCTZ 10/12.5 daily, and recently he has been taking 2 tablets by mouth daily.  He is diabetic and has been taking metformin 1000 mg twice a day.  He apparently has been taking gemfibrozil 600 mg twice a day for hyperlipidemia.  At that time, I recommended he discontinue gemfibrozil and started him on atorvastatin 20 mg daily for more  aggressive lipid-lowering. On f/u laboratory cholesterol was 148, triglycerides 187 HDL 53, and LDL 58.  On his atorvastatin treatment.  On urinalysis , he had evidence for microalbuminuria at 36 g per mL.  An echo Doppler study 12/16/2014 showed an ejection fraction of 55-60%.  He had normal wall motion.  Since I last saw him 4 months ago, he denies any recurrent chest pain.  He continues to experience shortness of breath with walking.  He is unaware of any palpitations.  He does admit to some paresthesias of his hands and he has been on gabapentin by Dr. Sabra Heck.  He has been taking lisinopril 40 mg in the morning and HCTZ 25 mg as well as carvedilol 12.5 twice a day for his blood pressure.  He continues to be on aspirin at 81 mg.  He is tolerating Lipitor 20 mg.  LDL cholesterol in January was 58.  Triglycerides are mildly elevated at 187.  His diabetes has not been well controlled.  Blood work in June 2016 showed hemoglobin A1c of 8.8.   Due to my concerns that he had symptoms highly suggestive of obstructive sleep apnea he was referred for a sleep study  on 03/11/2015.  This confirmed my suspicion and revealed severe obstructive sleep apnea with an AHI of 93 per hour.  He had significant oxygen desaturation to a nadir of 73%.  There was absence of slow wave in REM sleep.   On 03/30/2015 he underwent a CPAP titration.  It was very poor sleep efficiency at only  12.4%.  The test was aborted early at patient request since he felt that he was not getting adequate air and as result, he was only titrated up to 10 cm water pressure.  It was recommended that he institute therapy with a CPAP auto unit with a minimum pressure of 8 and maximum pressure of 20 cm water with heated humidification.  Apparently, he only used it for several days and then was noncompliant.  He was concerned about payment since he did not have insurance.  He had applied for disability.  He is no longer using drugs, and admits that he quit  drinking and smoking.  In May 2017 an Epworth Sleepiness Scale score  endorsed at 10 with a moderate chance of dozing while sitting and reading, watching TV, as a passenger in a car for an hour without a break, and a slight chance of dozing while sitting inactive in a public place, lying down to rest in the afternoon, sitting quietly after lunch without alcohol and in the car while stopped for a few minutes in traffic.    Since  last saw him, he has just received Medicaid now interested in pursuing CPAP.  He denies any chest pain.  He has COPD.  He admits to shortness of breath with activity.  He continues to be tired and fatigue since he has not yet instituted CPAP.  He presents for reevaluation.   Past Medical History:  Diagnosis Date  . Anxiety   . COPD (chronic obstructive pulmonary disease) ([place])   . Depression   . Diabetes mellitus   . GERD (gastroesophageal reflux disease)   . High cholesterol   . Hypertension   . Insomnia   . Myocardial infarction 11/04/13  . Neuropathy [facility])     Past Surgical History:  Procedure Laterality Date  . CARDIOVERSION N/A 11/06/2013   Procedure: CARDIOVERSION;  Surgeon: Thayer Headings, MD;  Location: [place];  Service: Cardiovascular;  Laterality: N/A;  . CARPAL TUNNEL RELEASE  04/2015  . COLONOSCOPY N/A 05/11/2014   Procedure: COLONOSCOPY;  Surgeon: Rogene Houston, MD;  [facility];  Service: Endoscopy;  Laterality: N/A;  930  . CORONARY ARTERY BYPASS GRAFT N/A 11/09/2013   Procedure: CORONARY ARTERY BYPASS GRAFTING (CABG);  Surgeon: Melrose Nakayama, MD;  Location: [place];  Service: Open Heart Surgery;  Laterality: N/A;  . INTRAOPERATIVE TRANSESOPHAGEAL ECHOCARDIOGRAM N/A 11/09/2013   Procedure: INTRAOPERATIVE TRANSESOPHAGEAL ECHOCARDIOGRAM;  Surgeon: Melrose Nakayama, MD;  Location: [place];  Service: Open Heart Surgery;  Laterality: N/A;  . LEFT HEART CATHETERIZATION WITH CORONARY ANGIOGRAM N/A 11/04/2013   Procedure: LEFT HEART  CATHETERIZATION WITH CORONARY ANGIOGRAM;  Surgeon: Troy Sine, MD;  Location: [facility] CATH LAB;  Service: Cardiovascular;  Laterality: N/A;  . NO PAST SURGERIES      No Known Allergies  Current Outpatient Prescriptions  Medication Sig Dispense Refill  . aspirin 81 MG tablet Take 81 mg by mouth daily.    . carvedilol (COREG) 12.5 MG tablet Take 1 tablet (12.5 mg total) by mouth 2 (two) times daily. 180 tablet 1  . citalopram (CELEXA) 20 MG tablet Take 1 tablet (20 mg total) by mouth daily. 90 tablet 0  . gabapentin (NEURONTIN) 300 MG capsule Take 3 capsules (900 mg total) by mouth 3 (three) times daily. Slowly increase as discussed. 810 capsule 3  . glucose blood test strip Use as instructed 100 each 12  . hydrochlorothiazide (HYDRODIURIL) 25 MG tablet Take 1 tablet (25 mg  total) by mouth daily. 90 tablet 1  . HYDROcodone-acetaminophen (NORCO) 10-325 MG tablet Take 1 tablet by mouth every 6 (six) hours as needed. 56 tablet 0  . insulin aspart protamine- aspart (NOVOLOG MIX 70/30) (70-30) 100 UNIT/ML injection Inject 15 Units into the skin 3 (three) times daily.    . Insulin Syringe-Needle U-100 (RELION INSULIN SYR 0.3ML/31G) 31G X 5/16" 0.3 ML MISC 1 Syringe by Does not apply route 3 (three) times daily. 300 each 2  . lisinopril (PRINIVIL,ZESTRIL) 40 MG tablet Take 1 tablet (40 mg total) by mouth daily. 90 tablet 1  . metFORMIN (GLUCOPHAGE) 1000 MG tablet TAKE ONE TABLET BY MOUTH TWICE DAILY WITH A  MEAL 180 tablet 0  . oxyCODONE-acetaminophen (ROXICET) 5-325 MG tablet Take 1 tablet by mouth every 4 (four) hours as needed for severe pain. 40 tablet 0  . pantoprazole (PROTONIX) 40 MG tablet TAKE ONE TABLET BY MOUTH ONCE DAILY 90 tablet 0  . pravastatin (PRAVACHOL) 40 MG tablet Take 1 tablet (40 mg total) by mouth daily. 90 tablet 3   No current facility-administered medications for this visit.     Social History   Social History  . Marital status: Married    Spouse name: Inez Catalina   . Number  of children: 4  . Years of education: 12   Occupational History  . Not on file.   Social History Main Topics  . Smoking status: Former Smoker    Packs/day: 0.50    Types: Cigarettes    Quit date: 11/10/2013  . Smokeless tobacco: Former Systems developer    Quit date: 11/04/2013  . Alcohol use No  . Drug use: No     Comment: cocaine  . Sexual activity: Yes   Other Topics Concern  . Not on file   Social History Narrative   Lives with wife and 2 children   Caffeine use: none    Family History  Problem Relation Age of Onset  . Hypertension Paternal Grandfather   . Hypertension Brother   . Diabetes Brother   . Neuropathy Neg Hx     ROS General: Negative; No fevers, chills, or night sweats HEENT: Negative; No changes in vision or hearing, sinus congestion, difficulty swallowing Pulmonary:  Positive for COPD Cardiovascular: See HPI: No chest pain, presyncope, syncope, palpatations GI: Negative; No nausea, vomiting, diarrhea, or abdominal pain GU: Negative; No dysuria, hematuria, or difficulty voiding Musculoskeletal: Negative; no myalgias, joint pain, or weakness Hematologic: Negative; no easy bruising, bleeding Endocrine: Positive for diabetes Neuro: Negative; no changes in balance, headaches Skin: Negative; No rashes or skin lesions Psychiatric: Negative; No behavioral problems, depression Sleep: Positive for  Severe OSA.  Recently confirmed on sleep study ; Did not adequately utilize CPAP.  Other comprehensive 14 point system review is negative   Physical Exam BP 120/62   Pulse 84   Ht 5\' 9"  (1.753 m)   Wt 265 lb (120.2 kg)   BMI 39.13 kg/m   Wt Readings from Last 3 Encounters:  11/28/15 266 lb (120.7 kg)  11/28/15 265 lb (120.2 kg)  11/27/15 266 lb (120.7 kg)   General: Alert, oriented, no distress.  Skin: normal turgor, no rashes, warm and dry HEENT: Normocephalic, atraumatic. Pupils equal round and reactive to light; sclera anicteric; extraocular muscles intact, No  lid lag; Nose without nasal septal hypertrophy; Mouth/Parynx benign; Mallinpatti scale 3 Neck: No JVD, no carotid bruits; normal carotid upstroke Lungs: clear to ausculatation and percussion bilaterally; no wheezing or rales, normal inspiratory and  expiratory effort Chest wall: without tenderness to palpitation Heart: PMI not displaced, RRR, s1 s2 normal, 1/6 systolic murmur, No diastolic murmur, no rubs, gallops, thrills, or heaves Abdomen: soft, nontender; no hepatosplenomehaly, BS+; abdominal aorta nontender and not dilated by palpation. Back: no CVA tenderness Pulses: 2+  Musculoskeletal:  Positive for bilateral carpal tunnel  symptoms Extremities: Pulses 2+, no clubbing cyanosis or edema, Homan's sign negative  Neurologic: grossly nonfocal; Cranial nerves grossly wnl Psychologic: Normal mood and affect  ECG (independently read by me): Normal sinus rhythm at 75 bpm.  Parenteral R wave progression.  Nonspecific ST-T changes laterally.  Normal intervals.  February 2017 ECG (independently read by me):  Normal sinus rhythm at 64 bpm.  Small Q wave in lead 3..  Normal intervals  July 2016 ECG (independently read by me): Normal sinus rhythm at 76 bpm.  Probably progression anteriorly.  No significant ST-T changes.  March 2016 ECG (independently read by me): Normal sinus rhythm at 70 bpm.  Mild T wave changes anterolaterally.  Q wave in lead 3.  Normal intervals.  ECG (independently read by me): Sinus bradycardia 57 bpm.  PR interval 186 ms.  Poor R wave progression.  LABS:  BMP Latest Ref Rng & Units 03/05/2015 06/08/2014 05/23/2014  Glucose 65 - 99 mg/dL 239(H) 180(H) 268(H)  BUN 6 - 24 mg/dL 18 10 25(H)  Creatinine 0.76 - 1.27 mg/dL 0.70(L) 0.70 0.98  BUN/Creat Ratio 9 - 20 26(H) - -  Sodium 134 - 144 mmol/L 135 134(L) 135  Potassium 3.5 - 5.2 mmol/L 5.1 4.4 4.8  Chloride 96 - 106 mmol/L 97 99 100  CO2 18 - 29 mmol/L 21 28 25   Calcium 8.7 - 10.2 mg/dL 9.1 8.4 8.9      Component Value  Date/Time   PROT 6.6 03/05/2015 1132   ALBUMIN 4.2 03/05/2015 1132   AST 19 03/05/2015 1132   ALT 9 03/05/2015 1132   ALKPHOS 84 03/05/2015 1132   BILITOT 0.7 03/05/2015 1132    CBC Latest Ref Rng & Units 06/08/2014 03/02/2014 11/14/2013  WBC 4.0 - 10.5 K/uL 10.0 11.5(A) 8.4  Hemoglobin 13.0 - 17.0 g/dL 13.5 13.5(A) 11.2(L)  Hematocrit 39.0 - 52.0 % 42.2 44.4 34.2(L)  Platelets 150 - 400 K/uL 243 - 321   Lab Results  Component Value Date   MCV 82.4 06/08/2014   MCV 78.4 (A) 03/02/2014   MCV 86.8 11/14/2013   Lab Results  Component Value Date   TSH 2.400 08/07/2014   Lab Results  Component Value Date   HGBA1C 10.6 01/25/2015    BNP No results found for: PROBNP  Lipid Panel     Component Value Date/Time   CHOL 134 01/25/2015 0845   TRIG 199 (H) 01/25/2015 0845   HDL 34 (L) 01/25/2015 0845   CHOLHDL 3.9 01/25/2015 0845   CHOLHDL 3.4 11/06/2013 0300   VLDL 39 11/06/2013 0300   LDLCALC 60 01/25/2015 0845     RADIOLOGY: No results found.    ASSESSMENT AND PLAN: Mr. Kalei Leonguerrero is a 50 year old gentleman with a cardiac risk factor profile notable for hypertension, diabetes mellitus, prior tobacco use, as well as previous use of cocaine. He was found to have severe three-vessel coronary obstructive disease at urgent cardiac catheterization and was treated with CABG surgery 6 in September 2015.  He is maintaining sinus rhythm and is without recurrent atrial fibrillation being off amiodarone.  He is not having any anginal symptomatology.  His blood pressure today is stable on  his lisinopril 40 mg, HCTZ 25 mg and carvedilol  12.5 mg twice a day.  He has been on atorvastatin for hyperlipidemia with LDL cholesterol at target , but apparently he was switched to pravastatin.  His  hemoglobin A1c was increased at 8.8 and he is now on metformin 1000 mg twice a day, insulin for his diabetes mellitus. I again reviewed his sleep study which demonstrated severe obstructive sleep apnea  with significant nocturnal oxygen desaturation to a nadir of 73%. On his CPAP titration trial he had very poor sleep efficiency and optimal CPAP pressure was not achieved.  Unfortunately, he had never initiated CPAP therapy.  He is now received Medicaid.  We have requested CPAP initiation, but unfortunately it is been over 6 months since his initial evaluations and as result per Medicaid requirements.  He must have another sleep evaluation.  I discussed with him need for another evaluation with a will not pay for his treatment.  He is agreed to undergo a mother evaluation to me their requirements.  This will be set up as soon as possible.  We contacted [facility] .  We will try to expedite CPAP initiation once his follow-up study is completed.  We discussed weight loss with his BMI of 39.13.  He has COPD, which also contributes to some exertional shortness of breath.  I will see him in sleep clinic for follow-up evaluation.  Time spent: 25 minutes  Troy Sine, MD, [facility]  12/01/2015 11:13 AM

## 2015-12-04 ENCOUNTER — Telehealth: Payer: Self-pay | Admitting: Family Medicine

## 2015-12-04 MED ORDER — OXYCODONE-ACETAMINOPHEN 5-325 MG PO TABS: 1.0000 | ORAL_TABLET | ORAL | 0 refills | Status: DC | PRN

## 2015-12-04 NOTE — Telephone Encounter (Signed)
Pt is requesting a refill on oxycodone  - he got #40 on 11/27/15. Was written for every 4 hours PRN.   Please advise

## 2015-12-04 NOTE — Procedures (Signed)
Patient Name: Shane Frye, Shane Frye Date: 11/28/2015 Gender: Male D.O.B: October 31, 1965 Age (years): 58 Referring Provider: Shelva Majestic MD, ABSM Height (inches): 69 Interpreting Physician: Shelva Majestic MD, ABSM Weight (lbs): 266 RPSGT: Zadie Rhine BMI: 39 MRN: [id number] Neck Size: 20.00  CLINICAL INFORMATION The patient is referred for a BiPAP titration to treat sleep apnea.   Date of NPSG, Split Night or HST:  03/11/2015: AHI 93.0/h; RDI 96.0;                                                              03/30/2015: AHI 10.7 at 10 cm water SLEEP STUDY TECHNIQUE As per the AASM Manual for the Scoring of Sleep and Associated Events v2.3 (April 2016) with a hypopnea requiring 4% desaturations. The channels recorded and monitored were frontal, central and occipital EEG, electrooculogram (EOG), submentalis EMG (chin), nasal and oral airflow, thoracic and abdominal wall motion, anterior tibialis EMG, snore microphone, electrocardiogram, and pulse oximetry. Bilevel positive airway pressure (BPAP) was initiated at the beginning of the study and titrated to treat sleep-disordered breathing.  MEDICATIONS aspirin 81 MG tablet carvedilol (COREG) 12.5 MG tablet citalopram (CELEXA) 20 MG tablet gabapentin (NEURONTIN) 300 MG capsule glucose blood test strip hydrochlorothiazide (HYDRODIURIL) 25 MG tablet HYDROcodone-acetaminophen (NORCO) 10-325 MG tablet insulin aspart protamine- aspart (NOVOLOG MIX 70/30) (70-30) 100 UNIT/ML injection lisinopril (PRINIVIL,ZESTRIL) 40 MG tablet metFORMIN (GLUCOPHAGE) 1000 MG tablet oxyCODONE-acetaminophen (ROXICET) 5-325 MG tablet pantoprazole (PROTONIX) 40 MG tablet pravastatin (PRAVACHOL) 40 MG tablet  RESPIRATORY PARAMETERS Optimal IPAP Pressure (cm): 22 AHI at Optimal Pressure (/hr) 0.0 Optimal EPAP Pressure (cm): 18   Overall Minimal O2 (%): 85.00 Minimal O2 at Optimal Pressure (%): 85.0  SLEEP ARCHITECTURE Start Time: 9:18:19 PM Stop  Time: 3:39:59 AM Total Time (min): 381.7 Total Sleep Time (min): 216.5 Sleep Latency (min): 103.1 Sleep Efficiency (%): 56.7 REM Latency (min): 124.0 WASO (min): 62.1 Stage N1 (%): 2.31 Stage N2 (%): 83.60 Stage N3 (%): 2.08 Stage R (%): 12.01 Supine (%): 63.31 Arousal Index (/hr): 13.9      CARDIAC DATA The 2 lead EKG demonstrated sinus rhythm. The mean heart rate was 62.85 beats per minute. Other EKG findings include: PVCs.  LEG MOVEMENT DATA The total Periodic Limb Movements of Sleep (PLMS) were 314. The PLMS index was 87.02. A PLMS index of <15 is considered normal in adults.  IMPRESSIONS - CPAP therapy was started at 9 cm and was titrated to 19 cm water pressure; due to continued events Bi-PAP was started at 21/17 and optimal pressure was 22/18 cm of water). AHI at 22/18 was 0.  - Central sleep apnea was not noted during this titration (CAI = 0.6/h). - Moderate oxygen desaturations were observed during this titration (min O2 = 85.00%). - No snoring was audible during this study. - 2-lead EKG demonstrated: PVCs - Severe periodic limb movements were observed during this study. Arousals associated with PLMs were rare.  DIAGNOSIS - Obstructive Sleep Apnea (327.23 [G47.33 ICD-10])  RECOMMENDATIONS - Recommend an initial trial of BiPAP therapy with Bi-Flex/EPR at 22/18 cm H2O with a Medium size Resmed Full Face Mask AirFit F20 mask and heated humidification. - Efforts should be made to optimize nasal and oropharyngeal patency. - Avoid alcohol, sedatives and other CNS depressants that may worsen sleep apnea  and disrupt normal sleep architecture. - Sleep hygiene should be reviewed to assess factors that may improve sleep quality. - Weight management (BMI 39) and regular exercise should be initiated or continued. - Recommend a download be obtained in 30 days and sleep clinic evaluation.  [Electronically signed] 12/04/2015 07:46 PM  Shelva Majestic MD, [facility], ABSM Diplomate, American Board of  Sleep Medicine   NPI: [id number]  [place] PH: [phone]   FX: [phone] [place]

## 2015-12-05 NOTE — Telephone Encounter (Signed)
Pt aware written Rx is at front desk ready for pickup  

## 2015-12-13 ENCOUNTER — Ambulatory Visit (INDEPENDENT_AMBULATORY_CARE_PROVIDER_SITE_OTHER): Payer: Medicaid Other | Admitting: Family Medicine

## 2015-12-13 ENCOUNTER — Encounter: Payer: Self-pay | Admitting: Family Medicine

## 2015-12-13 ENCOUNTER — Ambulatory Visit: Payer: Self-pay | Admitting: Family Medicine

## 2015-12-13 VITALS — BP 138/83 | HR 78 | Temp 97.0°F | Ht 69.0 in | Wt 264.0 lb

## 2015-12-13 DIAGNOSIS — M5116 Intervertebral disc disorders with radiculopathy, lumbar region: Secondary | ICD-10-CM | POA: Diagnosis not present

## 2015-12-13 MED ORDER — OXYCODONE-ACETAMINOPHEN 5-325 MG PO TABS: 1.0000 | ORAL_TABLET | ORAL | 0 refills | Status: DC | PRN

## 2015-12-13 NOTE — Progress Notes (Signed)
Subjective:    Patient ID: Shane Frye, male    DOB: [birth year], 50 y.o.   MRN: [id number]  HPI  Pt here for follow up and management of low back pain. He was referred to a neurosurgeon and his first appointment is next Thursday.  Patient has disc disease and is scheduled to see neurosurgery next week. He is requesting more pain pills before he sees a Publishing rights manager. He recently had hand surgery yesterday for trigger fingers. I have forms here in the office to complete for surgical clearance but I guess it really was not necessary.    Patient Active Problem List   Diagnosis Date Noted  . Lumbar disc disease with radiculopathy 11/27/2015  . OSA (obstructive sleep apnea) 07/21/2015  . Hypertension   . GERD (gastroesophageal reflux disease)   . Diabetes ([place])   . Insomnia   . Neuropathy ([place])   . Rotator cuff tear 01/30/2014  . Dyspnea on exertion 12/26/2013  . Smoker 12/26/2013  . PAF (paroxysmal atrial fibrillation) ([place]) 12/26/2013  . 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 STEMI (ST elevation myocardial infarction) ([place]) 11/04/2013  . HTN (hypertension) 11/04/2013  . DM (diabetes mellitus) ([place]) 11/04/2013  . HLD (hyperlipidemia) 11/04/2013  . Obesity 11/04/2013  . Cocaine abuse 11/04/2013  . H/O noncompliance with medical treatment, presenting hazards to health 11/04/2013  . CAD (coronary artery disease), native coronary artery 11/04/2013   Outpatient Encounter Prescriptions as of 12/13/2015  Medication Sig  . aspirin 81 MG tablet Take 81 mg by mouth daily.  . carvedilol (COREG) 12.5 MG tablet Take 1 tablet (12.5 mg total) by mouth 2 (two) times daily.  . citalopram (CELEXA) 20 MG tablet Take 1 tablet (20 mg total) by mouth daily.  Marland Kitchen gabapentin (NEURONTIN) 300 MG capsule Take 3 capsules (900 mg total) by mouth 3 (three) times daily. Slowly increase as discussed.  Marland Kitchen glucose blood test strip Use as instructed  . hydrochlorothiazide (HYDRODIURIL) 25 MG tablet Take 1 tablet (25 mg total)  by mouth daily.  . insulin aspart protamine- aspart (NOVOLOG MIX 70/30) (70-30) 100 UNIT/ML injection Inject 15 Units into the skin 3 (three) times daily.  . Insulin Syringe-Needle U-100 (RELION INSULIN SYR 0.3ML/31G) 31G X 5/16" 0.3 ML MISC 1 Syringe by Does not apply route 3 (three) times daily.  Marland Kitchen lisinopril (PRINIVIL,ZESTRIL) 40 MG tablet Take 1 tablet (40 mg total) by mouth daily.  . metFORMIN (GLUCOPHAGE) 1000 MG tablet TAKE ONE TABLET BY MOUTH TWICE DAILY WITH A  MEAL  . oxyCODONE-acetaminophen (ROXICET) 5-325 MG tablet Take 1 tablet by mouth every 4 (four) hours as needed for severe pain.  . pantoprazole (PROTONIX) 40 MG tablet TAKE ONE TABLET BY MOUTH ONCE DAILY  . pravastatin (PRAVACHOL) 40 MG tablet Take 1 tablet (40 mg total) by mouth daily.  . [DISCONTINUED] HYDROcodone-acetaminophen (NORCO) 10-325 MG tablet Take 1 tablet by mouth every 6 (six) hours as needed.   No facility-administered encounter medications on file as of 12/13/2015.      Review of Systems  Constitutional: Negative.   HENT: Negative.   Eyes: Negative.   Respiratory: Negative.   Cardiovascular: Negative.   Gastrointestinal: Negative.   Endocrine: Negative.   Genitourinary: Negative.   Musculoskeletal: Positive for back pain (low back and right hip pain).  Skin: Negative.   Allergic/Immunologic: Negative.   Neurological: Negative.   Hematological: Negative.   Psychiatric/Behavioral: Negative.        Objective:   Physical Exam  Constitutional: He appears  well-developed and well-nourished.  Cardiovascular: Normal rate and regular rhythm.   Pulmonary/Chest: Effort normal.  Musculoskeletal:  Reflexes are not perceptible in either leg at the knee or ankle   BP 138/83 (BP Location: Right Arm)   Pulse 78   Temp 97 F (36.1 C) (Oral)   Ht 5\' 9"  (1.753 m)   Wt 264 lb (119.7 kg)   BMI 38.99 kg/m         Assessment & Plan:   1. Lumbar disc disease with radiculopathy Refilled analgesic.  Encouraged to keep appointment with surgeon.

## 2015-12-20 ENCOUNTER — Other Ambulatory Visit: Payer: Self-pay | Admitting: Neurosurgery

## 2015-12-20 DIAGNOSIS — G8929 Other chronic pain: Secondary | ICD-10-CM

## 2015-12-20 DIAGNOSIS — M5441 Lumbago with sciatica, right side: Principal | ICD-10-CM

## 2015-12-27 ENCOUNTER — Ambulatory Visit (INDEPENDENT_AMBULATORY_CARE_PROVIDER_SITE_OTHER): Payer: Medicaid Other | Admitting: Family Medicine

## 2015-12-27 ENCOUNTER — Telehealth: Payer: Self-pay | Admitting: Family Medicine

## 2015-12-27 ENCOUNTER — Encounter: Payer: Self-pay | Admitting: Family Medicine

## 2015-12-27 VITALS — BP 163/93 | HR 71 | Temp 97.7°F | Ht 69.0 in | Wt 266.0 lb

## 2015-12-27 DIAGNOSIS — M5116 Intervertebral disc disorders with radiculopathy, lumbar region: Secondary | ICD-10-CM | POA: Diagnosis not present

## 2015-12-27 DIAGNOSIS — Z0289 Encounter for other administrative examinations: Secondary | ICD-10-CM

## 2015-12-27 DIAGNOSIS — Z23 Encounter for immunization: Secondary | ICD-10-CM | POA: Diagnosis not present

## 2015-12-27 MED ORDER — OXYCODONE-ACETAMINOPHEN 7.5-325 MG PO TABS: 1.0000 | ORAL_TABLET | ORAL | 0 refills | Status: DC | PRN

## 2015-12-27 NOTE — Progress Notes (Signed)
Subjective:    Patient ID: Shane Frye, male    DOB: 1965/06/01, 50 y.o.   MRN: [id number]  HPI 50 year old gentleman with chronic back pain. He saw a neurosurgeon last week who has him set up for MRI a week from yesterday. He had had previous CT at [facility] which showed some disc disease. Pain persists. It radiates down his right leg to the top of his foot. It's worse at night.  Patient Active Problem List   Diagnosis Date Noted  . Lumbar disc disease with radiculopathy 11/27/2015  . OSA (obstructive sleep apnea) 07/21/2015  . Hypertension   . GERD (gastroesophageal reflux disease)   . Diabetes ([place])   . Insomnia   . Neuropathy ([place])   . Rotator cuff tear 01/30/2014  . Dyspnea on exertion 12/26/2013  . Smoker 12/26/2013  . PAF (paroxysmal atrial fibrillation) ([place]) 12/26/2013  . 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 STEMI (ST elevation myocardial infarction) ([place]) 11/04/2013  . HTN (hypertension) 11/04/2013  . DM (diabetes mellitus) ([place]) 11/04/2013  . HLD (hyperlipidemia) 11/04/2013  . Obesity 11/04/2013  . Cocaine abuse 11/04/2013  . H/O noncompliance with medical treatment, presenting hazards to health 11/04/2013  . CAD (coronary artery disease), native coronary artery 11/04/2013   Outpatient Encounter Prescriptions as of 12/27/2015  Medication Sig  . aspirin 81 MG tablet Take 81 mg by mouth daily.  . carvedilol (COREG) 12.5 MG tablet Take 1 tablet (12.5 mg total) by mouth 2 (two) times daily.  . citalopram (CELEXA) 20 MG tablet Take 1 tablet (20 mg total) by mouth daily.  Marland Kitchen gabapentin (NEURONTIN) 300 MG capsule Take 3 capsules (900 mg total) by mouth 3 (three) times daily. Slowly increase as discussed.  Marland Kitchen glucose blood test strip Use as instructed  . hydrochlorothiazide (HYDRODIURIL) 25 MG tablet Take 1 tablet (25 mg total) by mouth daily.  . insulin aspart protamine- aspart (NOVOLOG MIX 70/30) (70-30) 100 UNIT/ML injection Inject 15 Units into the skin 3 (three) times daily.  .  Insulin Syringe-Needle U-100 (RELION INSULIN SYR 0.3ML/31G) 31G X 5/16" 0.3 ML MISC 1 Syringe by Does not apply route 3 (three) times daily.  Marland Kitchen lisinopril (PRINIVIL,ZESTRIL) 40 MG tablet Take 1 tablet (40 mg total) by mouth daily.  . metFORMIN (GLUCOPHAGE) 1000 MG tablet TAKE ONE TABLET BY MOUTH TWICE DAILY WITH A  MEAL  . oxyCODONE-acetaminophen (ROXICET) 5-325 MG tablet Take 1 tablet by mouth every 4 (four) hours as needed for severe pain.  . pantoprazole (PROTONIX) 40 MG tablet TAKE ONE TABLET BY MOUTH ONCE DAILY  . pravastatin (PRAVACHOL) 40 MG tablet Take 1 tablet (40 mg total) by mouth daily.   No facility-administered encounter medications on file as of 12/27/2015.       Review of Systems  Constitutional: Negative.   Respiratory: Negative.   Cardiovascular: Negative.   Musculoskeletal: Positive for back pain.  Psychiatric/Behavioral: Negative.        Objective:   Physical Exam  Constitutional: He is oriented to person, place, and time. He appears well-developed and well-nourished.  Cardiovascular: Normal rate.   Pulmonary/Chest: Effort normal.  Musculoskeletal:  Reflexes are depressed throughout. Patient unable to walk on heels but I'm not sure he really has foot drop.  Neurological: He is alert and oriented to person, place, and time.   BP (!) 163/93   Pulse 71   Temp 97.7 F (36.5 C) (Oral)   Ht 5\' 9"  (1.753 m)   Wt 266 lb (120.7 kg)  BMI 39.28 kg/m         Assessment & Plan:  1. Need for prophylactic vaccination and inoculation against single disease   2. Lumbar disc disease with radiculopathy Continue oxycodone for persistent pain. Hopefully MRI will demonstrate a lesion which can be fixed or will be amenable to physical therapy. Increase oxycodone to 7.5 mg every 4 hours as needed. Patient did sign drug contract today  Wardell Honour MD

## 2015-12-27 NOTE — Telephone Encounter (Signed)
Approved through Medicaid through 05/08/16 for Hydrocodone Acetminophen

## 2015-12-28 ENCOUNTER — Telehealth: Payer: Self-pay | Admitting: Family Medicine

## 2015-12-28 NOTE — Telephone Encounter (Signed)
Let's give him what insurance will cover; that will be better than nothing. I would use the same directions as we last use when he was given hydrocodone

## 2015-12-28 NOTE — Telephone Encounter (Signed)
Pt was ok when I talked to him this morning about starting the prior authorization on yesterday's prescription.

## 2015-12-28 NOTE — Telephone Encounter (Signed)
Insurance won't approve Oxycodone They did approve Hydrocodone Please advise

## 2015-12-28 NOTE — Telephone Encounter (Signed)
Prior authorization started through [employer] this morning for yesterdays written Rx of Oxyocodone-acetaminophen 7.5-325

## 2015-12-31 NOTE — Telephone Encounter (Signed)
BIPAP order with supporting documentation sent to [facility] for set up.

## 2016-01-01 ENCOUNTER — Ambulatory Visit: Payer: Medicaid Other | Admitting: Family Medicine

## 2016-01-02 ENCOUNTER — Ambulatory Visit
Admission: RE | Admit: 2016-01-02 | Discharge: 2016-01-02 | Disposition: A | Discharge status: Home / Self Care | Payer: Medicaid Other | Source: Ambulatory Visit | Attending: Neurosurgery | Admitting: Neurosurgery

## 2016-01-02 DIAGNOSIS — M5441 Lumbago with sciatica, right side: Principal | ICD-10-CM

## 2016-01-02 DIAGNOSIS — G8929 Other chronic pain: Secondary | ICD-10-CM

## 2016-01-29 ENCOUNTER — Telehealth: Payer: Self-pay | Admitting: Family Medicine

## 2016-01-29 ENCOUNTER — Other Ambulatory Visit: Payer: Self-pay | Admitting: Family Medicine

## 2016-01-29 MED ORDER — OXYCODONE-ACETAMINOPHEN 7.5-325 MG PO TABS: 1.0000 | ORAL_TABLET | ORAL | 0 refills | Status: DC | PRN

## 2016-01-29 NOTE — Progress Notes (Unsigned)
Rx refilled. Is been 1 month since he last received

## 2016-01-29 NOTE — Telephone Encounter (Signed)
Please advise 

## 2016-01-30 NOTE — Telephone Encounter (Signed)
We took care of this problem yesterday

## 2016-02-05 ENCOUNTER — Other Ambulatory Visit: Payer: Self-pay | Admitting: Neurosurgery

## 2016-02-05 DIAGNOSIS — M5441 Lumbago with sciatica, right side: Principal | ICD-10-CM

## 2016-02-05 DIAGNOSIS — G8929 Other chronic pain: Secondary | ICD-10-CM

## 2016-02-08 ENCOUNTER — Ambulatory Visit (INDEPENDENT_AMBULATORY_CARE_PROVIDER_SITE_OTHER): Payer: Medicaid Other | Admitting: Family Medicine

## 2016-02-08 ENCOUNTER — Encounter: Payer: Self-pay | Admitting: Family Medicine

## 2016-02-08 VITALS — BP 133/81 | HR 84 | Temp 97.2°F | Ht 69.0 in | Wt 267.2 lb

## 2016-02-08 DIAGNOSIS — E1151 Type 2 diabetes mellitus with diabetic peripheral angiopathy without gangrene: Secondary | ICD-10-CM

## 2016-02-08 DIAGNOSIS — E785 Hyperlipidemia, unspecified: Secondary | ICD-10-CM

## 2016-02-08 DIAGNOSIS — Z794 Long term (current) use of insulin: Secondary | ICD-10-CM | POA: Diagnosis not present

## 2016-02-08 DIAGNOSIS — M5116 Intervertebral disc disorders with radiculopathy, lumbar region: Secondary | ICD-10-CM | POA: Diagnosis not present

## 2016-02-08 LAB — BAYER DCA HB A1C WAIVED: HB A1C (BAYER DCA - WAIVED): 11.9 % — ABNORMAL HIGH (ref <7.0)

## 2016-02-08 MED ORDER — OXYCODONE-ACETAMINOPHEN 7.5-325 MG PO TABS: 1.0000 | ORAL_TABLET | ORAL | 0 refills | Status: DC | PRN

## 2016-02-08 NOTE — Progress Notes (Signed)
Subjective:    Patient ID: Shane Frye, male    DOB: 1965/03/20, 50 y.o.   MRN: [id number]  HPI Patient is here today for a follow up on his back pain. Patient is scheduled to have a injection in his back on 02/15/2016.  He brings in a his MRI which shows that he does have a small disc that accounts for his back and leg pain. He is scheduled for epidural injections beginning on 1229.   Regarding his diabetes, last A1c was 10.6, indicating poor control. Lipids were last assessed one year ago and need to be checked today. He does take statin   Review of Systems  Constitutional: Negative.   HENT: Negative.   Eyes: Negative.   Respiratory: Negative.   Cardiovascular: Negative.   Gastrointestinal: Negative.   Endocrine: Negative.   Genitourinary: Negative.   Musculoskeletal: Positive for back pain.  Skin: Negative.   Allergic/Immunologic: Negative.   Neurological: Negative.   Hematological: Negative.   Psychiatric/Behavioral: Negative.       Patient Active Problem List   Diagnosis Date Noted  . Pain management contract agreement 12/27/2015  . Lumbar disc disease with radiculopathy 11/27/2015  . OSA (obstructive sleep apnea) 07/21/2015  . Hypertension   . GERD (gastroesophageal reflux disease)   . Diabetes ([place])   . Insomnia   . Neuropathy ([place])   . Rotator cuff tear 01/30/2014  . Dyspnea on exertion 12/26/2013  . Smoker 12/26/2013  . PAF (paroxysmal atrial fibrillation) ([place]) 12/26/2013  . 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 STEMI (ST elevation myocardial infarction) ([place]) 11/04/2013  . HTN (hypertension) 11/04/2013  . DM (diabetes mellitus) ([place]) 11/04/2013  . HLD (hyperlipidemia) 11/04/2013  . Obesity 11/04/2013  . Cocaine abuse 11/04/2013  . H/O noncompliance with medical treatment, presenting hazards to health 11/04/2013  . CAD (coronary artery disease), native coronary artery 11/04/2013   Outpatient Encounter Prescriptions as of 02/08/2016  Medication Sig  .  aspirin 81 MG tablet Take 81 mg by mouth daily.  . carvedilol (COREG) 12.5 MG tablet Take 1 tablet (12.5 mg total) by mouth 2 (two) times daily.  . citalopram (CELEXA) 20 MG tablet Take 1 tablet (20 mg total) by mouth daily.  Marland Kitchen gabapentin (NEURONTIN) 300 MG capsule Take 3 capsules (900 mg total) by mouth 3 (three) times daily. Slowly increase as discussed.  Marland Kitchen glucose blood test strip Use as instructed  . hydrochlorothiazide (HYDRODIURIL) 25 MG tablet Take 1 tablet (25 mg total) by mouth daily.  . insulin aspart protamine- aspart (NOVOLOG MIX 70/30) (70-30) 100 UNIT/ML injection Inject 15 Units into the skin 3 (three) times daily.  . Insulin Syringe-Needle U-100 (RELION INSULIN SYR 0.3ML/31G) 31G X 5/16" 0.3 ML MISC 1 Syringe by Does not apply route 3 (three) times daily.  Marland Kitchen lisinopril (PRINIVIL,ZESTRIL) 40 MG tablet Take 1 tablet (40 mg total) by mouth daily.  . metFORMIN (GLUCOPHAGE) 1000 MG tablet TAKE ONE TABLET BY MOUTH TWICE DAILY WITH A  MEAL  . oxyCODONE-acetaminophen (PERCOCET) 7.5-325 MG tablet Take 1 tablet by mouth every 4 (four) hours as needed for severe pain.  . pantoprazole (PROTONIX) 40 MG tablet TAKE ONE TABLET BY MOUTH ONCE DAILY  . pravastatin (PRAVACHOL) 40 MG tablet Take 1 tablet (40 mg total) by mouth daily.   No facility-administered encounter medications on file as of 02/08/2016.        Objective:   Physical Exam  Constitutional: He is oriented to person, place, and time. He appears well-developed and  well-nourished.  Cardiovascular: Normal rate, regular rhythm and normal heart sounds.   Pulmonary/Chest: Effort normal and breath sounds normal.  Musculoskeletal:  There is tenderness in the lumbar spine to percussion  Neurological: He is alert and oriented to person, place, and time.  Psychiatric: He has a normal mood and affect. His behavior is normal.    BP 133/81   Pulse 84   Temp 97.2 F (36.2 C) (Oral)   Ht '5\' 9"'  (1.753 m)   Wt 267 lb 3.2 oz (121.2 kg)    BMI 39.46 kg/m        Assessment & Plan:  1. Type 2 diabetes mellitus with diabetic peripheral angiopathy without gangrene, with long-term current use of insulin (HCC) Hopefully A1c will show some improvement. Patient is on insulin as well as metformin - Bayer DCA Hb A1c Waived  2. Hyperlipidemia, unspecified hyperlipidemia type Lipids were at goal when last assessed one year ago - CMP14+EGFR - Lipid panel    4. Lumbar disc disease with radiculopathy Refill oxycodone. Hopefully epidural injections will help pain and we can discontinue narcotic  Wardell Honour MD

## 2016-02-08 NOTE — Patient Instructions (Signed)
Please continue medication as directed.  Make sure you keep appointment for your back injection.

## 2016-02-09 LAB — CMP14+EGFR
ALBUMIN: 4.1 g/dL (ref 3.5–5.5)
ALK PHOS: 72 IU/L (ref 39–117)
ALT: 17 IU/L (ref 0–44)
AST: 29 IU/L (ref 0–40)
Albumin/Globulin Ratio: 1.6 (ref 1.2–2.2)
BUN / CREAT RATIO: 15 (ref 9–20)
BUN: 13 mg/dL (ref 6–24)
Bilirubin Total: 0.8 mg/dL (ref 0.0–1.2)
CO2: 22 mmol/L (ref 18–29)
CREATININE: 0.84 mg/dL (ref 0.76–1.27)
Calcium: 9 mg/dL (ref 8.7–10.2)
Chloride: 91 mmol/L — ABNORMAL LOW (ref 96–106)
GFR calc Af Amer: 116 mL/min/{1.73_m2} (ref >59)
GFR calc non Af Amer: 101 mL/min/{1.73_m2} (ref >59)
GLUCOSE: 318 mg/dL — AB (ref 65–99)
Globulin, Total: 2.6 g/dL (ref 1.5–4.5)
Potassium: 5.1 mmol/L (ref 3.5–5.2)
Sodium: 134 mmol/L (ref 134–144)
Total Protein: 6.7 g/dL (ref 6.0–8.5)

## 2016-02-09 LAB — LIPID PANEL
CHOLESTEROL TOTAL: 159 mg/dL (ref 100–199)
Chol/HDL Ratio: 3.9 ratio units (ref 0.0–5.0)
HDL: 41 mg/dL (ref >39)
LDL CALC: 81 mg/dL (ref 0–99)
TRIGLYCERIDES: 184 mg/dL — AB (ref 0–149)
VLDL CHOLESTEROL CAL: 37 mg/dL (ref 5–40)

## 2016-02-13 ENCOUNTER — Other Ambulatory Visit: Payer: Self-pay | Admitting: Surgical

## 2016-02-15 ENCOUNTER — Ambulatory Visit
Admission: RE | Admit: 2016-02-15 | Discharge: 2016-02-15 | Disposition: A | Discharge status: Home / Self Care | Payer: Medicaid Other | Source: Ambulatory Visit | Attending: Neurosurgery | Admitting: Neurosurgery

## 2016-02-15 DIAGNOSIS — G8929 Other chronic pain: Secondary | ICD-10-CM

## 2016-02-15 DIAGNOSIS — M5441 Lumbago with sciatica, right side: Principal | ICD-10-CM

## 2016-02-15 MED ORDER — METHYLPREDNISOLONE ACETATE 40 MG/ML INJ SUSP (RADIOLOG
120.0000 mg | Freq: Once | INTRAMUSCULAR | Status: AC
  Administered 2016-02-15: 120 mg via EPIDURAL

## 2016-02-15 MED ORDER — IOPAMIDOL (ISOVUE-M 200) INJECTION 41%
1.0000 mL | Freq: Once | INTRAMUSCULAR | Status: AC
  Administered 2016-02-15: 1 mL via EPIDURAL

## 2016-02-15 NOTE — Discharge Instructions (Signed)

## 2016-03-13 ENCOUNTER — Ambulatory Visit: Payer: Medicaid Other | Admitting: Cardiovascular Disease

## 2016-03-19 ENCOUNTER — Other Ambulatory Visit: Payer: Self-pay | Admitting: Surgical

## 2016-03-20 ENCOUNTER — Other Ambulatory Visit: Payer: Self-pay | Admitting: Family Medicine

## 2016-03-25 ENCOUNTER — Other Ambulatory Visit: Payer: Self-pay | Admitting: Family Medicine

## 2016-03-28 ENCOUNTER — Emergency Department (HOSPITAL_COMMUNITY)
Admission: EM | Admit: 2016-03-28 | Discharge: 2016-03-28 | Disposition: A | Discharge status: Home / Self Care | Payer: Self-pay | Attending: Emergency Medicine | Admitting: Emergency Medicine

## 2016-03-28 ENCOUNTER — Emergency Department (HOSPITAL_COMMUNITY): Payer: Self-pay

## 2016-03-28 ENCOUNTER — Encounter (HOSPITAL_COMMUNITY): Payer: Self-pay

## 2016-03-28 DIAGNOSIS — J449 Chronic obstructive pulmonary disease, unspecified: Secondary | ICD-10-CM | POA: Insufficient documentation

## 2016-03-28 DIAGNOSIS — Z7982 Long term (current) use of aspirin: Secondary | ICD-10-CM | POA: Insufficient documentation

## 2016-03-28 DIAGNOSIS — E119 Type 2 diabetes mellitus without complications: Secondary | ICD-10-CM | POA: Insufficient documentation

## 2016-03-28 DIAGNOSIS — Z79899 Other long term (current) drug therapy: Secondary | ICD-10-CM | POA: Insufficient documentation

## 2016-03-28 DIAGNOSIS — I10 Essential (primary) hypertension: Secondary | ICD-10-CM | POA: Insufficient documentation

## 2016-03-28 DIAGNOSIS — M7021 Olecranon bursitis, right elbow: Secondary | ICD-10-CM | POA: Insufficient documentation

## 2016-03-28 DIAGNOSIS — Y939 Activity, unspecified: Secondary | ICD-10-CM | POA: Insufficient documentation

## 2016-03-28 DIAGNOSIS — I2510 Atherosclerotic heart disease of native coronary artery without angina pectoris: Secondary | ICD-10-CM | POA: Insufficient documentation

## 2016-03-28 DIAGNOSIS — Z87891 Personal history of nicotine dependence: Secondary | ICD-10-CM | POA: Insufficient documentation

## 2016-03-28 DIAGNOSIS — Z794 Long term (current) use of insulin: Secondary | ICD-10-CM | POA: Insufficient documentation

## 2016-03-28 MED ORDER — NAPROXEN 250 MG PO TABS
500.0000 mg | ORAL_TABLET | Freq: Once | ORAL | Status: AC
  Administered 2016-03-28: 500 mg via ORAL
  Filled 2016-03-28: qty 2

## 2016-03-28 MED ORDER — NAPROXEN 500 MG PO TABS: 500.0000 mg | ORAL_TABLET | Freq: Two times a day (BID) | ORAL | 0 refills | Status: DC

## 2016-03-28 MED ORDER — CEPHALEXIN 500 MG PO CAPS: 500.0000 mg | ORAL_CAPSULE | Freq: Four times a day (QID) | ORAL | 0 refills | Status: DC

## 2016-03-28 NOTE — Discharge Instructions (Signed)
In addition to the medicines prescribed,  warm compresses or warm water soak may help with pain and swelling.  Avoid pressure to the site.  You may continue your regular home pain medications.

## 2016-03-28 NOTE — ED Triage Notes (Signed)
Pt reports swelling in right elbow for one week. He went to [facility] hospital 3 days ago and dx with insect bite. No meds given. Conts to be swollen, red and warm

## 2016-03-28 NOTE — ED Provider Notes (Signed)
[facility] DEPT Provider Note   CSN: [id number] Arrival date & time: 03/28/16  [id number]     History   Chief Complaint Chief Complaint  Patient presents with  . Elbow Pain    HPI Shane Frye is a 51 y.o. male presenting with a one week history of pain and swelling to his right posterior elbow.  He denies injury or prior episodes of similar symptoms. He was seen for this several days ago at [facility] and was diagnosed with an insect bite.  It has not improved, and also has had no treatment.  He denies fevers, chills, nausea, vomiting.  The history is provided by the patient.    Past Medical History:  Diagnosis Date  . Anxiety   . COPD (chronic obstructive pulmonary disease) ([place])   . Depression   . Diabetes mellitus   . GERD (gastroesophageal reflux disease)   . High cholesterol   . Hypertension   . Insomnia   . Myocardial infarction 11/04/13  . Neuropathy [facility])     Patient Active Problem List   Diagnosis Date Noted  . Pain management contract agreement 12/27/2015  . Lumbar disc disease with radiculopathy 11/27/2015  . OSA (obstructive sleep apnea) 07/21/2015  . Hypertension   . GERD (gastroesophageal reflux disease)   . Diabetes ([place])   . Insomnia   . Neuropathy ([place])   . Rotator cuff tear 01/30/2014  . Dyspnea on exertion 12/26/2013  . Smoker 12/26/2013  . PAF (paroxysmal atrial fibrillation) ([place]) 12/26/2013  . 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 STEMI (ST elevation myocardial infarction) ([place]) 11/04/2013  . HTN (hypertension) 11/04/2013  . DM (diabetes mellitus) ([place]) 11/04/2013  . HLD (hyperlipidemia) 11/04/2013  . Obesity 11/04/2013  . Cocaine abuse 11/04/2013  . H/O noncompliance with medical treatment, presenting hazards to health 11/04/2013  . CAD (coronary artery disease), native coronary artery 11/04/2013    Past Surgical History:  Procedure Laterality Date  . CARDIOVERSION N/A 11/06/2013   Procedure: CARDIOVERSION;  Surgeon: Thayer Headings, MD;   Location: [facility];  Service: Cardiovascular;  Laterality: N/A;  . CARPAL TUNNEL RELEASE  04/2015  . COLONOSCOPY N/A 05/11/2014   Procedure: COLONOSCOPY;  Surgeon: Rogene Houston, MD;  [facility];  Service: Endoscopy;  Laterality: N/A;  930  . CORONARY ARTERY BYPASS GRAFT N/A 11/09/2013   Procedure: CORONARY ARTERY BYPASS GRAFTING (CABG);  Surgeon: Melrose Nakayama, MD;  Location: [facility];  Service: Open Heart Surgery;  Laterality: N/A;  . INTRAOPERATIVE TRANSESOPHAGEAL ECHOCARDIOGRAM N/A 11/09/2013   Procedure: INTRAOPERATIVE TRANSESOPHAGEAL ECHOCARDIOGRAM;  Surgeon: Melrose Nakayama, MD;  Location: [employer];  Service: Open Heart Surgery;  Laterality: N/A;  . LEFT HEART CATHETERIZATION WITH CORONARY ANGIOGRAM N/A 11/04/2013   Procedure: LEFT HEART CATHETERIZATION WITH CORONARY ANGIOGRAM;  Surgeon: Troy Sine, MD;  Location: [facility] CATH LAB;  Service: Cardiovascular;  Laterality: N/A;  . NO PAST SURGERIES         Home Medications    Prior to Admission medications   Medication Sig Start Date End Date Taking? Authorizing Provider  aspirin 81 MG tablet Take 81 mg by mouth daily.   Yes Historical Provider, MD  carvedilol (COREG) 12.5 MG tablet Take 1 tablet (12.5 mg total) by mouth 2 (two) times daily. 11/27/15  Yes Wardell Honour, MD  citalopram (CELEXA) 20 MG tablet Take 1 tablet (20 mg total) by mouth daily. 11/27/15  Yes Wardell Honour, MD  gabapentin (NEURONTIN) 300 MG capsule Take 3 capsules (  900 mg total) by mouth 3 (three) times daily. Slowly increase as discussed. Patient taking differently: Take 900 mg by mouth at bedtime. Slowly increase as discussed. 03/05/15  Yes Melvenia Beam, MD  glucose blood test strip Use as instructed 08/07/14  Yes Wardell Honour, MD  hydrochlorothiazide (HYDRODIURIL) 25 MG tablet Take 1 tablet (25 mg total) by mouth daily. 11/27/15  Yes Wardell Honour, MD  insulin aspart protamine- aspart (NOVOLOG MIX 70/30) (70-30) 100 UNIT/ML injection  Inject 15 Units into the skin 3 (three) times daily.   Yes Historical Provider, MD  lisinopril (PRINIVIL,ZESTRIL) 40 MG tablet Take 1 tablet (40 mg total) by mouth daily. 11/27/15  Yes Wardell Honour, MD  metFORMIN (GLUCOPHAGE) 1000 MG tablet TAKE ONE TABLET BY MOUTH TWICE DAILY WITH  A  MEAL 03/21/16  Yes Wardell Honour, MD  oxyCODONE-acetaminophen (PERCOCET) 7.5-325 MG tablet Take 1 tablet by mouth every 4 (four) hours as needed for severe pain. 02/08/16  Yes Wardell Honour, MD  pantoprazole (PROTONIX) 40 MG tablet TAKE ONE TABLET BY MOUTH ONCE DAILY 10/29/15  Yes Wardell Honour, MD  pravastatin (PRAVACHOL) 40 MG tablet Take 1 tablet (40 mg total) by mouth daily. 09/11/15  Yes Wardell Honour, MD  RELION INSULIN SYR 0.3ML/31G 31G X 5/16" 0.3 ML MISC USE ONE SYRINGE EACH TIME THREE TIMES DAILY 03/26/16  Yes Wardell Honour, MD  cephALEXin (KEFLEX) 500 MG capsule Take 1 capsule (500 mg total) by mouth 4 (four) times daily. 03/28/16   Evalee Jefferson, PA-C  naproxen (NAPROSYN) 500 MG tablet Take 1 tablet (500 mg total) by mouth 2 (two) times daily. 03/28/16   Evalee Jefferson, PA-C    Family History Family History  Problem Relation Age of Onset  . Hypertension Paternal Grandfather   . Hypertension Brother   . Diabetes Brother   . Neuropathy Neg Hx     Social History Social History  Substance Use Topics  . Smoking status: Former Smoker    Packs/day: 0.50    Types: Cigarettes    Quit date: 11/10/2013  . Smokeless tobacco: Former Systems developer    Quit date: 11/04/2013  . Alcohol use No     Allergies   Patient has no known allergies.   Review of Systems Review of Systems  Constitutional: Negative for fever.  Musculoskeletal: Positive for arthralgias. Negative for joint swelling and myalgias.  Skin: Positive for color change.  Neurological: Negative for weakness and numbness.     Physical Exam Updated Vital Signs BP 144/86 (BP Location: Left Arm)   Pulse 81   Temp 97.7 F (36.5 C) (Oral)    Resp 20   Ht 5\' 9"  (1.753 m)   Wt 117.9 kg   SpO2 98%   BMI 38.40 kg/m   Physical Exam  Constitutional: He appears well-developed and well-nourished.  HENT:  Head: Atraumatic.  Neck: Normal range of motion.  Cardiovascular: Normal rate.   Pulses equal bilaterally  Pulmonary/Chest: Effort normal.  Musculoskeletal: He exhibits tenderness.       Right elbow: Tenderness found. Olecranon process tenderness noted.  ttp right olecranon bursa.  Mild localized erythema with no significant increased warmth.  FROM of elbow joint without pain, except for increased pressure at bursa site.  Skin intact.   Neurological: He is alert. He has normal strength. He displays normal reflexes. No sensory deficit.  Skin: Skin is warm and dry.  Psychiatric: He has a normal mood and affect.     ED  Treatments / Results  Labs (all labs ordered are listed, but only abnormal results are displayed) Labs Reviewed - No data to display  EKG  EKG Interpretation None       Radiology Dg Elbow Complete Right  Result Date: 03/28/2016 CLINICAL DATA:  Severe RIGHT posterior elbow pain and swelling, no known injury, question spider bite, history diabetes mellitus, hypertension, coronary disease post MI, COPD EXAM: RIGHT ELBOW - COMPLETE 3+ VIEW COMPARISON:  None FINDINGS: Osseous mineralization normal for technique. Joint spaces preserved. Large olecranon spur with overlying soft tissue swelling. No acute fracture, dislocation, or bone destruction. No knee joint effusion. Few scattered rounded soft tissue calcifications question phleboliths. IMPRESSION: Large olecranon spur with overlying soft tissue swelling. No acute bony abnormalities. Electronically Signed   By: Lavonia Dana M.D.   On: 03/28/2016 09:54    Procedures Procedures (including critical care time)  Medications Ordered in ED Medications  naproxen (NAPROSYN) tablet 500 mg (not administered)     Initial Impression / Assessment and Plan / ED Course    I have reviewed the triage vital signs and the nursing notes.  Pertinent labs & imaging results that were available during my care of the patient were reviewed by me and considered in my medical decision making (see chart for details).     Pt with large right olecranon spur with exam c/w olecranon bursitis.  Doubt infection, pt has FROM of the elbow itself, no joint effusion on imaging.  Localized erythema with intact skin.  Prescribed naproxen, advised warm compresses, avoid pressure.  Will cover also with keflex but exam is not suggesting infected bursa at this time.  Advised f/u with pcp within one week, sooner for any worsened sx.  Final Clinical Impressions(s) / ED Diagnoses   Final diagnoses:  Olecranon bursitis of right elbow    New Prescriptions New Prescriptions   CEPHALEXIN (KEFLEX) 500 MG CAPSULE    Take 1 capsule (500 mg total) by mouth 4 (four) times daily.   NAPROXEN (NAPROSYN) 500 MG TABLET    Take 1 tablet (500 mg total) by mouth 2 (two) times daily.     Evalee Jefferson, PA-C 03/28/16 [place], DO 03/29/16 2140

## 2016-05-06 NOTE — Progress Notes (Signed)
Subjective:    Patient ID: Shane Frye, male    DOB: 1965/09/26, 51 y.o.   MRN: [id number]  HPI 51 year old gentleman with multiple medical problems including coronary disease sleep apnea polyneuropathy. He is followed here primarily for his diabetes hypertension and hyperlipidemia. Last A1c was elevated at 11.9. Lipids were at goal when last assessed in December 2017. He also has chronic back pain with radiculopathy. He received an injection in his back recently. Injection has not helped.  Patient Active Problem List   Diagnosis Date Noted  . Pain management contract agreement 12/27/2015  . Lumbar disc disease with radiculopathy 11/27/2015  . OSA (obstructive sleep apnea) 07/21/2015  . Hypertension   . GERD (gastroesophageal reflux disease)   . Diabetes ([place])   . Insomnia   . Neuropathy ([place])   . Rotator cuff tear 01/30/2014  . Dyspnea on exertion 12/26/2013  . Smoker 12/26/2013  . PAF (paroxysmal atrial fibrillation) ([place]) 12/26/2013  . 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 STEMI (ST elevation myocardial infarction) ([place]) 11/04/2013  . HTN (hypertension) 11/04/2013  . DM (diabetes mellitus) ([place]) 11/04/2013  . HLD (hyperlipidemia) 11/04/2013  . Obesity 11/04/2013  . Cocaine abuse 11/04/2013  . H/O noncompliance with medical treatment, presenting hazards to health 11/04/2013  . CAD (coronary artery disease), native coronary artery 11/04/2013   Outpatient Encounter Prescriptions as of 05/07/2016  Medication Sig  . aspirin 81 MG tablet Take 81 mg by mouth daily.  . carvedilol (COREG) 12.5 MG tablet Take 1 tablet (12.5 mg total) by mouth 2 (two) times daily.  . citalopram (CELEXA) 20 MG tablet Take 1 tablet (20 mg total) by mouth daily.  Marland Kitchen gabapentin (NEURONTIN) 300 MG capsule Take 3 capsules (900 mg total) by mouth 3 (three) times daily. Slowly increase as discussed. (Patient taking differently: Take 900 mg by mouth at bedtime. Slowly increase as discussed.)  . glucose blood test strip Use  as instructed  . hydrochlorothiazide (HYDRODIURIL) 25 MG tablet Take 1 tablet (25 mg total) by mouth daily.  . insulin aspart protamine- aspart (NOVOLOG MIX 70/30) (70-30) 100 UNIT/ML injection Inject 15 Units into the skin 3 (three) times daily.  Marland Kitchen lisinopril (PRINIVIL,ZESTRIL) 40 MG tablet Take 1 tablet (40 mg total) by mouth daily.  . metFORMIN (GLUCOPHAGE) 1000 MG tablet TAKE ONE TABLET BY MOUTH TWICE DAILY WITH  A  MEAL  . naproxen (NAPROSYN) 500 MG tablet Take 1 tablet (500 mg total) by mouth 2 (two) times daily.  Marland Kitchen oxyCODONE-acetaminophen (PERCOCET) 7.5-325 MG tablet Take 1 tablet by mouth every 4 (four) hours as needed for severe pain.  . pantoprazole (PROTONIX) 40 MG tablet TAKE ONE TABLET BY MOUTH ONCE DAILY  . pravastatin (PRAVACHOL) 40 MG tablet Take 1 tablet (40 mg total) by mouth daily.  Marland Kitchen RELION INSULIN SYR 0.3ML/31G 31G X 5/16" 0.3 ML MISC USE ONE SYRINGE EACH TIME THREE TIMES DAILY  . [DISCONTINUED] cephALEXin (KEFLEX) 500 MG capsule Take 1 capsule (500 mg total) by mouth 4 (four) times daily.   No facility-administered encounter medications on file as of 05/07/2016.       Review of Systems  Constitutional: Negative.   HENT: Positive for hearing loss.   Respiratory: Negative.   Cardiovascular: Positive for chest pain.  Musculoskeletal: Positive for back pain.  Neurological: Negative.   Psychiatric/Behavioral: Negative.        Objective:   Physical Exam  Constitutional: He is oriented to person, place, and time. He appears well-developed and well-nourished.  HENT:  Mouth/Throat: Oropharynx is clear and moist.  Cardiovascular: Normal rate, regular rhythm and normal heart sounds.   Pulmonary/Chest: Effort normal and breath sounds normal.  Neurological: He is alert and oriented to person, place, and time.  Psychiatric: He has a normal mood and affect.   BP 131/77   Pulse 79   Temp (!) 96.8 F (36 C) (Oral)   Ht 5\' 9"  (1.753 m)   Wt 271 lb (122.9 kg)   BMI 40.02  kg/m         Assessment & Plan:  1. Essential hypertension Blood pressure was 131/77 today. Current medicines include lisinopril and hydrochlorothiazide  2. Type 2 diabetes mellitus with diabetic peripheral angiopathy without gangrene, with long-term current use of insulin ([place]) He is on insulin as well as metformin. I think he does not watch his diet very closely. He has gained 11 pounds in the last month - Microalbumin / creatinine urine ratio - Bayer DCA Hb A1c Waived  3. Hyperlipidemia, unspecified hyperlipidemia type Lipids are at goal with LDL of 81.  Wardell Honour MD

## 2016-05-07 ENCOUNTER — Ambulatory Visit (INDEPENDENT_AMBULATORY_CARE_PROVIDER_SITE_OTHER): Payer: Medicare Other | Admitting: Family Medicine

## 2016-05-07 ENCOUNTER — Encounter: Payer: Self-pay | Admitting: Family Medicine

## 2016-05-07 VITALS — BP 131/77 | HR 79 | Temp 96.8°F | Ht 69.0 in | Wt 271.0 lb

## 2016-05-07 DIAGNOSIS — E785 Hyperlipidemia, unspecified: Secondary | ICD-10-CM | POA: Diagnosis not present

## 2016-05-07 DIAGNOSIS — Z794 Long term (current) use of insulin: Secondary | ICD-10-CM

## 2016-05-07 DIAGNOSIS — I10 Essential (primary) hypertension: Secondary | ICD-10-CM | POA: Diagnosis not present

## 2016-05-07 DIAGNOSIS — E1151 Type 2 diabetes mellitus with diabetic peripheral angiopathy without gangrene: Secondary | ICD-10-CM | POA: Diagnosis not present

## 2016-05-07 LAB — BAYER DCA HB A1C WAIVED: HB A1C: 12.1 % — AB (ref <7.0)

## 2016-05-07 MED ORDER — CARVEDILOL 12.5 MG PO TABS: 12.5000 mg | ORAL_TABLET | Freq: Two times a day (BID) | ORAL | 1 refills | Status: DC

## 2016-05-07 MED ORDER — HYDROCHLOROTHIAZIDE 25 MG PO TABS
25.0000 mg | ORAL_TABLET | Freq: Every day | ORAL | 1 refills | Status: DC
Start: 2016-05-07 — End: 2016-06-09

## 2016-05-07 MED ORDER — LISINOPRIL 40 MG PO TABS: 40.0000 mg | ORAL_TABLET | Freq: Every day | ORAL | 1 refills | Status: DC

## 2016-05-07 MED ORDER — CITALOPRAM HYDROBROMIDE 20 MG PO TABS: 20.0000 mg | ORAL_TABLET | Freq: Every day | ORAL | 1 refills | Status: DC

## 2016-05-07 MED ORDER — OXYCODONE-ACETAMINOPHEN 7.5-325 MG PO TABS: 1.0000 | ORAL_TABLET | ORAL | 0 refills | Status: DC | PRN

## 2016-05-08 LAB — MICROALBUMIN / CREATININE URINE RATIO
Creatinine, Urine: 32.2 mg/dL
Microalbumin, Urine: <3 ug/mL

## 2016-05-26 ENCOUNTER — Encounter: Payer: Self-pay | Admitting: *Deleted

## 2016-06-09 ENCOUNTER — Ambulatory Visit (INDEPENDENT_AMBULATORY_CARE_PROVIDER_SITE_OTHER): Payer: Medicare Other | Admitting: Physician Assistant

## 2016-06-09 VITALS — BP 132/77 | HR 80 | Temp 97.4°F | Ht 69.0 in | Wt 261.6 lb

## 2016-06-09 DIAGNOSIS — I2510 Atherosclerotic heart disease of native coronary artery without angina pectoris: Secondary | ICD-10-CM | POA: Diagnosis not present

## 2016-06-09 DIAGNOSIS — G629 Polyneuropathy, unspecified: Secondary | ICD-10-CM | POA: Diagnosis not present

## 2016-06-09 DIAGNOSIS — M5116 Intervertebral disc disorders with radiculopathy, lumbar region: Secondary | ICD-10-CM | POA: Diagnosis not present

## 2016-06-09 DIAGNOSIS — E119 Type 2 diabetes mellitus without complications: Secondary | ICD-10-CM

## 2016-06-09 DIAGNOSIS — I10 Essential (primary) hypertension: Secondary | ICD-10-CM | POA: Diagnosis not present

## 2016-06-09 MED ORDER — LISINOPRIL 20 MG PO TABS: 20.0000 mg | ORAL_TABLET | Freq: Two times a day (BID) | ORAL | 3 refills | Status: DC

## 2016-06-09 MED ORDER — LOVASTATIN 20 MG PO TABS: 40.0000 mg | ORAL_TABLET | Freq: Every day | ORAL | 3 refills | Status: DC

## 2016-06-09 MED ORDER — CITALOPRAM HYDROBROMIDE 20 MG PO TABS: 20.0000 mg | ORAL_TABLET | Freq: Every day | ORAL | 3 refills | Status: DC

## 2016-06-09 MED ORDER — OXYCODONE-ACETAMINOPHEN 7.5-325 MG PO TABS
1.0000 | ORAL_TABLET | ORAL | 0 refills | Status: DC | PRN
Start: 2016-06-09 — End: 2016-09-08

## 2016-06-09 MED ORDER — CARVEDILOL 12.5 MG PO TABS: 12.5000 mg | ORAL_TABLET | Freq: Two times a day (BID) | ORAL | 3 refills | Status: DC

## 2016-06-09 MED ORDER — HYDROCHLOROTHIAZIDE 25 MG PO TABS: 25.0000 mg | ORAL_TABLET | Freq: Every day | ORAL | 3 refills | Status: DC

## 2016-06-09 MED ORDER — OXYCODONE-ACETAMINOPHEN 7.5-325 MG PO TABS: 1.0000 | ORAL_TABLET | ORAL | 0 refills | Status: DC | PRN

## 2016-06-09 MED ORDER — METFORMIN HCL 1000 MG PO TABS: 1000.0000 mg | ORAL_TABLET | Freq: Two times a day (BID) | ORAL | 3 refills | Status: DC

## 2016-06-09 NOTE — Patient Instructions (Addendum)
Carbohydrate Counting for Diabetes Mellitus, Adult Carbohydrate counting is a method for keeping track of how many carbohydrates you eat. Eating carbohydrates naturally increases the amount of sugar (glucose) in the blood. Counting how many carbohydrates you eat helps keep your blood glucose within normal limits, which helps you manage your diabetes (diabetes mellitus). It is important to know how many carbohydrates you can safely have in each meal. This is different for every person. A diet and nutrition specialist (registered dietitian) can help you make a meal plan and calculate how many carbohydrates you should have at each meal and snack. Carbohydrates are found in the following foods:  Grains, such as breads and cereals.  Dried beans and soy products.  Starchy vegetables, such as potatoes, peas, and corn.  Fruit and fruit juices.  Milk and yogurt.  Sweets and snack foods, such as cake, cookies, candy, chips, and soft drinks. How do I count carbohydrates? There are two ways to count carbohydrates in food. You can use either of the methods or a combination of both. Reading "Nutrition Facts" on packaged food  The "Nutrition Facts" list is included on the labels of almost all packaged foods and beverages in the [place] It includes:  The serving size.  Information about nutrients in each serving, including the grams (g) of carbohydrate per serving. To use the "Nutrition Facts":  Decide how many servings you will have.  Multiply the number of servings by the number of carbohydrates per serving.  The resulting number is the total amount of carbohydrates that you will be having. Learning standard serving sizes of other foods  When you eat foods containing carbohydrates that are not packaged or do not include "Nutrition Facts" on the label, you need to measure the servings in order to count the amount of carbohydrates:  Measure the foods that you will eat with a food scale or measuring  cup, if needed.  Decide how many standard-size servings you will eat.  Multiply the number of servings by 15. Most carbohydrate-rich foods have about 15 g of carbohydrates per serving.  For example, if you eat 8 oz (170 g) of strawberries, you will have eaten 2 servings and 30 g of carbohydrates (2 servings x 15 g = 30 g).  For foods that have more than one food mixed, such as soups and casseroles, you must count the carbohydrates in each food that is included. The following list contains standard serving sizes of common carbohydrate-rich foods. Each of these servings has about 15 g of carbohydrates:   hamburger bun or  English muffin.   oz (15 mL) syrup.   oz (14 g) jelly.  1 slice of bread.  1 six-inch tortilla.  3 oz (85 g) cooked rice or pasta.  4 oz (113 g) cooked dried beans.  4 oz (113 g) starchy vegetable, such as peas, corn, or potatoes.  4 oz (113 g) hot cereal.  4 oz (113 g) mashed potatoes or  of a large baked potato.  4 oz (113 g) canned or frozen fruit.  4 oz (120 mL) fruit juice.  4-6 crackers.  6 chicken nuggets.  6 oz (170 g) unsweetened dry cereal.  6 oz (170 g) plain fat-free yogurt or yogurt sweetened with artificial sweeteners.  8 oz (240 mL) milk.  8 oz (170 g) fresh fruit or one small piece of fruit.  24 oz (680 g) popped popcorn. Example of carbohydrate counting Sample meal   3 oz (85 g) chicken breast.  6  oz (170 g) brown rice.  4 oz (113 g) corn.  8 oz (240 mL) milk.  8 oz (170 g) strawberries with sugar-free whipped topping. Carbohydrate calculation  1. Identify the foods that contain carbohydrates:  Rice.  Corn.  Milk.  Strawberries. 2. Calculate how many servings you have of each food:  2 servings rice.  1 serving corn.  1 serving milk.  1 serving strawberries. 3. Multiply each number of servings by 15 g:  2 servings rice x 15 g = 30 g.  1 serving corn x 15 g = 15 g.  1 serving milk x 15 g = 15  g.  1 serving strawberries x 15 g = 15 g. 4. Add together all of the amounts to find the total grams of carbohydrates eaten:  30 g + 15 g + 15 g + 15 g = 75 g of carbohydrates total. This information is not intended to replace advice given to you by your health care provider. Make sure you discuss any questions you have with your health care provider. Document Released: 02/03/2005 Document Revised: 08/24/2015 Document Reviewed: 07/18/2015 Elsevier Interactive Patient Education  2017 [employer]. Carb counting

## 2016-06-10 ENCOUNTER — Encounter: Payer: Self-pay | Admitting: Physician Assistant

## 2016-06-10 DIAGNOSIS — E119 Type 2 diabetes mellitus without complications: Secondary | ICD-10-CM | POA: Insufficient documentation

## 2016-06-10 NOTE — Progress Notes (Signed)
BP 132/77   Pulse 80   Temp 97.4 F (36.3 C) (Oral)   Ht 5\' 9"  (1.753 m)   Wt 261 lb 9.6 oz (118.7 kg)   BMI 38.63 kg/m    Subjective:    Patient ID: Shane Frye, male    DOB: 1965-11-11, 51 y.o.   MRN: [id number]  HPI: Shane Frye is a 52 y.o. male presenting on 06/09/2016 for Follow-up (1 mth Dr. Sabra Heck pt )  This patient comes in for periodic recheck on medications and conditions including CAD, NIDDM, HTN, DDD, neuropathy. This is been a patient of Dr. Ammie Ferrier for some time. He has long-standing degenerative disc disease. They're planning to do injections once he has insurance again. Currently he has no insurance at all. He is currently under contract and following his treatment appropriately.  Discussion about the amount of insulin he is using for his meals and how he should adjust this. I have given him information about carb counting..   All medications are reviewed today. There are no reports of any problems with the medications. All of the medical conditions are reviewed and updated.  Lab work is reviewed and will be ordered as medically necessary. There are no new problems reported with today's visit.   Relevant past medical, surgical, family and social history reviewed and updated as indicated. Allergies and medications reviewed and updated.  Past Medical History:  Diagnosis Date  . Anxiety   . COPD (chronic obstructive pulmonary disease) ([place])   . Depression   . Diabetes mellitus   . GERD (gastroesophageal reflux disease)   . High cholesterol   . Hypertension   . Insomnia   . Myocardial infarction 11/04/13  . Neuropathy [facility])     Past Surgical History:  Procedure Laterality Date  . CARDIOVERSION N/A 11/06/2013   Procedure: CARDIOVERSION;  Surgeon: Thayer Headings, MD;  Location: [place];  Service: Cardiovascular;  Laterality: N/A;  . CARPAL TUNNEL RELEASE  04/2015  . COLONOSCOPY N/A 05/11/2014   Procedure: COLONOSCOPY;  Surgeon: Rogene Houston, MD;   [facility];  Service: Endoscopy;  Laterality: N/A;  930  . CORONARY ARTERY BYPASS GRAFT N/A 11/09/2013   Procedure: CORONARY ARTERY BYPASS GRAFTING (CABG);  Surgeon: Melrose Nakayama, MD;  Location: [place];  Service: Open Heart Surgery;  Laterality: N/A;  . INTRAOPERATIVE TRANSESOPHAGEAL ECHOCARDIOGRAM N/A 11/09/2013   Procedure: INTRAOPERATIVE TRANSESOPHAGEAL ECHOCARDIOGRAM;  Surgeon: Melrose Nakayama, MD;  Location: [place];  Service: Open Heart Surgery;  Laterality: N/A;  . LEFT HEART CATHETERIZATION WITH CORONARY ANGIOGRAM N/A 11/04/2013   Procedure: LEFT HEART CATHETERIZATION WITH CORONARY ANGIOGRAM;  Surgeon: Troy Sine, MD;  Location: [facility] C[facility] LAB;  Service: Cardiovascular;  Laterality: N/A;  . NO PAST SURGERIES      Review of Systems  Constitutional: Negative for appetite change and fatigue.  HENT: Negative.   Eyes: Negative.  Negative for pain and visual disturbance.  Respiratory: Negative.  Negative for cough, chest tightness, shortness of breath and wheezing.   Cardiovascular: Negative.  Negative for chest pain, palpitations and leg swelling.  Gastrointestinal: Negative.  Negative for abdominal pain, diarrhea, nausea and vomiting.  Endocrine: Negative.   Genitourinary: Negative.   Musculoskeletal: Positive for arthralgias, back pain and gait problem.  Skin: Negative.  Negative for color change and rash.  Neurological: Positive for weakness. Negative for numbness and headaches.  Psychiatric/Behavioral: Negative.     Allergies as of 06/09/2016   No Known Allergies  Medication List       Accurate as of 06/09/16 11:59 PM. Always use your most recent med list.          aspirin 81 MG tablet Take 81 mg by mouth daily.   carvedilol 12.5 MG tablet Commonly known as:  COREG Take 1 tablet (12.5 mg total) by mouth 2 (two) times daily.   citalopram 20 MG tablet Commonly known as:  CELEXA Take 1 tablet (20 mg total) by mouth daily.   gabapentin 300 MG  capsule Commonly known as:  NEURONTIN Take 3 capsules (900 mg total) by mouth 3 (three) times daily. Slowly increase as discussed.   glucose blood test strip Use as instructed   hydrochlorothiazide 25 MG tablet Commonly known as:  HYDRODIURIL Take 1 tablet (25 mg total) by mouth daily.   insulin aspart protamine- aspart (70-30) 100 UNIT/ML injection Commonly known as:  NOVOLOG MIX 70/30 Inject 15-30 Units into the skin 3 (three) times daily.   lisinopril 20 MG tablet Commonly known as:  PRINIVIL,ZESTRIL Take 1 tablet (20 mg total) by mouth 2 (two) times daily.   lovastatin 20 MG tablet Commonly known as:  MEVACOR Take 2 tablets (40 mg total) by mouth at bedtime.   metFORMIN 1000 MG tablet Commonly known as:  GLUCOPHAGE Take 1 tablet (1,000 mg total) by mouth 2 (two) times daily with a meal.   naproxen 500 MG tablet Commonly known as:  NAPROSYN Take 1 tablet (500 mg total) by mouth 2 (two) times daily.   oxyCODONE-acetaminophen 7.5-325 MG tablet Commonly known as:  PERCOCET Take 1 tablet by mouth every 4 (four) hours as needed for severe pain.   oxyCODONE-acetaminophen 7.5-325 MG tablet Commonly known as:  PERCOCET Take 1 tablet by mouth every 4 (four) hours as needed for severe pain.   oxyCODONE-acetaminophen 7.5-325 MG tablet Commonly known as:  PERCOCET Take 1 tablet by mouth every 4 (four) hours as needed for severe pain.   pantoprazole 40 MG tablet Commonly known as:  PROTONIX TAKE ONE TABLET BY MOUTH ONCE DAILY   RELION INSULIN SYR 0.3ML/31G 31G X 5/16" 0.3 ML Misc Generic drug:  Insulin Syringe-Needle U-100 USE ONE SYRINGE EACH TIME THREE TIMES DAILY          Objective:    BP 132/77   Pulse 80   Temp 97.4 F (36.3 C) (Oral)   Ht 5\' 9"  (1.753 m)   Wt 261 lb 9.6 oz (118.7 kg)   BMI 38.63 kg/m   No Known Allergies  Physical Exam  Constitutional: He appears well-developed and well-nourished. No distress.  HENT:  Head: Normocephalic and  atraumatic.  Eyes: Conjunctivae and EOM are normal. Pupils are equal, round, and reactive to light.  Cardiovascular: Normal rate, regular rhythm and normal heart sounds.   Pulmonary/Chest: Effort normal and breath sounds normal. No respiratory distress.  Musculoskeletal:       Lumbar back: He exhibits decreased range of motion, tenderness, pain and spasm.  Neurological:  Reflex Scores:      Patellar reflexes are 1+ on the right side and 3+ on the left side. Skin: Skin is warm and dry.  Psychiatric: He has a normal mood and affect. His behavior is normal.  Nursing note and vitals reviewed.   Results for orders placed or performed in visit on 05/07/16  Microalbumin / creatinine urine ratio  Result Value Ref Range   Creatinine, Urine 32.2 Not Estab. mg/dL   Albumin, Urine <3.0 Not Estab. ug/mL   Microalb/Creat  Ratio <9.3 0.0 - 30.0 mg/g creat  Bayer DCA Hb A1c Waived  Result Value Ref Range   Bayer DCA Hb A1c Waived 12.1 (H) <7.0 %      Assessment & Plan:   1. Diabetes mellitus without complication (HCC) - metFORMIN (GLUCOPHAGE) 1000 MG tablet; Take 1 tablet (1,000 mg total) by mouth 2 (two) times daily with a meal.  Dispense: 180 tablet; Refill: 3  2. Essential hypertension - lisinopril (PRINIVIL,ZESTRIL) 20 MG tablet; Take 1 tablet (20 mg total) by mouth 2 (two) times daily.  Dispense: 180 tablet; Refill: 3 - carvedilol (COREG) 12.5 MG tablet; Take 1 tablet (12.5 mg total) by mouth 2 (two) times daily.  Dispense: 180 tablet; Refill: 3 - hydrochlorothiazide (HYDRODIURIL) 25 MG tablet; Take 1 tablet (25 mg total) by mouth daily.  Dispense: 90 tablet; Refill: 3  3. Lumbar disc disease with radiculopathy - oxyCODONE-acetaminophen (PERCOCET) 7.5-325 MG tablet; Take 1 tablet by mouth every 4 (four) hours as needed for severe pain.  Dispense: 180 tablet; Refill: 0 - oxyCODONE-acetaminophen (PERCOCET) 7.5-325 MG tablet; Take 1 tablet by mouth every 4 (four) hours as needed for severe  pain.  Dispense: 180 tablet; Refill: 0 - oxyCODONE-acetaminophen (PERCOCET) 7.5-325 MG tablet; Take 1 tablet by mouth every 4 (four) hours as needed for severe pain.  Dispense: 180 tablet; Refill: 0  4. Neuropathy - oxyCODONE-acetaminophen (PERCOCET) 7.5-325 MG tablet; Take 1 tablet by mouth every 4 (four) hours as needed for severe pain.  Dispense: 180 tablet; Refill: 0 - oxyCODONE-acetaminophen (PERCOCET) 7.5-325 MG tablet; Take 1 tablet by mouth every 4 (four) hours as needed for severe pain.  Dispense: 180 tablet; Refill: 0 - oxyCODONE-acetaminophen (PERCOCET) 7.5-325 MG tablet; Take 1 tablet by mouth every 4 (four) hours as needed for severe pain.  Dispense: 180 tablet; Refill: 0  5. Coronary artery disease involving native coronary artery of native heart without angina pectoris - lovastatin (MEVACOR) 20 MG tablet; Take 2 tablets (40 mg total) by mouth at bedtime.  Dispense: 180 tablet; Refill: 3   Current Outpatient Prescriptions:  .  aspirin 81 MG tablet, Take 81 mg by mouth daily., Disp: , Rfl:  .  carvedilol (COREG) 12.5 MG tablet, Take 1 tablet (12.5 mg total) by mouth 2 (two) times daily., Disp: 180 tablet, Rfl: 3 .  citalopram (CELEXA) 20 MG tablet, Take 1 tablet (20 mg total) by mouth daily., Disp: 90 tablet, Rfl: 3 .  gabapentin (NEURONTIN) 300 MG capsule, Take 3 capsules (900 mg total) by mouth 3 (three) times daily. Slowly increase as discussed. (Patient taking differently: Take 900 mg by mouth at bedtime. Slowly increase as discussed.), Disp: 810 capsule, Rfl: 3 .  glucose blood test strip, Use as instructed, Disp: 100 each, Rfl: 12 .  hydrochlorothiazide (HYDRODIURIL) 25 MG tablet, Take 1 tablet (25 mg total) by mouth daily., Disp: 90 tablet, Rfl: 3 .  insulin aspart protamine- aspart (NOVOLOG MIX 70/30) (70-30) 100 UNIT/ML injection, Inject 15-30 Units into the skin 3 (three) times daily., Disp: , Rfl:  .  lisinopril (PRINIVIL,ZESTRIL) 20 MG tablet, Take 1 tablet (20 mg total)  by mouth 2 (two) times daily., Disp: 180 tablet, Rfl: 3 .  lovastatin (MEVACOR) 20 MG tablet, Take 2 tablets (40 mg total) by mouth at bedtime., Disp: 180 tablet, Rfl: 3 .  metFORMIN (GLUCOPHAGE) 1000 MG tablet, Take 1 tablet (1,000 mg total) by mouth 2 (two) times daily with a meal., Disp: 180 tablet, Rfl: 3 .  naproxen (NAPROSYN) 500 MG  tablet, Take 1 tablet (500 mg total) by mouth 2 (two) times daily., Disp: 30 tablet, Rfl: 0 .  oxyCODONE-acetaminophen (PERCOCET) 7.5-325 MG tablet, Take 1 tablet by mouth every 4 (four) hours as needed for severe pain., Disp: 180 tablet, Rfl: 0 .  oxyCODONE-acetaminophen (PERCOCET) 7.5-325 MG tablet, Take 1 tablet by mouth every 4 (four) hours as needed for severe pain., Disp: 180 tablet, Rfl: 0 .  oxyCODONE-acetaminophen (PERCOCET) 7.5-325 MG tablet, Take 1 tablet by mouth every 4 (four) hours as needed for severe pain., Disp: 180 tablet, Rfl: 0 .  pantoprazole (PROTONIX) 40 MG tablet, TAKE ONE TABLET BY MOUTH ONCE DAILY, Disp: 90 tablet, Rfl: 0 .  RELION INSULIN SYR 0.3ML/31G 31G X 5/16" 0.3 ML MISC, USE ONE SYRINGE EACH TIME THREE TIMES DAILY, Disp: 100 each, Rfl: 2  Continue all other maintenance medications as listed above.  Follow up plan: Return in about 3 months (around 09/08/2016) for recheck.  Educational handout given for carb counting  Terald Sleeper PA-C [facility] [address]  [place], [place] [address] [phone]   06/10/2016, 8:35 AM

## 2016-07-22 ENCOUNTER — Telehealth: Payer: Self-pay | Admitting: Physician Assistant

## 2016-07-22 NOTE — Telephone Encounter (Signed)
Message left on voice mail for patient.    Patient must have a visit for his new pain medication scripts and then could possibly change pharmacies after discussing with provider .

## 2016-09-08 ENCOUNTER — Ambulatory Visit (INDEPENDENT_AMBULATORY_CARE_PROVIDER_SITE_OTHER): Payer: Medicare Other | Admitting: Physician Assistant

## 2016-09-08 ENCOUNTER — Encounter: Payer: Self-pay | Admitting: Physician Assistant

## 2016-09-08 DIAGNOSIS — M5116 Intervertebral disc disorders with radiculopathy, lumbar region: Secondary | ICD-10-CM | POA: Diagnosis not present

## 2016-09-08 DIAGNOSIS — G629 Polyneuropathy, unspecified: Secondary | ICD-10-CM

## 2016-09-08 DIAGNOSIS — E119 Type 2 diabetes mellitus without complications: Secondary | ICD-10-CM | POA: Diagnosis not present

## 2016-09-08 DIAGNOSIS — I2510 Atherosclerotic heart disease of native coronary artery without angina pectoris: Secondary | ICD-10-CM | POA: Diagnosis not present

## 2016-09-08 DIAGNOSIS — I10 Essential (primary) hypertension: Secondary | ICD-10-CM

## 2016-09-08 LAB — BAYER DCA HB A1C WAIVED: HB A1C (BAYER DCA - WAIVED): 8.7 % — ABNORMAL HIGH (ref <7.0)

## 2016-09-08 MED ORDER — GABAPENTIN 300 MG PO CAPS: 900.0000 mg | ORAL_CAPSULE | Freq: Three times a day (TID) | ORAL | 3 refills | Status: DC

## 2016-09-08 MED ORDER — LOVASTATIN 20 MG PO TABS: 40.0000 mg | ORAL_TABLET | Freq: Every day | ORAL | 3 refills | Status: DC

## 2016-09-08 MED ORDER — HYDROCHLOROTHIAZIDE 25 MG PO TABS: 25.0000 mg | ORAL_TABLET | Freq: Every day | ORAL | 3 refills | Status: DC

## 2016-09-08 MED ORDER — METFORMIN HCL 1000 MG PO TABS: 1000.0000 mg | ORAL_TABLET | Freq: Two times a day (BID) | ORAL | 3 refills | Status: DC

## 2016-09-08 MED ORDER — OXYCODONE-ACETAMINOPHEN 10-325 MG PO TABS: 1.0000 | ORAL_TABLET | ORAL | 0 refills | Status: DC | PRN

## 2016-09-08 MED ORDER — CITALOPRAM HYDROBROMIDE 20 MG PO TABS: 20.0000 mg | ORAL_TABLET | Freq: Every day | ORAL | 3 refills | Status: DC

## 2016-09-08 MED ORDER — LISINOPRIL 20 MG PO TABS: 20.0000 mg | ORAL_TABLET | Freq: Two times a day (BID) | ORAL | 3 refills | Status: DC

## 2016-09-08 MED ORDER — INSULIN ASPART PROT & ASPART (70-30 MIX) 100 UNIT/ML ~~LOC~~ SUSP: 15.0000 [IU] | Freq: Three times a day (TID) | SUBCUTANEOUS | 1 refills | Status: DC

## 2016-09-08 MED ORDER — "INSULIN SYRINGE-NEEDLE U-100 31G X 5/16"" 0.3 ML MISC": 2 refills | Status: DC

## 2016-09-08 MED ORDER — CARVEDILOL 12.5 MG PO TABS: 12.5000 mg | ORAL_TABLET | Freq: Two times a day (BID) | ORAL | 3 refills | Status: DC

## 2016-09-08 MED ORDER — PANTOPRAZOLE SODIUM 40 MG PO TBEC: 40.0000 mg | DELAYED_RELEASE_TABLET | Freq: Every day | ORAL | 3 refills | Status: DC

## 2016-09-08 NOTE — Progress Notes (Signed)
BP 137/85   Pulse 74   Temp (!) 97.1 F (36.2 C) (Oral)   Ht 5\' 9"  (1.753 m)   Wt 249 lb 9.6 oz (113.2 kg)   BMI 36.86 kg/m    Subjective:    Patient ID: Shane Frye, male    DOB: 10/12/1965, 51 y.o.   MRN: [id number]  HPI: Shane Frye is a 51 y.o. male presenting on 09/08/2016 for Follow-up (3 month ); Diabetes; and Back Pain  This patient comes in for periodic recheck on medications and conditions including Degenerative disc disease with neuropathy, hypertension, coronary artery disease, diabetes, hyperlipidemia.  He comes in for 3 month check. Since he was here last he has been approved for his disability. He anticipates starting to draw check on this next month. He does not have insurance still. He has been have to self-pay for medications. We have discussed the possibility of going to a private pharmacy to have much lower cost on several of his medications. And I told him that they will match the [facility] price. He feels that his back is having a pretty bad flareup. He is very uncomfortable sitting for longer lying down. When he walks around slowly it seems to give the most relief. He has done a very good job of losing 22 pounds since his last visit here. We anticipate a great improvement with his A1c.    All medications are reviewed today. There are no reports of any problems with the medications. All of the medical conditions are reviewed and updated.  Lab work is reviewed and will be ordered as medically necessary. There are no new problems reported with today's visit.   Relevant past medical, surgical, family and social history reviewed and updated as indicated. Allergies and medications reviewed and updated.  Past Medical History:  Diagnosis Date  . Anxiety   . COPD (chronic obstructive pulmonary disease) ([facility])   . Depression   . Diabetes mellitus   . GERD (gastroesophageal reflux disease)   . High cholesterol   . Hypertension   . Insomnia   . Myocardial infarction  ([facility]) 11/04/13  . Neuropathy     Past Surgical History:  Procedure Laterality Date  . CARDIOVERSION N/A 11/06/2013   Procedure: CARDIOVERSION;  Surgeon: Thayer Headings, MD;  Location: [facility];  Service: Cardiovascular;  Laterality: N/A;  . CARPAL TUNNEL RELEASE  04/2015  . COLONOSCOPY N/A 05/11/2014   Procedure: COLONOSCOPY;  Surgeon: Rogene Houston, MD;  [facility];  Service: Endoscopy;  Laterality: N/A;  930  . CORONARY ARTERY BYPASS GRAFT N/A 11/09/2013   Procedure: CORONARY ARTERY BYPASS GRAFTING (CABG);  Surgeon: Melrose Nakayama, MD;  Location: [facility];  Service: Open Heart Surgery;  Laterality: N/A;  . INTRAOPERATIVE TRANSESOPHAGEAL ECHOCARDIOGRAM N/A 11/09/2013   Procedure: INTRAOPERATIVE TRANSESOPHAGEAL ECHOCARDIOGRAM;  Surgeon: Melrose Nakayama, MD;  Location: [facility];  Service: Open Heart Surgery;  Laterality: N/A;  . LEFT HEART CATHETERIZATION WITH CORONARY ANGIOGRAM N/A 11/04/2013   Procedure: LEFT HEART CATHETERIZATION WITH CORONARY ANGIOGRAM;  Surgeon: Troy Sine, MD;  Location: [facility] CATH LAB;  Service: Cardiovascular;  Laterality: N/A;  . NO PAST SURGERIES      Review of Systems  Constitutional: Negative.  Negative for appetite change and fatigue.  Eyes: Negative for pain and visual disturbance.  Respiratory: Negative.  Negative for cough, chest tightness, shortness of breath and wheezing.   Cardiovascular: Negative.  Negative for chest pain, palpitations and leg swelling.  Gastrointestinal: Negative.  Negative for abdominal pain, diarrhea, nausea and vomiting.  Genitourinary: Negative.   Musculoskeletal: Positive for arthralgias and back pain.  Skin: Negative.  Negative for color change and rash.  Neurological: Negative.  Negative for weakness, numbness and headaches.  Psychiatric/Behavioral: Negative.     Allergies as of 09/08/2016   No Known Allergies     Medication List       Accurate as of 09/08/16 12:30 PM. Always use your most recent med list.           aspirin 81 MG tablet Take 81 mg by mouth daily.   carvedilol 12.5 MG tablet Commonly known as:  COREG Take 1 tablet (12.5 mg total) by mouth 2 (two) times daily.   citalopram 20 MG tablet Commonly known as:  CELEXA Take 1 tablet (20 mg total) by mouth daily.   gabapentin 300 MG capsule Commonly known as:  NEURONTIN Take 3 capsules (900 mg total) by mouth 3 (three) times daily. Slowly increase as discussed.   glucose blood test strip Use as instructed   hydrochlorothiazide 25 MG tablet Commonly known as:  HYDRODIURIL Take 1 tablet (25 mg total) by mouth daily.   insulin aspart protamine- aspart (70-30) 100 UNIT/ML injection Commonly known as:  NOVOLOG MIX 70/30 Inject 0.15-0.3 mLs (15-30 Units total) into the skin 3 (three) times daily.   Insulin Syringe-Needle U-100 31G X 5/16" 0.3 ML Misc Commonly known as:  RELION INSULIN SYR 0.3ML/31G USE ONE SYRINGE EACH TIME THREE TIMES DAILY   lisinopril 20 MG tablet Commonly known as:  PRINIVIL,ZESTRIL Take 1 tablet (20 mg total) by mouth 2 (two) times daily.   lovastatin 20 MG tablet Commonly known as:  MEVACOR Take 2 tablets (40 mg total) by mouth at bedtime.   metFORMIN 1000 MG tablet Commonly known as:  GLUCOPHAGE Take 1 tablet (1,000 mg total) by mouth 2 (two) times daily with a meal.   oxyCODONE-acetaminophen 10-325 MG tablet Commonly known as:  PERCOCET Take 1 tablet by mouth every 4 (four) hours as needed for pain. CHRONIC PAIN   oxyCODONE-acetaminophen 10-325 MG tablet Commonly known as:  PERCOCET Take 1 tablet by mouth every 4 (four) hours as needed for pain. CHRONIC PAIN   oxyCODONE-acetaminophen 10-325 MG tablet Commonly known as:  PERCOCET Take 1 tablet by mouth every 4 (four) hours as needed for pain. CHRONIC PAIN   pantoprazole 40 MG tablet Commonly known as:  PROTONIX Take 1 tablet (40 mg total) by mouth daily.          Objective:    BP 137/85   Pulse 74   Temp (!) 97.1 F (36.2  C) (Oral)   Ht 5\' 9"  (1.753 m)   Wt 249 lb 9.6 oz (113.2 kg)   BMI 36.86 kg/m   No Known Allergies  Physical Exam  Constitutional: He appears well-developed and well-nourished. No distress.  HENT:  Head: Normocephalic and atraumatic.  Eyes: Pupils are equal, round, and reactive to light. Conjunctivae and EOM are normal.  Cardiovascular: Normal rate, regular rhythm and normal heart sounds.   Pulmonary/Chest: Effort normal and breath sounds normal. No respiratory distress.  Musculoskeletal:       Lumbar back: He exhibits decreased range of motion, tenderness, deformity, pain and spasm.  Skin: Skin is warm and dry.  Psychiatric: He has a normal mood and affect. His behavior is normal.  Nursing note and vitals reviewed.   Results for orders placed or performed in visit on 09/08/16  Bayer DCA Hb A1c  Waived  Result Value Ref Range   Bayer DCA Hb A1c Waived 8.7 (H) <7.0 %      Assessment & Plan:   1. Lumbar disc disease with radiculopathy - oxyCODONE-acetaminophen (PERCOCET) 10-325 MG tablet; Take 1 tablet by mouth every 4 (four) hours as needed for pain. CHRONIC PAIN  Dispense: 180 tablet; Refill: 0 - oxyCODONE-acetaminophen (PERCOCET) 10-325 MG tablet; Take 1 tablet by mouth every 4 (four) hours as needed for pain. CHRONIC PAIN  Dispense: 180 tablet; Refill: 0 - oxyCODONE-acetaminophen (PERCOCET) 10-325 MG tablet; Take 1 tablet by mouth every 4 (four) hours as needed for pain. CHRONIC PAIN  Dispense: 180 tablet; Refill: 0  2. Neuropathy - gabapentin (NEURONTIN) 300 MG capsule; Take 3 capsules (900 mg total) by mouth 3 (three) times daily. Slowly increase as discussed.  Dispense: 810 capsule; Refill: 3  3. Essential hypertension - carvedilol (COREG) 12.5 MG tablet; Take 1 tablet (12.5 mg total) by mouth 2 (two) times daily.  Dispense: 180 tablet; Refill: 3 - hydrochlorothiazide (HYDRODIURIL) 25 MG tablet; Take 1 tablet (25 mg total) by mouth daily.  Dispense: 90 tablet; Refill: 3 -  lisinopril (PRINIVIL,ZESTRIL) 20 MG tablet; Take 1 tablet (20 mg total) by mouth 2 (two) times daily.  Dispense: 180 tablet; Refill: 3  4. Coronary artery disease involving native coronary artery of native heart without angina pectoris - lovastatin (MEVACOR) 20 MG tablet; Take 2 tablets (40 mg total) by mouth at bedtime.  Dispense: 180 tablet; Refill: 3  5. Diabetes mellitus without complication (HCC) - insulin aspart protamine- aspart (NOVOLOG MIX 70/30) (70-30) 100 UNIT/ML injection; Inject 0.15-0.3 mLs (15-30 Units total) into the skin 3 (three) times daily.  Dispense: 30 mL; Refill: 1 - metFORMIN (GLUCOPHAGE) 1000 MG tablet; Take 1 tablet (1,000 mg total) by mouth 2 (two) times daily with a meal.  Dispense: 180 tablet; Refill: 3 - Bayer DCA Hb A1c Waived - Insulin Syringe-Needle U-100 (RELION INSULIN SYR 0.3ML/31G) 31G X 5/16" 0.3 ML MISC; USE ONE SYRINGE EACH TIME THREE TIMES DAILY  Dispense: 100 each; Refill: 2   Current Outpatient Prescriptions:  .  aspirin 81 MG tablet, Take 81 mg by mouth daily., Disp: , Rfl:  .  carvedilol (COREG) 12.5 MG tablet, Take 1 tablet (12.5 mg total) by mouth 2 (two) times daily., Disp: 180 tablet, Rfl: 3 .  citalopram (CELEXA) 20 MG tablet, Take 1 tablet (20 mg total) by mouth daily., Disp: 90 tablet, Rfl: 3 .  gabapentin (NEURONTIN) 300 MG capsule, Take 3 capsules (900 mg total) by mouth 3 (three) times daily. Slowly increase as discussed., Disp: 810 capsule, Rfl: 3 .  glucose blood test strip, Use as instructed, Disp: 100 each, Rfl: 12 .  hydrochlorothiazide (HYDRODIURIL) 25 MG tablet, Take 1 tablet (25 mg total) by mouth daily., Disp: 90 tablet, Rfl: 3 .  insulin aspart protamine- aspart (NOVOLOG MIX 70/30) (70-30) 100 UNIT/ML injection, Inject 0.15-0.3 mLs (15-30 Units total) into the skin 3 (three) times daily., Disp: 30 mL, Rfl: 1 .  Insulin Syringe-Needle U-100 (RELION INSULIN SYR 0.3ML/31G) 31G X 5/16" 0.3 ML MISC, USE ONE SYRINGE EACH TIME THREE TIMES  DAILY, Disp: 100 each, Rfl: 2 .  lisinopril (PRINIVIL,ZESTRIL) 20 MG tablet, Take 1 tablet (20 mg total) by mouth 2 (two) times daily., Disp: 180 tablet, Rfl: 3 .  lovastatin (MEVACOR) 20 MG tablet, Take 2 tablets (40 mg total) by mouth at bedtime., Disp: 180 tablet, Rfl: 3 .  metFORMIN (GLUCOPHAGE) 1000 MG tablet, Take 1  tablet (1,000 mg total) by mouth 2 (two) times daily with a meal., Disp: 180 tablet, Rfl: 3 .  pantoprazole (PROTONIX) 40 MG tablet, Take 1 tablet (40 mg total) by mouth daily., Disp: 90 tablet, Rfl: 3 .  oxyCODONE-acetaminophen (PERCOCET) 10-325 MG tablet, Take 1 tablet by mouth every 4 (four) hours as needed for pain. CHRONIC PAIN, Disp: 180 tablet, Rfl: 0 .  oxyCODONE-acetaminophen (PERCOCET) 10-325 MG tablet, Take 1 tablet by mouth every 4 (four) hours as needed for pain. CHRONIC PAIN, Disp: 180 tablet, Rfl: 0 .  oxyCODONE-acetaminophen (PERCOCET) 10-325 MG tablet, Take 1 tablet by mouth every 4 (four) hours as needed for pain. CHRONIC PAIN, Disp: 180 tablet, Rfl: 0  Continue all other maintenance medications as listed above.  Follow up plan: Return in about 3 months (around 12/09/2016) for recheck.  Educational handout given for [place] PA-C [place] [address]  [place], [place] [address] [phone]   09/08/2016, 12:30 PM

## 2016-09-08 NOTE — Patient Instructions (Signed)
In a few days you may receive a survey in the mail or online from [employer] regarding your visit with us today. Please take a moment to fill this out. Your feedback is very important to our whole office. It can help us better understand your needs as well as improve your experience and satisfaction. Thank you for taking your time to complete it. We care about you.  Chayce Rullo, PA-C  

## 2016-11-07 ENCOUNTER — Telehealth: Payer: Self-pay | Admitting: Physician Assistant

## 2016-11-07 NOTE — Telephone Encounter (Signed)
This is okay to approve.

## 2016-11-07 NOTE — Telephone Encounter (Signed)
Patient aware that pharmacy was notified that he is able to pick up early due to family emergency.

## 2016-12-04 ENCOUNTER — Telehealth: Payer: Self-pay | Admitting: Physician Assistant

## 2016-12-04 DIAGNOSIS — M5116 Intervertebral disc disorders with radiculopathy, lumbar region: Secondary | ICD-10-CM

## 2016-12-05 MED ORDER — OXYCODONE-ACETAMINOPHEN 10-325 MG PO TABS: 1.0000 | ORAL_TABLET | ORAL | 0 refills | Status: DC | PRN

## 2016-12-05 NOTE — Telephone Encounter (Signed)
Patient here now to pick up

## 2016-12-09 ENCOUNTER — Ambulatory Visit (INDEPENDENT_AMBULATORY_CARE_PROVIDER_SITE_OTHER): Payer: Medicare Other | Admitting: Physician Assistant

## 2016-12-09 ENCOUNTER — Encounter: Payer: Self-pay | Admitting: Physician Assistant

## 2016-12-09 VITALS — BP 138/80 | HR 83 | Temp 97.1°F | Ht 69.0 in | Wt 246.6 lb

## 2016-12-09 DIAGNOSIS — E119 Type 2 diabetes mellitus without complications: Secondary | ICD-10-CM | POA: Diagnosis not present

## 2016-12-09 DIAGNOSIS — G629 Polyneuropathy, unspecified: Secondary | ICD-10-CM

## 2016-12-09 DIAGNOSIS — J0110 Acute frontal sinusitis, unspecified: Secondary | ICD-10-CM | POA: Diagnosis not present

## 2016-12-09 DIAGNOSIS — I10 Essential (primary) hypertension: Secondary | ICD-10-CM | POA: Diagnosis not present

## 2016-12-09 DIAGNOSIS — M5116 Intervertebral disc disorders with radiculopathy, lumbar region: Secondary | ICD-10-CM

## 2016-12-09 DIAGNOSIS — I2510 Atherosclerotic heart disease of native coronary artery without angina pectoris: Secondary | ICD-10-CM

## 2016-12-09 MED ORDER — OXYCODONE-ACETAMINOPHEN 10-325 MG PO TABS: 1.0000 | ORAL_TABLET | ORAL | 0 refills | Status: DC | PRN

## 2016-12-09 MED ORDER — CITALOPRAM HYDROBROMIDE 20 MG PO TABS: 20.0000 mg | ORAL_TABLET | Freq: Every day | ORAL | 3 refills | Status: DC

## 2016-12-09 MED ORDER — METFORMIN HCL 1000 MG PO TABS: 1000.0000 mg | ORAL_TABLET | Freq: Two times a day (BID) | ORAL | 3 refills | Status: DC

## 2016-12-09 MED ORDER — LISINOPRIL 20 MG PO TABS: 20.0000 mg | ORAL_TABLET | Freq: Two times a day (BID) | ORAL | 3 refills | Status: DC

## 2016-12-09 MED ORDER — DOXYCYCLINE HYCLATE 100 MG PO TABS: 100.0000 mg | ORAL_TABLET | Freq: Two times a day (BID) | ORAL | 0 refills | Status: DC

## 2016-12-09 MED ORDER — CARVEDILOL 12.5 MG PO TABS: 12.5000 mg | ORAL_TABLET | Freq: Two times a day (BID) | ORAL | 3 refills | Status: DC

## 2016-12-09 MED ORDER — PANTOPRAZOLE SODIUM 40 MG PO TBEC: 40.0000 mg | DELAYED_RELEASE_TABLET | Freq: Every day | ORAL | 3 refills | Status: DC

## 2016-12-09 MED ORDER — GABAPENTIN 300 MG PO CAPS: 900.0000 mg | ORAL_CAPSULE | Freq: Three times a day (TID) | ORAL | 3 refills | Status: DC

## 2016-12-09 MED ORDER — HYDROCHLOROTHIAZIDE 25 MG PO TABS: 25.0000 mg | ORAL_TABLET | Freq: Every day | ORAL | 3 refills | Status: DC

## 2016-12-09 MED ORDER — LOVASTATIN 20 MG PO TABS: 40.0000 mg | ORAL_TABLET | Freq: Every day | ORAL | 3 refills | Status: DC

## 2016-12-09 MED ORDER — METHYLPREDNISOLONE ACETATE 80 MG/ML IJ SUSP
80.0000 mg | Freq: Once | INTRAMUSCULAR | Status: AC
  Administered 2016-12-09: 80 mg via INTRAMUSCULAR

## 2016-12-09 NOTE — Patient Instructions (Signed)
In a few days you may receive a survey in the mail or online from [employer] regarding your visit with us today. Please take a moment to fill this out. Your feedback is very important to our whole office. It can help us better understand your needs as well as improve your experience and satisfaction. Thank you for taking your time to complete it. We care about you.  Edel Rivero, PA-C  

## 2016-12-09 NOTE — Progress Notes (Signed)
BP 138/80   Pulse 83   Temp (!) 97.1 F (36.2 C) (Oral)   Ht 5\' 9"  (1.753 m)   Wt 246 lb 9.6 oz (111.9 kg)   BMI 36.42 kg/m    Subjective:    Patient ID: Shane Frye, male    DOB: [birth year], 51 y.o.   MRN: [id number]  HPI: COSTANTINO KOHLBECK is a 51 y.o. male presenting on 12/09/2016 for Follow-up (3 month ); Diabetes; Hypertension; and Hyperlipidemia  This patient comes in for periodic recheck on medications and conditions including hypertension, degenerative disc disease with neuropathy, diabetes, coronary artery disease.  Overall he is doing well with his medications.  He is taking 7030 insulin 3 times a day 20 units.  We will draw an A1c today to see how his control is.  All the other numbers appear normal.    He is also having a lot of bronchial congestion and sinus pressure.  He tends to get these infections each year. This patient has had many days of sinus headache and postnasal drainage. There is copious drainage at times. Denies any fever at this time. There has been a history of sinus infections in the past.  No history of sinus surgery. There is cough at night. It has become more prevalent in recent days.  All medications are reviewed today. There are no reports of any problems with the medications. All of the medical conditions are reviewed and updated.  Lab work is reviewed and will be ordered as medically necessary. There are no new problems reported with today's visit.  Relevant past medical, surgical, family and social history reviewed and updated as indicated. Allergies and medications reviewed and updated.  Past Medical History:  Diagnosis Date  . Anxiety   . COPD (chronic obstructive pulmonary disease) ([place])   . Depression   . Diabetes mellitus   . GERD (gastroesophageal reflux disease)   . High cholesterol   . Hypertension   . Insomnia   . Myocardial infarction ([facility]) 11/04/13  . Neuropathy     Past Surgical History:  Procedure Laterality Date  .  CARDIOVERSION N/A 11/06/2013   Procedure: CARDIOVERSION;  Surgeon: Thayer Headings, MD;  Location: [facility];  Service: Cardiovascular;  Laterality: N/A;  . CARPAL TUNNEL RELEASE  04/2015  . COLONOSCOPY N/A 05/11/2014   Procedure: COLONOSCOPY;  Surgeon: Rogene Houston, MD;  [facility];  Service: Endoscopy;  Laterality: N/A;  930  . CORONARY ARTERY BYPASS GRAFT N/A 11/09/2013   Procedure: CORONARY ARTERY BYPASS GRAFTING (CABG);  Surgeon: Melrose Nakayama, MD;  Location: [facility];  Service: Open Heart Surgery;  Laterality: N/A;  . INTRAOPERATIVE TRANSESOPHAGEAL ECHOCARDIOGRAM N/A 11/09/2013   Procedure: INTRAOPERATIVE TRANSESOPHAGEAL ECHOCARDIOGRAM;  Surgeon: Melrose Nakayama, MD;  Location: [facility];  Service: Open Heart Surgery;  Laterality: N/A;  . LEFT HEART CATHETERIZATION WITH CORONARY ANGIOGRAM N/A 11/04/2013   Procedure: LEFT HEART CATHETERIZATION WITH CORONARY ANGIOGRAM;  Surgeon: Troy Sine, MD;  Location: [facility] CATH LAB;  Service: Cardiovascular;  Laterality: N/A;  . NO PAST SURGERIES      Review of Systems  Constitutional: Positive for fatigue. Negative for appetite change.  HENT: Positive for sinus pressure and sore throat.   Eyes: Negative.  Negative for pain and visual disturbance.  Respiratory: Positive for shortness of breath and wheezing. Negative for cough and chest tightness.   Cardiovascular: Negative.  Negative for chest pain, palpitations and leg swelling.  Gastrointestinal: Negative.  Negative for abdominal pain,  diarrhea, nausea and vomiting.  Endocrine: Negative.   Genitourinary: Negative.   Musculoskeletal: Positive for back pain and myalgias.  Skin: Negative.  Negative for color change and rash.  Neurological: Positive for headaches. Negative for weakness and numbness.  Psychiatric/Behavioral: Negative.     Allergies as of 12/09/2016   No Known Allergies     Medication List       Accurate as of 12/09/16 10:54 AM. Always use your most recent med  list.          aspirin 81 MG tablet Take 81 mg by mouth daily.   carvedilol 12.5 MG tablet Commonly known as:  COREG Take 1 tablet (12.5 mg total) by mouth 2 (two) times daily.   citalopram 20 MG tablet Commonly known as:  CELEXA Take 1 tablet (20 mg total) by mouth daily.   doxycycline 100 MG tablet Commonly known as:  VIBRA-TABS Take 1 tablet (100 mg total) by mouth 2 (two) times daily. 1 po bid   gabapentin 300 MG capsule Commonly known as:  NEURONTIN Take 3 capsules (900 mg total) by mouth 3 (three) times daily. Slowly increase as discussed.   glucose blood test strip Use as instructed   hydrochlorothiazide 25 MG tablet Commonly known as:  HYDRODIURIL Take 1 tablet (25 mg total) by mouth daily.   insulin aspart protamine- aspart (70-30) 100 UNIT/ML injection Commonly known as:  NOVOLOG MIX 70/30 Inject 0.15-0.3 mLs (15-30 Units total) into the skin 3 (three) times daily.   Insulin Syringe-Needle U-100 31G X 5/16" 0.3 ML Misc Commonly known as:  RELION INSULIN SYR 0.3ML/31G USE ONE SYRINGE EACH TIME THREE TIMES DAILY   lisinopril 20 MG tablet Commonly known as:  PRINIVIL,ZESTRIL Take 1 tablet (20 mg total) by mouth 2 (two) times daily.   lovastatin 20 MG tablet Commonly known as:  MEVACOR Take 2 tablets (40 mg total) by mouth at bedtime.   metFORMIN 1000 MG tablet Commonly known as:  GLUCOPHAGE Take 1 tablet (1,000 mg total) by mouth 2 (two) times daily with a meal.   oxyCODONE-acetaminophen 10-325 MG tablet Commonly known as:  PERCOCET Take 1 tablet by mouth every 4 (four) hours as needed for pain. CHRONIC PAIN   oxyCODONE-acetaminophen 10-325 MG tablet Commonly known as:  PERCOCET Take 1 tablet by mouth every 4 (four) hours as needed for pain. CHRONIC PAIN   oxyCODONE-acetaminophen 10-325 MG tablet Commonly known as:  PERCOCET Take 1 tablet by mouth every 4 (four) hours as needed for pain. CHRONIC PAIN   pantoprazole 40 MG tablet Commonly known as:   PROTONIX Take 1 tablet (40 mg total) by mouth daily.          Objective:    BP 138/80   Pulse 83   Temp (!) 97.1 F (36.2 C) (Oral)   Ht 5\' 9"  (1.753 m)   Wt 246 lb 9.6 oz (111.9 kg)   BMI 36.42 kg/m   No Known Allergies  Physical Exam  Constitutional: He is oriented to person, place, and time. He appears well-developed and well-nourished.  HENT:  Head: Normocephalic and atraumatic.  Right Ear: Tympanic membrane and external ear normal. No middle ear effusion.  Left Ear: Tympanic membrane and external ear normal.  No middle ear effusion.  Nose: Mucosal edema and rhinorrhea present. Right sinus exhibits no maxillary sinus tenderness. Left sinus exhibits no maxillary sinus tenderness.  Mouth/Throat: Uvula is midline. Posterior oropharyngeal erythema present.  Eyes: Pupils are equal, round, and reactive to light. Conjunctivae and  EOM are normal. Right eye exhibits no discharge. Left eye exhibits no discharge.  Neck: Normal range of motion.  Cardiovascular: Normal rate, regular rhythm and normal heart sounds.   Pulmonary/Chest: Effort normal and breath sounds normal. No respiratory distress. He has no wheezes.  Abdominal: Soft.  Musculoskeletal:       Lumbar back: He exhibits tenderness, pain and spasm.  Lymphadenopathy:    He has no cervical adenopathy.  Neurological: He is alert and oriented to person, place, and time.  Skin: Skin is warm and dry.  Psychiatric: He has a normal mood and affect.    Results for orders placed or performed in visit on 09/08/16  Bayer DCA Hb A1c Waived  Result Value Ref Range   Bayer DCA Hb A1c Waived 8.7 (H) <7.0 %      Assessment & Plan:   1. Essential hypertension - lisinopril (PRINIVIL,ZESTRIL) 20 MG tablet; Take 1 tablet (20 mg total) by mouth 2 (two) times daily.  Dispense: 180 tablet; Refill: 3 - carvedilol (COREG) 12.5 MG tablet; Take 1 tablet (12.5 mg total) by mouth 2 (two) times daily.  Dispense: 180 tablet; Refill: 3 -  hydrochlorothiazide (HYDRODIURIL) 25 MG tablet; Take 1 tablet (25 mg total) by mouth daily.  Dispense: 90 tablet; Refill: 3  2. Lumbar disc disease with radiculopathy - oxyCODONE-acetaminophen (PERCOCET) 10-325 MG tablet; Take 1 tablet by mouth every 4 (four) hours as needed for pain. CHRONIC PAIN  Dispense: 180 tablet; Refill: 0 - oxyCODONE-acetaminophen (PERCOCET) 10-325 MG tablet; Take 1 tablet by mouth every 4 (four) hours as needed for pain. CHRONIC PAIN  Dispense: 180 tablet; Refill: 0 - oxyCODONE-acetaminophen (PERCOCET) 10-325 MG tablet; Take 1 tablet by mouth every 4 (four) hours as needed for pain. CHRONIC PAIN  Dispense: 180 tablet; Refill: 0 - methylPREDNISolone acetate (DEPO-MEDROL) injection 80 mg; Inject 1 mL (80 mg total) into the muscle once.  3. Neuropathy - gabapentin (NEURONTIN) 300 MG capsule; Take 3 capsules (900 mg total) by mouth 3 (three) times daily. Slowly increase as discussed.  Dispense: 810 capsule; Refill: 3  4. Diabetes mellitus without complication (HCC) - metFORMIN (GLUCOPHAGE) 1000 MG tablet; Take 1 tablet (1,000 mg total) by mouth 2 (two) times daily with a meal.  Dispense: 180 tablet; Refill: 3  5. Coronary artery disease involving native coronary artery of native heart without angina pectoris - lovastatin (MEVACOR) 20 MG tablet; Take 2 tablets (40 mg total) by mouth at bedtime.  Dispense: 180 tablet; Refill: 3    Current Outpatient Prescriptions:  .  aspirin 81 MG tablet, Take 81 mg by mouth daily., Disp: , Rfl:  .  carvedilol (COREG) 12.5 MG tablet, Take 1 tablet (12.5 mg total) by mouth 2 (two) times daily., Disp: 180 tablet, Rfl: 3 .  citalopram (CELEXA) 20 MG tablet, Take 1 tablet (20 mg total) by mouth daily., Disp: 90 tablet, Rfl: 3 .  gabapentin (NEURONTIN) 300 MG capsule, Take 3 capsules (900 mg total) by mouth 3 (three) times daily. Slowly increase as discussed., Disp: 810 capsule, Rfl: 3 .  glucose blood test strip, Use as instructed, Disp: 100  each, Rfl: 12 .  hydrochlorothiazide (HYDRODIURIL) 25 MG tablet, Take 1 tablet (25 mg total) by mouth daily., Disp: 90 tablet, Rfl: 3 .  insulin aspart protamine- aspart (NOVOLOG MIX 70/30) (70-30) 100 UNIT/ML injection, Inject 0.15-0.3 mLs (15-30 Units total) into the skin 3 (three) times daily., Disp: 30 mL, Rfl: 1 .  Insulin Syringe-Needle U-100 (RELION INSULIN SYR 0.3ML/31G) 31G  X 5/16" 0.3 ML MISC, USE ONE SYRINGE EACH TIME THREE TIMES DAILY, Disp: 100 each, Rfl: 2 .  lisinopril (PRINIVIL,ZESTRIL) 20 MG tablet, Take 1 tablet (20 mg total) by mouth 2 (two) times daily., Disp: 180 tablet, Rfl: 3 .  lovastatin (MEVACOR) 20 MG tablet, Take 2 tablets (40 mg total) by mouth at bedtime., Disp: 180 tablet, Rfl: 3 .  metFORMIN (GLUCOPHAGE) 1000 MG tablet, Take 1 tablet (1,000 mg total) by mouth 2 (two) times daily with a meal., Disp: 180 tablet, Rfl: 3 .  oxyCODONE-acetaminophen (PERCOCET) 10-325 MG tablet, Take 1 tablet by mouth every 4 (four) hours as needed for pain. CHRONIC PAIN, Disp: 180 tablet, Rfl: 0 .  oxyCODONE-acetaminophen (PERCOCET) 10-325 MG tablet, Take 1 tablet by mouth every 4 (four) hours as needed for pain. CHRONIC PAIN, Disp: 180 tablet, Rfl: 0 .  oxyCODONE-acetaminophen (PERCOCET) 10-325 MG tablet, Take 1 tablet by mouth every 4 (four) hours as needed for pain. CHRONIC PAIN, Disp: 180 tablet, Rfl: 0 .  pantoprazole (PROTONIX) 40 MG tablet, Take 1 tablet (40 mg total) by mouth daily., Disp: 90 tablet, Rfl: 3 .  doxycycline (VIBRA-TABS) 100 MG tablet, Take 1 tablet (100 mg total) by mouth 2 (two) times daily. 1 po bid, Disp: 20 tablet, Rfl: 0 Continue all other maintenance medications as listed above.  Follow up plan: Return in about 3 months (around 03/11/2017) for recheck.  Educational handout given for [place] PA-C [place] [address]  [place], [place] [address] [phone]   12/09/2016, 10:54 AM

## 2017-01-18 IMAGING — XA Imaging study
1 series · 1 of 1 positions shown · non-contrast
Comparison: none

CLINICAL DATA: Chronic right-sided low back pain. Right lower
extremity radiculitis. Right-sided sciatica. Displacement of the
L4-5 lumbar disc.

[Series 1: ortho standard · 1 of 1 slices shown]
[im 1/1]
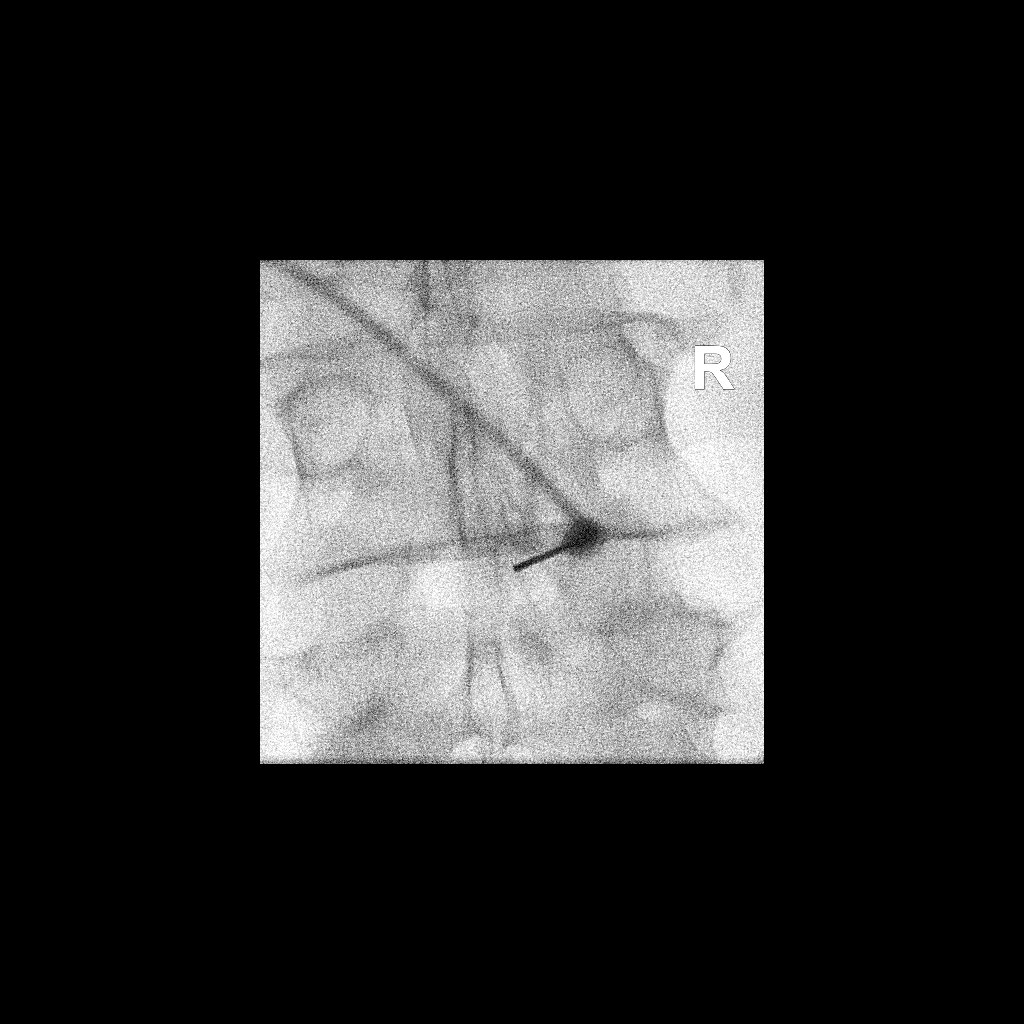

[1 of 1 positions shown; findings below may reference images not displayed]

FLUOROSCOPY TIME:  Radiation Exposure Index (as provided by the
fluoroscopic device): 17.88 uGy*m2

Fluoroscopy Time:  21 seconds.

Number of Acquired Images:  0

PROCEDURE:
The procedure, risks, benefits, and alternatives were explained to
the patient. Questions regarding the procedure were encouraged and
answered. The patient understands and consents to the procedure.

LUMBAR EPIDURAL INJECTION:

An interlaminar approach was performed on right at L4-5. The
overlying skin was cleansed and anesthetized. A 20 gauge epidural
needle was advanced using loss-of-resistance technique.

DIAGNOSTIC EPIDURAL INJECTION:

Injection of Isovue-M 200 shows a good epidural pattern with spread
above and below the level of needle placement, primarily on the
right no vascular opacification is seen.

THERAPEUTIC EPIDURAL INJECTION:

120 Mg of Depo-Medrol mixed with 3 mL 1% lidocaine were instilled.
The procedure was well-tolerated, and the patient was discharged
thirty minutes following the injection in good condition.

COMPLICATIONS:
None
IMPRESSION: Technically successful epidural injection on the right L4-5 # 1

## 2017-01-30 DIAGNOSIS — I10 Essential (primary) hypertension: Secondary | ICD-10-CM | POA: Diagnosis not present

## 2017-01-30 DIAGNOSIS — H6692 Otitis media, unspecified, left ear: Secondary | ICD-10-CM | POA: Diagnosis not present

## 2017-01-30 DIAGNOSIS — E119 Type 2 diabetes mellitus without complications: Secondary | ICD-10-CM | POA: Diagnosis not present

## 2017-01-30 DIAGNOSIS — H9312 Tinnitus, left ear: Secondary | ICD-10-CM | POA: Diagnosis not present

## 2017-01-30 DIAGNOSIS — Z951 Presence of aortocoronary bypass graft: Secondary | ICD-10-CM | POA: Diagnosis not present

## 2017-01-30 DIAGNOSIS — F329 Major depressive disorder, single episode, unspecified: Secondary | ICD-10-CM | POA: Diagnosis not present

## 2017-01-30 DIAGNOSIS — Z833 Family history of diabetes mellitus: Secondary | ICD-10-CM | POA: Diagnosis not present

## 2017-01-30 DIAGNOSIS — Z79899 Other long term (current) drug therapy: Secondary | ICD-10-CM | POA: Diagnosis not present

## 2017-01-30 DIAGNOSIS — I2510 Atherosclerotic heart disease of native coronary artery without angina pectoris: Secondary | ICD-10-CM | POA: Diagnosis not present

## 2017-01-30 DIAGNOSIS — K219 Gastro-esophageal reflux disease without esophagitis: Secondary | ICD-10-CM | POA: Diagnosis not present

## 2017-01-30 DIAGNOSIS — Z87891 Personal history of nicotine dependence: Secondary | ICD-10-CM | POA: Diagnosis not present

## 2017-01-30 DIAGNOSIS — Z794 Long term (current) use of insulin: Secondary | ICD-10-CM | POA: Diagnosis not present

## 2017-01-30 DIAGNOSIS — Z8249 Family history of ischemic heart disease and other diseases of the circulatory system: Secondary | ICD-10-CM | POA: Diagnosis not present

## 2017-01-30 DIAGNOSIS — I252 Old myocardial infarction: Secondary | ICD-10-CM | POA: Diagnosis not present

## 2017-03-11 ENCOUNTER — Encounter: Payer: Self-pay | Admitting: Physician Assistant

## 2017-03-11 ENCOUNTER — Ambulatory Visit (INDEPENDENT_AMBULATORY_CARE_PROVIDER_SITE_OTHER): Payer: Medicare Other | Admitting: Physician Assistant

## 2017-03-11 VITALS — BP 139/66 | HR 85 | Temp 98.3°F | Ht 69.0 in | Wt 238.8 lb

## 2017-03-11 DIAGNOSIS — I10 Essential (primary) hypertension: Secondary | ICD-10-CM | POA: Diagnosis not present

## 2017-03-11 DIAGNOSIS — Z794 Long term (current) use of insulin: Secondary | ICD-10-CM

## 2017-03-11 DIAGNOSIS — Z0289 Encounter for other administrative examinations: Secondary | ICD-10-CM

## 2017-03-11 DIAGNOSIS — I2510 Atherosclerotic heart disease of native coronary artery without angina pectoris: Secondary | ICD-10-CM | POA: Diagnosis not present

## 2017-03-11 DIAGNOSIS — M5116 Intervertebral disc disorders with radiculopathy, lumbar region: Secondary | ICD-10-CM | POA: Diagnosis not present

## 2017-03-11 DIAGNOSIS — Z23 Encounter for immunization: Secondary | ICD-10-CM | POA: Diagnosis not present

## 2017-03-11 DIAGNOSIS — Z0000 Encounter for general adult medical examination without abnormal findings: Secondary | ICD-10-CM

## 2017-03-11 DIAGNOSIS — Z125 Encounter for screening for malignant neoplasm of prostate: Secondary | ICD-10-CM | POA: Diagnosis not present

## 2017-03-11 DIAGNOSIS — E1151 Type 2 diabetes mellitus with diabetic peripheral angiopathy without gangrene: Secondary | ICD-10-CM | POA: Diagnosis not present

## 2017-03-11 LAB — BAYER DCA HB A1C WAIVED: HB A1C (BAYER DCA - WAIVED): 7.3 % — ABNORMAL HIGH (ref <7.0)

## 2017-03-11 MED ORDER — OXYCODONE-ACETAMINOPHEN 10-325 MG PO TABS: 1.0000 | ORAL_TABLET | ORAL | 0 refills | Status: DC | PRN

## 2017-03-11 NOTE — Patient Instructions (Signed)
In a few days you may receive a survey in the mail or online from [employer] regarding your visit with us today. Please take a moment to fill this out. Your feedback is very important to our whole office. It can help us better understand your needs as well as improve your experience and satisfaction. Thank you for taking your time to complete it. We care about you.  Frutoso Dimare, PA-C  

## 2017-03-11 NOTE — Progress Notes (Signed)
BP 139/66   Pulse 85   Temp 98.3 F (36.8 C) (Oral)   Ht '5\' 9"'  (1.753 m)   Wt 238 lb 12.8 oz (108.3 kg)   BMI 35.26 kg/m    Subjective:    Patient ID: Shane Frye, male    DOB: 1965/06/05, 52 y.o.   MRN: [id number]  HPI: Shane Frye is a 52 y.o. male presenting on 03/11/2017 for Follow-up (3 month )  This patient comes in for periodic recheck on medications and conditions including lumbar disc disease, coronary artery disease, hypertension, diabetes, chronic pain management.  He is due labs today.  We have also reviewed all of his medications.  Refills will be sent.  Overall he is doing well with his pain medication and does not need any adjustments..   All medications are reviewed today. There are no reports of any problems with the medications. All of the medical conditions are reviewed and updated.  Lab work is reviewed and will be ordered as medically necessary. There are no new problems reported with today's visit.   Relevant past medical, surgical, family and social history reviewed and updated as indicated. Allergies and medications reviewed and updated.  Past Medical History:  Diagnosis Date  . Anxiety   . COPD (chronic obstructive pulmonary disease) ([place])   . Depression   . Diabetes mellitus   . GERD (gastroesophageal reflux disease)   . High cholesterol   . Hypertension   . Insomnia   . Myocardial infarction ([facility]) 11/04/13  . Neuropathy     Past Surgical History:  Procedure Laterality Date  . CARDIOVERSION N/A 11/06/2013   Procedure: CARDIOVERSION;  Surgeon: Thayer Headings, MD;  Location: [facility];  Service: Cardiovascular;  Laterality: N/A;  . CARPAL TUNNEL RELEASE  04/2015  . COLONOSCOPY N/A 05/11/2014   Procedure: COLONOSCOPY;  Surgeon: Rogene Houston, MD;  [facility];  Service: Endoscopy;  Laterality: N/A;  930  . CORONARY ARTERY BYPASS GRAFT N/A 11/09/2013   Procedure: CORONARY ARTERY BYPASS GRAFTING (CABG);  Surgeon: Melrose Nakayama, MD;   Location: [facility];  Service: Open Heart Surgery;  Laterality: N/A;  . INTRAOPERATIVE TRANSESOPHAGEAL ECHOCARDIOGRAM N/A 11/09/2013   Procedure: INTRAOPERATIVE TRANSESOPHAGEAL ECHOCARDIOGRAM;  Surgeon: Melrose Nakayama, MD;  Location: [facility];  Service: Open Heart Surgery;  Laterality: N/A;  . LEFT HEART CATHETERIZATION WITH CORONARY ANGIOGRAM N/A 11/04/2013   Procedure: LEFT HEART CATHETERIZATION WITH CORONARY ANGIOGRAM;  Surgeon: Troy Sine, MD;  Location: [facility] CATH LAB;  Service: Cardiovascular;  Laterality: N/A;  . NO PAST SURGERIES      Review of Systems  Constitutional: Negative.  Negative for appetite change and fatigue.  HENT: Negative.   Eyes: Negative.  Negative for pain and visual disturbance.  Respiratory: Negative.  Negative for cough, chest tightness, shortness of breath and wheezing.   Cardiovascular: Negative.  Negative for chest pain, palpitations and leg swelling.  Gastrointestinal: Negative.  Negative for abdominal pain, diarrhea, nausea and vomiting.  Endocrine: Negative.   Genitourinary: Negative.   Musculoskeletal: Positive for arthralgias, back pain, gait problem, joint swelling and myalgias.  Skin: Negative.  Negative for color change and rash.  Neurological: Negative for weakness, numbness and headaches.  Psychiatric/Behavioral: Negative.     Allergies as of 03/11/2017   No Known Allergies     Medication List        Accurate as of 03/11/17  2:01 PM. Always use your most recent med list.  aspirin 81 MG tablet Take 81 mg by mouth daily.   carvedilol 12.5 MG tablet Commonly known as:  COREG Take 1 tablet (12.5 mg total) by mouth 2 (two) times daily.   citalopram 20 MG tablet Commonly known as:  CELEXA Take 1 tablet (20 mg total) by mouth daily.   gabapentin 300 MG capsule Commonly known as:  NEURONTIN Take 3 capsules (900 mg total) by mouth 3 (three) times daily. Slowly increase as discussed.   glucose blood test strip Use as instructed    hydrochlorothiazide 25 MG tablet Commonly known as:  HYDRODIURIL Take 1 tablet (25 mg total) by mouth daily.   insulin aspart protamine- aspart (70-30) 100 UNIT/ML injection Commonly known as:  NOVOLOG MIX 70/30 Inject 0.15-0.3 mLs (15-30 Units total) into the skin 3 (three) times daily.   Insulin Syringe-Needle U-100 31G X 5/16" 0.3 ML Misc Commonly known as:  RELION INSULIN SYR 0.3ML/31G USE ONE SYRINGE EACH TIME THREE TIMES DAILY   lisinopril 20 MG tablet Commonly known as:  PRINIVIL,ZESTRIL Take 1 tablet (20 mg total) by mouth 2 (two) times daily.   lovastatin 20 MG tablet Commonly known as:  MEVACOR Take 2 tablets (40 mg total) by mouth at bedtime.   metFORMIN 1000 MG tablet Commonly known as:  GLUCOPHAGE Take 1 tablet (1,000 mg total) by mouth 2 (two) times daily with a meal.   oxyCODONE-acetaminophen 10-325 MG tablet Commonly known as:  PERCOCET Take 1 tablet by mouth every 4 (four) hours as needed for pain. CHRONIC PAIN   oxyCODONE-acetaminophen 10-325 MG tablet Commonly known as:  PERCOCET Take 1 tablet by mouth every 4 (four) hours as needed for pain. CHRONIC PAIN   oxyCODONE-acetaminophen 10-325 MG tablet Commonly known as:  PERCOCET Take 1 tablet by mouth every 4 (four) hours as needed for pain. CHRONIC PAIN   pantoprazole 40 MG tablet Commonly known as:  PROTONIX Take 1 tablet (40 mg total) by mouth daily.          Objective:    BP 139/66   Pulse 85   Temp 98.3 F (36.8 C) (Oral)   Ht '5\' 9"'  (1.753 m)   Wt 238 lb 12.8 oz (108.3 kg)   BMI 35.26 kg/m   No Known Allergies  Physical Exam  Constitutional: He appears well-developed and well-nourished. No distress.  HENT:  Head: Normocephalic and atraumatic.  Eyes: Conjunctivae and EOM are normal. Pupils are equal, round, and reactive to light.  Cardiovascular: Normal rate, regular rhythm and normal heart sounds.  Pulmonary/Chest: Effort normal and breath sounds normal. No respiratory distress.    Musculoskeletal:       Lumbar back: He exhibits decreased range of motion, tenderness, pain and spasm.  Neurological:  Reflex Scores:      Patellar reflexes are 1+ on the right side and 3+ on the left side. Skin: Skin is warm and dry.  Psychiatric: He has a normal mood and affect. His behavior is normal.  Nursing note and vitals reviewed.   Results for orders placed or performed in visit on 03/11/17  Bayer DCA Hb A1c Waived  Result Value Ref Range   Bayer DCA Hb A1c Waived 7.3 (H) <7.0 %      Assessment & Plan:   1. Lumbar disc disease with radiculopathy - oxyCODONE-acetaminophen (PERCOCET) 10-325 MG tablet; Take 1 tablet by mouth every 4 (four) hours as needed for pain. CHRONIC PAIN  Dispense: 180 tablet; Refill: 0 - oxyCODONE-acetaminophen (PERCOCET) 10-325 MG tablet; Take 1 tablet  by mouth every 4 (four) hours as needed for pain. CHRONIC PAIN  Dispense: 180 tablet; Refill: 0 - oxyCODONE-acetaminophen (PERCOCET) 10-325 MG tablet; Take 1 tablet by mouth every 4 (four) hours as needed for pain. CHRONIC PAIN  Dispense: 180 tablet; Refill: 0  2. Coronary artery disease involving native coronary artery of native heart without angina pectoris - CBC with Differential/Platelet - CMP14+EGFR - Lipid panel  3. Essential hypertension  4. Type 2 diabetes mellitus with diabetic peripheral angiopathy without gangrene, with long-term current use of insulin (HCC) - Bayer DCA Hb A1c Waived  5. Pain management contract agreement  6. Well adult exam - CBC with Differential/Platelet - CMP14+EGFR - Lipid panel - Bayer DCA Hb A1c Waived - PSA  7. Need for immunization against influenza - Flu Vaccine QUAD 36+ mos IM    Current Outpatient Medications:  .  aspirin 81 MG tablet, Take 81 mg by mouth daily., Disp: , Rfl:  .  carvedilol (COREG) 12.5 MG tablet, Take 1 tablet (12.5 mg total) by mouth 2 (two) times daily., Disp: 180 tablet, Rfl: 3 .  citalopram (CELEXA) 20 MG tablet, Take 1  tablet (20 mg total) by mouth daily., Disp: 90 tablet, Rfl: 3 .  gabapentin (NEURONTIN) 300 MG capsule, Take 3 capsules (900 mg total) by mouth 3 (three) times daily. Slowly increase as discussed., Disp: 810 capsule, Rfl: 3 .  glucose blood test strip, Use as instructed, Disp: 100 each, Rfl: 12 .  hydrochlorothiazide (HYDRODIURIL) 25 MG tablet, Take 1 tablet (25 mg total) by mouth daily., Disp: 90 tablet, Rfl: 3 .  insulin aspart protamine- aspart (NOVOLOG MIX 70/30) (70-30) 100 UNIT/ML injection, Inject 0.15-0.3 mLs (15-30 Units total) into the skin 3 (three) times daily., Disp: 30 mL, Rfl: 1 .  Insulin Syringe-Needle U-100 (RELION INSULIN SYR 0.3ML/31G) 31G X 5/16" 0.3 ML MISC, USE ONE SYRINGE EACH TIME THREE TIMES DAILY, Disp: 100 each, Rfl: 2 .  lisinopril (PRINIVIL,ZESTRIL) 20 MG tablet, Take 1 tablet (20 mg total) by mouth 2 (two) times daily., Disp: 180 tablet, Rfl: 3 .  lovastatin (MEVACOR) 20 MG tablet, Take 2 tablets (40 mg total) by mouth at bedtime., Disp: 180 tablet, Rfl: 3 .  metFORMIN (GLUCOPHAGE) 1000 MG tablet, Take 1 tablet (1,000 mg total) by mouth 2 (two) times daily with a meal., Disp: 180 tablet, Rfl: 3 .  oxyCODONE-acetaminophen (PERCOCET) 10-325 MG tablet, Take 1 tablet by mouth every 4 (four) hours as needed for pain. CHRONIC PAIN, Disp: 180 tablet, Rfl: 0 .  oxyCODONE-acetaminophen (PERCOCET) 10-325 MG tablet, Take 1 tablet by mouth every 4 (four) hours as needed for pain. CHRONIC PAIN, Disp: 180 tablet, Rfl: 0 .  oxyCODONE-acetaminophen (PERCOCET) 10-325 MG tablet, Take 1 tablet by mouth every 4 (four) hours as needed for pain. CHRONIC PAIN, Disp: 180 tablet, Rfl: 0 .  pantoprazole (PROTONIX) 40 MG tablet, Take 1 tablet (40 mg total) by mouth daily., Disp: 90 tablet, Rfl: 3 Continue all other maintenance medications as listed above.  Follow up plan: Return in about 3 months (around 06/09/2017) for recheck.  Educational handout given for [place]  PA-C [place] [address]  [place], [place] [address] [phone]   03/11/2017, 2:01 PM

## 2017-03-12 LAB — CBC WITH DIFFERENTIAL/PLATELET
BASOS: 0 %
Basophils Absolute: 0 10*3/uL (ref 0.0–0.2)
EOS (ABSOLUTE): 0.2 10*3/uL (ref 0.0–0.4)
Eos: 2 %
Hematocrit: 46.6 % (ref 37.5–51.0)
Hemoglobin: 15.5 g/dL (ref 13.0–17.7)
IMMATURE GRANULOCYTES: 0 %
Immature Grans (Abs): 0 10*3/uL (ref 0.0–0.1)
Lymphocytes Absolute: 2.4 10*3/uL (ref 0.7–3.1)
Lymphs: 19 %
MCH: 27.6 pg (ref 26.6–33.0)
MCHC: 33.3 g/dL (ref 31.5–35.7)
MCV: 83 fL (ref 79–97)
MONOS ABS: 0.7 10*3/uL (ref 0.1–0.9)
Monocytes: 5 %
NEUTROS ABS: 9.3 10*3/uL — AB (ref 1.4–7.0)
NEUTROS PCT: 74 %
PLATELETS: 315 10*3/uL (ref 150–379)
RBC: 5.61 x10E6/uL (ref 4.14–5.80)
RDW: 14.8 % (ref 12.3–15.4)
WBC: 12.7 10*3/uL — ABNORMAL HIGH (ref 3.4–10.8)

## 2017-03-12 LAB — CMP14+EGFR
A/G RATIO: 1.4 (ref 1.2–2.2)
ALK PHOS: 79 IU/L (ref 39–117)
ALT: 6 IU/L (ref 0–44)
AST: 12 IU/L (ref 0–40)
Albumin: 3.9 g/dL (ref 3.5–5.5)
BUN/Creatinine Ratio: 16 (ref 9–20)
BUN: 13 mg/dL (ref 6–24)
Bilirubin Total: 0.3 mg/dL (ref 0.0–1.2)
CO2: 23 mmol/L (ref 20–29)
Calcium: 9.1 mg/dL (ref 8.7–10.2)
Chloride: 96 mmol/L (ref 96–106)
Creatinine, Ser: 0.8 mg/dL (ref 0.76–1.27)
GFR calc Af Amer: 117 mL/min/{1.73_m2} (ref >59)
GFR calc non Af Amer: 101 mL/min/{1.73_m2} (ref >59)
Globulin, Total: 2.8 g/dL (ref 1.5–4.5)
Glucose: 227 mg/dL — ABNORMAL HIGH (ref 65–99)
POTASSIUM: 4.9 mmol/L (ref 3.5–5.2)
Sodium: 139 mmol/L (ref 134–144)
Total Protein: 6.7 g/dL (ref 6.0–8.5)

## 2017-03-12 LAB — LIPID PANEL
Chol/HDL Ratio: 4.4 ratio (ref 0.0–5.0)
Cholesterol, Total: 167 mg/dL (ref 100–199)
HDL: 38 mg/dL — AB (ref >39)
LDL Calculated: 96 mg/dL (ref 0–99)
TRIGLYCERIDES: 165 mg/dL — AB (ref 0–149)
VLDL Cholesterol Cal: 33 mg/dL (ref 5–40)

## 2017-03-12 LAB — PSA: Prostate Specific Ag, Serum: 2.9 ng/mL (ref 0.0–4.0)

## 2017-04-13 ENCOUNTER — Ambulatory Visit (INDEPENDENT_AMBULATORY_CARE_PROVIDER_SITE_OTHER): Payer: Self-pay | Admitting: Otolaryngology

## 2017-05-01 ENCOUNTER — Telehealth: Payer: Self-pay | Admitting: Physician Assistant

## 2017-05-01 ENCOUNTER — Other Ambulatory Visit: Payer: Self-pay | Admitting: Physician Assistant

## 2017-05-01 DIAGNOSIS — M5116 Intervertebral disc disorders with radiculopathy, lumbar region: Secondary | ICD-10-CM

## 2017-05-01 MED ORDER — OXYCODONE-ACETAMINOPHEN 10-325 MG PO TABS: 1.0000 | ORAL_TABLET | ORAL | 0 refills | Status: DC | PRN

## 2017-05-01 NOTE — Telephone Encounter (Signed)
Patient is not dismissed per [place] they have his account on hold. He is not able to make an appointment until he pays balance.

## 2017-05-01 NOTE — Telephone Encounter (Signed)
So I am sending a limited amount and he can contact us once he returns home.  [place] to bill will do partial.

## 2017-05-01 NOTE — Telephone Encounter (Signed)
The chart states that he is dismissed and I cannot do anything in it.

## 2017-05-12 ENCOUNTER — Telehealth: Payer: Self-pay | Admitting: Physician Assistant

## 2017-05-13 NOTE — Telephone Encounter (Signed)
Refer to phone note from 3/26

## 2017-05-20 NOTE — Telephone Encounter (Signed)
Have attempted to contact patient - no call back- this encounter will be closed.

## 2017-06-19 ENCOUNTER — Encounter: Payer: Self-pay | Admitting: Physician Assistant

## 2017-06-19 ENCOUNTER — Ambulatory Visit (INDEPENDENT_AMBULATORY_CARE_PROVIDER_SITE_OTHER): Payer: Medicare HMO | Admitting: Physician Assistant

## 2017-06-19 VITALS — BP 144/83 | HR 83 | Temp 97.5°F | Ht 69.0 in | Wt 226.0 lb

## 2017-06-19 DIAGNOSIS — G2581 Restless legs syndrome: Secondary | ICD-10-CM | POA: Diagnosis not present

## 2017-06-19 DIAGNOSIS — E1151 Type 2 diabetes mellitus with diabetic peripheral angiopathy without gangrene: Secondary | ICD-10-CM | POA: Diagnosis not present

## 2017-06-19 DIAGNOSIS — K219 Gastro-esophageal reflux disease without esophagitis: Secondary | ICD-10-CM | POA: Diagnosis not present

## 2017-06-19 DIAGNOSIS — M5116 Intervertebral disc disorders with radiculopathy, lumbar region: Secondary | ICD-10-CM | POA: Diagnosis not present

## 2017-06-19 DIAGNOSIS — I10 Essential (primary) hypertension: Secondary | ICD-10-CM | POA: Diagnosis not present

## 2017-06-19 DIAGNOSIS — J449 Chronic obstructive pulmonary disease, unspecified: Secondary | ICD-10-CM | POA: Diagnosis not present

## 2017-06-19 DIAGNOSIS — I2510 Atherosclerotic heart disease of native coronary artery without angina pectoris: Secondary | ICD-10-CM

## 2017-06-19 DIAGNOSIS — Z794 Long term (current) use of insulin: Secondary | ICD-10-CM | POA: Diagnosis not present

## 2017-06-19 DIAGNOSIS — E119 Type 2 diabetes mellitus without complications: Secondary | ICD-10-CM

## 2017-06-19 MED ORDER — OXYCODONE-ACETAMINOPHEN 10-325 MG PO TABS: 1.0000 | ORAL_TABLET | ORAL | 0 refills | Status: DC | PRN

## 2017-06-19 MED ORDER — ROPINIROLE HCL 0.5 MG PO TABS: 0.5000 mg | ORAL_TABLET | Freq: Every day | ORAL | 5 refills | Status: DC

## 2017-06-19 MED ORDER — INSULIN ASPART PROT & ASPART (70-30 MIX) 100 UNIT/ML ~~LOC~~ SUSP
15.0000 [IU] | Freq: Three times a day (TID) | SUBCUTANEOUS | 5 refills | Status: DC
Start: 2017-06-19 — End: 2018-03-09

## 2017-06-19 MED ORDER — "INSULIN SYRINGE-NEEDLE U-100 31G X 5/16"" 0.3 ML MISC": 5 refills | Status: DC

## 2017-06-19 NOTE — Progress Notes (Signed)
BP (!) 144/83   Pulse 83   Temp (!) 97.5 F (36.4 C) (Oral)   Ht '5\' 9"'  (1.753 m)   Wt 226 lb (102.5 kg)   BMI 33.37 kg/m    Subjective:    Patient ID: Shane Frye, male    DOB: 12-24-1965, 52 y.o.   MRN: [id number]  HPI: Shane Frye is a 52 y.o. male presenting on 06/19/2017 for Diabetes; Hyperlipidemia; and Hypertension  This patient comes in for 24-monthrecheck on his conditions.  He does have hypertension, hyperlipidemia, diabetes and degenerative disc disease.  He has worked very hard and over the past year has lost 45 pounds to date.  He is working very hard on his food choices and getting his diabetes under control.  He was tested for sleep apnea through his cardiologist.  He was found to have restless legs.  No medication has been given yet.  Past Medical History:  Diagnosis Date  . Anxiety   . COPD (chronic obstructive pulmonary disease) ([place]   . Depression   . Diabetes mellitus   . GERD (gastroesophageal reflux disease)   . High cholesterol   . Hypertension   . Insomnia   . [place] 11[place]  . Neuropathy    Relevant past medical, surgical, family and social history reviewed and updated as indicated. Interim medical history since our last visit reviewed. Allergies and medications reviewed and updated. DATA REVIEWED: CHART IN EPIC  Family History reviewed for pertinent findings.  Review of Systems  Constitutional: Negative.  Negative for appetite change and fatigue.  HENT: Negative.   Eyes: Negative.  Negative for pain and visual disturbance.  Respiratory: Negative.  Negative for cough, chest tightness, shortness of breath and wheezing.   Cardiovascular: Negative.  Negative for chest pain, palpitations and leg swelling.  Gastrointestinal: Negative.  Negative for abdominal pain, diarrhea, nausea and vomiting.  Endocrine: Negative.   Genitourinary: Negative.   Musculoskeletal: Negative.   Skin: Negative.  Negative for color change and  rash.  Neurological: Negative.  Negative for weakness, numbness and headaches.  Psychiatric/Behavioral: Negative.     Allergies as of 06/19/2017   No Known Allergies     Medication List        Accurate as of 06/19/17  9:08 AM. Always use your most recent med list.          aspirin 81 MG tablet Take 81 mg by mouth daily.   carvedilol 12.5 MG tablet Commonly known as:  COREG Take 1 tablet (12.5 mg total) by mouth 2 (two) times daily.   citalopram 20 MG tablet Commonly known as:  CELEXA Take 1 tablet (20 mg total) by mouth daily.   gabapentin 300 MG capsule Commonly known as:  NEURONTIN Take 3 capsules (900 mg total) by mouth 3 (three) times daily. Slowly increase as discussed.   glucose blood test strip Use as instructed   hydrochlorothiazide 25 MG tablet Commonly known as:  HYDRODIURIL Take 1 tablet (25 mg total) by mouth daily.   insulin aspart protamine- aspart (70-30) 100 UNIT/ML injection Commonly known as:  NOVOLOG MIX 70/30 Inject 0.15-0.3 mLs (15-30 Units total) into the skin 3 (three) times daily.   Insulin Syringe-Needle U-100 31G X 5/16" 0.3 ML Misc Commonly known as:  RELION INSULIN SYR 0.3ML/31G USE ONE SYRINGE EACH TIME THREE TIMES DAILY   lisinopril 20 MG tablet Commonly known as:  PRINIVIL,ZESTRIL Take 1 tablet (20 mg total) by mouth 2 (two) times  daily.   lovastatin 20 MG tablet Commonly known as:  MEVACOR Take 2 tablets (40 mg total) by mouth at bedtime.   metFORMIN 1000 MG tablet Commonly known as:  GLUCOPHAGE Take 1 tablet (1,000 mg total) by mouth 2 (two) times daily with a meal.   oxyCODONE-acetaminophen 10-325 MG tablet Commonly known as:  PERCOCET Take 1 tablet by mouth every 4 (four) hours as needed for pain. CHRONIC PAIN   oxyCODONE-acetaminophen 10-325 MG tablet Commonly known as:  PERCOCET Take 1 tablet by mouth every 4 (four) hours as needed for pain. CHRONIC PAIN   oxyCODONE-acetaminophen 10-325 MG tablet Commonly known as:   PERCOCET Take 1 tablet by mouth every 4 (four) hours as needed for pain. CHRONIC PAIN   pantoprazole 40 MG tablet Commonly known as:  PROTONIX Take 1 tablet (40 mg total) by mouth daily.   rOPINIRole 0.5 MG tablet Commonly known as:  REQUIP Take 1-2 tablets (0.5-1 mg total) by mouth at bedtime.          Objective:    BP (!) 144/83   Pulse 83   Temp (!) 97.5 F (36.4 C) (Oral)   Ht '5\' 9"'  (1.753 m)   Wt 226 lb (102.5 kg)   BMI 33.37 kg/m   No Known Allergies  Wt Readings from Last 3 Encounters:  06/19/17 226 lb (102.5 kg)  03/11/17 238 lb 12.8 oz (108.3 kg)  12/09/16 246 lb 9.6 oz (111.9 kg)    Physical Exam  Constitutional: He appears well-developed and well-nourished.  HENT:  Head: Normocephalic and atraumatic.  Eyes: Pupils are equal, round, and reactive to light. Conjunctivae and EOM are normal.  Neck: Normal range of motion. Neck supple.  Cardiovascular: Normal rate, regular rhythm and normal heart sounds.  Pulmonary/Chest: Effort normal and breath sounds normal.  Abdominal: Soft. Bowel sounds are normal.  Musculoskeletal: Normal range of motion.  Skin: Skin is warm and dry.    Results for orders placed or performed in visit on 03/11/17  CBC with Differential/Platelet  Result Value Ref Range   WBC 12.7 (H) 3.4 - 10.8 x10E3/uL   RBC 5.61 4.14 - 5.80 x10E6/uL   Hemoglobin 15.5 13.0 - 17.7 g/dL   Hematocrit 46.6 37.5 - 51.0 %   MCV 83 79 - 97 fL   MCH 27.6 26.6 - 33.0 pg   MCHC 33.3 31.5 - 35.7 g/dL   RDW 14.8 12.3 - 15.4 %   Platelets 315 150 - 379 x10E3/uL   Neutrophils 74 Not Estab. %   Lymphs 19 Not Estab. %   Monocytes 5 Not Estab. %   Eos 2 Not Estab. %   Basos 0 Not Estab. %   Neutrophils Absolute 9.3 (H) 1.4 - 7.0 x10E3/uL   Lymphocytes Absolute 2.4 0.7 - 3.1 x10E3/uL   Monocytes Absolute 0.7 0.1 - 0.9 x10E3/uL   EOS (ABSOLUTE) 0.2 0.0 - 0.4 x10E3/uL   Basophils Absolute 0.0 0.0 - 0.2 x10E3/uL   Immature Granulocytes 0 Not Estab. %    Immature Grans (Abs) 0.0 0.0 - 0.1 x10E3/uL  CMP14+EGFR  Result Value Ref Range   Glucose 227 (H) 65 - 99 mg/dL   BUN 13 6 - 24 mg/dL   Creatinine, Ser 0.80 0.76 - 1.27 mg/dL   GFR calc non Af Amer 101 >59 mL/min/1.73   GFR calc Af Amer 117 >59 mL/min/1.73   BUN/Creatinine Ratio 16 9 - 20   Sodium 139 134 - 144 mmol/L   Potassium 4.9 3.5 - 5.2  mmol/L   Chloride 96 96 - 106 mmol/L   CO2 23 20 - 29 mmol/L   Calcium 9.1 8.7 - 10.2 mg/dL   Total Protein 6.7 6.0 - 8.5 g/dL   Albumin 3.9 3.5 - 5.5 g/dL   Globulin, Total 2.8 1.5 - 4.5 g/dL   Albumin/Globulin Ratio 1.4 1.2 - 2.2   Bilirubin Total 0.3 0.0 - 1.2 mg/dL   Alkaline Phosphatase 79 39 - 117 IU/L   AST 12 0 - 40 IU/L   ALT 6 0 - 44 IU/L  Lipid panel  Result Value Ref Range   Cholesterol, Total 167 100 - 199 mg/dL   Triglycerides 165 (H) 0 - 149 mg/dL   HDL 38 (L) >39 mg/dL   VLDL Cholesterol Cal 33 5 - 40 mg/dL   LDL Calculated 96 0 - 99 mg/dL   Chol/HDL Ratio 4.4 0.0 - 5.0 ratio  Bayer DCA Hb A1c Waived  Result Value Ref Range   Bayer DCA Hb A1c Waived 7.3 (H) <7.0 %  PSA  Result Value Ref Range   Prostate Specific Ag, Serum 2.9 0.0 - 4.0 ng/mL      Assessment & Plan:   1. Coronary artery disease involving native coronary artery of native heart without angina pectoris  2. Essential hypertension  3. Gastroesophageal reflux disease without esophagitis  4. Type 2 diabetes mellitus with diabetic peripheral angiopathy without gangrene, with long-term current use of insulin ([place])  5. Lumbar disc disease with radiculopathy - oxyCODONE-acetaminophen (PERCOCET) 10-325 MG tablet; Take 1 tablet by mouth every 4 (four) hours as needed for pain. CHRONIC PAIN  Dispense: 180 tablet; Refill: 0 - oxyCODONE-acetaminophen (PERCOCET) 10-325 MG tablet; Take 1 tablet by mouth every 4 (four) hours as needed for pain. CHRONIC PAIN  Dispense: 180 tablet; Refill: 0 - oxyCODONE-acetaminophen (PERCOCET) 10-325 MG tablet; Take 1 tablet by  mouth every 4 (four) hours as needed for pain. CHRONIC PAIN  Dispense: 90 tablet; Refill: 0  6. Diabetes mellitus without complication (HCC) - insulin aspart protamine- aspart (NOVOLOG MIX 70/30) (70-30) 100 UNIT/ML injection; Inject 0.15-0.3 mLs (15-30 Units total) into the skin 3 (three) times daily.  Dispense: 30 mL; Refill: 5 - Insulin Syringe-Needle U-100 (RELION INSULIN SYR 0.3ML/31G) 31G X 5/16" 0.3 ML MISC; USE ONE SYRINGE EACH TIME THREE TIMES DAILY  Dispense: 100 each; Refill: 5  7. Restless legs syndrome - rOPINIRole (REQUIP) 0.5 MG tablet; Take 1-2 tablets (0.5-1 mg total) by mouth at bedtime.  Dispense: 60 tablet; Refill: 5   Continue all other maintenance medications as listed above.  Follow up plan: Return in about 3 months (around 09/19/2017) for recheck.  Educational handout given for [place] PA-C [place] [address]  [place], [place] [address] [phone]   06/19/2017, 9:08 AM

## 2017-08-07 ENCOUNTER — Telehealth: Payer: Self-pay | Admitting: Physician Assistant

## 2017-08-07 ENCOUNTER — Other Ambulatory Visit: Payer: Self-pay | Admitting: Physician Assistant

## 2017-08-07 DIAGNOSIS — M5116 Intervertebral disc disorders with radiculopathy, lumbar region: Secondary | ICD-10-CM

## 2017-08-07 MED ORDER — OXYCODONE-ACETAMINOPHEN 10-325 MG PO TABS: 1.0000 | ORAL_TABLET | ORAL | 0 refills | Status: DC | PRN

## 2017-08-07 NOTE — Telephone Encounter (Signed)
If he picked up today, has enough for 15 days. The other two ARE for 180. Call if there is an issue

## 2017-08-07 NOTE — Telephone Encounter (Signed)
Sent for pharmacy to refill when due

## 2017-08-07 NOTE — Telephone Encounter (Signed)
The pharmacy voided the June rx so he does need a new prescription. Patient is due around the 28th. He has rx for July and only needs the June

## 2017-10-08 ENCOUNTER — Telehealth: Payer: Self-pay | Admitting: Physician Assistant

## 2017-10-08 ENCOUNTER — Other Ambulatory Visit: Payer: Self-pay | Admitting: Physician Assistant

## 2017-10-08 DIAGNOSIS — M5116 Intervertebral disc disorders with radiculopathy, lumbar region: Secondary | ICD-10-CM

## 2017-10-08 MED ORDER — OXYCODONE-ACETAMINOPHEN 10-325 MG PO TABS: 1.0000 | ORAL_TABLET | ORAL | 0 refills | Status: DC | PRN

## 2017-10-08 NOTE — Telephone Encounter (Signed)
Sent one refill

## 2017-10-08 NOTE — Telephone Encounter (Signed)
Pt notified of refill Verbalizes understanding

## 2017-10-08 NOTE — Telephone Encounter (Signed)
Pt requesting refill on oxycodone Please advise

## 2017-11-20 ENCOUNTER — Encounter: Payer: Self-pay | Admitting: Physician Assistant

## 2017-11-20 ENCOUNTER — Ambulatory Visit (INDEPENDENT_AMBULATORY_CARE_PROVIDER_SITE_OTHER): Payer: Medicare HMO | Admitting: Physician Assistant

## 2017-11-20 DIAGNOSIS — G2581 Restless legs syndrome: Secondary | ICD-10-CM

## 2017-11-20 DIAGNOSIS — G629 Polyneuropathy, unspecified: Secondary | ICD-10-CM

## 2017-11-20 DIAGNOSIS — E119 Type 2 diabetes mellitus without complications: Secondary | ICD-10-CM

## 2017-11-20 DIAGNOSIS — M5116 Intervertebral disc disorders with radiculopathy, lumbar region: Secondary | ICD-10-CM | POA: Diagnosis not present

## 2017-11-20 DIAGNOSIS — Z23 Encounter for immunization: Secondary | ICD-10-CM

## 2017-11-20 DIAGNOSIS — I10 Essential (primary) hypertension: Secondary | ICD-10-CM

## 2017-11-20 DIAGNOSIS — I2510 Atherosclerotic heart disease of native coronary artery without angina pectoris: Secondary | ICD-10-CM

## 2017-11-20 LAB — BAYER DCA HB A1C WAIVED: HB A1C: 9.2 % — AB (ref <7.0)

## 2017-11-20 MED ORDER — CARVEDILOL 12.5 MG PO TABS: 12.5000 mg | ORAL_TABLET | Freq: Two times a day (BID) | ORAL | 3 refills | Status: DC

## 2017-11-20 MED ORDER — PANTOPRAZOLE SODIUM 40 MG PO TBEC: 40.0000 mg | DELAYED_RELEASE_TABLET | Freq: Every day | ORAL | 3 refills | Status: DC

## 2017-11-20 MED ORDER — OXYCODONE-ACETAMINOPHEN 10-325 MG PO TABS: 1.0000 | ORAL_TABLET | ORAL | 0 refills | Status: DC | PRN

## 2017-11-20 MED ORDER — CITALOPRAM HYDROBROMIDE 20 MG PO TABS: 20.0000 mg | ORAL_TABLET | Freq: Every day | ORAL | 3 refills | Status: DC

## 2017-11-20 MED ORDER — ROPINIROLE HCL 0.5 MG PO TABS: 0.5000 mg | ORAL_TABLET | Freq: Every day | ORAL | 5 refills | Status: DC

## 2017-11-20 MED ORDER — HYDROCHLOROTHIAZIDE 25 MG PO TABS: 25.0000 mg | ORAL_TABLET | Freq: Every day | ORAL | 3 refills | Status: DC

## 2017-11-20 MED ORDER — METFORMIN HCL 1000 MG PO TABS: 1000.0000 mg | ORAL_TABLET | Freq: Two times a day (BID) | ORAL | 3 refills | Status: DC

## 2017-11-20 MED ORDER — LISINOPRIL 20 MG PO TABS: 20.0000 mg | ORAL_TABLET | Freq: Two times a day (BID) | ORAL | 3 refills | Status: DC

## 2017-11-20 MED ORDER — "INSULIN SYRINGE-NEEDLE U-100 31G X 5/16"" 0.3 ML MISC": 5 refills | Status: DC

## 2017-11-20 MED ORDER — GABAPENTIN 300 MG PO CAPS: 900.0000 mg | ORAL_CAPSULE | Freq: Three times a day (TID) | ORAL | 3 refills | Status: DC

## 2017-11-20 MED ORDER — LOVASTATIN 20 MG PO TABS: 40.0000 mg | ORAL_TABLET | Freq: Every day | ORAL | 3 refills | Status: DC

## 2017-11-22 NOTE — Progress Notes (Signed)
BP (!) 142/92   Pulse 92   Temp 98 F (36.7 C)   Ht 5\' 9"  (1.753 m)   Wt 226 lb (102.5 kg)   BMI 33.37 kg/m    Subjective:    Patient ID: Shane Frye, male    DOB: 09-06-1965, 52 y.o.   MRN: [id number]  HPI: Shane Frye is a 52 y.o. male presenting on 11/20/2017 for Follow-up (medications)  This patient comes in for a 39-month recheck on his chronic medical conditions which do include hypertension, diabetes, chronic pain due to degenerative disc disease with neuropathy, coronary artery disease, restless legs and degenerative joint disease.  He does feel that his diabetes is not going to be as well controlled.  All of his medications are reviewed today.  We will also update some labs today.  He has no other complaints at this time.  Past Medical History:  Diagnosis Date  . Anxiety   . COPD (chronic obstructive pulmonary disease) ([place])   . Depression   . Diabetes mellitus   . GERD (gastroesophageal reflux disease)   . High cholesterol   . Hypertension   . Insomnia   . Myocardial infarction ([place]) 11/04/13  . Neuropathy    Relevant past medical, surgical, family and social history reviewed and updated as indicated. Interim medical history since our last visit reviewed. Allergies and medications reviewed and updated. DATA REVIEWED: CHART IN EPIC  Family History reviewed for pertinent findings.  Review of Systems  Constitutional: Negative.  Negative for appetite change and fatigue.  HENT: Negative.   Eyes: Negative.  Negative for pain and visual disturbance.  Respiratory: Negative.  Negative for cough, chest tightness, shortness of breath and wheezing.   Cardiovascular: Negative.  Negative for chest pain, palpitations and leg swelling.  Gastrointestinal: Negative.  Negative for abdominal pain, diarrhea, nausea and vomiting.  Endocrine: Negative.   Genitourinary: Negative.   Musculoskeletal: Positive for arthralgias, back pain, gait problem, joint swelling, myalgias,  neck pain and neck stiffness.  Skin: Negative.  Negative for color change and rash.  Neurological: Negative for weakness, numbness and headaches.  Psychiatric/Behavioral: Negative.     Allergies as of 11/20/2017   No Known Allergies     Medication List        Accurate as of 11/20/17 11:59 PM. Always use your most recent med list.          aspirin 81 MG tablet Take 81 mg by mouth daily.   carvedilol 12.5 MG tablet Commonly known as:  COREG Take 1 tablet (12.5 mg total) by mouth 2 (two) times daily.   citalopram 20 MG tablet Commonly known as:  CELEXA Take 1 tablet (20 mg total) by mouth daily.   gabapentin 300 MG capsule Commonly known as:  NEURONTIN Take 3 capsules (900 mg total) by mouth 3 (three) times daily. Slowly increase as discussed.   glucose blood test strip Use as instructed   hydrochlorothiazide 25 MG tablet Commonly known as:  HYDRODIURIL Take 1 tablet (25 mg total) by mouth daily.   insulin aspart protamine- aspart (70-30) 100 UNIT/ML injection Commonly known as:  NOVOLOG MIX 70/30 Inject 0.15-0.3 mLs (15-30 Units total) into the skin 3 (three) times daily.   Insulin Syringe-Needle U-100 31G X 5/16" 0.3 ML Misc USE ONE SYRINGE EACH TIME THREE TIMES DAILY   lisinopril 20 MG tablet Commonly known as:  PRINIVIL,ZESTRIL Take 1 tablet (20 mg total) by mouth 2 (two) times daily.   lovastatin 20  MG tablet Commonly known as:  MEVACOR Take 2 tablets (40 mg total) by mouth at bedtime.   metFORMIN 1000 MG tablet Commonly known as:  GLUCOPHAGE Take 1 tablet (1,000 mg total) by mouth 2 (two) times daily with a meal.   oxyCODONE-acetaminophen 10-325 MG tablet Commonly known as:  PERCOCET Take 1 tablet by mouth every 4 (four) hours as needed for pain. CHRONIC PAIN   oxyCODONE-acetaminophen 10-325 MG tablet Commonly known as:  PERCOCET Take 1 tablet by mouth every 4 (four) hours as needed for pain. CHRONIC PAIN   oxyCODONE-acetaminophen 10-325 MG  tablet Commonly known as:  PERCOCET Take 1 tablet by mouth every 4 (four) hours as needed for pain. CHRONIC PAIN   pantoprazole 40 MG tablet Commonly known as:  PROTONIX Take 1 tablet (40 mg total) by mouth daily.   rOPINIRole 0.5 MG tablet Commonly known as:  REQUIP Take 1-2 tablets (0.5-1 mg total) by mouth at bedtime.          Objective:    BP (!) 142/92   Pulse 92   Temp 98 F (36.7 C)   Ht 5\' 9"  (1.753 m)   Wt 226 lb (102.5 kg)   BMI 33.37 kg/m   No Known Allergies  Wt Readings from Last 3 Encounters:  11/20/17 226 lb (102.5 kg)  06/19/17 226 lb (102.5 kg)  03/11/17 238 lb 12.8 oz (108.3 kg)    Physical Exam  Constitutional: He appears well-developed and well-nourished. No distress.  HENT:  Head: Normocephalic and atraumatic.  Eyes: Pupils are equal, round, and reactive to light. Conjunctivae and EOM are normal.  Cardiovascular: Normal rate, regular rhythm and normal heart sounds.  Pulmonary/Chest: Effort normal and breath sounds normal. No respiratory distress.  Skin: Skin is warm and dry.  Psychiatric: He has a normal mood and affect. His behavior is normal.  Nursing note and vitals reviewed.   Results for orders placed or performed in visit on 11/20/17  Bayer DCA Hb A1c Waived  Result Value Ref Range   HB A1C (BAYER DCA - WAIVED) 9.2 (H) <7.0 %      Assessment & Plan:   1. Lumbar disc disease with radiculopathy - oxyCODONE-acetaminophen (PERCOCET) 10-325 MG tablet; Take 1 tablet by mouth every 4 (four) hours as needed for pain. CHRONIC PAIN  Dispense: 180 tablet; Refill: 0 - oxyCODONE-acetaminophen (PERCOCET) 10-325 MG tablet; Take 1 tablet by mouth every 4 (four) hours as needed for pain. CHRONIC PAIN  Dispense: 180 tablet; Refill: 0 - oxyCODONE-acetaminophen (PERCOCET) 10-325 MG tablet; Take 1 tablet by mouth every 4 (four) hours as needed for pain. CHRONIC PAIN  Dispense: 180 tablet; Refill: 0  2. Essential hypertension - carvedilol (COREG) 12.5  MG tablet; Take 1 tablet (12.5 mg total) by mouth 2 (two) times daily.  Dispense: 180 tablet; Refill: 3 - hydrochlorothiazide (HYDRODIURIL) 25 MG tablet; Take 1 tablet (25 mg total) by mouth daily.  Dispense: 90 tablet; Refill: 3 - lisinopril (PRINIVIL,ZESTRIL) 20 MG tablet; Take 1 tablet (20 mg total) by mouth 2 (two) times daily.  Dispense: 180 tablet; Refill: 3  3. Neuropathy - gabapentin (NEURONTIN) 300 MG capsule; Take 3 capsules (900 mg total) by mouth 3 (three) times daily. Slowly increase as discussed.  Dispense: 810 capsule; Refill: 3  4. Diabetes mellitus without complication (HCC) - Insulin Syringe-Needle U-100 (RELION INSULIN SYR 0.3ML/31G) 31G X 5/16" 0.3 ML MISC; USE ONE SYRINGE EACH TIME THREE TIMES DAILY  Dispense: 100 each; Refill: 5 - metFORMIN (GLUCOPHAGE) 1000  MG tablet; Take 1 tablet (1,000 mg total) by mouth 2 (two) times daily with a meal.  Dispense: 180 tablet; Refill: 3 - Bayer DCA Hb A1c Waived  5. Coronary artery disease involving native coronary artery of native heart without angina pectoris - lovastatin (MEVACOR) 20 MG tablet; Take 2 tablets (40 mg total) by mouth at bedtime.  Dispense: 180 tablet; Refill: 3  6. Restless legs syndrome - rOPINIRole (REQUIP) 0.5 MG tablet; Take 1-2 tablets (0.5-1 mg total) by mouth at bedtime.  Dispense: 60 tablet; Refill: 5  7. Need for immunization against influenza - Flu Vaccine QUAD 36+ mos IM   Continue all other maintenance medications as listed above.  Follow up plan: Return in about 3 months (around 02/20/2018).  Educational handout given for [place] PA-C [place] [address]  [place], [place] [address] [phone]   11/22/2017, 9:00 PM

## 2018-02-22 ENCOUNTER — Ambulatory Visit: Payer: Medicare HMO | Admitting: Physician Assistant

## 2018-03-09 ENCOUNTER — Ambulatory Visit (INDEPENDENT_AMBULATORY_CARE_PROVIDER_SITE_OTHER): Payer: Medicare HMO | Admitting: Physician Assistant

## 2018-03-09 ENCOUNTER — Encounter: Payer: Self-pay | Admitting: Physician Assistant

## 2018-03-09 VITALS — BP 139/86 | HR 72 | Temp 97.2°F | Ht 69.0 in | Wt 221.6 lb

## 2018-03-09 DIAGNOSIS — Z125 Encounter for screening for malignant neoplasm of prostate: Secondary | ICD-10-CM | POA: Diagnosis not present

## 2018-03-09 DIAGNOSIS — Z0000 Encounter for general adult medical examination without abnormal findings: Secondary | ICD-10-CM | POA: Diagnosis not present

## 2018-03-09 DIAGNOSIS — I10 Essential (primary) hypertension: Secondary | ICD-10-CM | POA: Diagnosis not present

## 2018-03-09 DIAGNOSIS — M5116 Intervertebral disc disorders with radiculopathy, lumbar region: Secondary | ICD-10-CM | POA: Diagnosis not present

## 2018-03-09 DIAGNOSIS — I2510 Atherosclerotic heart disease of native coronary artery without angina pectoris: Secondary | ICD-10-CM | POA: Diagnosis not present

## 2018-03-09 DIAGNOSIS — E119 Type 2 diabetes mellitus without complications: Secondary | ICD-10-CM

## 2018-03-09 LAB — BAYER DCA HB A1C WAIVED: HB A1C (BAYER DCA - WAIVED): 7.7 % — ABNORMAL HIGH (ref <7.0)

## 2018-03-09 MED ORDER — OXYCODONE-ACETAMINOPHEN 10-325 MG PO TABS: 1.0000 | ORAL_TABLET | ORAL | 0 refills | Status: DC | PRN

## 2018-03-09 MED ORDER — INSULIN ASPART PROT & ASPART (70-30 MIX) 100 UNIT/ML ~~LOC~~ SUSP: 15.0000 [IU] | Freq: Three times a day (TID) | SUBCUTANEOUS | 5 refills | Status: AC

## 2018-03-10 LAB — CBC WITH DIFFERENTIAL/PLATELET
BASOS ABS: 0 10*3/uL (ref 0.0–0.2)
Basos: 0 %
EOS (ABSOLUTE): 0.2 10*3/uL (ref 0.0–0.4)
EOS: 2 %
HEMATOCRIT: 45.7 % (ref 37.5–51.0)
HEMOGLOBIN: 15.6 g/dL (ref 13.0–17.7)
IMMATURE GRANS (ABS): 0 10*3/uL (ref 0.0–0.1)
IMMATURE GRANULOCYTES: 0 %
Lymphocytes Absolute: 3.8 10*3/uL — ABNORMAL HIGH (ref 0.7–3.1)
Lymphs: 35 %
MCH: 28 pg (ref 26.6–33.0)
MCHC: 34.1 g/dL (ref 31.5–35.7)
MCV: 82 fL (ref 79–97)
MONOCYTES: 7 %
Monocytes Absolute: 0.8 10*3/uL (ref 0.1–0.9)
Neutrophils Absolute: 6.2 10*3/uL (ref 1.4–7.0)
Neutrophils: 56 %
Platelets: 279 10*3/uL (ref 150–450)
RBC: 5.57 x10E6/uL (ref 4.14–5.80)
RDW: 13.2 % (ref 11.6–15.4)
WBC: 11 10*3/uL — AB (ref 3.4–10.8)

## 2018-03-10 LAB — CMP14+EGFR
ALBUMIN: 4.4 g/dL (ref 3.8–4.9)
ALK PHOS: 88 IU/L (ref 39–117)
ALT: 6 IU/L (ref 0–44)
AST: 10 IU/L (ref 0–40)
Albumin/Globulin Ratio: 2 (ref 1.2–2.2)
BUN / CREAT RATIO: 15 (ref 9–20)
BUN: 13 mg/dL (ref 6–24)
Bilirubin Total: 0.7 mg/dL (ref 0.0–1.2)
CALCIUM: 9.7 mg/dL (ref 8.7–10.2)
CO2: 23 mmol/L (ref 20–29)
CREATININE: 0.88 mg/dL (ref 0.76–1.27)
Chloride: 95 mmol/L — ABNORMAL LOW (ref 96–106)
GFR calc Af Amer: 112 mL/min/{1.73_m2} (ref >59)
GFR, EST NON AFRICAN AMERICAN: 97 mL/min/{1.73_m2} (ref >59)
GLUCOSE: 87 mg/dL (ref 65–99)
Globulin, Total: 2.2 g/dL (ref 1.5–4.5)
Potassium: 4.4 mmol/L (ref 3.5–5.2)
Sodium: 137 mmol/L (ref 134–144)
Total Protein: 6.6 g/dL (ref 6.0–8.5)

## 2018-03-10 LAB — PSA: Prostate Specific Ag, Serum: 1.1 ng/mL (ref 0.0–4.0)

## 2018-03-10 LAB — LIPID PANEL
CHOLESTEROL TOTAL: 147 mg/dL (ref 100–199)
Chol/HDL Ratio: 3.7 ratio (ref 0.0–5.0)
HDL: 40 mg/dL (ref >39)
LDL Calculated: 84 mg/dL (ref 0–99)
Triglycerides: 113 mg/dL (ref 0–149)
VLDL Cholesterol Cal: 23 mg/dL (ref 5–40)

## 2018-03-12 NOTE — Progress Notes (Signed)
BP 139/86   Pulse 72   Temp (!) 97.2 F (36.2 C) (Oral)   Ht _0  (1.753 m)   Wt 221 lb 9.6 oz (100.5 kg)   BMI 32.72 kg/m    Subjective:    Patient ID: Shane Frye, male    DOB: [birth year], 53 y.o.   MRN: [id number]  HPI: Shane Frye is a 53 y.o. male presenting on 03/09/2018 for Hypertension (3 month ); Diabetes; and Hyperlipidemia  This patient comes in for periodic recheck on his chronic medical conditions which do include diabetes, hypertension, history of coronary artery disease, lumbar pain with chronic radiculopathy.  All of his labs are reviewed.  He does not have any issues at this time.  There are some refills that are needed.  He denies any acute problems with chest pain, shortness of breath no dizziness.  Blood pressures been well controlled.  He does occasionally have a lot of pain at the end of the day after he has been up and walking.  This will deformities.  Past Medical History:  Diagnosis Date  . Anxiety   . COPD (chronic obstructive pulmonary disease) ([place])   . Depression   . Diabetes mellitus   . GERD (gastroesophageal reflux disease)   . High cholesterol   . Hypertension   . Insomnia   . Myocardial infarction ([place]) 11/04/13  . Neuropathy    Relevant past medical, surgical, family and social history reviewed and updated as indicated. Interim medical history since our last visit reviewed. Allergies and medications reviewed and updated. DATA REVIEWED: CHART IN EPIC  Family History reviewed for pertinent findings.  Review of Systems  Constitutional: Positive for fatigue. Negative for appetite change.  HENT: Negative.   Eyes: Negative.  Negative for pain, discharge and visual disturbance.  Respiratory: Positive for cough. Negative for chest tightness, shortness of breath and wheezing.   Cardiovascular: Negative.  Negative for chest pain, palpitations and leg swelling.  Gastrointestinal: Negative.  Negative for abdominal pain, diarrhea, nausea and  vomiting.  Endocrine: Negative.   Genitourinary: Negative.   Musculoskeletal: Positive for arthralgias, back pain and myalgias.  Skin: Negative.  Negative for color change and rash.  Neurological: Negative.  Negative for weakness, numbness and headaches.  Psychiatric/Behavioral: Negative.     Allergies as of 03/09/2018   No Known Allergies     Medication List       Accurate as of March 09, 2018 11:59 PM. Always use your most recent med list.        aspirin 81 MG tablet Take 81 mg by mouth daily.   carvedilol 12.5 MG tablet Commonly known as:  COREG Take 1 tablet (12.5 mg total) by mouth 2 (two) times daily.   citalopram 20 MG tablet Commonly known as:  CELEXA Take 1 tablet (20 mg total) by mouth daily.   gabapentin 300 MG capsule Commonly known as:  NEURONTIN Take 3 capsules (900 mg total) by mouth 3 (three) times daily. Slowly increase as discussed.   glucose blood test strip Use as instructed   hydrochlorothiazide 25 MG tablet Commonly known as:  HYDRODIURIL Take 1 tablet (25 mg total) by mouth daily.   insulin aspart protamine- aspart (70-30) 100 UNIT/ML injection Commonly known as:  NOVOLOG MIX 70/30 Inject 0.15-0.3 mLs (15-30 Units total) into the skin 3 (three) times daily.   Insulin Syringe-Needle U-100 31G X 5/16" 0.3 ML Misc Commonly known as:  RELION INSULIN SYR 0.3ML/31G USE ONE SYRINGE EACH TIME THREE  TIMES DAILY   lisinopril 20 MG tablet Commonly known as:  PRINIVIL,ZESTRIL Take 1 tablet (20 mg total) by mouth 2 (two) times daily.   lovastatin 20 MG tablet Commonly known as:  MEVACOR Take 2 tablets (40 mg total) by mouth at bedtime.   metFORMIN 1000 MG tablet Commonly known as:  GLUCOPHAGE Take 1 tablet (1,000 mg total) by mouth 2 (two) times daily with a meal.   oxyCODONE-acetaminophen 10-325 MG tablet Commonly known as:  PERCOCET Take 1 tablet by mouth every 4 (four) hours as needed for pain. CHRONIC PAIN   oxyCODONE-acetaminophen  10-325 MG tablet Commonly known as:  PERCOCET Take 1 tablet by mouth every 4 (four) hours as needed for pain. CHRONIC PAIN   oxyCODONE-acetaminophen 10-325 MG tablet Commonly known as:  PERCOCET Take 1 tablet by mouth every 4 (four) hours as needed for pain. CHRONIC PAIN   pantoprazole 40 MG tablet Commonly known as:  PROTONIX Take 1 tablet (40 mg total) by mouth daily.   rOPINIRole 0.5 MG tablet Commonly known as:  REQUIP Take 1-2 tablets (0.5-1 mg total) by mouth at bedtime.          Objective:    BP 139/86   Pulse 72   Temp (!) 97.2 F (36.2 C) (Oral)   Ht _0  (1.753 m)   Wt 221 lb 9.6 oz (100.5 kg)   BMI 32.72 kg/m   No Known Allergies  Wt Readings from Last 3 Encounters:  03/09/18 221 lb 9.6 oz (100.5 kg)  11/20/17 226 lb (102.5 kg)  06/19/17 226 lb (102.5 kg)    Physical Exam Constitutional:      Appearance: He is well-developed.  HENT:     Head: Normocephalic and atraumatic.  Eyes:     Conjunctiva/sclera: Conjunctivae normal.     Pupils: Pupils are equal, round, and reactive to light.  Neck:     Musculoskeletal: Normal range of motion and neck supple.  Cardiovascular:     Rate and Rhythm: Normal rate and regular rhythm.     Heart sounds: Normal heart sounds.  Pulmonary:     Effort: Pulmonary effort is normal.     Breath sounds: Normal breath sounds.  Abdominal:     General: Bowel sounds are normal.     Palpations: Abdomen is soft.  Musculoskeletal:     Lumbar back: He exhibits decreased range of motion, tenderness, pain and spasm.       Back:  Skin:    General: Skin is warm and dry.     Results for orders placed or performed in visit on 03/09/18  CBC with Differential/Platelet  Result Value Ref Range   WBC 11.0 (H) 3.4 - 10.8 x10E3/uL   RBC 5.57 4.14 - 5.80 x10E6/uL   Hemoglobin 15.6 13.0 - 17.7 g/dL   Hematocrit 45.7 37.5 - 51.0 %   MCV 82 79 - 97 fL   MCH 28.0 26.6 - 33.0 pg   MCHC 34.1 31.5 - 35.7 g/dL   RDW 13.2 11.6 - 15.4 %    Platelets 279 150 - 450 x10E3/uL   Neutrophils 56 Not Estab. %   Lymphs 35 Not Estab. %   Monocytes 7 Not Estab. %   Eos 2 Not Estab. %   Basos 0 Not Estab. %   Neutrophils Absolute 6.2 1.4 - 7.0 x10E3/uL   Lymphocytes Absolute 3.8 (H) 0.7 - 3.1 x10E3/uL   Monocytes Absolute 0.8 0.1 - 0.9 x10E3/uL   EOS (ABSOLUTE) 0.2 0.0 - 0.4  x10E3/uL   Basophils Absolute 0.0 0.0 - 0.2 x10E3/uL   Immature Granulocytes 0 Not Estab. %   Immature Grans (Abs) 0.0 0.0 - 0.1 x10E3/uL  CMP14+EGFR  Result Value Ref Range   Glucose 87 65 - 99 mg/dL   BUN 13 6 - 24 mg/dL   Creatinine, Ser 0.88 0.76 - 1.27 mg/dL   GFR calc non Af Amer 97 >59 mL/min/1.73   GFR calc Af Amer 112 >59 mL/min/1.73   BUN/Creatinine Ratio 15 9 - 20   Sodium 137 134 - 144 mmol/L   Potassium 4.4 3.5 - 5.2 mmol/L   Chloride 95 (L) 96 - 106 mmol/L   CO2 23 20 - 29 mmol/L   Calcium 9.7 8.7 - 10.2 mg/dL   Total Protein 6.6 6.0 - 8.5 g/dL   Albumin 4.4 3.8 - 4.9 g/dL   Globulin, Total 2.2 1.5 - 4.5 g/dL   Albumin/Globulin Ratio 2.0 1.2 - 2.2   Bilirubin Total 0.7 0.0 - 1.2 mg/dL   Alkaline Phosphatase 88 39 - 117 IU/L   AST 10 0 - 40 IU/L   ALT 6 0 - 44 IU/L  Lipid panel  Result Value Ref Range   Cholesterol, Total 147 100 - 199 mg/dL   Triglycerides 113 0 - 149 mg/dL   HDL 40 >39 mg/dL   VLDL Cholesterol Cal 23 5 - 40 mg/dL   LDL Calculated 84 0 - 99 mg/dL   Chol/HDL Ratio 3.7 0.0 - 5.0 ratio  Bayer DCA Hb A1c Waived  Result Value Ref Range   HB A1C (BAYER DCA - WAIVED) 7.7 (H) <7.0 %  PSA  Result Value Ref Range   Prostate Specific Ag, Serum 1.1 0.0 - 4.0 ng/mL      Assessment & Plan:   1. Lumbar disc disease with radiculopathy - oxyCODONE-acetaminophen (PERCOCET) 10-325 MG tablet; Take 1 tablet by mouth every 4 (four) hours as needed for pain. CHRONIC PAIN  Dispense: 180 tablet; Refill: 0 - oxyCODONE-acetaminophen (PERCOCET) 10-325 MG tablet; Take 1 tablet by mouth every 4 (four) hours as needed for pain. CHRONIC  PAIN  Dispense: 180 tablet; Refill: 0 - oxyCODONE-acetaminophen (PERCOCET) 10-325 MG tablet; Take 1 tablet by mouth every 4 (four) hours as needed for pain. CHRONIC PAIN  Dispense: 180 tablet; Refill: 0  2. Diabetes mellitus without complication (HCC) - insulin aspart protamine- aspart (NOVOLOG MIX 70/30) (70-30) 100 UNIT/ML injection; Inject 0.15-0.3 mLs (15-30 Units total) into the skin 3 (three) times daily.  Dispense: 30 mL; Refill: 5 - CBC with Differential/Platelet - CMP14+EGFR - Lipid panel - Bayer DCA Hb A1c Waived  3. Essential hypertension - CBC with Differential/Platelet - CMP14+EGFR - Lipid panel - Bayer DCA Hb A1c Waived  4. Coronary artery disease involving native coronary artery of native heart without angina pectoris - CBC with Differential/Platelet - CMP14+EGFR - Lipid panel - Bayer DCA Hb A1c Waived  5. Well adult exam - PSA  6. Screening for malignant neoplasm of prostate - PSA   Continue all other maintenance medications as listed above.  Follow up plan: Return in about 3 months (around 06/08/2018).  Educational handout given for [place] PA-C [place] [address]  [place], [place] [address] [phone]   03/12/2018, 2:49 PM

## 2018-03-22 ENCOUNTER — Ambulatory Visit: Payer: Medicare HMO

## 2018-05-06 DIAGNOSIS — I252 Old myocardial infarction: Secondary | ICD-10-CM | POA: Diagnosis not present

## 2018-05-06 DIAGNOSIS — I509 Heart failure, unspecified: Secondary | ICD-10-CM | POA: Diagnosis not present

## 2018-05-06 DIAGNOSIS — F17200 Nicotine dependence, unspecified, uncomplicated: Secondary | ICD-10-CM | POA: Diagnosis not present

## 2018-05-06 DIAGNOSIS — R7989 Other specified abnormal findings of blood chemistry: Secondary | ICD-10-CM | POA: Diagnosis not present

## 2018-05-06 DIAGNOSIS — I2510 Atherosclerotic heart disease of native coronary artery without angina pectoris: Secondary | ICD-10-CM | POA: Diagnosis not present

## 2018-05-06 DIAGNOSIS — R0602 Shortness of breath: Secondary | ICD-10-CM | POA: Diagnosis not present

## 2018-05-06 DIAGNOSIS — Z951 Presence of aortocoronary bypass graft: Secondary | ICD-10-CM | POA: Diagnosis not present

## 2018-05-06 DIAGNOSIS — E119 Type 2 diabetes mellitus without complications: Secondary | ICD-10-CM | POA: Diagnosis not present

## 2018-05-06 DIAGNOSIS — Z794 Long term (current) use of insulin: Secondary | ICD-10-CM | POA: Diagnosis not present

## 2018-05-06 DIAGNOSIS — I110 Hypertensive heart disease with heart failure: Secondary | ICD-10-CM | POA: Diagnosis not present

## 2018-06-08 ENCOUNTER — Other Ambulatory Visit: Payer: Self-pay

## 2018-06-08 ENCOUNTER — Ambulatory Visit: Payer: Medicare HMO | Admitting: Physician Assistant

## 2018-06-08 ENCOUNTER — Other Ambulatory Visit: Payer: Self-pay | Admitting: Physician Assistant

## 2018-06-08 DIAGNOSIS — M5116 Intervertebral disc disorders with radiculopathy, lumbar region: Secondary | ICD-10-CM

## 2018-06-08 MED ORDER — OXYCODONE-ACETAMINOPHEN 10-325 MG PO TABS: 1.0000 | ORAL_TABLET | ORAL | 0 refills | Status: DC | PRN

## 2018-06-10 DIAGNOSIS — R0989 Other specified symptoms and signs involving the circulatory and respiratory systems: Secondary | ICD-10-CM | POA: Diagnosis not present

## 2018-06-10 DIAGNOSIS — R0789 Other chest pain: Secondary | ICD-10-CM | POA: Diagnosis not present

## 2018-06-10 DIAGNOSIS — J069 Acute upper respiratory infection, unspecified: Secondary | ICD-10-CM | POA: Diagnosis not present

## 2018-06-10 DIAGNOSIS — J449 Chronic obstructive pulmonary disease, unspecified: Secondary | ICD-10-CM | POA: Diagnosis not present

## 2018-06-10 DIAGNOSIS — B9789 Other viral agents as the cause of diseases classified elsewhere: Secondary | ICD-10-CM | POA: Diagnosis not present

## 2018-06-10 DIAGNOSIS — K219 Gastro-esophageal reflux disease without esophagitis: Secondary | ICD-10-CM | POA: Diagnosis not present

## 2018-06-10 DIAGNOSIS — Z20828 Contact with and (suspected) exposure to other viral communicable diseases: Secondary | ICD-10-CM | POA: Diagnosis not present

## 2018-06-10 DIAGNOSIS — I509 Heart failure, unspecified: Secondary | ICD-10-CM | POA: Diagnosis not present

## 2018-06-10 DIAGNOSIS — R0602 Shortness of breath: Secondary | ICD-10-CM | POA: Diagnosis not present

## 2018-06-10 DIAGNOSIS — I110 Hypertensive heart disease with heart failure: Secondary | ICD-10-CM | POA: Diagnosis not present

## 2018-06-10 DIAGNOSIS — R079 Chest pain, unspecified: Secondary | ICD-10-CM | POA: Diagnosis not present

## 2018-06-29 ENCOUNTER — Ambulatory Visit (INDEPENDENT_AMBULATORY_CARE_PROVIDER_SITE_OTHER): Payer: Medicare HMO | Admitting: Physician Assistant

## 2018-06-29 ENCOUNTER — Encounter: Payer: Self-pay | Admitting: Physician Assistant

## 2018-06-29 ENCOUNTER — Other Ambulatory Visit: Payer: Self-pay

## 2018-06-29 DIAGNOSIS — I10 Essential (primary) hypertension: Secondary | ICD-10-CM | POA: Diagnosis not present

## 2018-06-29 DIAGNOSIS — J40 Bronchitis, not specified as acute or chronic: Secondary | ICD-10-CM | POA: Diagnosis not present

## 2018-06-29 DIAGNOSIS — G629 Polyneuropathy, unspecified: Secondary | ICD-10-CM | POA: Diagnosis not present

## 2018-06-29 DIAGNOSIS — M5116 Intervertebral disc disorders with radiculopathy, lumbar region: Secondary | ICD-10-CM | POA: Diagnosis not present

## 2018-06-29 DIAGNOSIS — I480 Paroxysmal atrial fibrillation: Secondary | ICD-10-CM

## 2018-06-29 DIAGNOSIS — I252 Old myocardial infarction: Secondary | ICD-10-CM | POA: Diagnosis not present

## 2018-06-29 DIAGNOSIS — I509 Heart failure, unspecified: Secondary | ICD-10-CM

## 2018-06-29 MED ORDER — OXYCODONE-ACETAMINOPHEN 10-325 MG PO TABS: 1.0000 | ORAL_TABLET | ORAL | 0 refills | Status: DC | PRN

## 2018-06-29 MED ORDER — OXYCODONE-ACETAMINOPHEN 10-325 MG PO TABS: 1.0000 | ORAL_TABLET | Freq: Four times a day (QID) | ORAL | 0 refills | Status: DC | PRN

## 2018-06-29 MED ORDER — NALOXONE HCL 4 MG/0.1ML NA LIQD: 1.0000 | Freq: Once | NASAL | 1 refills | Status: AC

## 2018-06-29 MED ORDER — OXYCODONE-ACETAMINOPHEN 10-325 MG PO TABS: 1.0000 | ORAL_TABLET | Freq: Three times a day (TID) | ORAL | 0 refills | Status: DC | PRN

## 2018-06-29 MED ORDER — ALBUTEROL SULFATE HFA 108 (90 BASE) MCG/ACT IN AERS: 2.0000 | INHALATION_SPRAY | Freq: Four times a day (QID) | RESPIRATORY_TRACT | 5 refills | Status: AC | PRN

## 2018-06-29 MED ORDER — FUROSEMIDE 20 MG PO TABS: 20.0000 mg | ORAL_TABLET | ORAL | 2 refills | Status: DC

## 2018-06-29 NOTE — Progress Notes (Signed)
Agree with taper off Percocet and taper on gabapentin.  If he has ongoing pain, could consider referral to PM&R to see if he is a good candidate for spinal injections.  No UDS on file but does appear to be ordered.  Would recommend having UDS/ Pain contract on file prior to any more prescriptions for controlled substances.  Shane Frye, [facility]

## 2018-06-29 NOTE — Progress Notes (Signed)
Telephone visit  Subjective: CC: Chronic medical conditions PCP: Terald Sleeper, PA-C CBJ:SEGBT D Fauth is a 53 y.o. male calls for telephone consult today. Patient provides verbal consent for consult held via phone.  Patient is identified with 2 separate identifiers.  At this time the entire area is on COVID-19 social distancing and stay home orders are in place.  Patient is of higher risk and therefore we are performing this by a virtual method.  Location of patient: Home Location of provider: [facility] Others present for call: No  The patient states that he went to the emergency department and was told that he had heart failure.  He was given IV Lasix and did have a great improvement in his symptoms.  He was sent home with a prescription of Lasix 20 mg 1 daily.  He is currently getting ready to run out of the medication.  I will refill the medication and we will have him get set up with cardiology in [facility] with Dr. Bronson Ing.  The patient states he used to see Dr. Wynonia Lawman in [place] but it has been many years.  He has had a history of an NSTEMI, and paroxysmal atrial fibrillation.  At this time he reports that his breathing is fairly good.  Of asked him to start waking him himself every day.  And if he sees a sudden change in his weight and he is at increased symptoms he is to let us know.  This is also a visit for recheck on his chronic pain due to degenerative disc disease.  We will plan to have urine drug screen performed at his next visit, he was seen through a tele-visit today.  PAIN ASSESSMENT: Cause of pain- DDD 2017: Small RIGHT central L4-5 disc extrusion displaces the traversing RIGHT L5 nerve. Degenerative lumbar spine without canal stenosis. Neural foraminal narrowing L4-5 and L5-S1: Moderate on the LEFT at L5-S1. This patient returns for a 3 month recheck on narcotic use for the above named conditions  Current medications-Percocet 10/325 1 to 2 tablets 3 times a day  Gabapentin, continue this medication We are reducing her medication by 30 tablets each month.  So this next month he will go to 150 pills, 120, and a 90. He is agreeable to this plan. Medication side effects- no Any concerns- need to lower his medication  Pain on scale of 1-10- 8 Frequency- daily What increases pain- walking What makes pain Better- rest Effects on ADL - moderate some days Any change in general medical condition- worsening COPD and CHF  Effectiveness of current meds- good Adverse reactions form pain meds-no PMP AWARE website reviewed: Yes Any suspicious activity on PMP Aware: No MME daily dose: 90  Contract on file 11/27/17 Last UDS  obtain at next visit  [facility] script sent or current  History of overdose or risk of abuse distant    ROS: Per HPI  No Known Allergies Past Medical History:  Diagnosis Date  . Anxiety   . COPD (chronic obstructive pulmonary disease) ([place])   . Depression   . Diabetes mellitus   . GERD (gastroesophageal reflux disease)   . High cholesterol   . Hypertension   . Insomnia   . Myocardial infarction ([place]) 11/04/13  . Neuropathy     Current Outpatient Medications:  .  albuterol (VENTOLIN HFA) 108 (90 Base) MCG/ACT inhaler, Inhale 2 puffs into the lungs every 6 (six) hours as needed for wheezing or shortness of breath., Disp: 1 Inhaler, Rfl:  5 .  aspirin 81 MG tablet, Take 81 mg by mouth daily., Disp: , Rfl:  .  carvedilol (COREG) 12.5 MG tablet, Take 1 tablet (12.5 mg total) by mouth 2 (two) times daily., Disp: 180 tablet, Rfl: 3 .  citalopram (CELEXA) 20 MG tablet, Take 1 tablet (20 mg total) by mouth daily., Disp: 90 tablet, Rfl: 3 .  furosemide (LASIX) 20 MG tablet, Take 1 tablet (20 mg total) by mouth every morning., Disp: 90 tablet, Rfl: 2 .  gabapentin (NEURONTIN) 300 MG capsule, Take 3 capsules (900 mg total) by mouth 3 (three) times daily. Slowly increase as discussed., Disp: 810 capsule, Rfl: 3 .  glucose blood test  strip, Use as instructed, Disp: 100 each, Rfl: 12 .  insulin aspart protamine- aspart (NOVOLOG MIX 70/30) (70-30) 100 UNIT/ML injection, Inject 0.15-0.3 mLs (15-30 Units total) into the skin 3 (three) times daily., Disp: 30 mL, Rfl: 5 .  Insulin Syringe-Needle U-100 (RELION INSULIN SYR 0.3ML/31G) 31G X 5/16" 0.3 ML MISC, USE ONE SYRINGE EACH TIME THREE TIMES DAILY, Disp: 100 each, Rfl: 5 .  lisinopril (PRINIVIL,ZESTRIL) 20 MG tablet, Take 1 tablet (20 mg total) by mouth 2 (two) times daily., Disp: 180 tablet, Rfl: 3 .  lovastatin (MEVACOR) 20 MG tablet, Take 2 tablets (40 mg total) by mouth at bedtime., Disp: 180 tablet, Rfl: 3 .  metFORMIN (GLUCOPHAGE) 1000 MG tablet, Take 1 tablet (1,000 mg total) by mouth 2 (two) times daily with a meal., Disp: 180 tablet, Rfl: 3 .  oxyCODONE-acetaminophen (PERCOCET) 10-325 MG tablet, Take 1 tablet by mouth every 8 (eight) hours as needed for pain. CHRONIC PAIN, Disp: 90 tablet, Rfl: 0 .  oxyCODONE-acetaminophen (PERCOCET) 10-325 MG tablet, Take 1 tablet by mouth every 6 (six) hours as needed for pain. CHRONIC PAIN, Disp: 120 tablet, Rfl: 0 .  oxyCODONE-acetaminophen (PERCOCET) 10-325 MG tablet, Take 1 tablet by mouth every 4 (four) hours as needed for pain. CHRONIC PAIN, Disp: 150 tablet, Rfl: 0 .  pantoprazole (PROTONIX) 40 MG tablet, Take 1 tablet (40 mg total) by mouth daily., Disp: 90 tablet, Rfl: 3 .  rOPINIRole (REQUIP) 0.5 MG tablet, Take 1-2 tablets (0.5-1 mg total) by mouth at bedtime., Disp: 60 tablet, Rfl: 5  Assessment/ Plan: 53 y.o. male   1. Bronchitis - albuterol (VENTOLIN HFA) 108 (90 Base) MCG/ACT inhaler; Inhale 2 puffs into the lungs every 6 (six) hours as needed for wheezing or shortness of breath.  Dispense: 1 Inhaler; Refill: 5  2. Lumbar disc disease with radiculopathy - oxyCODONE-acetaminophen (PERCOCET) 10-325 MG tablet; Take 1 tablet by mouth every 8 (eight) hours as needed for pain. CHRONIC PAIN  Dispense: 90 tablet; Refill: 0 -  oxyCODONE-acetaminophen (PERCOCET) 10-325 MG tablet; Take 1 tablet by mouth every 6 (six) hours as needed for pain. CHRONIC PAIN  Dispense: 120 tablet; Refill: 0 - oxyCODONE-acetaminophen (PERCOCET) 10-325 MG tablet; Take 1 tablet by mouth every 4 (four) hours as needed for pain. CHRONIC PAIN  Dispense: 150 tablet; Refill: 0 - [employer] Select 13 (MW), Urine; Future We are tapering the Percocet down each month by 1 tablet.  With a goal of 90 tablets/month, or less  3. Essential hypertension Continue medications  4. Neuropathy Continue gabapentin  5. Chronic congestive heart failure, unspecified heart failure type (HCC) - furosemide (LASIX) 20 MG tablet; Take 1 tablet (20 mg total) by mouth every morning.  Dispense: 90 tablet; Refill: 2 - Ambulatory referral to Cardiology  6. History of non-ST elevation myocardial infarction (NSTEMI) -  Ambulatory referral to Cardiology  7. PAF (paroxysmal atrial fibrillation) ([place]) - Ambulatory referral to Cardiology   Start time: 9:25 AM End time: 9:39 AM  Meds ordered this encounter  Medications  . furosemide (LASIX) 20 MG tablet    Sig: Take 1 tablet (20 mg total) by mouth every morning.    Dispense:  90 tablet    Refill:  2    Order Specific Question:   Supervising Provider    Answer:   Janora Norlander [[id number]]  . oxyCODONE-acetaminophen (PERCOCET) 10-325 MG tablet    Sig: Take 1 tablet by mouth every 8 (eight) hours as needed for pain. CHRONIC PAIN    Dispense:  90 tablet    Refill:  0    Fill 60 days from original script date    Order Specific Question:   Supervising Provider    Answer:   Janora Norlander [[id number]]  . oxyCODONE-acetaminophen (PERCOCET) 10-325 MG tablet    Sig: Take 1 tablet by mouth every 6 (six) hours as needed for pain. CHRONIC PAIN    Dispense:  120 tablet    Refill:  0    Fill 30 days from original script date    Order Specific Question:   Supervising Provider    Answer:   Janora Norlander [[id number]]   . oxyCODONE-acetaminophen (PERCOCET) 10-325 MG tablet    Sig: Take 1 tablet by mouth every 4 (four) hours as needed for pain. CHRONIC PAIN    Dispense:  150 tablet    Refill:  0    Order Specific Question:   Supervising Provider    Answer:   Janora Norlander [[id number]]  . albuterol (VENTOLIN HFA) 108 (90 Base) MCG/ACT inhaler    Sig: Inhale 2 puffs into the lungs every 6 (six) hours as needed for wheezing or shortness of breath.    Dispense:  1 Inhaler    Refill:  5    Order Specific Question:   Supervising Provider    Answer:   Janora Norlander [[id number]]    Particia Nearing PA-C [facility] [phone]

## 2018-07-11 DIAGNOSIS — F329 Major depressive disorder, single episode, unspecified: Secondary | ICD-10-CM | POA: Diagnosis not present

## 2018-07-11 DIAGNOSIS — R0602 Shortness of breath: Secondary | ICD-10-CM | POA: Diagnosis not present

## 2018-07-11 DIAGNOSIS — Z794 Long term (current) use of insulin: Secondary | ICD-10-CM | POA: Diagnosis not present

## 2018-07-11 DIAGNOSIS — I110 Hypertensive heart disease with heart failure: Secondary | ICD-10-CM | POA: Diagnosis not present

## 2018-07-11 DIAGNOSIS — I509 Heart failure, unspecified: Secondary | ICD-10-CM | POA: Diagnosis not present

## 2018-07-11 DIAGNOSIS — I2510 Atherosclerotic heart disease of native coronary artery without angina pectoris: Secondary | ICD-10-CM | POA: Diagnosis not present

## 2018-07-11 DIAGNOSIS — E119 Type 2 diabetes mellitus without complications: Secondary | ICD-10-CM | POA: Diagnosis not present

## 2018-07-11 DIAGNOSIS — I252 Old myocardial infarction: Secondary | ICD-10-CM | POA: Diagnosis not present

## 2018-07-11 DIAGNOSIS — K219 Gastro-esophageal reflux disease without esophagitis: Secondary | ICD-10-CM | POA: Diagnosis not present

## 2018-07-11 DIAGNOSIS — J449 Chronic obstructive pulmonary disease, unspecified: Secondary | ICD-10-CM | POA: Diagnosis not present

## 2018-07-12 ENCOUNTER — Emergency Department (HOSPITAL_COMMUNITY): Payer: Medicare HMO

## 2018-07-12 ENCOUNTER — Inpatient Hospital Stay (HOSPITAL_COMMUNITY)
Admission: EM | Admit: 2018-07-12 | Discharge: 2018-07-16 | DRG: 917 | Disposition: A | Discharge status: Home / Self Care | Payer: Medicare HMO | Attending: Internal Medicine | Admitting: Internal Medicine

## 2018-07-12 ENCOUNTER — Other Ambulatory Visit: Payer: Self-pay

## 2018-07-12 ENCOUNTER — Encounter (HOSPITAL_COMMUNITY): Payer: Self-pay | Admitting: Emergency Medicine

## 2018-07-12 DIAGNOSIS — I5041 Acute combined systolic (congestive) and diastolic (congestive) heart failure: Secondary | ICD-10-CM | POA: Diagnosis present

## 2018-07-12 DIAGNOSIS — R739 Hyperglycemia, unspecified: Secondary | ICD-10-CM

## 2018-07-12 DIAGNOSIS — Z8249 Family history of ischemic heart disease and other diseases of the circulatory system: Secondary | ICD-10-CM

## 2018-07-12 DIAGNOSIS — G4733 Obstructive sleep apnea (adult) (pediatric): Secondary | ICD-10-CM | POA: Diagnosis present

## 2018-07-12 DIAGNOSIS — I252 Old myocardial infarction: Secondary | ICD-10-CM

## 2018-07-12 DIAGNOSIS — I110 Hypertensive heart disease with heart failure: Secondary | ICD-10-CM | POA: Diagnosis present

## 2018-07-12 DIAGNOSIS — E1159 Type 2 diabetes mellitus with other circulatory complications: Secondary | ICD-10-CM | POA: Diagnosis present

## 2018-07-12 DIAGNOSIS — T405X1A Poisoning by cocaine, accidental (unintentional), initial encounter: Principal | ICD-10-CM | POA: Diagnosis present

## 2018-07-12 DIAGNOSIS — G4700 Insomnia, unspecified: Secondary | ICD-10-CM | POA: Diagnosis present

## 2018-07-12 DIAGNOSIS — I5021 Acute systolic (congestive) heart failure: Secondary | ICD-10-CM | POA: Diagnosis not present

## 2018-07-12 DIAGNOSIS — I248 Other forms of acute ischemic heart disease: Secondary | ICD-10-CM | POA: Diagnosis not present

## 2018-07-12 DIAGNOSIS — Z951 Presence of aortocoronary bypass graft: Secondary | ICD-10-CM | POA: Diagnosis not present

## 2018-07-12 DIAGNOSIS — F17210 Nicotine dependence, cigarettes, uncomplicated: Secondary | ICD-10-CM | POA: Diagnosis present

## 2018-07-12 DIAGNOSIS — R0602 Shortness of breath: Secondary | ICD-10-CM | POA: Diagnosis not present

## 2018-07-12 DIAGNOSIS — I2510 Atherosclerotic heart disease of native coronary artery without angina pectoris: Secondary | ICD-10-CM | POA: Diagnosis present

## 2018-07-12 DIAGNOSIS — E1165 Type 2 diabetes mellitus with hyperglycemia: Secondary | ICD-10-CM | POA: Diagnosis not present

## 2018-07-12 DIAGNOSIS — Z7984 Long term (current) use of oral hypoglycemic drugs: Secondary | ICD-10-CM

## 2018-07-12 DIAGNOSIS — I5043 Acute on chronic combined systolic (congestive) and diastolic (congestive) heart failure: Secondary | ICD-10-CM | POA: Diagnosis present

## 2018-07-12 DIAGNOSIS — Z20828 Contact with and (suspected) exposure to other viral communicable diseases: Secondary | ICD-10-CM | POA: Diagnosis not present

## 2018-07-12 DIAGNOSIS — Z794 Long term (current) use of insulin: Secondary | ICD-10-CM

## 2018-07-12 DIAGNOSIS — J449 Chronic obstructive pulmonary disease, unspecified: Secondary | ICD-10-CM | POA: Diagnosis present

## 2018-07-12 DIAGNOSIS — Z833 Family history of diabetes mellitus: Secondary | ICD-10-CM

## 2018-07-12 DIAGNOSIS — E785 Hyperlipidemia, unspecified: Secondary | ICD-10-CM | POA: Diagnosis present

## 2018-07-12 DIAGNOSIS — F1410 Cocaine abuse, uncomplicated: Secondary | ICD-10-CM | POA: Diagnosis not present

## 2018-07-12 DIAGNOSIS — IMO0002 Reserved for concepts with insufficient information to code with codable children: Secondary | ICD-10-CM

## 2018-07-12 DIAGNOSIS — Z7982 Long term (current) use of aspirin: Secondary | ICD-10-CM

## 2018-07-12 DIAGNOSIS — I472 Ventricular tachycardia: Secondary | ICD-10-CM | POA: Diagnosis not present

## 2018-07-12 DIAGNOSIS — I10 Essential (primary) hypertension: Secondary | ICD-10-CM

## 2018-07-12 DIAGNOSIS — E1100 Type 2 diabetes mellitus with hyperosmolarity without nonketotic hyperglycemic-hyperosmolar coma (NKHHC): Secondary | ICD-10-CM | POA: Diagnosis present

## 2018-07-12 DIAGNOSIS — I25710 Atherosclerosis of autologous vein coronary artery bypass graft(s) with unstable angina pectoris: Secondary | ICD-10-CM | POA: Diagnosis not present

## 2018-07-12 DIAGNOSIS — K219 Gastro-esophageal reflux disease without esophagitis: Secondary | ICD-10-CM | POA: Diagnosis present

## 2018-07-12 DIAGNOSIS — E1140 Type 2 diabetes mellitus with diabetic neuropathy, unspecified: Secondary | ICD-10-CM | POA: Diagnosis present

## 2018-07-12 DIAGNOSIS — I480 Paroxysmal atrial fibrillation: Secondary | ICD-10-CM | POA: Diagnosis not present

## 2018-07-12 DIAGNOSIS — E7800 Pure hypercholesterolemia, unspecified: Secondary | ICD-10-CM | POA: Diagnosis present

## 2018-07-12 DIAGNOSIS — I509 Heart failure, unspecified: Secondary | ICD-10-CM

## 2018-07-12 DIAGNOSIS — Z91199 Patient's noncompliance with other medical treatment and regimen due to unspecified reason: Secondary | ICD-10-CM

## 2018-07-12 DIAGNOSIS — G894 Chronic pain syndrome: Secondary | ICD-10-CM | POA: Diagnosis present

## 2018-07-12 DIAGNOSIS — Z9119 Patient's noncompliance with other medical treatment and regimen: Secondary | ICD-10-CM | POA: Diagnosis not present

## 2018-07-12 LAB — URINALYSIS, ROUTINE W REFLEX MICROSCOPIC
Bacteria, UA: NONE SEEN
Bilirubin Urine: NEGATIVE
Glucose, UA: >=500 mg/dL — AB
Hgb urine dipstick: NEGATIVE
Ketones, ur: NEGATIVE mg/dL
Leukocytes,Ua: NEGATIVE
Nitrite: NEGATIVE
Protein, ur: NEGATIVE mg/dL
Specific Gravity, Urine: 1.01 (ref 1.005–1.030)
pH: 6 (ref 5.0–8.0)

## 2018-07-12 LAB — CBC WITH DIFFERENTIAL/PLATELET
Abs Immature Granulocytes: 0.11 10*3/uL — ABNORMAL HIGH (ref 0.00–0.07)
Basophils Absolute: 0 10*3/uL (ref 0.0–0.1)
Basophils Relative: 0 %
Eosinophils Absolute: 0 10*3/uL (ref 0.0–0.5)
Eosinophils Relative: 0 %
HCT: 40.9 % (ref 39.0–52.0)
Hemoglobin: 13.2 g/dL (ref 13.0–17.0)
Immature Granulocytes: 1 %
Lymphocytes Relative: 6 %
Lymphs Abs: 0.9 10*3/uL (ref 0.7–4.0)
MCH: 26.6 pg (ref 26.0–34.0)
MCHC: 32.3 g/dL (ref 30.0–36.0)
MCV: 82.5 fL (ref 80.0–100.0)
Monocytes Absolute: 0.7 10*3/uL (ref 0.1–1.0)
Monocytes Relative: 5 %
Neutro Abs: 12.4 10*3/uL — ABNORMAL HIGH (ref 1.7–7.7)
Neutrophils Relative %: 88 %
Platelets: 273 10*3/uL (ref 150–400)
RBC: 4.96 MIL/uL (ref 4.22–5.81)
RDW: 13.3 % (ref 11.5–15.5)
WBC: 14.2 10*3/uL — ABNORMAL HIGH (ref 4.0–10.5)
nRBC: 0 % (ref 0.0–0.2)

## 2018-07-12 LAB — GLUCOSE, CAPILLARY
Glucose-Capillary: 240 mg/dL — ABNORMAL HIGH (ref 70–99)
Glucose-Capillary: 238 mg/dL — ABNORMAL HIGH (ref 70–99)
Glucose-Capillary: 142 mg/dL — ABNORMAL HIGH (ref 70–99)
Glucose-Capillary: 213 mg/dL — ABNORMAL HIGH (ref 70–99)

## 2018-07-12 LAB — RAPID URINE DRUG SCREEN, HOSP PERFORMED
Amphetamines: NOT DETECTED
Amphetamines: NOT DETECTED
Barbiturates: NOT DETECTED
Barbiturates: NOT DETECTED
Benzodiazepines: NOT DETECTED
Benzodiazepines: NOT DETECTED
Cocaine: POSITIVE — AB
Cocaine: POSITIVE — AB
Opiates: NOT DETECTED
Opiates: NOT DETECTED
Tetrahydrocannabinol: NOT DETECTED
Tetrahydrocannabinol: POSITIVE — AB

## 2018-07-12 LAB — BASIC METABOLIC PANEL
Anion gap: 17 — ABNORMAL HIGH (ref 5–15)
Anion gap: 16 — ABNORMAL HIGH (ref 5–15)
Anion gap: 14 (ref 5–15)
BUN: 23 mg/dL — ABNORMAL HIGH (ref 6–20)
BUN: 23 mg/dL — ABNORMAL HIGH (ref 6–20)
BUN: 22 mg/dL — ABNORMAL HIGH (ref 6–20)
CO2: 21 mmol/L — ABNORMAL LOW (ref 22–32)
CO2: 23 mmol/L (ref 22–32)
CO2: 24 mmol/L (ref 22–32)
Calcium: 8.3 mg/dL — ABNORMAL LOW (ref 8.9–10.3)
Calcium: 8.5 mg/dL — ABNORMAL LOW (ref 8.9–10.3)
Calcium: 8.7 mg/dL — ABNORMAL LOW (ref 8.9–10.3)
Chloride: 91 mmol/L — ABNORMAL LOW (ref 98–111)
Chloride: 93 mmol/L — ABNORMAL LOW (ref 98–111)
Chloride: 97 mmol/L — ABNORMAL LOW (ref 98–111)
Creatinine, Ser: 1.14 mg/dL (ref 0.61–1.24)
Creatinine, Ser: 1.05 mg/dL (ref 0.61–1.24)
Creatinine, Ser: 0.97 mg/dL (ref 0.61–1.24)
GFR calc Af Amer: >60 mL/min (ref >60)
GFR calc Af Amer: >60 mL/min (ref >60)
GFR calc Af Amer: >60 mL/min (ref >60)
GFR calc non Af Amer: >60 mL/min (ref >60)
GFR calc non Af Amer: >60 mL/min (ref >60)
GFR calc non Af Amer: >60 mL/min (ref >60)
Glucose, Bld: 578 mg/dL (ref 70–99)
Glucose, Bld: 401 mg/dL — ABNORMAL HIGH (ref 70–99)
Glucose, Bld: 310 mg/dL — ABNORMAL HIGH (ref 70–99)
Potassium: 3.9 mmol/L (ref 3.5–5.1)
Potassium: 3.8 mmol/L (ref 3.5–5.1)
Potassium: 3.9 mmol/L (ref 3.5–5.1)
Sodium: 129 mmol/L — ABNORMAL LOW (ref 135–145)
Sodium: 132 mmol/L — ABNORMAL LOW (ref 135–145)
Sodium: 135 mmol/L (ref 135–145)

## 2018-07-12 LAB — URINALYSIS, COMPLETE (UACMP) WITH MICROSCOPIC
Bacteria, UA: NONE SEEN
Bilirubin Urine: NEGATIVE
Glucose, UA: >=500 mg/dL — AB
Hgb urine dipstick: NEGATIVE
Ketones, ur: NEGATIVE mg/dL
Leukocytes,Ua: NEGATIVE
Nitrite: NEGATIVE
Protein, ur: NEGATIVE mg/dL
Specific Gravity, Urine: 1.02 (ref 1.005–1.030)
pH: 6 (ref 5.0–8.0)

## 2018-07-12 LAB — TROPONIN I
Troponin I: 0.18 ng/mL (ref <0.03)
Troponin I: 0.21 ng/mL (ref <0.03)
Troponin I: 0.22 ng/mL (ref <0.03)

## 2018-07-12 LAB — CBG MONITORING, ED
Glucose-Capillary: 390 mg/dL — ABNORMAL HIGH (ref 70–99)
Glucose-Capillary: 336 mg/dL — ABNORMAL HIGH (ref 70–99)

## 2018-07-12 LAB — MRSA PCR SCREENING: MRSA by PCR: NEGATIVE

## 2018-07-12 LAB — BRAIN NATRIURETIC PEPTIDE: B Natriuretic Peptide: 1082 pg/mL — ABNORMAL HIGH (ref 0.0–100.0)

## 2018-07-12 LAB — SARS CORONAVIRUS 2 BY RT PCR (HOSPITAL ORDER, PERFORMED IN ~~LOC~~ HOSPITAL LAB): SARS Coronavirus 2: NEGATIVE

## 2018-07-12 LAB — MAGNESIUM: Magnesium: 2.3 mg/dL (ref 1.7–2.4)

## 2018-07-12 MED ORDER — INSULIN REGULAR(HUMAN) IN NACL 100-0.9 UT/100ML-% IV SOLN: INTRAVENOUS | Status: DC

## 2018-07-12 MED ORDER — ALBUTEROL SULFATE HFA 108 (90 BASE) MCG/ACT IN AERS
2.0000 | INHALATION_SPRAY | Freq: Four times a day (QID) | RESPIRATORY_TRACT | Status: DC | PRN
  Administered 2018-07-13 – 2018-07-15 (×4): 2 via RESPIRATORY_TRACT
  Filled 2018-07-12 (×2): qty 6.7

## 2018-07-12 MED ORDER — PANTOPRAZOLE SODIUM 40 MG PO TBEC
40.0000 mg | DELAYED_RELEASE_TABLET | Freq: Every day | ORAL | Status: DC
  Administered 2018-07-13 – 2018-07-16 (×4): 40 mg via ORAL
  Filled 2018-07-12 (×4): qty 1

## 2018-07-12 MED ORDER — ROPINIROLE HCL 1 MG PO TABS
0.5000 mg | ORAL_TABLET | Freq: Every day | ORAL | Status: DC
  Administered 2018-07-12 – 2018-07-15 (×4): 1 mg via ORAL
  Filled 2018-07-12 (×5): qty 1

## 2018-07-12 MED ORDER — ENOXAPARIN SODIUM 40 MG/0.4ML ~~LOC~~ SOLN: 40.0000 mg | SUBCUTANEOUS | Status: DC

## 2018-07-12 MED ORDER — ENOXAPARIN SODIUM 60 MG/0.6ML ~~LOC~~ SOLN
50.0000 mg | SUBCUTANEOUS | Status: DC
  Administered 2018-07-12 – 2018-07-15 (×4): 50 mg via SUBCUTANEOUS
  Filled 2018-07-12 (×3): qty 0.5
  Filled 2018-07-12: qty 0.6
  Filled 2018-07-12: qty 0.5

## 2018-07-12 MED ORDER — OXYCODONE-ACETAMINOPHEN 10-325 MG PO TABS: 1.0000 | ORAL_TABLET | Freq: Four times a day (QID) | ORAL | Status: DC | PRN

## 2018-07-12 MED ORDER — FUROSEMIDE 10 MG/ML IJ SOLN
40.0000 mg | Freq: Two times a day (BID) | INTRAMUSCULAR | Status: DC
  Administered 2018-07-12 – 2018-07-13 (×2): 40 mg via INTRAVENOUS
  Filled 2018-07-12 (×2): qty 4

## 2018-07-12 MED ORDER — FUROSEMIDE 10 MG/ML IJ SOLN
40.0000 mg | Freq: Once | INTRAMUSCULAR | Status: AC
  Administered 2018-07-12: 40 mg via INTRAVENOUS
  Filled 2018-07-12: qty 4

## 2018-07-12 MED ORDER — POTASSIUM CHLORIDE 10 MEQ/100ML IV SOLN: 10.0000 meq | INTRAVENOUS | Status: DC

## 2018-07-12 MED ORDER — INSULIN ASPART 100 UNIT/ML ~~LOC~~ SOLN
0.0000 [IU] | Freq: Every day | SUBCUTANEOUS | Status: DC
  Administered 2018-07-12 – 2018-07-15 (×2): 2 [IU] via SUBCUTANEOUS

## 2018-07-12 MED ORDER — OXYCODONE HCL 5 MG PO TABS
5.0000 mg | ORAL_TABLET | Freq: Four times a day (QID) | ORAL | Status: DC | PRN
  Administered 2018-07-12 – 2018-07-16 (×9): 5 mg via ORAL
  Filled 2018-07-12 (×9): qty 1

## 2018-07-12 MED ORDER — INSULIN ASPART 100 UNIT/ML IV SOLN
8.0000 [IU] | Freq: Once | INTRAVENOUS | Status: AC
  Administered 2018-07-12: 8 [IU] via INTRAVENOUS

## 2018-07-12 MED ORDER — DEXTROSE-NACL 5-0.45 % IV SOLN
INTRAVENOUS | Status: DC
  Administered 2018-07-12: 17:00:00 via INTRAVENOUS

## 2018-07-12 MED ORDER — DEXTROSE-NACL 5-0.45 % IV SOLN: INTRAVENOUS | Status: DC

## 2018-07-12 MED ORDER — INSULIN REGULAR(HUMAN) IN NACL 100-0.9 UT/100ML-% IV SOLN
INTRAVENOUS | Status: DC
  Administered 2018-07-12: 3.3 [IU]/h via INTRAVENOUS
  Filled 2018-07-12: qty 100

## 2018-07-12 MED ORDER — INSULIN ASPART 100 UNIT/ML ~~LOC~~ SOLN
0.0000 [IU] | Freq: Three times a day (TID) | SUBCUTANEOUS | Status: DC
  Administered 2018-07-12: 3 [IU] via SUBCUTANEOUS
  Administered 2018-07-13: 08:00:00 7 [IU] via SUBCUTANEOUS
  Administered 2018-07-13: 3 [IU] via SUBCUTANEOUS
  Administered 2018-07-13 – 2018-07-14 (×2): 4 [IU] via SUBCUTANEOUS
  Administered 2018-07-14 (×2): 3 [IU] via SUBCUTANEOUS
  Administered 2018-07-15 (×2): 4 [IU] via SUBCUTANEOUS
  Administered 2018-07-15: 3 [IU] via SUBCUTANEOUS
  Administered 2018-07-16: 4 [IU] via SUBCUTANEOUS

## 2018-07-12 MED ORDER — SODIUM CHLORIDE 0.9 % IV SOLN: INTRAVENOUS | Status: DC

## 2018-07-12 MED ORDER — SODIUM CHLORIDE 0.9 % IV SOLN
INTRAVENOUS | Status: DC
  Administered 2018-07-12: 14:00:00 via INTRAVENOUS

## 2018-07-12 MED ORDER — OXYCODONE-ACETAMINOPHEN 5-325 MG PO TABS
1.0000 | ORAL_TABLET | Freq: Four times a day (QID) | ORAL | Status: DC | PRN
  Administered 2018-07-12 – 2018-07-16 (×9): 1 via ORAL
  Filled 2018-07-12 (×9): qty 1

## 2018-07-12 MED ORDER — GABAPENTIN 300 MG PO CAPS
900.0000 mg | ORAL_CAPSULE | Freq: Three times a day (TID) | ORAL | Status: DC
  Administered 2018-07-12: 900 mg via ORAL
  Filled 2018-07-12 (×3): qty 3

## 2018-07-12 MED ORDER — INSULIN GLARGINE 100 UNIT/ML ~~LOC~~ SOLN
25.0000 [IU] | Freq: Every day | SUBCUTANEOUS | Status: DC
  Administered 2018-07-12: 25 [IU] via SUBCUTANEOUS
  Filled 2018-07-12 (×2): qty 0.25

## 2018-07-12 MED ORDER — PRAVASTATIN SODIUM 10 MG PO TABS
20.0000 mg | ORAL_TABLET | Freq: Every day | ORAL | Status: DC
  Administered 2018-07-12 – 2018-07-13 (×2): 20 mg via ORAL
  Filled 2018-07-12 (×3): qty 2

## 2018-07-12 MED ORDER — POTASSIUM CHLORIDE 10 MEQ/100ML IV SOLN
10.0000 meq | INTRAVENOUS | Status: AC
  Administered 2018-07-12 (×2): 10 meq via INTRAVENOUS
  Filled 2018-07-12 (×2): qty 100

## 2018-07-12 MED ORDER — CITALOPRAM HYDROBROMIDE 20 MG PO TABS
20.0000 mg | ORAL_TABLET | Freq: Every day | ORAL | Status: DC
  Administered 2018-07-13 – 2018-07-16 (×4): 20 mg via ORAL
  Filled 2018-07-12 (×4): qty 1

## 2018-07-12 MED ORDER — CARVEDILOL 12.5 MG PO TABS
12.5000 mg | ORAL_TABLET | Freq: Two times a day (BID) | ORAL | Status: DC
  Administered 2018-07-12: 22:00:00 12.5 mg via ORAL
  Filled 2018-07-12: qty 1

## 2018-07-12 MED ORDER — ASPIRIN 81 MG PO CHEW
81.0000 mg | CHEWABLE_TABLET | Freq: Every day | ORAL | Status: DC
  Administered 2018-07-13 – 2018-07-16 (×3): 81 mg via ORAL
  Filled 2018-07-12 (×4): qty 1

## 2018-07-12 NOTE — H&P (Signed)
History and Physical  Shane Frye KVQ:[id number] DOB: 04-18-1965 DOA: 07/12/2018   PCP: Terald Sleeper, PA-C   Patient coming from: Home  Chief Complaint: dyspnea  HPI:  Shane Frye is a 53 y.o. male with medical history of coronary artery disease with CABG 2015, COPD, tobacco abuse, CHF, diabetes mellitus type 2, hyperlipidemia presenting with 3-day history of progressive shortness of breath that began on 07/09/2018.  Patient also endorses increasing lower extremity edema over the past week with increasing abdominal girth.  He went to [facility] on 07/11/2018.  The patient was given a dose of intravenous furosemide.  Apparently, the patient was frustrated with the care that he received, he went home from the emergency department.  It is unclear if he left AGAINST MEDICAL ADVICE.  Nevertheless, the patient stated that he continued to take his furosemide at home.  He endorses dietary and fluid indiscretion.  In addition, he also endorses poor compliance with his insulin.  He currently denies any chest discomfort today or yesterday, but states that he has had some intermittent chest discomfort in the past.  He denies any dizziness, syncope, nausea, vomiting, diarrhea, abdominal pain, dysuria.  He does endorse polydipsia and polyuria. In the emergency department, patient was afebrile hemodynamically stable saturating 97% on room air.  BMP showed a serum creatinine of 1.4 with serum glucose 578.  Anion gap was 17.  WBC was 14.2 with hemoglobin 13.2 and platelets 273,000.  Chest x-ray showed pulmonary vascular congestion.  The patient was started on IV furosemide.  Assessment/Plan: Hyperosmolar nonketotic state --patient started on IV insulin with q 1 hour CBG check and q 4 hour BMPs -pt started on judicious fluid resuscitation -Electrolytes were monitored and repleted -transitioned to [place] insulin once anion gap closed -diet was advanced once anion gap closed -HbA1C--  Acute CHF, type  unspecified -Work-up in progress -Continue furosemide -Daily weights -Accurate I's and O's -Echocardiogram  Diabetes mellitus type 2, uncontrolled with hyperglycemia -03/09/2018 hemoglobin A1c 7.7 -Repeat hemoglobin A1c -Holding metformin  Essential hypertension -Continue carvedilol -Holding lisinopril presently secondary to elevated serum creatinine  Elevated troponin -Likely demand ischemia in the setting of decompensated CHF and NKHS -Echocardiogram -Cycle troponins -EKG with nonspecific ST-T wave changes -10/2013-- 80% eccentric hazy proximal LAD stenosis followed by 80% stenosis of the first diagonal branch, 80% mid LAD stenosis, 50% proximal circumflex stenosis with an 80% OM1 stenosis and 90% OM 2 stenosis and a dominant right coronary artery with 95% mid PDA stenosis and 90% distal RCA PLA stenoses.  Intervention was not performed and he was evaluated by Dr. Roxan Hockey and underwent CABG surgery   Hyperlipidemia -Continue statin  Chronic pain -Continue home dose Percocet, gabapentin, Celexa  History cocaine abuse -check UDS  Post-operative paroxsymal Afib -not on [facility] -currently in sinus      Past Medical History:  Diagnosis Date   Anxiety    COPD (chronic obstructive pulmonary disease) ([place])    Depression    Diabetes mellitus    GERD (gastroesophageal reflux disease)    High cholesterol    Hypertension    Insomnia    Myocardial infarction ([place]) 11/04/13   Neuropathy    Past Surgical History:  Procedure Laterality Date   CARDIOVERSION N/A 11/06/2013   Procedure: CARDIOVERSION;  Surgeon: Thayer Headings, MD;  Location: [place];  Service: Cardiovascular;  Laterality: N/A;   CARPAL TUNNEL RELEASE  04/2015   COLONOSCOPY N/A 05/11/2014   Procedure: COLONOSCOPY;  Surgeon: Bernadene Person  Gloriann Loan, MD;  [facility];  Service: Endoscopy;  Laterality: N/A;  930   CORONARY ARTERY BYPASS GRAFT N/A 11/09/2013   Procedure: CORONARY ARTERY BYPASS  GRAFTING (CABG);  Surgeon: Melrose Nakayama, MD;  Location: [place];  Service: Open Heart Surgery;  Laterality: N/A;   INTRAOPERATIVE TRANSESOPHAGEAL ECHOCARDIOGRAM N/A 11/09/2013   Procedure: INTRAOPERATIVE TRANSESOPHAGEAL ECHOCARDIOGRAM;  Surgeon: Melrose Nakayama, MD;  Location: [place];  Service: Open Heart Surgery;  Laterality: N/A;   LEFT HEART CATHETERIZATION WITH CORONARY ANGIOGRAM N/A 11/04/2013   Procedure: LEFT HEART CATHETERIZATION WITH CORONARY ANGIOGRAM;  Surgeon: Troy Sine, MD;  Location: [facility] CATH LAB;  Service: Cardiovascular;  Laterality: N/A;   NO PAST SURGERIES     Social History:  reports that he has been smoking cigarettes. He has been smoking about 0.50 packs per day. He quit smokeless tobacco use about 4 years ago. He reports that he does not drink alcohol or use drugs.   Family History  Problem Relation Age of Onset   Hypertension Paternal Grandfather    Hypertension Brother    Diabetes Brother    Neuropathy Neg Hx      No Known Allergies   Prior to Admission medications   Medication Sig Start Date End Date Taking? Authorizing Provider  albuterol (VENTOLIN HFA) 108 (90 Base) MCG/ACT inhaler Inhale 2 puffs into the lungs every 6 (six) hours as needed for wheezing or shortness of breath. 06/29/18   Terald Sleeper, PA-C  aspirin 81 MG tablet Take 81 mg by mouth daily.    [provider]  benzonatate (TESSALON) 100 MG capsule Take 1 capsule by mouth 4 (four) times daily as needed. 06/14/18   [provider]  carvedilol (COREG) 12.5 MG tablet Take 1 tablet (12.5 mg total) by mouth 2 (two) times daily. 11/20/17   Terald Sleeper, PA-C  citalopram (CELEXA) 20 MG tablet Take 1 tablet (20 mg total) by mouth daily. 11/20/17   Terald Sleeper, PA-C  furosemide (LASIX) 20 MG tablet Take 1 tablet (20 mg total) by mouth every morning. 06/29/18   Terald Sleeper, PA-C  gabapentin (NEURONTIN) 300 MG capsule Take 3 capsules (900 mg total) by mouth 3 (three)  times daily. Slowly increase as discussed. 11/20/17   Terald Sleeper, PA-C  glucose blood test strip Use as instructed 08/07/14   Wardell Honour, MD  insulin aspart protamine- aspart (NOVOLOG MIX 70/30) (70-30) 100 UNIT/ML injection Inject 0.15-0.3 mLs (15-30 Units total) into the skin 3 (three) times daily. 03/09/18   Terald Sleeper, PA-C  Insulin Syringe-Needle U-100 (RELION INSULIN SYR 0.3ML/31G) 31G X 5/16" 0.3 ML MISC USE ONE SYRINGE EACH TIME THREE TIMES DAILY 11/20/17   Terald Sleeper, PA-C  lisinopril (PRINIVIL,ZESTRIL) 20 MG tablet Take 1 tablet (20 mg total) by mouth 2 (two) times daily. 11/20/17   Terald Sleeper, PA-C  lovastatin (MEVACOR) 20 MG tablet Take 2 tablets (40 mg total) by mouth at bedtime. 11/20/17   Terald Sleeper, PA-C  metFORMIN (GLUCOPHAGE) 1000 MG tablet Take 1 tablet (1,000 mg total) by mouth 2 (two) times daily with a meal. 11/20/17   Terald Sleeper, PA-C  oxyCODONE-acetaminophen (PERCOCET) 10-325 MG tablet Take 1 tablet by mouth every 8 (eight) hours as needed for pain. CHRONIC PAIN 06/29/18   Terald Sleeper, PA-C  oxyCODONE-acetaminophen (PERCOCET) 10-325 MG tablet Take 1 tablet by mouth every 6 (six) hours as needed for pain. CHRONIC PAIN 06/29/18  Terald Sleeper, PA-C  oxyCODONE-acetaminophen (PERCOCET) 10-325 MG tablet Take 1 tablet by mouth every 4 (four) hours as needed for pain. CHRONIC PAIN 06/29/18   Terald Sleeper, PA-C  pantoprazole (PROTONIX) 40 MG tablet Take 1 tablet (40 mg total) by mouth daily. 11/20/17   Terald Sleeper, PA-C  rOPINIRole (REQUIP) 0.5 MG tablet Take 1-2 tablets (0.5-1 mg total) by mouth at bedtime. 11/20/17   Terald Sleeper, PA-C    Review of Systems:  Constitutional:  No weight loss, night sweats, Fevers, chills, fatigue.  Head&Eyes: No headache.  No vision loss.  No eye pain or scotoma ENT:  No Difficulty swallowing,Tooth/dental problems,Sore throat,  No ear ache, post nasal drip,  Cardio-vascular:  No Orthopnea, PND,  dizziness,  palpitations  GI:  No  abdominal pain, nausea, vomiting, diarrhea, loss of appetite, hematochezia, melena, heartburn, indigestion, Resp:   No coughing up of blood .No wheezing.No chest wall deformity  Skin:  no rash or lesions.  GU:  no dysuria, change in color of urine, no urgency or frequency. No flank pain.  Musculoskeletal:  No joint pain or swelling. No decreased range of motion. No back pain.  Psych:  No change in mood or affect. No depression or anxiety. Neurologic: No headache, no dysesthesia, no focal weakness, no vision loss. No syncope  Physical Exam: Vitals:   07/12/18 0933 07/12/18 0935 07/12/18 1000  BP:  (!) 156/110 (!) 136/98  Pulse:  (!) 106 (!) 111  Resp:  18 20  Temp:  97.7 F (36.5 C)   TempSrc:  Oral   SpO2:  96% 97%  Weight: 106.6 kg    Height: 5\' 9"  (1.753 m)     General:  A&O x 3, NAD, nontoxic, pleasant/cooperative Head/Eye: No conjunctival hemorrhage, no icterus, [place]/AT, No nystagmus ENT:  No icterus,  No thrush, good dentition, no pharyngeal exudate Neck:  No masses, no lymphadenpathy, no bruits CV:  RRR, no rub, no gallop, no S3 Lung:  Diminished breath sounds.  Fine bibasilar rales Abdomen: soft/NT, +BS, nondistended, no peritoneal signs Ext: No cyanosis, No rashes, No petechiae, No lymphangitis, 1 + LE edema Neuro: CNII-XII intact, strength 4/5 in bilateral upper and lower extremities, no dysmetria  Labs on Admission:  Basic Metabolic Panel: Recent Labs  Lab 07/12/18 1004  NA 129*  K 3.9  CL 91*  CO2 21*  GLUCOSE 578*  BUN 23*  CREATININE 1.14  CALCIUM 8.3*   Liver Function Tests: No results for input(s): AST, ALT, ALKPHOS, BILITOT, PROT, ALBUMIN in the last 168 hours. No results for input(s): LIPASE, AMYLASE in the last 168 hours. No results for input(s): AMMONIA in the last 168 hours. CBC: Recent Labs  Lab 07/12/18 1004  WBC 14.2*  NEUTROABS 12.4*  HGB 13.2  HCT 40.9  MCV 82.5  PLT 273   Coagulation Profile: No  results for input(s): INR, PROTIME in the last 168 hours. Cardiac Enzymes: Recent Labs  Lab 07/12/18 1004  TROPONINI 0.18*   BNP: Invalid input(s): POCBNP CBG: No results for input(s): GLUCAP in the last 168 hours. Urine analysis:    Component Value Date/Time   COLORURINE YELLOW 11/08/2013 [place] 11/08/2013 1549   LABSPEC 1.023 11/08/2013 1549   PHURINE 5.5 11/08/2013 1549   GLUCOSEU NEGATIVE 11/08/2013 1549   HGBUR NEGATIVE 11/08/2013 1549   BILIRUBINUR NEGATIVE 11/08/2013 1549   KETONESUR 15 (A) 11/08/2013 1549   PROTEINUR NEGATIVE 11/08/2013 1549   UROBILINOGEN 1.0 11/08/2013 1549  NITRITE NEGATIVE 11/08/2013 1549   LEUKOCYTESUR NEGATIVE 11/08/2013 1549   Sepsis Labs: @LABRCNTIP (procalcitonin:4,lacticidven:4) )No results found for this or any previous visit (from the past 240 hour(s)).   Radiological Exams on Admission: Dg Chest Portable 1 View  Result Date: 07/12/2018 CLINICAL DATA:  Shortness of breath. EXAM: PORTABLE CHEST 1 VIEW COMPARISON:  07/11/2018 FINDINGS: Sequelae of CABG are again identified. The cardiac silhouette is mildly enlarged. Mild central pulmonary vascular congestion is again noted without overt edema. No airspace consolidation, sizeable pleural effusion, or pneumothorax is identified. No acute osseous abnormality is seen. IMPRESSION: Cardiomegaly and mild pulmonary vascular congestion. Electronically Signed   By: Logan Bores M.D.   On: 07/12/2018 10:19    EKG: Independently reviewed. Sinus, nonspecific STT changes    Time spent:60 minutes Code Status:   FULL Family Communication:  No Family at bedside Disposition Plan: expect 2-3 day hospitalization Consults called: none DVT Prophylaxis: Sun Lovenox  Edin Kon, DO  [facility] Pager [phone]  If 7PM-7AM, please contact night-coverage [email or web address] Password [facility] 07/12/2018, 12:46 PM

## 2018-07-12 NOTE — ED Triage Notes (Signed)
Pt reports he has been short of breath especially yesterday morning.  States he went to [facility] yesterday and was seen and "forgotten about" due to shift change and asked to leave. Took extra "fluid pill" at 3 am.

## 2018-07-12 NOTE — ED Notes (Addendum)
Patient states he did Crack Cocaine that was slipped into a joint by a friend. Denies knowledge that Cocaine was in the Marijuana joint. States this occurred 3 or 4 days ago. Does not want family to be updated on positive Cocaine results.

## 2018-07-12 NOTE — ED Notes (Signed)
Date and time results received: 07/12/18 1059 (use smartphrase ".now" to insert current time)  Test:Glucose/Troponin Critical Value: Glucose 578 Troponin 0.18 Name of Provider Notified: Dr Stark Jock  Orders Received? Or Actions Taken?: na

## 2018-07-12 NOTE — Progress Notes (Signed)
CRITICAL VALUE ALERT  Critical Value:  Troponin 0.21  Date & Time Notied:  5/25 @ 5500  Provider Notified: Dr. Carles Collet  Orders Received/Actions taken: See chart

## 2018-07-12 NOTE — ED Notes (Signed)
Patient requested a banana, told pt he is NPO at this time.

## 2018-07-12 NOTE — ED Provider Notes (Signed)
[facility] EMERGENCY DEPARTMENT Provider Note   CSN: [id number] Arrival date & time: 07/12/18  3762    History   Chief Complaint Chief Complaint  Patient presents with  . Shortness of Breath    HPI Shane Frye is a 53 y.o. male.     Patient is a 53 year old male with extensive past medical history including coronary artery disease with CABG, CHF, COPD, diabetes, hypertension.  He presents today for evaluation of shortness of breath.  This is worsened over the past several days.  He reports leg swelling as well.  Patient was seen yesterday at [facility].  He was given additional Lasix, then discharged.  He returns here stating that his breathing is not improving.  He denies any fevers or chills.  He denies any productive cough.  He denies any ill contacts.  He tells me he did have a coronavirus test performed at [facility] which was negative.  The history is provided by the patient.  Shortness of Breath  Severity:  Moderate Onset quality:  Gradual Duration:  3 days Timing:  Constant Progression:  Worsening Chronicity:  Recurrent Context: activity   Relieved by:  Nothing Worsened by:  Nothing Ineffective treatments:  None tried Associated symptoms: no cough, no fever and no sputum production     Past Medical History:  Diagnosis Date  . Anxiety   . COPD (chronic obstructive pulmonary disease) ([place])   . Depression   . Diabetes mellitus   . GERD (gastroesophageal reflux disease)   . High cholesterol   . Hypertension   . Insomnia   . Myocardial infarction ([place]) 11/04/13  . Neuropathy     Patient Active Problem List   Diagnosis Date Noted  . Diabetes mellitus without complication ([place]) 83/15/1761  . Pain management contract agreement 12/27/2015  . Lumbar disc disease with radiculopathy 11/27/2015  . OSA (obstructive sleep apnea) 07/21/2015  . Hypertension   . GERD (gastroesophageal reflux disease)   . Diabetes ([place])   . Insomnia   . Neuropathy   .  Rotator cuff tear 01/30/2014  . Dyspnea on exertion 12/26/2013  . Smoker 12/26/2013  . PAF (paroxysmal atrial fibrillation) ([place]) 12/26/2013  . 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 STEMI (ST elevation myocardial infarction) ([place]) 11/04/2013  . DM (diabetes mellitus) ([place]) 11/04/2013  . HLD (hyperlipidemia) 11/04/2013  . Obesity 11/04/2013  . Cocaine abuse ([place]) 11/04/2013  . H/O noncompliance with medical treatment, presenting hazards to health 11/04/2013  . CAD (coronary artery disease), native coronary artery 11/04/2013    Past Surgical History:  Procedure Laterality Date  . CARDIOVERSION N/A 11/06/2013   Procedure: CARDIOVERSION;  Surgeon: Thayer Headings, MD;  Location: [place];  Service: Cardiovascular;  Laterality: N/A;  . CARPAL TUNNEL RELEASE  04/2015  . COLONOSCOPY N/A 05/11/2014   Procedure: COLONOSCOPY;  Surgeon: Rogene Houston, MD;  [facility];  Service: Endoscopy;  Laterality: N/A;  930  . CORONARY ARTERY BYPASS GRAFT N/A 11/09/2013   Procedure: CORONARY ARTERY BYPASS GRAFTING (CABG);  Surgeon: Melrose Nakayama, MD;  Location: [place];  Service: Open Heart Surgery;  Laterality: N/A;  . INTRAOPERATIVE TRANSESOPHAGEAL ECHOCARDIOGRAM N/A 11/09/2013   Procedure: INTRAOPERATIVE TRANSESOPHAGEAL ECHOCARDIOGRAM;  Surgeon: Melrose Nakayama, MD;  Location: [place];  Service: Open Heart Surgery;  Laterality: N/A;  . LEFT HEART CATHETERIZATION WITH CORONARY ANGIOGRAM N/A 11/04/2013   Procedure: LEFT HEART CATHETERIZATION WITH CORONARY ANGIOGRAM;  Surgeon: Troy Sine, MD;  Location: [facility] CATH LAB;  Service:  Cardiovascular;  Laterality: N/A;  . NO PAST SURGERIES          Home Medications    Prior to Admission medications   Medication Sig Start Date End Date Taking? Authorizing Provider  albuterol (VENTOLIN HFA) 108 (90 Base) MCG/ACT inhaler Inhale 2 puffs into the lungs every 6 (six) hours as needed for wheezing or shortness of breath. 06/29/18   Terald Sleeper, PA-C  aspirin  81 MG tablet Take 81 mg by mouth daily.    [provider]  carvedilol (COREG) 12.5 MG tablet Take 1 tablet (12.5 mg total) by mouth 2 (two) times daily. 11/20/17   Terald Sleeper, PA-C  citalopram (CELEXA) 20 MG tablet Take 1 tablet (20 mg total) by mouth daily. 11/20/17   Terald Sleeper, PA-C  furosemide (LASIX) 20 MG tablet Take 1 tablet (20 mg total) by mouth every morning. 06/29/18   Terald Sleeper, PA-C  gabapentin (NEURONTIN) 300 MG capsule Take 3 capsules (900 mg total) by mouth 3 (three) times daily. Slowly increase as discussed. 11/20/17   Terald Sleeper, PA-C  glucose blood test strip Use as instructed 08/07/14   Wardell Honour, MD  insulin aspart protamine- aspart (NOVOLOG MIX 70/30) (70-30) 100 UNIT/ML injection Inject 0.15-0.3 mLs (15-30 Units total) into the skin 3 (three) times daily. 03/09/18   Terald Sleeper, PA-C  Insulin Syringe-Needle U-100 (RELION INSULIN SYR 0.3ML/31G) 31G X 5/16" 0.3 ML MISC USE ONE SYRINGE EACH TIME THREE TIMES DAILY 11/20/17   Terald Sleeper, PA-C  lisinopril (PRINIVIL,ZESTRIL) 20 MG tablet Take 1 tablet (20 mg total) by mouth 2 (two) times daily. 11/20/17   Terald Sleeper, PA-C  lovastatin (MEVACOR) 20 MG tablet Take 2 tablets (40 mg total) by mouth at bedtime. 11/20/17   Terald Sleeper, PA-C  metFORMIN (GLUCOPHAGE) 1000 MG tablet Take 1 tablet (1,000 mg total) by mouth 2 (two) times daily with a meal. 11/20/17   Terald Sleeper, PA-C  oxyCODONE-acetaminophen (PERCOCET) 10-325 MG tablet Take 1 tablet by mouth every 8 (eight) hours as needed for pain. CHRONIC PAIN 06/29/18   Terald Sleeper, PA-C  oxyCODONE-acetaminophen (PERCOCET) 10-325 MG tablet Take 1 tablet by mouth every 6 (six) hours as needed for pain. CHRONIC PAIN 06/29/18   Terald Sleeper, PA-C  oxyCODONE-acetaminophen (PERCOCET) 10-325 MG tablet Take 1 tablet by mouth every 4 (four) hours as needed for pain. CHRONIC PAIN 06/29/18   Terald Sleeper, PA-C  pantoprazole (PROTONIX) 40 MG tablet Take 1  tablet (40 mg total) by mouth daily. 11/20/17   Terald Sleeper, PA-C  rOPINIRole (REQUIP) 0.5 MG tablet Take 1-2 tablets (0.5-1 mg total) by mouth at bedtime. 11/20/17   Terald Sleeper, PA-C    Family History Family History  Problem Relation Age of Onset  . Hypertension Paternal Grandfather   . Hypertension Brother   . Diabetes Brother   . Neuropathy Neg Hx     Social History Social History   Tobacco Use  . Smoking status: Current Some Day Smoker    Packs/day: 0.50    Types: Cigarettes    Last attempt to quit: 11/10/2013    Years since quitting: 4.6  . Smokeless tobacco: Former Systems developer    Quit date: 11/04/2013  Substance Use Topics  . Alcohol use: No    Alcohol/week: 2.0 standard drinks    Types: 2 Cans of beer per week  . Drug use: No    Comment: cocaine over 3  years ago     Allergies   Patient has no known allergies.   Review of Systems Review of Systems  Constitutional: Negative for fever.  Respiratory: Positive for shortness of breath. Negative for cough and sputum production.   All other systems reviewed and are negative.    Physical Exam Updated Vital Signs BP (!) 156/110 (BP Location: Right Arm)   Pulse (!) 106   Temp 97.7 F (36.5 C) (Oral)   Resp 18   Ht 5\' 9"  (1.753 m)   Wt 106.6 kg   SpO2 96%   BMI 34.70 kg/m   Physical Exam Vitals signs and nursing note reviewed.  Constitutional:      General: He is not in acute distress.    Appearance: He is well-developed. He is not diaphoretic.  HENT:     Head: Normocephalic and atraumatic.  Neck:     Musculoskeletal: Normal range of motion and neck supple.  Cardiovascular:     Rate and Rhythm: Normal rate and regular rhythm.     Heart sounds: No murmur. No friction rub.  Pulmonary:     Effort: Pulmonary effort is normal. No respiratory distress.     Breath sounds: Normal breath sounds. No wheezing or rales.  Abdominal:     General: Bowel sounds are normal. There is no distension.     Palpations:  Abdomen is soft.     Tenderness: There is no abdominal tenderness.  Musculoskeletal: Normal range of motion.     Right lower leg: He exhibits no tenderness. Edema present.     Left lower leg: He exhibits no tenderness. Edema present.     Comments: There is trace edema of both lower extremities.  Skin:    General: Skin is warm and dry.  Neurological:     Mental Status: He is alert and oriented to person, place, and time.     Coordination: Coordination normal.      ED Treatments / Results  Labs (all labs ordered are listed, but only abnormal results are displayed) Labs Reviewed  BASIC METABOLIC PANEL  CBC WITH DIFFERENTIAL/PLATELET  TROPONIN I  BRAIN NATRIURETIC PEPTIDE    EKG EKG Interpretation  Date/Time:  Monday Jul 12 2018 09:37:28 EDT Ventricular Rate:  105 PR Interval:    QRS Duration: 128 QT Interval:  372 QTC Calculation: 492 R Axis:   80 Text Interpretation:  Sinus tachycardia Nonspecific intraventricular conduction delay Consider anterior infarct Nonspecific T abnormalities, lateral leads No specific change from 06/08/2014 Confirmed by Veryl Speak [phone]) on 07/12/2018 9:54:23 AM   Radiology No results found.  Procedures Procedures (including critical care time)  Medications Ordered in ED Medications  furosemide (LASIX) injection 40 mg (has no administration in time range)     Initial Impression / Assessment and Plan / ED Course  I have reviewed the triage vital signs and the nursing notes.  Pertinent labs & imaging results that were available during my care of the patient were reviewed by me and considered in my medical decision making (see chart for details).  Patient's work-up consistent with an exacerbation of CHF.  He has an elevated BNP and also a mildly elevated troponin.  Patient given IV Lasix in the ER with good diuresis.  He also has a blood sugar greater than 500.  IV insulin given for this.  Patient care discussed with Dr. Carles Collet from the  hospitalist service who will evaluate and admit.  Patient will likely require further diuresis and tighter control of his  blood sugar.  Final Clinical Impressions(s) / ED Diagnoses   Final diagnoses:  None    ED Discharge Orders    None       Veryl Speak, MD 07/12/18 1354

## 2018-07-13 ENCOUNTER — Inpatient Hospital Stay (HOSPITAL_COMMUNITY): Payer: Medicare HMO

## 2018-07-13 DIAGNOSIS — I5021 Acute systolic (congestive) heart failure: Secondary | ICD-10-CM

## 2018-07-13 DIAGNOSIS — I5043 Acute on chronic combined systolic (congestive) and diastolic (congestive) heart failure: Secondary | ICD-10-CM

## 2018-07-13 LAB — CBC
HCT: 43 % (ref 39.0–52.0)
Hemoglobin: 13.4 g/dL (ref 13.0–17.0)
MCH: 26.5 pg (ref 26.0–34.0)
MCHC: 31.2 g/dL (ref 30.0–36.0)
MCV: 85 fL (ref 80.0–100.0)
Platelets: 277 10*3/uL (ref 150–400)
RBC: 5.06 MIL/uL (ref 4.22–5.81)
RDW: 13.4 % (ref 11.5–15.5)
WBC: 15.4 10*3/uL — ABNORMAL HIGH (ref 4.0–10.5)
nRBC: 0 % (ref 0.0–0.2)

## 2018-07-13 LAB — BASIC METABOLIC PANEL
Anion gap: 11 (ref 5–15)
BUN: 22 mg/dL — ABNORMAL HIGH (ref 6–20)
CO2: 27 mmol/L (ref 22–32)
Calcium: 8.1 mg/dL — ABNORMAL LOW (ref 8.9–10.3)
Chloride: 98 mmol/L (ref 98–111)
Creatinine, Ser: 0.95 mg/dL (ref 0.61–1.24)
GFR calc Af Amer: >60 mL/min (ref >60)
GFR calc non Af Amer: >60 mL/min (ref >60)
Glucose, Bld: 186 mg/dL — ABNORMAL HIGH (ref 70–99)
Potassium: 3.8 mmol/L (ref 3.5–5.1)
Sodium: 136 mmol/L (ref 135–145)

## 2018-07-13 LAB — GLUCOSE, CAPILLARY
Glucose-Capillary: 201 mg/dL — ABNORMAL HIGH (ref 70–99)
Glucose-Capillary: 146 mg/dL — ABNORMAL HIGH (ref 70–99)
Glucose-Capillary: 217 mg/dL — ABNORMAL HIGH (ref 70–99)
Glucose-Capillary: 194 mg/dL — ABNORMAL HIGH (ref 70–99)
Glucose-Capillary: 175 mg/dL — ABNORMAL HIGH (ref 70–99)

## 2018-07-13 LAB — ECHOCARDIOGRAM COMPLETE
Height: 69 in
Weight: 3552.05 oz

## 2018-07-13 LAB — MAGNESIUM: Magnesium: 2.3 mg/dL (ref 1.7–2.4)

## 2018-07-13 MED ORDER — CARVEDILOL 6.25 MG PO TABS
6.2500 mg | ORAL_TABLET | Freq: Two times a day (BID) | ORAL | Status: DC
  Administered 2018-07-13 – 2018-07-16 (×7): 6.25 mg via ORAL
  Filled 2018-07-13 (×6): qty 1
  Filled 2018-07-13: qty 2

## 2018-07-13 MED ORDER — SODIUM CHLORIDE 0.9 % IV SOLN
INTRAVENOUS | Status: DC
  Administered 2018-07-14: 06:00:00 via INTRAVENOUS

## 2018-07-13 MED ORDER — ACETAMINOPHEN 325 MG PO TABS: 650.0000 mg | ORAL_TABLET | Freq: Four times a day (QID) | ORAL | Status: DC | PRN

## 2018-07-13 MED ORDER — LIVING WELL WITH DIABETES BOOK
Freq: Once | Status: AC
  Administered 2018-07-13: 1

## 2018-07-13 MED ORDER — INSULIN ASPART 100 UNIT/ML ~~LOC~~ SOLN
3.0000 [IU] | Freq: Three times a day (TID) | SUBCUTANEOUS | Status: DC
  Administered 2018-07-13 – 2018-07-16 (×7): 3 [IU] via SUBCUTANEOUS

## 2018-07-13 MED ORDER — SODIUM CHLORIDE 0.9% FLUSH: 3.0000 mL | INTRAVENOUS | Status: DC | PRN

## 2018-07-13 MED ORDER — ASPIRIN 81 MG PO CHEW
81.0000 mg | CHEWABLE_TABLET | ORAL | Status: AC
  Administered 2018-07-14: 81 mg via ORAL
  Filled 2018-07-13: qty 1

## 2018-07-13 MED ORDER — INSULIN GLARGINE 100 UNIT/ML ~~LOC~~ SOLN
30.0000 [IU] | Freq: Every day | SUBCUTANEOUS | Status: DC
  Administered 2018-07-13 – 2018-07-15 (×3): 30 [IU] via SUBCUTANEOUS
  Filled 2018-07-13 (×5): qty 0.3

## 2018-07-13 MED ORDER — INSULIN GLARGINE 100 UNIT/ML ~~LOC~~ SOLN
25.0000 [IU] | Freq: Every day | SUBCUTANEOUS | Status: DC
  Filled 2018-07-13: qty 0.25

## 2018-07-13 MED ORDER — FUROSEMIDE 40 MG PO TABS: 40.0000 mg | ORAL_TABLET | Freq: Every day | ORAL | Status: DC

## 2018-07-13 MED ORDER — ONDANSETRON HCL 4 MG/2ML IJ SOLN
4.0000 mg | Freq: Four times a day (QID) | INTRAMUSCULAR | Status: DC | PRN
  Administered 2018-07-14: 4 mg via INTRAVENOUS
  Filled 2018-07-13: qty 2

## 2018-07-13 MED ORDER — SODIUM CHLORIDE 0.9 % IV SOLN: 250.0000 mL | INTRAVENOUS | Status: DC | PRN

## 2018-07-13 MED ORDER — SODIUM CHLORIDE 0.9% FLUSH
3.0000 mL | Freq: Two times a day (BID) | INTRAVENOUS | Status: DC
  Administered 2018-07-13 – 2018-07-15 (×2): 3 mL via INTRAVENOUS

## 2018-07-13 NOTE — Progress Notes (Signed)
Inpatient Diabetes Program Recommendations  AACE/ADA: New Consensus Statement on Inpatient Glycemic Control (2015)  Target Ranges:  Prepandial:   less than 140 mg/dL      Peak postprandial:   less than 180 mg/dL (1-2 hours)      Critically ill patients:  140 - 180 mg/dL   Lab Results  Component Value Date   GLUCAP 201 (H) 07/13/2018   HGBA1C 7.7 (H) 03/09/2018    Review of Glycemic Control Results for Shane Frye, Shane Frye (MRN [id number]) as of 07/13/2018 09:34  Ref. Range 07/12/2018 13:24 07/12/2018 15:07 07/12/2018 16:23 07/12/2018 17:20 07/12/2018 18:22 07/12/2018 21:22 07/13/2018 07:26  Glucose-Capillary Latest Ref Range: 70 - 99 mg/dL 390 (H) 336 (H) 240 (H) 238 (H) 142 (H) 213 (H) 201 (H)   Diabetes history: DM2 Outpatient Diabetes medications: Novolog 70/30 insulin mix 20 units tid Current orders for Inpatient glycemic control: Lantus 25 units + Novolog resistant correction tid + hs 0-5 units  Inpatient Diabetes Program Recommendations:    -Novolog 5 units tid meal coverage if eats 50%  Spoke with patient by phone (DM coordinator working remotely). Patient states he has mainly been taking his insulin ac lunch due to "family issues" affecting his behavior. Reviewed with patient risk of elevated blood glucose and reviewed what a A1c is and is currently pending results. Patient states he had appointment with Dr. Ronnald Ramp who manages his diabetes but did not disclose he wasn't taking his insulin as prescribed. Patient states he has family issues "taken care of" and knows he needs to take his medications and check his CBGs. States he needs a glucometer for home.  Discharge needs: -Glucose meter kit #[id number]  Thank you, Nani Gasser. Bristal Steffy, RN, MSN, CDE  Diabetes Coordinator Inpatient Glycemic Control Team Team Pager [phone] (8am-5pm) 07/13/2018 10:35 AM

## 2018-07-13 NOTE — Progress Notes (Signed)
Patient alert and oriented x4. Currently has no complaints of pain, shortness of breath, chest pain, dizziness, nausea or vomiting. Patient tolerated diet and PO meds well. "Living with diabetes" book given to patient and went over. Patient education provided on CHF and measuring fluid intake. Patient expressed full understanding but had a little difficulty with teach back and will need some further education. Patient up out of bed to commode with supervision, gait steady. Nurse to nurse report called and given to [place] and Jarrett Soho updated on [facility] [address] unit at [facility]. Patient sons called and made aware of transfer per patient request. Patient transferred to [facility] with all belongings via care link.

## 2018-07-13 NOTE — Progress Notes (Signed)
*  PRELIMINARY RESULTS* Echocardiogram 2D Echocardiogram has been performed.  Samuel Germany 07/13/2018, 9:54 AM

## 2018-07-13 NOTE — Progress Notes (Addendum)
PROGRESS NOTE  Shane Frye:[id number] DOB: 1965/04/03 DOA: 07/12/2018 PCP: Terald Sleeper, PA-C  Brief History:  53 y.o. male with medical history of coronary artery disease with CABG 2015, COPD, tobacco abuse, CHF, diabetes mellitus type 2, hyperlipidemia presenting with 3-day history of progressive shortness of breath that began on 07/09/2018.  Patient also endorses increasing lower extremity edema over the past week with increasing abdominal girth.  He went to [facility] on 07/11/2018.  The patient was given a dose of intravenous furosemide.  Apparently, the patient was frustrated with the care that he received, he went home from the emergency department.  It is unclear if he left AGAINST MEDICAL ADVICE.  Nevertheless, the patient stated that he continued to take his furosemide at home.  He endorses dietary and fluid indiscretion.  In addition, he also endorses poor compliance with his insulin.  He currently denies any chest discomfort today or yesterday, but states that he has had some intermittent chest discomfort in the past.  In the emergency department, patient was afebrile hemodynamically stable saturating 97% on room air.  BMP showed a serum creatinine of 1.4 with serum glucose 578.  Anion gap was 17.  WBC was 14.2 with hemoglobin 13.2 and platelets 273,000.  Chest x-ray showed pulmonary vascular congestion.  The patient was started on IV furosemide with good clinical effect.  Echo showed EF 25-30%.  Cardiology was consulted and recommended transfer to [facility] for heart cath.  Assessment/Plan: Hyperosmolar nonketotic state --patient started on IV insulin with q 1 hour CBG check and q 4 hour BMPs -pt started on judicious fluid resuscitation -Electrolytes were monitored and repleted -transitioned to [place] insulin once anion gap closed -diet was advanced once anion gap closed -HbA1C-- -Increase Lantus to 30 units  Acute Systolic CHF -Work-up in progress -Continue furosemide -Daily  weights--NEG 3.9 lbs -Accurate I's and O's -Echocardiogram--EF 25-30%, moderate decreasd RV function -consult cardiology--discussed with Dr. Francoise Ceo to [facility] for heart cath  Diabetes mellitus type 2, uncontrolled with hyperglycemia -03/09/2018 hemoglobin A1c 7.7 -Repeat hemoglobin A1c -Holding metformin -increase lantus to 30 units -add pre-meal insulin  Essential hypertension -Continue carvedilol -Holding lisinopril presently secondary to elevated serum creatinine  Elevated troponin -Likely demand ischemia in the setting of decompensated CHF and NKHS -Echocardiogram--EF 25-30%, moderate decreasd RV function -Cycle troponins--trend is flat -EKG with nonspecific ST-T wave changes -10/2013--80% eccentric hazy proximal LAD stenosis followed by 80% stenosis of the first diagonal branch, 80% mid LAD stenosis, 50% proximal circumflex stenosis with an 80% OM1 stenosis and 90% OM 2 stenosis and a dominant right coronary artery with 95% mid PDA stenosis and 90% distal RCA PLA stenoses. Intervention was not performed and he was evaluated by Dr. Roxan Hockey and underwent CABG surgery   Hyperlipidemia -Continue statin  Chronic pain -Continue home dose Percocet, gabapentin, Celexa  cocaine abuse -check UDS--positive cocaine -stable on BB with alpha activity  Post-operative paroxsymal Afib -not on AC -currently in sinus  Leukocytosis -UA neg -personally reviewed CXR--no consolidation; increase interstitial markings -remain off abx and monitor clinically -he is afebrile and hemodynamically stable     Disposition Plan:   Transfer to [facility] Family Communication:   No Family at bedside  Consultants:  none  Code Status:  FULL  DVT Prophylaxis:  [place] Lovenox   Procedures: As Listed in Progress Note Above  Antibiotics: None      Subjective: Patient denies fevers, chills, headache, chest pain, dyspnea,  nausea, vomiting, diarrhea, abdominal pain,  dysuria, hematuria, hematochezia, and melena.   Objective: Vitals:   07/13/18 0803 07/13/18 0811 07/13/18 0900 07/13/18 0915  BP: (!) 129/96  122/83 122/83  Pulse:    81  Resp: 17  15   Temp:      TempSrc:      SpO2:  98%    Weight:      Height:        Intake/Output Summary (Last 24 hours) at 07/13/2018 1005 Last data filed at 07/13/2018 8676 Gross per 24 hour  Intake 629.47 ml  Output 3100 ml  Net -2470.53 ml   Weight change:  Exam:   General:  Pt is alert, follows commands appropriately, not in acute distress  HEENT: No icterus, No thrush, No neck mass, [place]/AT  Cardiovascular: RRR, S1/S2, no rubs, no gallops  Respiratory: CTA bilaterally, no wheezing, no crackles, no rhonchi  Abdomen: Soft/+BS, non tender, non distended, no guarding  Extremities: trace LE edema, No lymphangitis, No petechiae, No rashes, no synovitis   Data Reviewed: I have personally reviewed following labs and imaging studies Basic Metabolic Panel: Recent Labs  Lab 07/12/18 1004 07/12/18 1357 07/12/18 1550 07/13/18 0433  NA 129* 132* 135 136  K 3.9 3.8 3.9 3.8  CL 91* 93* 97* 98  CO2 21* 23 24 27   GLUCOSE 578* 401* 310* 186*  BUN 23* 23* 22* 22*  CREATININE 1.14 1.05 0.97 0.95  CALCIUM 8.3* 8.5* 8.7* 8.1*  MG  --   --  2.3 2.3   Liver Function Tests: No results for input(s): AST, ALT, ALKPHOS, BILITOT, PROT, ALBUMIN in the last 168 hours. No results for input(s): LIPASE, AMYLASE in the last 168 hours. No results for input(s): AMMONIA in the last 168 hours. Coagulation Profile: No results for input(s): INR, PROTIME in the last 168 hours. CBC: Recent Labs  Lab 07/12/18 1004 07/13/18 0433  WBC 14.2* 15.4*  NEUTROABS 12.4*  --   HGB 13.2 13.4  HCT 40.9 43.0  MCV 82.5 85.0  PLT 273 277   Cardiac Enzymes: Recent Labs  Lab 07/12/18 1004 07/12/18 1550 07/12/18 2124  TROPONINI 0.18* 0.21* 0.22*   BNP: Invalid input(s): POCBNP CBG: Recent Labs  Lab 07/12/18 1623 07/12/18  1720 07/12/18 1822 07/12/18 2122 07/13/18 0726  GLUCAP 240* 238* 142* 213* 201*   HbA1C: No results for input(s): HGBA1C in the last 72 hours. Urine analysis:    Component Value Date/Time   COLORURINE YELLOW 07/12/2018 1519   APPEARANCEUR CLEAR 07/12/2018 1519   LABSPEC 1.020 07/12/2018 1519   PHURINE 6.0 07/12/2018 1519   GLUCOSEU >=500 (A) 07/12/2018 1519   HGBUR NEGATIVE 07/12/2018 1519   BILIRUBINUR NEGATIVE 07/12/2018 1519   KETONESUR NEGATIVE 07/12/2018 1519   PROTEINUR NEGATIVE 07/12/2018 1519   UROBILINOGEN 1.0 11/08/2013 1549   NITRITE NEGATIVE 07/12/2018 1519   LEUKOCYTESUR NEGATIVE 07/12/2018 1519   Sepsis Labs: @LABRCNTIP (procalcitonin:4,lacticidven:4) ) Recent Results (from the past 240 hour(s))  SARS Coronavirus 2 (CEPHEID - Performed in [place] hospital lab), Hosp Order     Status: None   Collection Time: 07/12/18  2:01 PM  Result Value Ref Range Status   SARS Coronavirus 2 NEGATIVE NEGATIVE Final    Comment: (NOTE) If result is NEGATIVE SARS-CoV-2 target nucleic acids are NOT DETECTED. The SARS-CoV-2 RNA is generally detectable in upper and lower  respiratory specimens during the acute phase of infection. The lowest  concentration of SARS-CoV-2 viral copies this assay can detect is 250  copies /  mL. A negative result does not preclude SARS-CoV-2 infection  and should not be used as the sole basis for treatment or other  patient management decisions.  A negative result may occur with  improper specimen collection / handling, submission of specimen other  than nasopharyngeal swab, presence of viral mutation(s) within the  areas targeted by this assay, and inadequate number of viral copies  (<250 copies / mL). A negative result must be combined with clinical  observations, patient history, and epidemiological information. If result is POSITIVE SARS-CoV-2 target nucleic acids are DETECTED. The SARS-CoV-2 RNA is generally detectable in upper and lower   respiratory specimens dur ing the acute phase of infection.  Positive  results are indicative of active infection with SARS-CoV-2.  Clinical  correlation with patient history and other diagnostic information is  necessary to determine patient infection status.  Positive results do  not rule out bacterial infection or co-infection with other viruses. If result is PRESUMPTIVE POSTIVE SARS-CoV-2 nucleic acids MAY BE PRESENT.   A presumptive positive result was obtained on the submitted specimen  and confirmed on repeat testing.  While 2019 novel coronavirus  (SARS-CoV-2) nucleic acids may be present in the submitted sample  additional confirmatory testing may be necessary for epidemiological  and / or clinical management purposes  to differentiate between  SARS-CoV-2 and other Sarbecovirus currently known to infect humans.  If clinically indicated additional testing with an alternate test  methodology [phone]) is advised. The SARS-CoV-2 RNA is generally  detectable in upper and lower respiratory sp ecimens during the acute  phase of infection. The expected result is Negative. Fact Sheet for Patients:  StrictlyIdeas.no Fact Sheet for Healthcare Providers: BankingDealers.co.za This test is not yet approved or cleared by the [place] FDA and has been authorized for detection and/or diagnosis of SARS-CoV-2 by FDA under an Emergency Use Authorization (EUA).  This EUA will remain in effect (meaning this test can be used) for the duration of the COVID-19 declaration under Section 564(b)(1) of the Act, 21 U.S.C. section 360bbb-3(b)(1), unless the authorization is terminated or revoked sooner. Performed at [facility], [address], [place], [place] [address]   MRSA PCR Screening     Status: None   Collection Time: 07/12/18  3:38 PM  Result Value Ref Range Status   MRSA by PCR NEGATIVE NEGATIVE Final    Comment:        The GeneXpert MRSA  Assay (FDA approved for NASAL specimens only), is one component of a comprehensive MRSA colonization surveillance program. It is not intended to diagnose MRSA infection nor to guide or monitor treatment for MRSA infections. Performed at [facility], [address] [place], [place] [address]      Scheduled Meds: . aspirin  81 mg Oral Daily  . carvedilol  6.25 mg Oral BID  . citalopram  20 mg Oral Daily  . enoxaparin (LOVENOX) injection  50 mg Subcutaneous Q24H  . furosemide  40 mg Intravenous BID  . gabapentin  900 mg Oral TID  . insulin aspart  0-20 Units Subcutaneous TID WC  . insulin aspart  0-5 Units Subcutaneous QHS  . insulin glargine  25 Units Subcutaneous QHS  . pantoprazole  40 mg Oral Daily  . pravastatin  20 mg Oral q1800  . rOPINIRole  0.5-1 mg Oral QHS   Continuous Infusions:  Procedures/Studies: Dg Chest Portable 1 View  Result Date: 07/12/2018 CLINICAL DATA:  Shortness of breath. EXAM: PORTABLE CHEST 1 VIEW COMPARISON:  07/11/2018  FINDINGS: Sequelae of CABG are again identified. The cardiac silhouette is mildly enlarged. Mild central pulmonary vascular congestion is again noted without overt edema. No airspace consolidation, sizeable pleural effusion, or pneumothorax is identified. No acute osseous abnormality is seen. IMPRESSION: Cardiomegaly and mild pulmonary vascular congestion. Electronically Signed   By: Logan Bores M.D.   On: 07/12/2018 10:19    Orson Eva, DO  [facility] Pager [phone]  If 7PM-7AM, please contact night-coverage [email or web address] Password [email or web address] 07/13/2018, 10:05 AM   LOS: 1 day

## 2018-07-13 NOTE — Progress Notes (Signed)
Patient had a 10 run of V-tach noted on telemetry. Patient asymptomatic with no complaints of chest pain, discomfort or shortness of breath. Dr. Harl Bowie made aware.

## 2018-07-13 NOTE — Consult Note (Signed)
Cardiology Consultation:   Patient ID: Shane Frye MRN: [id number]; DOB: 1965-02-24  Admit date: 07/12/2018 Date of Consult: 07/13/2018  Primary Care Provider: Terald Sleeper, PA-C Primary Cardiologist: Dr Shelva Majestic Primary Electrophysiologist:  n/a   Patient Profile:   Shane Frye is a 53 y.o. male with a hx of CAD and cocaine abuse who is being seen today for the evaluation of dyspnea at the request of Dr Tat.   53 yo male history of CAD with cath 2015 with severe 3 vessel CAD s/p CABG 10/2013, DM2, cocaine abuse, severe OSA, DM2, hyperlipidemia, HTN, admitted with dyspnea. Managed by primary team for hyperglycemia and hyperosmola nonketotic state, cardiology consulted for evidence of volume overload and acute drop in LVEF by echo this admission.     History of Present Illness:   Shane Frye is a 53 yo male history of CAD with cath 2015 with severe 3 vessel CAD s/p CABG 10/2013, DM2, cocaine abuse, severe OSA, DM2, hyperlipidemia, HTN, admitted with dyspnea. Managed by primary team for hyperglycemia and hyperosmola nonketotic state, cardiology consulted for evidence of volume overload and acute drop in LVEF by echo this admission.  He reports 2 prior admissions to [facility] within the last 2 months for similar symptoms, he is unclear what work was done. He reports several weeks of SOB/DOE/chest tightness. Reports significnat LE edema, weight increased about 15 lbs at home. Exertional chest tightness 8/10 with associated SOB. +orthopnea.     K 3.9 Cr 1.14 BUN 23 WBC 14.2 Plt 273 BNP 1082 CXR cardiomegaly, mild edema UDS + cocaine Trop 0.18-->0.21-->0.22 EKG SR, chronic lateral ST/T changes COVID-19: neg 11/2014 echo (per clinic note, cannot find study report): an ejection fraction of 55-60%.  He had normal wall motion 06/2018 echo: LVEF 25-30%, restrictive diastolic function, severe LAE, mild RAE, dilated IVC  Past Medical History:  Diagnosis Date  . Anxiety   .  COPD (chronic obstructive pulmonary disease) ([place])   . Depression   . Diabetes mellitus   . GERD (gastroesophageal reflux disease)   . High cholesterol   . Hypertension   . Insomnia   . Myocardial infarction ([place]) 11/04/13  . Neuropathy     Past Surgical History:  Procedure Laterality Date  . CARDIOVERSION N/A 11/06/2013   Procedure: CARDIOVERSION;  Surgeon: Thayer Headings, MD;  Location: [facility];  Service: Cardiovascular;  Laterality: N/A;  . CARPAL TUNNEL RELEASE  04/2015  . COLONOSCOPY N/A 05/11/2014   Procedure: COLONOSCOPY;  Surgeon: Rogene Houston, MD;  [facility];  Service: Endoscopy;  Laterality: N/A;  930  . CORONARY ARTERY BYPASS GRAFT N/A 11/09/2013   Procedure: CORONARY ARTERY BYPASS GRAFTING (CABG);  Surgeon: Melrose Nakayama, MD;  Location: [facility];  Service: Open Heart Surgery;  Laterality: N/A;  . INTRAOPERATIVE TRANSESOPHAGEAL ECHOCARDIOGRAM N/A 11/09/2013   Procedure: INTRAOPERATIVE TRANSESOPHAGEAL ECHOCARDIOGRAM;  Surgeon: Melrose Nakayama, MD;  Location: [facility];  Service: Open Heart Surgery;  Laterality: N/A;  . LEFT HEART CATHETERIZATION WITH CORONARY ANGIOGRAM N/A 11/04/2013   Procedure: LEFT HEART CATHETERIZATION WITH CORONARY ANGIOGRAM;  Surgeon: Troy Sine, MD;  [facility];  Service: Cardiovascular;  Laterality: N/A;  . NO PAST SURGERIES       Inpatient Medications: Scheduled Meds: . aspirin  81 mg Oral Daily  . carvedilol  6.25 mg Oral BID  . citalopram  20 mg Oral Daily  . enoxaparin (LOVENOX) injection  50 mg Subcutaneous Q24H  . furosemide  40  mg Oral Daily  . insulin aspart  0-20 Units Subcutaneous TID WC  . insulin aspart  0-5 Units Subcutaneous QHS  . insulin aspart  3 Units Subcutaneous TID WC  . insulin glargine  30 Units Subcutaneous QHS  . living well with diabetes book   Does not apply Once  . pantoprazole  40 mg Oral Daily  . pravastatin  20 mg Oral q1800  . rOPINIRole  0.5-1 mg Oral QHS   Continuous  Infusions:  PRN Meds: acetaminophen, albuterol, ondansetron (ZOFRAN) IV, oxyCODONE-acetaminophen **AND** oxyCODONE  Allergies:   No Known Allergies  Social History:   Social History   Socioeconomic History  . Marital status: Married    Spouse name: Inez Catalina   . Number of children: 4  . Years of education: 41  . Highest education level: Not on file  Occupational History  . Not on file  Social Needs  . Financial resource strain: Not on file  . Food insecurity:    Worry: Not on file    Inability: Not on file  . Transportation needs:    Medical: Not on file    Non-medical: Not on file  Tobacco Use  . Smoking status: Current Some Day Smoker    Packs/day: 0.50    Types: Cigarettes    Last attempt to quit: 11/10/2013    Years since quitting: 4.6  . Smokeless tobacco: Former Systems developer    Quit date: 11/04/2013  Substance and Sexual Activity  . Alcohol use: No    Alcohol/week: 2.0 standard drinks    Types: 2 Cans of beer per week  . Drug use: No    Comment: cocaine over 3 years ago  . Sexual activity: Yes  Lifestyle  . Physical activity:    Days per week: Not on file    Minutes per session: Not on file  . Stress: Not on file  Relationships  . Social connections:    Talks on phone: Not on file    Gets together: Not on file    Attends religious service: Not on file    Active member of club or organization: Not on file    Attends meetings of clubs or organizations: Not on file    Relationship status: Not on file  . Intimate partner violence:    Fear of current or ex partner: Not on file    Emotionally abused: Not on file    Physically abused: Not on file    Forced sexual activity: Not on file  Other Topics Concern  . Not on file  Social History Narrative   Lives with wife and 2 children   Caffeine use: none    Family History:    Family History  Problem Relation Age of Onset  . Hypertension Paternal Grandfather   . Hypertension Brother   . Diabetes Brother   .  Neuropathy Neg Hx      ROS:  Please see the history of present illness.  All other ROS reviewed and negative.     Physical Exam/Data:   Vitals:   07/13/18 1000 07/13/18 1001 07/13/18 1100 07/13/18 1200  BP: 116/81  (!) 121/97 123/78  Pulse: 76  80 85  Resp: (!) 26 16 13  (!) 26  Temp:      TempSrc:      SpO2: 95%  99% 100%  Weight:      Height:        Intake/Output Summary (Last 24 hours) at 07/13/2018 1236 Last data filed at 07/13/2018  1048 Gross per 24 hour  Intake 629.47 ml  Output 3925 ml  Net -3295.53 ml   Last 3 Weights 07/13/2018 07/12/2018 07/12/2018  Weight (lbs) 222 lb 0.1 oz 225 lb 15.5 oz 235 lb  Weight (kg) 100.7 kg 102.5 kg 106.595 kg     Body mass index is 32.78 kg/m.  General:  Well nourished, well developed, in no acute distress HEENT: normal Lymph: no adenopathy Neck: no JVD Endocrine:  No thryomegaly Cardiac:  normal S1, S2; RRR; no murmur  Lungs:  clear to auscultation bilaterally, no wheezing, rhonchi or rales  Abd: soft, nontender, no hepatomegaly  Ext: no edema Musculoskeletal:  No deformities, BUE and BLE strength normal and equal Skin: warm and dry  Neuro:  CNs 2-12 intact, no focal abnormalities noted Psych:  Normal affect    Laboratory Data:  Chemistry Recent Labs  Lab 07/12/18 1357 07/12/18 1550 07/13/18 0433  NA 132* 135 136  K 3.8 3.9 3.8  CL 93* 97* 98  CO2 23 24 27   GLUCOSE 401* 310* 186*  BUN 23* 22* 22*  CREATININE 1.05 0.97 0.95  CALCIUM 8.5* 8.7* 8.1*  GFRNONAA >60 >60 >60  GFRAA >60 >60 >60  ANIONGAP 16* 14 11    No results for input(s): PROT, ALBUMIN, AST, ALT, ALKPHOS, BILITOT in the last 168 hours. Hematology Recent Labs  Lab 07/12/18 1004 07/13/18 0433  WBC 14.2* 15.4*  RBC 4.96 5.06  HGB 13.2 13.4  HCT 40.9 43.0  MCV 82.5 85.0  MCH 26.6 26.5  MCHC 32.3 31.2  RDW 13.3 13.4  PLT 273 277   Cardiac Enzymes Recent Labs  Lab 07/12/18 1004 07/12/18 1550 07/12/18 2124  TROPONINI 0.18* 0.21* 0.22*    No results for input(s): TROPIPOC in the last 168 hours.  BNP Recent Labs  Lab 07/12/18 1004  BNP 1,082.0*    DDimer No results for input(s): DDIMER in the last 168 hours.  Radiology/Studies:  Dg Chest Portable 1 View  Result Date: 07/12/2018 CLINICAL DATA:  Shortness of breath. EXAM: PORTABLE CHEST 1 VIEW COMPARISON:  07/11/2018 FINDINGS: Sequelae of CABG are again identified. The cardiac silhouette is mildly enlarged. Mild central pulmonary vascular congestion is again noted without overt edema. No airspace consolidation, sizeable pleural effusion, or pneumothorax is identified. No acute osseous abnormality is seen. IMPRESSION: Cardiomegaly and mild pulmonary vascular congestion. Electronically Signed   By: Logan Bores M.D.   On: 07/12/2018 10:19    Assessment and Plan:   1. Acute systolic heart failure - presents with signs and symptoms of acute systolic heart failure - presented volume overloaded, did get gentle IVFs in setting of HONK. Now on Lasix IV 40mg  bid.  - was cocaine + on admission. He tells me he smoked a marijuana pipe that he later found out his friend laced with cocaine, he tells me no other use. - possible etiologies of cardiomyopathy include ischemic given his prior history of CAD, cocaine/substance induced. He also has not been compliant with his home meds  - acute systolic HF complicated by poor compliance as well as cocaine abuse. He does have history of CAD and exertional chest tightness, as well as 10 beat run of NSVT on telemetry  - I think with his LVEF drop, chest pain,we have to distinguish if he has high risk anatomy or not with a LHC/RHC - would recommend transfer to [facility] to medicine service, we will arrange cath tomorrow.   - beta blocker use controversial in this setting. He swears  to me this was "accidental use", certaintly great benefit with beta blocker with his CAD, LVEF drop and NSVT. Continue nonselective coreg. - start ACEI vs ARB after cath. Cost  is an issue, would not start entresto as of yet.  - appearas euvolemic, agree with oral lasix  2. CAD - history of prior CABG in 2015 - mildly elevated fairly flat troponin in setting of acute systolic HF. EKG chronic lateral ST/T changes - drop in LVEF as reported above - plan for [facility] tomorrow   3. NSVT - 10 beat asymptomatic run - K 3.8, Mg 2.3 - further cardiac workup as planned above. Also cocaine + - have elected to conitnue nonselective beta blocker   I have reviewed the risks, indications, and alternatives to cardiac catheterization, possible angioplasty, and stenting with the patient today. Risks include but are not limited to bleeding, infection, vascular injury, stroke, myocardial infection, arrhythmia, kidney injury, radiation-related injury in the case of prolonged fluoroscopy use, emergency cardiac surgery, and death. The patient understands the risks of serious complication is 1-2 in 7014 with diagnostic cardiac cath and 1-2% or less with angioplasty/stenting.    For questions or updates, please contact [place] Please consult [email or web address] for contact info under     Signed, Carlyle Dolly, MD  07/13/2018 12:36 PM

## 2018-07-13 NOTE — Progress Notes (Signed)
Pt received from [facility] via [facility]. VSS. CHG complete. Telemetry applied. Pt oriented to room and unit. Dinner tray ordered.  Pt son updated via phone on pt arrival to [facility] and new room number.  Clyde Canterbury, RN

## 2018-07-14 ENCOUNTER — Encounter (HOSPITAL_COMMUNITY)
Admission: EM | Disposition: A | Discharge status: Home / Self Care | Payer: Self-pay | Source: Home / Self Care | Attending: Internal Medicine

## 2018-07-14 ENCOUNTER — Encounter (HOSPITAL_COMMUNITY): Payer: Self-pay | Admitting: Cardiovascular Disease

## 2018-07-14 ENCOUNTER — Ambulatory Visit: Payer: Medicare HMO | Admitting: Physician Assistant

## 2018-07-14 DIAGNOSIS — I2510 Atherosclerotic heart disease of native coronary artery without angina pectoris: Secondary | ICD-10-CM

## 2018-07-14 DIAGNOSIS — I5043 Acute on chronic combined systolic (congestive) and diastolic (congestive) heart failure: Secondary | ICD-10-CM | POA: Diagnosis present

## 2018-07-14 DIAGNOSIS — F1410 Cocaine abuse, uncomplicated: Secondary | ICD-10-CM

## 2018-07-14 HISTORY — PX: RIGHT/LEFT HEART CATH AND CORONARY/GRAFT ANGIOGRAPHY: CATH118267

## 2018-07-14 LAB — POCT I-STAT 7, (LYTES, BLD GAS, ICA,H+H)
Acid-Base Excess: 2 mmol/L (ref 0.0–2.0)
Bicarbonate: 30.1 mmol/L — ABNORMAL HIGH (ref 20.0–28.0)
Calcium, Ion: 1.14 mmol/L — ABNORMAL LOW (ref 1.15–1.40)
HCT: 44 % (ref 39.0–52.0)
Hemoglobin: 15 g/dL (ref 13.0–17.0)
O2 Saturation: 98 %
Potassium: 4 mmol/L (ref 3.5–5.1)
Sodium: 135 mmol/L (ref 135–145)
TCO2: 32 mmol/L (ref 22–32)
pCO2 arterial: 57.5 mmHg — ABNORMAL HIGH (ref 32.0–48.0)
pH, Arterial: 7.327 — ABNORMAL LOW (ref 7.350–7.450)
pO2, Arterial: 124 mmHg — ABNORMAL HIGH (ref 83.0–108.0)

## 2018-07-14 LAB — POCT I-STAT EG7
Acid-Base Excess: 4 mmol/L — ABNORMAL HIGH (ref 0.0–2.0)
Bicarbonate: 32.3 mmol/L — ABNORMAL HIGH (ref 20.0–28.0)
Calcium, Ion: 1.15 mmol/L (ref 1.15–1.40)
HCT: 44 % (ref 39.0–52.0)
Hemoglobin: 15 g/dL (ref 13.0–17.0)
O2 Saturation: 71 %
Potassium: 4 mmol/L (ref 3.5–5.1)
Sodium: 136 mmol/L (ref 135–145)
TCO2: 34 mmol/L — ABNORMAL HIGH (ref 22–32)
pCO2, Ven: 64.7 mmHg — ABNORMAL HIGH (ref 44.0–60.0)
pH, Ven: 7.307 (ref 7.250–7.430)
pO2, Ven: 42 mmHg (ref 32.0–45.0)

## 2018-07-14 LAB — URINE CULTURE: Culture: NO GROWTH

## 2018-07-14 LAB — HEMOGLOBIN A1C
Hgb A1c MFr Bld: 9.5 % — ABNORMAL HIGH (ref 4.8–5.6)
Mean Plasma Glucose: 226 mg/dL

## 2018-07-14 LAB — HIV ANTIBODY (ROUTINE TESTING W REFLEX): HIV Screen 4th Generation wRfx: NONREACTIVE

## 2018-07-14 LAB — GLUCOSE, CAPILLARY
Glucose-Capillary: 148 mg/dL — ABNORMAL HIGH (ref 70–99)
Glucose-Capillary: 145 mg/dL — ABNORMAL HIGH (ref 70–99)
Glucose-Capillary: 168 mg/dL — ABNORMAL HIGH (ref 70–99)
Glucose-Capillary: 171 mg/dL — ABNORMAL HIGH (ref 70–99)

## 2018-07-14 SURGERY — RIGHT/LEFT HEART CATH AND CORONARY/GRAFT ANGIOGRAPHY
Anesthesia: LOCAL

## 2018-07-14 MED ORDER — HEPARIN (PORCINE) IN NACL 1000-0.9 UT/500ML-% IV SOLN
INTRAVENOUS | Status: AC
  Filled 2018-07-14: qty 500

## 2018-07-14 MED ORDER — SODIUM CHLORIDE 0.9% FLUSH
3.0000 mL | Freq: Two times a day (BID) | INTRAVENOUS | Status: DC
  Administered 2018-07-14 – 2018-07-15 (×3): 3 mL via INTRAVENOUS

## 2018-07-14 MED ORDER — FENTANYL CITRATE (PF) 100 MCG/2ML IJ SOLN
INTRAMUSCULAR | Status: DC | PRN
  Administered 2018-07-14: 50 ug via INTRAVENOUS

## 2018-07-14 MED ORDER — LOSARTAN POTASSIUM 25 MG PO TABS
25.0000 mg | ORAL_TABLET | Freq: Every day | ORAL | Status: DC
  Administered 2018-07-14 – 2018-07-15 (×2): 25 mg via ORAL
  Filled 2018-07-14 (×2): qty 1

## 2018-07-14 MED ORDER — MIDAZOLAM HCL 2 MG/2ML IJ SOLN
INTRAMUSCULAR | Status: AC
  Filled 2018-07-14: qty 2

## 2018-07-14 MED ORDER — MIDAZOLAM HCL 2 MG/2ML IJ SOLN
INTRAMUSCULAR | Status: DC | PRN
  Administered 2018-07-14: 1 mg via INTRAVENOUS

## 2018-07-14 MED ORDER — SODIUM CHLORIDE 0.9 % IV SOLN: 250.0000 mL | INTRAVENOUS | Status: DC | PRN

## 2018-07-14 MED ORDER — FENTANYL CITRATE (PF) 100 MCG/2ML IJ SOLN
INTRAMUSCULAR | Status: AC
  Filled 2018-07-14: qty 2

## 2018-07-14 MED ORDER — SODIUM CHLORIDE 0.9% FLUSH: 3.0000 mL | INTRAVENOUS | Status: DC | PRN

## 2018-07-14 MED ORDER — VERAPAMIL HCL 2.5 MG/ML IV SOLN
INTRAVENOUS | Status: DC | PRN
  Administered 2018-07-14: 10 mL via INTRA_ARTERIAL

## 2018-07-14 MED ORDER — ATORVASTATIN CALCIUM 80 MG PO TABS
80.0000 mg | ORAL_TABLET | Freq: Every day | ORAL | Status: DC
  Administered 2018-07-14 – 2018-07-15 (×2): 80 mg via ORAL
  Filled 2018-07-14 (×2): qty 1

## 2018-07-14 MED ORDER — VERAPAMIL HCL 2.5 MG/ML IV SOLN
INTRAVENOUS | Status: AC
  Filled 2018-07-14: qty 2

## 2018-07-14 MED ORDER — FUROSEMIDE 10 MG/ML IJ SOLN
40.0000 mg | Freq: Two times a day (BID) | INTRAMUSCULAR | Status: DC
  Administered 2018-07-14 – 2018-07-16 (×4): 40 mg via INTRAVENOUS
  Filled 2018-07-14 (×4): qty 4

## 2018-07-14 MED ORDER — IOHEXOL 350 MG/ML SOLN
INTRAVENOUS | Status: DC | PRN
  Administered 2018-07-14: 90 mL via INTRA_ARTERIAL

## 2018-07-14 MED ORDER — HEPARIN (PORCINE) IN NACL 1000-0.9 UT/500ML-% IV SOLN
INTRAVENOUS | Status: DC | PRN
  Administered 2018-07-14 (×2): 500 mL

## 2018-07-14 MED ORDER — HEPARIN SODIUM (PORCINE) 1000 UNIT/ML IJ SOLN
INTRAMUSCULAR | Status: DC | PRN
  Administered 2018-07-14: 5000 [IU] via INTRAVENOUS

## 2018-07-14 MED ORDER — LIDOCAINE HCL (PF) 1 % IJ SOLN
INTRAMUSCULAR | Status: AC
  Filled 2018-07-14: qty 30

## 2018-07-14 MED ORDER — LIDOCAINE HCL (PF) 1 % IJ SOLN
INTRAMUSCULAR | Status: DC | PRN
  Administered 2018-07-14 (×2): 2 mL

## 2018-07-14 SURGICAL SUPPLY — 14 items
CATH BALLN WEDGE 5F 110CM (CATHETERS) ×2 IMPLANT
CATH INFINITI 5 FR IM (CATHETERS) ×2 IMPLANT
CATH INFINITI 5FR JL4 (CATHETERS) IMPLANT
CATH OPTITORQUE TIG 4.0 5F (CATHETERS) ×2 IMPLANT
DEVICE RAD COMP TR BAND LRG (VASCULAR PRODUCTS) ×2 IMPLANT
GLIDESHEATH SLEND SS 6F .021 (SHEATH) ×2 IMPLANT
GUIDEWIRE INQWIRE 1.5J.035X260 (WIRE) ×1 IMPLANT
HOVERMATT SINGLE USE (MISCELLANEOUS) ×2 IMPLANT
INQWIRE 1.5J .035X260CM (WIRE) ×2
KIT HEART LEFT (KITS) ×2 IMPLANT
PACK CARDIAC CATHETERIZATION (CUSTOM PROCEDURE TRAY) ×2 IMPLANT
SHEATH GLIDE SLENDER 4/5FR (SHEATH) ×2 IMPLANT
TRANSDUCER W/STOPCOCK (MISCELLANEOUS) ×2 IMPLANT
TUBING CIL FLEX 10 FLL-RA (TUBING) ×2 IMPLANT

## 2018-07-14 NOTE — Progress Notes (Addendum)
Progress Note    Shane Frye  QIH:[id number] DOB: 1965/11/26  DOA: 07/12/2018 PCP: Terald Sleeper, PA-C    Brief Narrative:   Chief complaint: Follow-up shortness of breath.  Medical records reviewed and are as summarized below:  Shane Frye is an 53 y.o. male with a PMH of CAD status post CABG in 2015, COPD, ongoing tobacco abuse, CHF, type 2 diabetes, medical nonadherence and hyperlipidemia who was admitted 07/11/2017 for evaluation of a 3-day history of shortness of breath associated with increasing lower extremity edema and increasing abdominal girth.  He apparently presented to an outside hospital on 07/11/2018 where he was given a dose of IV furosemide and subsequently left AMA.  On admission, he was noted to have a serum glucose of 578, creatinine 1.4, anion gap 17, WBC 14.2 and a chest x-ray that showed pulmonary vascular congestion.  Assessment/Plan:   Principal Problem:   Acute systolic and diastolic CHF (congestive heart failure) (HCC)/elevated troponin/coronary artery disease status post CABG x6/ nonsustained ventricular tachycardia Admission chest x-ray personally reviewed and showed cardiomegaly and mild edema.  Troponins elevated 0.18, 0.21, 0.22.  Placed on furosemide.  12 lead EKG personally reviewed and shows nonspecific ST wave changes.  2D echo shows an EF of 87-56% with diastolic dysfunction, with a change in his EF from prior echocardiogram (55-60%).  Telemetry showed a 10 beat run of nonsustained ventricular tachycardia.  Cardiology consulted with plans to pursue cardiac catheterization.  Placed on Coreg with plans to add ACE-I or ARB after cath.  Continue aspirin.  Active Problems:   HLD (hyperlipidemia) Continue Pravachol.    Cocaine abuse ([place]) UDS shows cocaine use.    H/O noncompliance with medical treatment, presenting hazards to health Likely complicated by drug use.    PAF (paroxysmal atrial fibrillation) (HCC) Currently in normal sinus rhythm,  not on anticoagulation given history of noncompliance.    Hypertension Continue carvedilol.  Lisinopril currently on hold.    Type 2 diabetes mellitus with other circulatory complications (HCC)   Hyperosmolar non-ketotic state in patient with type 2 diabetes mellitus ([place]) Managed with IV insulin and judicious fluid resuscitation.  Metformin held.  Was able to be transitioned to subcutaneous insulin once anion gap closed and his diet was advanced at that time.  Hemoglobin A1c of 9.5% indicates poor outpatient controlled, likely due to underlying nonadherence to treatment.  Seen by the diabetes coordinator 07/13/2018 with recommendations to add 5 units of meal coverage.  Managed with 30 units of Lantus, insulin resistant SSI and 3 units of meal coverage.  Since currently n.p.o., will not change regimen at present.    Chronic pain syndrome Currently being managed with home dose of Percocet, gabapentin and Celexa. Body mass index is 32.93 kg/m.   Family Communication/Anticipated D/C date and plan/Code Status   DVT prophylaxis: Lovenox ordered. Code Status: Full Code.  Family Communication: Declines my offer to call. Disposition Plan: Remains in inpatient status, discharge disposition pending cardiac catheter results.   Medical Consultants:    Cardiology   Anti-Infectives:    None  Subjective:   The patient is sleepy this morning.  He complains of nausea but no vomiting.  Denies current chest pain or shortness of breath.  Falls asleep when I attempt to interact with him.  Objective:    Vitals:   07/13/18 1934 07/14/18 0040 07/14/18 0329 07/14/18 0742  BP: (!) 126/96 120/85 133/87 (!) 137/94  Pulse: 82 82  83  Resp: 17 18  17 10  Temp: 97.6 F (36.4 C) 97.9 F (36.6 C) 97.6 F (36.4 C) 97.9 F (36.6 C)  TempSrc: Oral Oral Oral Oral  SpO2: 96% 93% 99% 98%  Weight:   101.2 kg   Height:        Intake/Output Summary (Last 24 hours) at 07/14/2018 0830 Last data filed at  07/13/2018 1230 Gross per 24 hour  Intake -  Output 1375 ml  Net -1375 ml   Filed Weights   07/12/18 1540 07/13/18 0500 07/14/18 0329  Weight: 102.5 kg 100.7 kg 101.2 kg    Exam: General: No acute distress.  Sleepy. Cardiovascular: Heart sounds show a regular rate, and rhythm. No gallops or rubs. No murmurs. No JVD. Lungs: Crackles bilaterally, worse on the left. Abdomen: Soft, nontender, nondistended with normal active bowel sounds. No masses. No hepatosplenomegaly. Neurological: Lethargic.  Grossly nonfocal. Skin: Warm and dry. No rashes or lesions. Extremities: No clubbing or cyanosis.  Trace edema. Pedal pulses 2+. Psychiatric: Mood and affect are flat. Insight and judgment are impaired.   Data Reviewed:   I have personally reviewed following labs and imaging studies:  Labs: Labs show the following:   Basic Metabolic Panel: Recent Labs  Lab 07/12/18 1004 07/12/18 1357 07/12/18 1550 07/13/18 0433  NA 129* 132* 135 136  K 3.9 3.8 3.9 3.8  CL 91* 93* 97* 98  CO2 21* 23 24 27   GLUCOSE 578* 401* 310* 186*  BUN 23* 23* 22* 22*  CREATININE 1.14 1.05 0.97 0.95  CALCIUM 8.3* 8.5* 8.7* 8.1*  MG  --   --  2.3 2.3   GFR Estimated Creatinine Clearance: 103 mL/min (by C-G formula based on SCr of 0.95 mg/dL).  CBC: Recent Labs  Lab 07/12/18 1004 07/13/18 0433  WBC 14.2* 15.4*  NEUTROABS 12.4*  --   HGB 13.2 13.4  HCT 40.9 43.0  MCV 82.5 85.0  PLT 273 277   Cardiac Enzymes: Recent Labs  Lab 07/12/18 1004 07/12/18 1550 07/12/18 2124  TROPONINI 0.18* 0.21* 0.22*   CBG: Recent Labs  Lab 07/13/18 1130 07/13/18 1556 07/13/18 1820 07/13/18 2113 07/14/18 0617  GLUCAP 146* 217* 194* 175* 148*   Hgb A1c: Recent Labs    07/12/18 1550  HGBA1C 9.5*   Microbiology Recent Results (from the past 240 hour(s))  SARS Coronavirus 2 (CEPHEID - Performed in [place] hospital lab), Hosp Order     Status: None   Collection Time: 07/12/18  2:01 PM  Result Value  Ref Range Status   SARS Coronavirus 2 NEGATIVE NEGATIVE Final    Comment: (NOTE) If result is NEGATIVE SARS-CoV-2 target nucleic acids are NOT DETECTED. The SARS-CoV-2 RNA is generally detectable in upper and lower  respiratory specimens during the acute phase of infection. The lowest  concentration of SARS-CoV-2 viral copies this assay can detect is 250  copies / mL. A negative result does not preclude SARS-CoV-2 infection  and should not be used as the sole basis for treatment or other  patient management decisions.  A negative result may occur with  improper specimen collection / handling, submission of specimen other  than nasopharyngeal swab, presence of viral mutation(s) within the  areas targeted by this assay, and inadequate number of viral copies  (<250 copies / mL). A negative result must be combined with clinical  observations, patient history, and epidemiological information. If result is POSITIVE SARS-CoV-2 target nucleic acids are DETECTED. The SARS-CoV-2 RNA is generally detectable in upper and lower  respiratory specimens  dur ing the acute phase of infection.  Positive  results are indicative of active infection with SARS-CoV-2.  Clinical  correlation with patient history and other diagnostic information is  necessary to determine patient infection status.  Positive results do  not rule out bacterial infection or co-infection with other viruses. If result is PRESUMPTIVE POSTIVE SARS-CoV-2 nucleic acids MAY BE PRESENT.   A presumptive positive result was obtained on the submitted specimen  and confirmed on repeat testing.  While 2019 novel coronavirus  (SARS-CoV-2) nucleic acids may be present in the submitted sample  additional confirmatory testing may be necessary for epidemiological  and / or clinical management purposes  to differentiate between  SARS-CoV-2 and other Sarbecovirus currently known to infect humans.  If clinically indicated additional testing with an  alternate test  methodology [phone]) is advised. The SARS-CoV-2 RNA is generally  detectable in upper and lower respiratory sp ecimens during the acute  phase of infection. The expected result is Negative. Fact Sheet for Patients:  StrictlyIdeas.no Fact Sheet for Healthcare Providers: BankingDealers.co.za This test is not yet approved or cleared by the [place] FDA and has been authorized for detection and/or diagnosis of SARS-CoV-2 by FDA under an Emergency Use Authorization (EUA).  This EUA will remain in effect (meaning this test can be used) for the duration of the COVID-19 declaration under Section 564(b)(1) of the Act, 21 U.S.C. section 360bbb-3(b)(1), unless the authorization is terminated or revoked sooner. Performed at [facility], [address] [place], [place] [address]   Culture, Urine     Status: None   Collection Time: 07/12/18  3:19 PM  Result Value Ref Range Status   Specimen Description   Final    URINE, RANDOM Performed at [facility], [address], [place], [place] [address]    Special Requests   Final    NONE Performed at [facility], [address], [place], [place] [address]    Culture   Final    NO GROWTH Performed at [facility], [place] [address], [place], [place] [address]    Report Status 07/14/2018 FINAL  Final  MRSA PCR Screening     Status: None   Collection Time: 07/12/18  3:38 PM  Result Value Ref Range Status   MRSA by PCR NEGATIVE NEGATIVE Final    Comment:        The GeneXpert MRSA Assay (FDA approved for NASAL specimens only), is one component of a comprehensive MRSA colonization surveillance program. It is not intended to diagnose MRSA infection nor to guide or monitor treatment for MRSA infections. Performed at [facility], [address], [place], [place] [address]     Procedures and diagnostic studies:  Dg Chest Portable 1 View  Result Date: 07/12/2018 CLINICAL  DATA:  Shortness of breath. EXAM: PORTABLE CHEST 1 VIEW COMPARISON:  07/11/2018 FINDINGS: Sequelae of CABG are again identified. The cardiac silhouette is mildly enlarged. Mild central pulmonary vascular congestion is again noted without overt edema. No airspace consolidation, sizeable pleural effusion, or pneumothorax is identified. No acute osseous abnormality is seen. IMPRESSION: Cardiomegaly and mild pulmonary vascular congestion. Electronically Signed   By: Logan Bores M.D.   On: 07/12/2018 10:19    Medications:   . aspirin  81 mg Oral Daily  . carvedilol  6.25 mg Oral BID  . citalopram  20 mg Oral Daily  . enoxaparin (LOVENOX) injection  50 mg Subcutaneous Q24H  . furosemide  40 mg Oral Daily  . insulin  aspart  0-20 Units Subcutaneous TID WC  . insulin aspart  0-5 Units Subcutaneous QHS  . insulin aspart  3 Units Subcutaneous TID WC  . insulin glargine  30 Units Subcutaneous QHS  . pantoprazole  40 mg Oral Daily  . pravastatin  20 mg Oral q1800  . rOPINIRole  0.5-1 mg Oral QHS  . sodium chloride flush  3 mL Intravenous Q12H   Continuous Infusions: . sodium chloride    . sodium chloride Stopped (07/14/18 0723)     LOS: 2 days   Jacquelynn Cree  [facility] Pager [phone]. If unable to reach me by pager, please call my cell phone at [phone].  *Please refer to [email or web address], password [email or web address] to get updated schedule on who will round on this patient, as hospitalists switch teams weekly. If 7PM-7AM, please contact night-coverage at [email or web address], password [email or web address] for any overnight needs.  07/14/2018, 8:30 AM

## 2018-07-14 NOTE — Progress Notes (Signed)
Progress Note  Patient Name: Shane Frye Date of Encounter: 07/14/2018  Primary Cardiologist:  Branch/Kelly  Subjective   No chest pain dyspnea better   Inpatient Medications    Scheduled Meds: . aspirin  81 mg Oral Daily  . carvedilol  6.25 mg Oral BID  . citalopram  20 mg Oral Daily  . enoxaparin (LOVENOX) injection  50 mg Subcutaneous Q24H  . furosemide  40 mg Oral Daily  . insulin aspart  0-20 Units Subcutaneous TID WC  . insulin aspart  0-5 Units Subcutaneous QHS  . insulin aspart  3 Units Subcutaneous TID WC  . insulin glargine  30 Units Subcutaneous QHS  . pantoprazole  40 mg Oral Daily  . pravastatin  20 mg Oral q1800  . rOPINIRole  0.5-1 mg Oral QHS  . sodium chloride flush  3 mL Intravenous Q12H   Continuous Infusions: . sodium chloride    . sodium chloride Stopped (07/14/18 0723)   PRN Meds: sodium chloride, acetaminophen, albuterol, ondansetron (ZOFRAN) IV, oxyCODONE-acetaminophen **AND** oxyCODONE, sodium chloride flush   Vital Signs    Vitals:   07/13/18 1934 07/14/18 0040 07/14/18 0329 07/14/18 0742  BP: (!) 126/96 120/85 133/87 (!) 137/94  Pulse: 82 82  83  Resp: 17 18 17 10   Temp: 97.6 F (36.4 C) 97.9 F (36.6 C) 97.6 F (36.4 C) 97.9 F (36.6 C)  TempSrc: Oral Oral Oral Oral  SpO2: 96% 93% 99% 98%  Weight:   101.2 kg   Height:        Intake/Output Summary (Last 24 hours) at 07/14/2018 0815 Last data filed at 07/13/2018 1230 Gross per 24 hour  Intake -  Output 1375 ml  Net -1375 ml   Last 3 Weights 07/14/2018 07/13/2018 07/12/2018  Weight (lbs) 223 lb 222 lb 0.1 oz 225 lb 15.5 oz  Weight (kg) 101.152 kg 100.7 kg 102.5 kg      Telemetry    NSR 07/14/2018  - Personally Reviewed  ECG    SR ICLBBB lateral T wave changes  - Personally Reviewed  Physical Exam  Overweight white male  GEN: No acute distress.   Neck: No JVD Cardiac: RRR, no murmurs, rubs, or gallops. Post sternotomy  Respiratory: Clear to auscultation bilaterally.  GI: Soft, nontender, non-distended  MS: No edema; No deformity. Neuro:  Nonfocal  Psych: Normal affect   Labs    Chemistry Recent Labs  Lab 07/12/18 1357 07/12/18 1550 07/13/18 0433  NA 132* 135 136  K 3.8 3.9 3.8  CL 93* 97* 98  CO2 23 24 27   GLUCOSE 401* 310* 186*  BUN 23* 22* 22*  CREATININE 1.05 0.97 0.95  CALCIUM 8.5* 8.7* 8.1*  GFRNONAA >60 >60 >60  GFRAA >60 >60 >60  ANIONGAP 16* 14 11     Hematology Recent Labs  Lab 07/12/18 1004 07/13/18 0433  WBC 14.2* 15.4*  RBC 4.96 5.06  HGB 13.2 13.4  HCT 40.9 43.0  MCV 82.5 85.0  MCH 26.6 26.5  MCHC 32.3 31.2  RDW 13.3 13.4  PLT 273 277    Cardiac Enzymes Recent Labs  Lab 07/12/18 1004 07/12/18 1550 07/12/18 2124  TROPONINI 0.18* 0.21* 0.22*   No results for input(s): TROPIPOC in the last 168 hours.   BNP Recent Labs  Lab 07/12/18 1004  BNP 1,082.0*     DDimer No results for input(s): DDIMER in the last 168 hours.   Radiology    Dg Chest Portable 1 View  Result Date: 07/12/2018  CLINICAL DATA:  Shortness of breath. EXAM: PORTABLE CHEST 1 VIEW COMPARISON:  07/11/2018 FINDINGS: Sequelae of CABG are again identified. The cardiac silhouette is mildly enlarged. Mild central pulmonary vascular congestion is again noted without overt edema. No airspace consolidation, sizeable pleural effusion, or pneumothorax is identified. No acute osseous abnormality is seen. IMPRESSION: Cardiomegaly and mild pulmonary vascular congestion. Electronically Signed   By: Logan Bores M.D.   On: 07/12/2018 10:19    Cardiac Studies   Echo EF 25-30%    Patient Profile     53 y.o. male history of CABG 2015 admitted with DKA hisotry of cocaine abouse and new onset systolic CHF with elevated troponin  Assessment & Plan    CAD/CABG:  New onset CHF / Dyspnea ? Anginal equivalent for cath today to assess graft patency  CHF:  EF 25-30% by echo BNP 1082 improved with daily lasix on ACE and coreg right heart cath today   DKA:   Per primary service  Substance Abuse:  Cocaine positive on admission        For questions or updates, please contact [place] Please consult [email or web address] for contact info under        Signed, Jenkins Rouge, MD  07/14/2018, 8:15 AM

## 2018-07-14 NOTE — TOC Benefit Eligibility Note (Signed)
Transition of Care [facility]) Benefit Eligibility Note    Patient Details  Name: Shane Frye MRN: [id number] Date of Birth: 10-25-1965   Medication/Dose: Delene Loll 49/51 MG BID  Covered?: Yes  Tier: 3 Drug  Prescription Coverage Preferred Pharmacy: YES([facility])  Spoke with Person/Company/Phone Number:: [employer] # [phone])  Co-Pay: $ 8.95  Prior Approval: No     Additional Notes: SECONDARY INS : MEDICAID OF [place], EFF-DATE 03-20-2017, CO-PAY- $3.90 FOR EACH PRESCRIPTION    Memory Argue Phone Number: 07/14/2018, 3:01 PM

## 2018-07-14 NOTE — H&P (View-Only) (Signed)
Progress Note  Patient Name: Shane Frye Date of Encounter: 07/14/2018  Primary Cardiologist:  Branch/Kelly  Subjective   No chest pain dyspnea better   Inpatient Medications    Scheduled Meds: . aspirin  81 mg Oral Daily  . carvedilol  6.25 mg Oral BID  . citalopram  20 mg Oral Daily  . enoxaparin (LOVENOX) injection  50 mg Subcutaneous Q24H  . furosemide  40 mg Oral Daily  . insulin aspart  0-20 Units Subcutaneous TID WC  . insulin aspart  0-5 Units Subcutaneous QHS  . insulin aspart  3 Units Subcutaneous TID WC  . insulin glargine  30 Units Subcutaneous QHS  . pantoprazole  40 mg Oral Daily  . pravastatin  20 mg Oral q1800  . rOPINIRole  0.5-1 mg Oral QHS  . sodium chloride flush  3 mL Intravenous Q12H   Continuous Infusions: . sodium chloride    . sodium chloride Stopped (07/14/18 0723)   PRN Meds: sodium chloride, acetaminophen, albuterol, ondansetron (ZOFRAN) IV, oxyCODONE-acetaminophen **AND** oxyCODONE, sodium chloride flush   Vital Signs    Vitals:   07/13/18 1934 07/14/18 0040 07/14/18 0329 07/14/18 0742  BP: (!) 126/96 120/85 133/87 (!) 137/94  Pulse: 82 82  83  Resp: 17 18 17 10   Temp: 97.6 F (36.4 C) 97.9 F (36.6 C) 97.6 F (36.4 C) 97.9 F (36.6 C)  TempSrc: Oral Oral Oral Oral  SpO2: 96% 93% 99% 98%  Weight:   101.2 kg   Height:        Intake/Output Summary (Last 24 hours) at 07/14/2018 0815 Last data filed at 07/13/2018 1230 Gross per 24 hour  Intake -  Output 1375 ml  Net -1375 ml   Last 3 Weights 07/14/2018 07/13/2018 07/12/2018  Weight (lbs) 223 lb 222 lb 0.1 oz 225 lb 15.5 oz  Weight (kg) 101.152 kg 100.7 kg 102.5 kg      Telemetry    NSR 07/14/2018  - Personally Reviewed  ECG    SR ICLBBB lateral T wave changes  - Personally Reviewed  Physical Exam  Overweight white male  GEN: No acute distress.   Neck: No JVD Cardiac: RRR, no murmurs, rubs, or gallops. Post sternotomy  Respiratory: Clear to auscultation bilaterally.  GI: Soft, nontender, non-distended  MS: No edema; No deformity. Neuro:  Nonfocal  Psych: Normal affect   Labs    Chemistry Recent Labs  Lab 07/12/18 1357 07/12/18 1550 07/13/18 0433  NA 132* 135 136  K 3.8 3.9 3.8  CL 93* 97* 98  CO2 23 24 27   GLUCOSE 401* 310* 186*  BUN 23* 22* 22*  CREATININE 1.05 0.97 0.95  CALCIUM 8.5* 8.7* 8.1*  GFRNONAA >60 >60 >60  GFRAA >60 >60 >60  ANIONGAP 16* 14 11     Hematology Recent Labs  Lab 07/12/18 1004 07/13/18 0433  WBC 14.2* 15.4*  RBC 4.96 5.06  HGB 13.2 13.4  HCT 40.9 43.0  MCV 82.5 85.0  MCH 26.6 26.5  MCHC 32.3 31.2  RDW 13.3 13.4  PLT 273 277    Cardiac Enzymes Recent Labs  Lab 07/12/18 1004 07/12/18 1550 07/12/18 2124  TROPONINI 0.18* 0.21* 0.22*   No results for input(s): TROPIPOC in the last 168 hours.   BNP Recent Labs  Lab 07/12/18 1004  BNP 1,082.0*     DDimer No results for input(s): DDIMER in the last 168 hours.   Radiology    Dg Chest Portable 1 View  Result Date: 07/12/2018  CLINICAL DATA:  Shortness of breath. EXAM: PORTABLE CHEST 1 VIEW COMPARISON:  07/11/2018 FINDINGS: Sequelae of CABG are again identified. The cardiac silhouette is mildly enlarged. Mild central pulmonary vascular congestion is again noted without overt edema. No airspace consolidation, sizeable pleural effusion, or pneumothorax is identified. No acute osseous abnormality is seen. IMPRESSION: Cardiomegaly and mild pulmonary vascular congestion. Electronically Signed   By: Logan Bores M.D.   On: 07/12/2018 10:19    Cardiac Studies   Echo EF 25-30%    Patient Profile     53 y.o. male history of CABG 2015 admitted with DKA hisotry of cocaine abouse and new onset systolic CHF with elevated troponin  Assessment & Plan    CAD/CABG:  New onset CHF / Dyspnea ? Anginal equivalent for cath today to assess graft patency  CHF:  EF 25-30% by echo BNP 1082 improved with daily lasix on ACE and coreg right heart cath today   DKA:   Per primary service  Substance Abuse:  Cocaine positive on admission        For questions or updates, please contact [place] Please consult [email or web address] for contact info under        Signed, Jenkins Rouge, MD  07/14/2018, 8:15 AM

## 2018-07-14 NOTE — Interval H&P Note (Signed)
Cath Lab Visit (complete for each Cath Lab visit)  Clinical Evaluation Leading to the Procedure:   ACS: Yes.   unstable angina  Non-ACS:   N/a  CHF: NYHA class 4      History and Physical Interval Note:  07/14/2018 12:32 PM  Shane Frye  has presented today for surgery, with the diagnosis of cardiomyopathy.  The various methods of treatment have been discussed with the patient and family. After consideration of risks, benefits and other options for treatment, the patient has consented to  Procedure(s): RIGHT/LEFT HEART CATH AND CORONARY/GRAFT ANGIOGRAPHY (N/A) as a surgical intervention.  The patient's history has been reviewed, patient examined, no change in status, stable for surgery.  I have reviewed the patient's chart and labs.  Questions were answered to the patient's satisfaction.     [name]

## 2018-07-14 NOTE — Progress Notes (Signed)
Pt received from cath lab. VSS. TR band w/ 13cc air, no signs of bleeding with good perfusion to hand. Pt educated on weight bearing restriction of L arm. Will continue to monitor.  Clyde Canterbury, RN

## 2018-07-14 NOTE — Progress Notes (Signed)
Received call from family member telling this RN that she had spoken to the patient and the patient told her that he was having trouble breathing. This RN went in and asked the patient if he was having some trouble breathing, patient stated that, " little bit". Asked patient why he didn't call me. Patient stated that, everybody was calling him.  Patient was not in any acute distress. Oxygen sats were 94% RA. Lungs sounded clear with diminished in bases. Placed patient on 2L O2 for comfort. Patient stated that he was feeling better  Will continue to monitor

## 2018-07-15 DIAGNOSIS — I5043 Acute on chronic combined systolic (congestive) and diastolic (congestive) heart failure: Secondary | ICD-10-CM

## 2018-07-15 LAB — BASIC METABOLIC PANEL
Anion gap: 8 (ref 5–15)
BUN: 20 mg/dL (ref 6–20)
CO2: 29 mmol/L (ref 22–32)
Calcium: 8.2 mg/dL — ABNORMAL LOW (ref 8.9–10.3)
Chloride: 98 mmol/L (ref 98–111)
Creatinine, Ser: 1.02 mg/dL (ref 0.61–1.24)
GFR calc Af Amer: >60 mL/min (ref >60)
GFR calc non Af Amer: >60 mL/min (ref >60)
Glucose, Bld: 156 mg/dL — ABNORMAL HIGH (ref 70–99)
Potassium: 3.7 mmol/L (ref 3.5–5.1)
Sodium: 135 mmol/L (ref 135–145)

## 2018-07-15 LAB — GLUCOSE, CAPILLARY
Glucose-Capillary: 146 mg/dL — ABNORMAL HIGH (ref 70–99)
Glucose-Capillary: 182 mg/dL — ABNORMAL HIGH (ref 70–99)
Glucose-Capillary: 197 mg/dL — ABNORMAL HIGH (ref 70–99)
Glucose-Capillary: 215 mg/dL — ABNORMAL HIGH (ref 70–99)

## 2018-07-15 MED ORDER — LOSARTAN POTASSIUM 50 MG PO TABS
50.0000 mg | ORAL_TABLET | Freq: Every day | ORAL | Status: DC
  Administered 2018-07-16: 50 mg via ORAL
  Filled 2018-07-15: qty 1

## 2018-07-15 NOTE — Care Management Important Message (Signed)
Important Message  Patient Details  Name: Shane Frye MRN: [id number] Date of Birth: 1965/06/20   Medicare Important Message Given:  Yes    Orbie Pyo 07/15/2018, 3:23 PM

## 2018-07-15 NOTE — Clinical Social Work Note (Signed)
CSW acknowledges substance use consult. Will follow up with patient today.  Shane Frye, [place]

## 2018-07-15 NOTE — TOC Initial Note (Signed)
Transition of Care [facility]) - Initial/Assessment Note    Patient Details  Name: Shane Frye MRN: [id number] Date of Birth: [birth year]  Transition of Care [facility]) CM/SW Contact:    Candie Chroman, LCSW Phone Number: 07/15/2018, 2:20 PM  Clinical Narrative: CSW met with patient, introduced role, and inquired about interest in substance use treatment resources. Patient declined stating that it was a "one-time thing" for his friend's 30th birthday and he will "take care of it." Patient stated the marijuana joint he was smoking was laced with cocaine and he was unaware of it. No further concerns. CSW encouraged patient to contact CSW as needed. CSW signing off as social work intervention no longer needed.                Expected Discharge Plan: Home/Self Care Barriers to Discharge: Continued Medical Work up   Patient Goals and CMS Choice        Expected Discharge Plan and Services Expected Discharge Plan: Home/Self Care       Living arrangements for the past 2 months: Single Family Home                                      Prior Living Arrangements/Services Living arrangements for the past 2 months: Single Family Home Lives with:: Spouse Patient language and need for interpreter reviewed:: Yes(No needs.) Do you feel safe going back to the place where you live?: Yes      Need for Family Participation in Patient Care: No (Comment) Care giver support system in place?: Yes (comment)(Family)   Criminal Activity/Legal Involvement Pertinent to Current Situation/Hospitalization: No - Comment as needed  Activities of Daily Living Home Assistive Devices/Equipment: CBG Meter ADL Screening (condition at time of admission) Patient's cognitive ability adequate to safely complete daily activities?: Yes Is the patient deaf or have difficulty hearing?: No Does the patient have difficulty seeing, even when wearing glasses/contacts?: No Does the patient have difficulty concentrating,  remembering, or making decisions?: No Patient able to express need for assistance with ADLs?: Yes Does the patient have difficulty dressing or bathing?: No Independently performs ADLs?: Yes (appropriate for developmental age) Does the patient have difficulty walking or climbing stairs?: No Weakness of Legs: None Weakness of Arms/Hands: None  Permission Sought/Granted                  Emotional Assessment Appearance:: Appears stated age Attitude/Demeanor/Rapport: Engaged Affect (typically observed): Appropriate, Calm, Pleasant Orientation: : Oriented to Self, Oriented to Place, Oriented to  Time, Oriented to Situation Alcohol / Substance Use: Illicit Drugs, Tobacco Use Psych Involvement: No (comment)  Admission diagnosis:  sob Patient Active Problem List   Diagnosis Date Noted  . Acute on chronic combined systolic and diastolic CHF (congestive heart failure) ([place]) 07/14/2018  . Hyperosmolar non-ketotic state in patient with type 2 diabetes mellitus ([place]) 07/12/2018  . Pain management contract agreement 12/27/2015  . Lumbar disc disease with radiculopathy 11/27/2015  . OSA (obstructive sleep apnea) 07/21/2015  . Hypertension   . GERD (gastroesophageal reflux disease)   . Uncontrolled diabetes mellitus with circulatory complication ([place])   . Insomnia   . Neuropathy   . Rotator cuff tear 01/30/2014  . Dyspnea on exertion 12/26/2013  . Smoker 12/26/2013  . PAF (paroxysmal atrial fibrillation) ([place]) 12/26/2013  . 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 HLD (hyperlipidemia) 11/04/2013  . Obesity 11/04/2013  . Cocaine  abuse ([place]) 11/04/2013  . H/O noncompliance with medical treatment, presenting hazards to health 11/04/2013  . CAD (coronary artery disease), native coronary artery 11/04/2013   PCP:  Terald Sleeper, PA-C Pharmacy:   [facility], [place] [address] Phone: [phone] Fax: [phone]     Social Determinants of  Health (SDOH) Interventions    Readmission Risk Interventions No flowsheet data found.

## 2018-07-15 NOTE — Progress Notes (Addendum)
Progress Note    Shane Frye  XIP:[id number] DOB: Jul 03, 1965  DOA: 07/12/2018 PCP: Terald Sleeper, PA-C    Brief Narrative:   Chief complaint: Follow-up shortness of breath.  Medical records reviewed and are as summarized below:  Shane Frye is an 53 y.o. male with a PMH of CAD status post CABG in 2015, COPD, ongoing tobacco abuse, CHF, type 2 diabetes, medical nonadherence and hyperlipidemia who was admitted 07/11/2017 for evaluation of a 3-day history of shortness of breath associated with increasing lower extremity edema and increasing abdominal girth.  He apparently presented to an outside hospital on 07/11/2018 where he was given a dose of IV furosemide and subsequently left AMA.  On admission, he was noted to have a serum glucose of 578, creatinine 1.4, anion gap 17, WBC 14.2 and a chest x-ray that showed pulmonary vascular congestion.  Assessment/Plan:   Principal Problem:   Acute systolic and diastolic CHF (congestive heart failure) (HCC)/elevated troponin/coronary artery disease status post CABG x6/ nonsustained ventricular tachycardia/unstable angina Admission chest x-ray showed cardiomegaly and mild edema.  Troponins elevated 0.18, 0.21, 0.22.  Placed on furosemide.  12 lead EKG showed nonspecific ST wave changes.  2D echo showed an EF of 67-34% with diastolic dysfunction, with a change in his EF from prior echocardiogram (55-60%).  Telemetry showed a 10 beat run of nonsustained ventricular tachycardia.  Cardiology consulted and underwent cardiac catheterization 07/14/2018 with findings of severe three-vessel CAD with occluded vein grafts (the only patent graft out of 5 was the LIMA to the LAD).  Unfortunately, PCI not an option.  Aggressive medical therapy recommended.  Remained volume overloaded post cardiac catheterization with a wedge pressure of 29 mmHg requiring further IV diuresis.  I/O -2.1 L/24 hours. Continue Coreg, losartan, and Lasix (remains on IV).  Cardiology to  consider transitioning to [facility] with spironolactone.  Renal function stable post cardiac catheterization. PT evaluation requested.  Active Problems:   Tobacco abuse Tobacco cessation counseling requested.    HLD (hyperlipidemia) Continue Pravachol.    Cocaine abuse ([place]) UDS shows cocaine use.  Social worker consulted for substance abuse counseling and referral to [facility]. Counseled by myself as well.    H/O noncompliance with medical treatment, presenting hazards to health Likely complicated by drug use.    PAF (paroxysmal atrial fibrillation) (HCC) Currently in normal sinus rhythm, not on anticoagulation given history of noncompliance.    Hypertension Continue carvedilol.  Losartan added 07/14/2018.  Continue to diurese..    Type 2 diabetes mellitus with other circulatory complications (HCC)/   Hyperosmolar non-ketotic state in patient with type 2 diabetes mellitus ([place]) Managed with IV insulin and judicious fluid resuscitation on admission.  Metformin held.  Was able to be transitioned to subcutaneous insulin once anion gap closed and his diet was advanced at that time.  Hemoglobin A1c of 9.5% indicates poor outpatient controlled, likely due to underlying nonadherence to treatment.  Seen by the diabetes coordinator 07/13/2018 with recommendations to add 5 units of meal coverage.  Managed with 30 units of Lantus, insulin resistant SSI and 3 units of meal coverage.  CBGs currently well controlled 146-171.    Chronic pain syndrome Currently being managed with home dose of Percocet, gabapentin and Celexa. Body mass index is 32.64 kg/m.   Family Communication/Anticipated D/C date and plan/Code Status   DVT prophylaxis: Lovenox ordered. Code Status: Full Code.  Family Communication: Son updated by telephone. Disposition Plan: Remains in inpatient status, per cardiology notes, needs  further medical optimization before he can be safely discharged, with anticipated d/c date of 07/17/18.    Medical Consultants:    Cardiology   Anti-Infectives:    None  Subjective:   Still reports that his breathing is labored, not at baseline although he does report some improvement. No current chest pain or nausea though he feels "lousy" in general. Very concerned that we do not call his family and reveal his cocaine use, says "I got it under control".  Objective:    Vitals:   07/14/18 1415 07/14/18 1430 07/14/18 1935 07/15/18 0556  BP: (!) 122/92 (!) 131/96 115/90 109/83  Pulse:   80 76  Resp: 15 20 13 14   Temp:   97.8 F (36.6 C) 98.1 F (36.7 C)  TempSrc:   Oral Oral  SpO2: 97% 93% 97% 96%  Weight:    100.2 kg  Height:        Intake/Output Summary (Last 24 hours) at 07/15/2018 0813 Last data filed at 07/15/2018 5573 Gross per 24 hour  Intake 659.48 ml  Output 2850 ml  Net -2190.52 ml   Filed Weights   07/13/18 0500 07/14/18 0329 07/15/18 0556  Weight: 100.7 kg 101.2 kg 100.2 kg    Exam: General: No acute distress. Cardiovascular: Heart sounds show a regular rate, and rhythm. No gallops or rubs. No murmurs. No JVD. Lungs: Decreased breath sounds with a few rales.  Abdomen: Soft, nontender, nondistended with normal active bowel sounds. No masses. No hepatosplenomegaly. Skin: Warm and dry. No rashes or lesions. Extremities: No clubbing or cyanosis. Trace edema. Pedal pulses 2+.  Data Reviewed:   I have personally reviewed following labs and imaging studies:  Labs: Labs show the following:   Basic Metabolic Panel: Recent Labs  Lab 07/12/18 1004 07/12/18 1357 07/12/18 1550 07/13/18 0433 07/14/18 1301 07/14/18 1302 07/15/18 0304  NA 129* 132* 135 136 135 136 135  K 3.9 3.8 3.9 3.8 4.0 4.0 3.7  CL 91* 93* 97* 98  --   --  98  CO2 21* 23 24 27   --   --  29  GLUCOSE 578* 401* 310* 186*  --   --  156*  BUN 23* 23* 22* 22*  --   --  20  CREATININE 1.14 1.05 0.97 0.95  --   --  1.02  CALCIUM 8.3* 8.5* 8.7* 8.1*  --   --  8.2*  MG  --   --  2.3 2.3  --    --   --    GFR Estimated Creatinine Clearance: 95.5 mL/min (by C-G formula based on SCr of 1.02 mg/dL).  CBC: Recent Labs  Lab 07/12/18 1004 07/13/18 0433 07/14/18 1301 07/14/18 1302  WBC 14.2* 15.4*  --   --   NEUTROABS 12.4*  --   --   --   HGB 13.2 13.4 15.0 15.0  HCT 40.9 43.0 44.0 44.0  MCV 82.5 85.0  --   --   PLT 273 277  --   --    Cardiac Enzymes: Recent Labs  Lab 07/12/18 1004 07/12/18 1550 07/12/18 2124  TROPONINI 0.18* 0.21* 0.22*   CBG: Recent Labs  Lab 07/14/18 0617 07/14/18 1130 07/14/18 1630 07/14/18 2143 07/15/18 0600  GLUCAP 148* 145* 168* 171* 146*   Hgb A1c: Recent Labs    07/12/18 1550  HGBA1C 9.5*   Microbiology Recent Results (from the past 240 hour(s))  SARS Coronavirus 2 (CEPHEID - Performed in [place] hospital lab), [facility]  Status: None   Collection Time: 07/12/18  2:01 PM  Result Value Ref Range Status   SARS Coronavirus 2 NEGATIVE NEGATIVE Final    Comment: (NOTE) If result is NEGATIVE SARS-CoV-2 target nucleic acids are NOT DETECTED. The SARS-CoV-2 RNA is generally detectable in upper and lower  respiratory specimens during the acute phase of infection. The lowest  concentration of SARS-CoV-2 viral copies this assay can detect is 250  copies / mL. A negative result does not preclude SARS-CoV-2 infection  and should not be used as the sole basis for treatment or other  patient management decisions.  A negative result may occur with  improper specimen collection / handling, submission of specimen other  than nasopharyngeal swab, presence of viral mutation(s) within the  areas targeted by this assay, and inadequate number of viral copies  (<250 copies / mL). A negative result must be combined with clinical  observations, patient history, and epidemiological information. If result is POSITIVE SARS-CoV-2 target nucleic acids are DETECTED. The SARS-CoV-2 RNA is generally detectable in upper and lower  respiratory  specimens dur ing the acute phase of infection.  Positive  results are indicative of active infection with SARS-CoV-2.  Clinical  correlation with patient history and other diagnostic information is  necessary to determine patient infection status.  Positive results do  not rule out bacterial infection or co-infection with other viruses. If result is PRESUMPTIVE POSTIVE SARS-CoV-2 nucleic acids MAY BE PRESENT.   A presumptive positive result was obtained on the submitted specimen  and confirmed on repeat testing.  While 2019 novel coronavirus  (SARS-CoV-2) nucleic acids may be present in the submitted sample  additional confirmatory testing may be necessary for epidemiological  and / or clinical management purposes  to differentiate between  SARS-CoV-2 and other Sarbecovirus currently known to infect humans.  If clinically indicated additional testing with an alternate test  methodology [phone]) is advised. The SARS-CoV-2 RNA is generally  detectable in upper and lower respiratory sp ecimens during the acute  phase of infection. The expected result is Negative. Fact Sheet for Patients:  StrictlyIdeas.no Fact Sheet for Healthcare Providers: BankingDealers.co.za This test is not yet approved or cleared by the [place] FDA and has been authorized for detection and/or diagnosis of SARS-CoV-2 by FDA under an Emergency Use Authorization (EUA).  This EUA will remain in effect (meaning this test can be used) for the duration of the COVID-19 declaration under Section 564(b)(1) of the Act, 21 U.S.C. section 360bbb-3(b)(1), unless the authorization is terminated or revoked sooner. Performed at [facility], [address], [place], [place] [address]   Culture, Urine     Status: None   Collection Time: 07/12/18  3:19 PM  Result Value Ref Range Status   Specimen Description   Final    URINE, RANDOM Performed at [facility], [address], [place], [place] [address]    Special Requests   Final    NONE Performed at [facility], [address], [place], [place] [address]    Culture   Final    NO GROWTH Performed at [facility], [place] [address] [place], [place] [address]    Report Status 07/14/2018 FINAL  Final  MRSA PCR Screening     Status: None   Collection Time: 07/12/18  3:38 PM  Result Value Ref Range Status   MRSA by PCR NEGATIVE NEGATIVE Final    Comment:        The GeneXpert MRSA Assay (FDA approved  for NASAL specimens only), is one component of a comprehensive MRSA colonization surveillance program. It is not intended to diagnose MRSA infection nor to guide or monitor treatment for MRSA infections. Performed at [facility], [address], [place], [place] [address]     Procedures and diagnostic studies:  No results found.  Medications:   . aspirin  81 mg Oral Daily  . atorvastatin  80 mg Oral q1800  . carvedilol  6.25 mg Oral BID  . citalopram  20 mg Oral Daily  . enoxaparin (LOVENOX) injection  50 mg Subcutaneous Q24H  . furosemide  40 mg Intravenous BID  . insulin aspart  0-20 Units Subcutaneous TID WC  . insulin aspart  0-5 Units Subcutaneous QHS  . insulin aspart  3 Units Subcutaneous TID WC  . insulin glargine  30 Units Subcutaneous QHS  . losartan  25 mg Oral Daily  . pantoprazole  40 mg Oral Daily  . rOPINIRole  0.5-1 mg Oral QHS  . sodium chloride flush  3 mL Intravenous Q12H  . sodium chloride flush  3 mL Intravenous Q12H   Continuous Infusions: . sodium chloride       LOS: 3 days   Jacquelynn Cree  [facility] Pager [phone]. If unable to reach me by pager, please call my cell phone at [phone].  *Please refer to [email or web address], password [email or web address] to get updated schedule on who will round on this patient, as hospitalists switch teams weekly. If 7PM-7AM, please contact night-coverage at [email or web address], password [email or web address] for any overnight needs.  07/15/2018, 8:13  AM

## 2018-07-15 NOTE — Progress Notes (Signed)
Progress Note  Patient Name: Shane Frye Date of Encounter: 07/15/2018  Primary Cardiologist:  Branch/Kelly  Subjective   No chest pain feels lousy Long discussion about need to quit drugs and get BS under control He does not want family to know about cocaine " They will through it in my face"  Inpatient Medications    Scheduled Meds: . aspirin  81 mg Oral Daily  . atorvastatin  80 mg Oral q1800  . carvedilol  6.25 mg Oral BID  . citalopram  20 mg Oral Daily  . enoxaparin (LOVENOX) injection  50 mg Subcutaneous Q24H  . furosemide  40 mg Intravenous BID  . insulin aspart  0-20 Units Subcutaneous TID WC  . insulin aspart  0-5 Units Subcutaneous QHS  . insulin aspart  3 Units Subcutaneous TID WC  . insulin glargine  30 Units Subcutaneous QHS  . [START ON 07/16/2018] losartan  50 mg Oral Daily  . pantoprazole  40 mg Oral Daily  . rOPINIRole  0.5-1 mg Oral QHS  . sodium chloride flush  3 mL Intravenous Q12H  . sodium chloride flush  3 mL Intravenous Q12H   Continuous Infusions: . sodium chloride     PRN Meds: sodium chloride, acetaminophen, albuterol, ondansetron (ZOFRAN) IV, oxyCODONE-acetaminophen **AND** oxyCODONE, sodium chloride flush   Vital Signs    Vitals:   07/14/18 1935 07/15/18 0556 07/15/18 0813 07/15/18 0816  BP: 115/90 109/83 (!) 135/99 (!) 135/99  Pulse: 80 76 79 79  Resp: 13 14 (!) 22   Temp: 97.8 F (36.6 C) 98.1 F (36.7 C) 97.9 F (36.6 C)   TempSrc: Oral Oral Oral   SpO2: 97% 96% 99%   Weight:  100.2 kg    Height:        Intake/Output Summary (Last 24 hours) at 07/15/2018 0852 Last data filed at 07/15/2018 0254 Gross per 24 hour  Intake 659.48 ml  Output 2850 ml  Net -2190.52 ml   Last 3 Weights 07/15/2018 07/14/2018 07/13/2018  Weight (lbs) 221 lb 223 lb 222 lb 0.1 oz  Weight (kg) 100.245 kg 101.152 kg 100.7 kg      Telemetry    NSR 07/15/2018  - Personally Reviewed  ECG    SR ICLBBB lateral T wave changes  - Personally Reviewed   Physical Exam  Overweight white male  GEN: No acute distress.   Neck: No JVD Cardiac: RRR, no murmurs, rubs, or gallops. Post sternotomy  Respiratory: Clear to auscultation bilaterally. GI: Soft, nontender, non-distended  MS: No edema; No deformity. Neuro:  Nonfocal  Psych: Normal affect   Labs    Chemistry Recent Labs  Lab 07/12/18 1550 07/13/18 0433 07/14/18 1301 07/14/18 1302 07/15/18 0304  NA 135 136 135 136 135  K 3.9 3.8 4.0 4.0 3.7  CL 97* 98  --   --  98  CO2 24 27  --   --  29  GLUCOSE 310* 186*  --   --  156*  BUN 22* 22*  --   --  20  CREATININE 0.97 0.95  --   --  1.02  CALCIUM 8.7* 8.1*  --   --  8.2*  GFRNONAA >60 >60  --   --  >60  GFRAA >60 >60  --   --  >60  ANIONGAP 14 11  --   --  8     Hematology Recent Labs  Lab 07/12/18 1004 07/13/18 0433 07/14/18 1301 07/14/18 1302  WBC 14.2* 15.4*  --   --  RBC 4.96 5.06  --   --   HGB 13.2 13.4 15.0 15.0  HCT 40.9 43.0 44.0 44.0  MCV 82.5 85.0  --   --   MCH 26.6 26.5  --   --   MCHC 32.3 31.2  --   --   RDW 13.3 13.4  --   --   PLT 273 277  --   --     Cardiac Enzymes Recent Labs  Lab 07/12/18 1004 07/12/18 1550 07/12/18 2124  TROPONINI 0.18* 0.21* 0.22*   No results for input(s): TROPIPOC in the last 168 hours.   BNP Recent Labs  Lab 07/12/18 1004  BNP 1,082.0*     DDimer No results for input(s): DDIMER in the last 168 hours.   Radiology    No results found.  Cardiac Studies   Echo EF 25-30%    Patient Profile     53 y.o. male history of CABG 2015 admitted with DKA hisotry of cocaine abouse and new onset systolic CHF with elevated troponin  Assessment & Plan    CAD/CABG:  New onset CHF with ischemic DCM cath yesterday with occluded grafts and no native targets Living off LIMA graft. Medical Rx for CAD  CHF:  EF 25-30% by echo BNP 1082 PCWP still elevated at cath 29 mmHg Good diuresis with lasix Increase losartan to 50 mg He may be a candidate for BiV AICD if he can get  his BS under control and have negative urine drug screens will change to PO diuretic ? Add aldactone in am will not be ready for d/c likely till Saturday   DKA:  Per primary service  Substance Abuse:  Cocaine positive on admission        For questions or updates, please contact [facility] Please consult [email or web address] for contact info under        Signed, Jenkins Rouge, MD  07/15/2018, 8:52 AM

## 2018-07-16 ENCOUNTER — Telehealth: Payer: Self-pay

## 2018-07-16 DIAGNOSIS — E1165 Type 2 diabetes mellitus with hyperglycemia: Secondary | ICD-10-CM

## 2018-07-16 DIAGNOSIS — Z951 Presence of aortocoronary bypass graft: Secondary | ICD-10-CM

## 2018-07-16 DIAGNOSIS — I10 Essential (primary) hypertension: Secondary | ICD-10-CM

## 2018-07-16 DIAGNOSIS — E785 Hyperlipidemia, unspecified: Secondary | ICD-10-CM

## 2018-07-16 DIAGNOSIS — Z9119 Patient's noncompliance with other medical treatment and regimen: Secondary | ICD-10-CM

## 2018-07-16 DIAGNOSIS — E1159 Type 2 diabetes mellitus with other circulatory complications: Secondary | ICD-10-CM

## 2018-07-16 LAB — BASIC METABOLIC PANEL
Anion gap: 10 (ref 5–15)
BUN: 16 mg/dL (ref 6–20)
CO2: 28 mmol/L (ref 22–32)
Calcium: 8.1 mg/dL — ABNORMAL LOW (ref 8.9–10.3)
Chloride: 97 mmol/L — ABNORMAL LOW (ref 98–111)
Creatinine, Ser: 1 mg/dL (ref 0.61–1.24)
GFR calc Af Amer: >60 mL/min (ref >60)
GFR calc non Af Amer: >60 mL/min (ref >60)
Glucose, Bld: 126 mg/dL — ABNORMAL HIGH (ref 70–99)
Potassium: 3.4 mmol/L — ABNORMAL LOW (ref 3.5–5.1)
Sodium: 135 mmol/L (ref 135–145)

## 2018-07-16 LAB — GLUCOSE, CAPILLARY: Glucose-Capillary: 173 mg/dL — ABNORMAL HIGH (ref 70–99)

## 2018-07-16 MED ORDER — SPIRONOLACTONE 25 MG PO TABS: 12.5000 mg | ORAL_TABLET | Freq: Every day | ORAL | 2 refills | Status: DC

## 2018-07-16 MED ORDER — ATORVASTATIN CALCIUM 80 MG PO TABS: 80.0000 mg | ORAL_TABLET | Freq: Every day | ORAL | 2 refills | Status: DC

## 2018-07-16 MED ORDER — LOSARTAN POTASSIUM 50 MG PO TABS: 50.0000 mg | ORAL_TABLET | Freq: Every day | ORAL | 2 refills | Status: DC

## 2018-07-16 MED ORDER — CARVEDILOL 6.25 MG PO TABS: 12.5000 mg | ORAL_TABLET | Freq: Two times a day (BID) | ORAL | 2 refills | Status: DC

## 2018-07-16 NOTE — Discharge Summary (Signed)
Physician Discharge Summary  Shane Frye WNI:[id number] DOB: 1965-09-30 DOA: 07/12/2018  PCP: Terald Sleeper, PA-C  Admit date: 07/12/2018 Discharge date: 07/16/2018  Admitted From: Home Discharge disposition: Home   Recommendations for Outpatient Follow-Up:   1. F/U with Particia Nearing.  Will need to assess compliance with medications, particularly glycemic control. 2. Cardiology follow-up recommended with Dr. Harl Bowie in 1 week to ensure heart failure symptoms well controlled.   Discharge Diagnosis:   Principal Problem:   Acute systolic and diastolic CHF (congestive heart failure) (HCC) Active Problems:   HLD (hyperlipidemia)   Cocaine abuse ([place])   H/O noncompliance with medical treatment, presenting hazards to health   CAD (coronary artery disease), native coronary artery   S/P CABG x 6   PAF (paroxysmal atrial fibrillation) ([place])   Hypertension   Uncontrolled diabetes mellitus with circulatory complication (HCC)   Hyperosmolar non-ketotic state in patient with type 2 diabetes mellitus ([place])    Discharge Condition: Improved.  Diet recommendation: Low sodium, heart healthy.  Carbohydrate-modified.    Wound care: None.  Code status: Full.   History of Present Illness:   Shane Frye is an 53 y.o. male with a PMH of CAD status post CABG in 2015, COPD, ongoing tobacco abuse, CHF, type 2 diabetes, medical nonadherence and hyperlipidemia who was admitted 07/11/2017 for evaluation of a 3-day history of shortness of breath associated with increasing lower extremity edema and increasing abdominal girth.  He apparently presented to an outside hospital on 07/11/2018 where he was given a dose of IV furosemide and subsequently left AMA.  On admission, he was noted to have a serum glucose of 578, creatinine 1.4, anion gap 17, WBC 14.2 and a chest x-ray that showed pulmonary vascular congestion.   Hospital Course by Problem:   Principal Problem:   Acute systolic and diastolic CHF  (congestive heart failure) (HCC)/elevated troponin/coronary artery disease status post CABG x6/ nonsustained ventricular tachycardia/unstable angina Admission chest x-ray showed cardiomegaly and mild edema.  Troponins elevated 0.18, 0.21, 0.22.  Placed on furosemide.  12 lead EKG showed nonspecific ST wave changes.  2D echo showed an EF of 38-18% with diastolic dysfunction, with a change in his EF from prior echocardiogram (55-60%).  Telemetry showed a 10 beat run of nonsustained ventricular tachycardia.  Cardiology consulted and underwent cardiac catheterization 07/14/2018 with findings of severe three-vessel CAD with occluded vein grafts (the only patent graft out of 5 was the LIMA to the LAD).  Unfortunately, PCI not an option.  Aggressive medical therapy recommended.  Remained volume overloaded post cardiac catheterization with a wedge pressure of 29 mmHg requiring further IV diuresis.  I/O -2.1 L/24 hours. Continue Coreg, losartan, and Lasix (remains on IV).  Cardiology to consider transitioning to [facility] with spironolactone.  Renal function stable post cardiac catheterization.  To follow-up with Dr. Harl Bowie post discharge.  Discharged on 20 mg of Lasix daily, 12.5 mg of spironolactone daily, Coreg 3.25 mg twice daily and losartan for heart failure management.  Active Problems:   Tobacco abuse Tobacco cessation counseling requested and provided by nursing staff.    HLD (hyperlipidemia) Pravachol switched to Lipitor at discharge.    Cocaine abuse ([place]) UDS shows cocaine use.  Social worker consulted for substance abuse counseling and referral to [facility]. Counseled by myself as well.  Declines further outpatient follow-up.    H/O noncompliance with medical treatment, presenting hazards to health Likely complicated by drug use.    PAF (paroxysmal atrial fibrillation) (HCC) Currently in  normal sinus rhythm, not on anticoagulation given history of noncompliance.    Hypertension Continue  carvedilol.  Losartan added 07/14/2018.  Also will be on Lasix, and Aldactone.    Type 2 diabetes mellitus with other circulatory complications (HCC)/   Hyperosmolar non-ketotic state in patient with type 2 diabetes mellitus ([place]) Managed with IV insulin and judicious fluid resuscitation on admission.  Metformin held.  Was able to be transitioned to subcutaneous insulin once anion gap closed and his diet was advanced at that time.  Hemoglobin A1c of 9.5% indicates poor outpatient controlled, likely due to underlying nonadherence to treatment.  Seen by the diabetes coordinator 07/13/2018 with recommendations to add 5 units of meal coverage.  Managed with 30 units of Lantus, insulin resistant SSI and 3 units of meal coverage.  CBGs currently well controlled 146-171.  Preadmission regimen resumed at discharge but will need close follow-up to ensure glycemic control.    Chronic pain syndrome Currently being managed with home dose of Percocet, gabapentin and Celexa.  Body mass index is 32.64 kg/m.    Medical Consultants:    Cardiology   Discharge Exam:   Vitals:   07/16/18 0835 07/16/18 0850  BP: 120/81 120/81  Pulse: 77 77  Resp:    Temp: 97.9 F (36.6 C)   SpO2: 96%    Vitals:   07/16/18 0345 07/16/18 0500 07/16/18 0835 07/16/18 0850  BP: 101/75  120/81 120/81  Pulse:   77 77  Resp: 11 14    Temp: 97.9 F (36.6 C)  97.9 F (36.6 C)   TempSrc: Oral  Oral   SpO2: 94%  96%   Weight:  98.2 kg    Height:        General exam: Appears calm and comfortable.  Respiratory system: Clear to auscultation. Respiratory effort normal. Cardiovascular system: S1 & S2 heard, RRR. No JVD,  rubs, gallops or clicks. No murmurs. Gastrointestinal system: Abdomen is nondistended, soft and nontender. No organomegaly or masses felt. Normal bowel sounds heard. Central nervous system: Alert and oriented. No focal neurological deficits. Extremities: No clubbing,  or cyanosis. No edema. Skin: No  rashes, lesions or ulcers. Psychiatry: Judgement and insight appear impaired.  Mood & affect flat.    The results of significant diagnostics from this hospitalization (including imaging, microbiology, ancillary and laboratory) are listed below for reference.     Procedures and Diagnostic Studies:   Dg Chest Portable 1 View  Result Date: 07/12/2018 CLINICAL DATA:  Shortness of breath. EXAM: PORTABLE CHEST 1 VIEW COMPARISON:  07/11/2018 FINDINGS: Sequelae of CABG are again identified. The cardiac silhouette is mildly enlarged. Mild central pulmonary vascular congestion is again noted without overt edema. No airspace consolidation, sizeable pleural effusion, or pneumothorax is identified. No acute osseous abnormality is seen. IMPRESSION: Cardiomegaly and mild pulmonary vascular congestion. Electronically Signed   By: Logan Bores M.D.   On: 07/12/2018 10:19     Labs:   Basic Metabolic Panel: Recent Labs  Lab 07/12/18 1357 07/12/18 1550 07/13/18 0433 07/14/18 1301 07/14/18 1302 07/15/18 0304 07/16/18 0329  NA 132* 135 136 135 136 135 135  K 3.8 3.9 3.8 4.0 4.0 3.7 3.4*  CL 93* 97* 98  --   --  98 97*  CO2 23 24 27   --   --  29 28  GLUCOSE 401* 310* 186*  --   --  156* 126*  BUN 23* 22* 22*  --   --  20 16  CREATININE 1.05 0.97 0.95  --   --  1.02 1.00  CALCIUM 8.5* 8.7* 8.1*  --   --  8.2* 8.1*  MG  --  2.3 2.3  --   --   --   --    GFR Estimated Creatinine Clearance: 96.5 mL/min (by C-G formula based on SCr of 1 mg/dL).  CBC: Recent Labs  Lab 07/12/18 1004 07/13/18 0433 07/14/18 1301 07/14/18 1302  WBC 14.2* 15.4*  --   --   NEUTROABS 12.4*  --   --   --   HGB 13.2 13.4 15.0 15.0  HCT 40.9 43.0 44.0 44.0  MCV 82.5 85.0  --   --   PLT 273 277  --   --    Cardiac Enzymes: Recent Labs  Lab 07/12/18 1004 07/12/18 1550 07/12/18 2124  TROPONINI 0.18* 0.21* 0.22*   CBG: Recent Labs  Lab 07/15/18 0600 07/15/18 1134 07/15/18 1714 07/15/18 2128 07/16/18 0613   GLUCAP 146* 182* 197* 215* 173*   Microbiology Recent Results (from the past 240 hour(s))  SARS Coronavirus 2 (CEPHEID - Performed in [place] hospital lab), Hosp Order     Status: None   Collection Time: 07/12/18  2:01 PM  Result Value Ref Range Status   SARS Coronavirus 2 NEGATIVE NEGATIVE Final    Comment: (NOTE) If result is NEGATIVE SARS-CoV-2 target nucleic acids are NOT DETECTED. The SARS-CoV-2 RNA is generally detectable in upper and lower  respiratory specimens during the acute phase of infection. The lowest  concentration of SARS-CoV-2 viral copies this assay can detect is 250  copies / mL. A negative result does not preclude SARS-CoV-2 infection  and should not be used as the sole basis for treatment or other  patient management decisions.  A negative result may occur with  improper specimen collection / handling, submission of specimen other  than nasopharyngeal swab, presence of viral mutation(s) within the  areas targeted by this assay, and inadequate number of viral copies  (<250 copies / mL). A negative result must be combined with clinical  observations, patient history, and epidemiological information. If result is POSITIVE SARS-CoV-2 target nucleic acids are DETECTED. The SARS-CoV-2 RNA is generally detectable in upper and lower  respiratory specimens dur ing the acute phase of infection.  Positive  results are indicative of active infection with SARS-CoV-2.  Clinical  correlation with patient history and other diagnostic information is  necessary to determine patient infection status.  Positive results do  not rule out bacterial infection or co-infection with other viruses. If result is PRESUMPTIVE POSTIVE SARS-CoV-2 nucleic acids MAY BE PRESENT.   A presumptive positive result was obtained on the submitted specimen  and confirmed on repeat testing.  While 2019 novel coronavirus  (SARS-CoV-2) nucleic acids may be present in the submitted sample  additional  confirmatory testing may be necessary for epidemiological  and / or clinical management purposes  to differentiate between  SARS-CoV-2 and other Sarbecovirus currently known to infect humans.  If clinically indicated additional testing with an alternate test  methodology [phone]) is advised. The SARS-CoV-2 RNA is generally  detectable in upper and lower respiratory sp ecimens during the acute  phase of infection. The expected result is Negative. Fact Sheet for Patients:  StrictlyIdeas.no Fact Sheet for Healthcare Providers: BankingDealers.co.za This test is not yet approved or cleared by the [place] FDA and has been authorized for detection and/or diagnosis of SARS-CoV-2 by FDA under an Emergency Use Authorization (EUA).  This EUA will remain in effect (meaning this test can be  used) for the duration of the COVID-19 declaration under Section 564(b)(1) of the Act, 21 U.S.C. section 360bbb-3(b)(1), unless the authorization is terminated or revoked sooner. Performed at [facility], [address] [place], [place] [address]   Culture, Urine     Status: None   Collection Time: 07/12/18  3:19 PM  Result Value Ref Range Status   Specimen Description   Final    URINE, RANDOM Performed at [facility], [address], [place], [place] [address]    Special Requests   Final    NONE Performed at [facility], [address], [place], [place] [address]    Culture   Final    NO GROWTH Performed at [facility], [place] [address] [place], [place] [address]    Report Status 07/14/2018 FINAL  Final  MRSA PCR Screening     Status: None   Collection Time: 07/12/18  3:38 PM  Result Value Ref Range Status   MRSA by PCR NEGATIVE NEGATIVE Final    Comment:        The GeneXpert MRSA Assay (FDA approved for NASAL specimens only), is one component of a comprehensive MRSA colonization surveillance program. It is not intended to diagnose MRSA  infection nor to guide or monitor treatment for MRSA infections. Performed at [facility], [address], [place], [place] [address]      Discharge Instructions:   Discharge Instructions    ([place]) Call MD:  Anytime you have any of the following symptoms: 1) 3 pound weight gain in 24 hours or 5 pounds in 1 week 2) shortness of breath, with or without a dry hacking cough 3) swelling in the hands, feet or stomach 4) if you have to sleep on extra pillows at night in order to breathe.   Complete by:  As directed    Avoid straining   Complete by:  As directed    Call MD for:  difficulty breathing, headache or visual disturbances   Complete by:  As directed    Call MD for:  extreme fatigue   Complete by:  As directed    Call MD for:  severe uncontrolled pain   Complete by:  As directed    Diet - low sodium heart healthy   Complete by:  As directed    Discharge instructions   Complete by:  As directed    You were treated for heart failure in the hospital.  To prevent exacerbations of your heart failure, it is important that you check your weight at the same time every day, and that if you gain over 3 pounds in 24 hours or 5 lbs in 1 week, OR you develop worsening swelling to the legs, experience more shortness of breath or chest pain, take an extra dose of Lasix and call your Primary MD or cardiologist.   Follow a heart healthy, low salt diet and restrict your fluid intake to 1.5 liters a day or less.   Heart Failure patients record your daily weight using the same scale at the same time of day   Complete by:  As directed    Increase activity slowly   Complete by:  As directed    STOP any activity that causes chest pain, shortness of breath, dizziness, sweating, or exessive weakness   Complete by:  As directed      Allergies as of 07/16/2018   No Known Allergies     Medication List    STOP taking these  medications   benzonatate 100 MG capsule Commonly known as:   TESSALON   lisinopril 20 MG tablet Commonly known as:  ZESTRIL   lovastatin 20 MG tablet Commonly known as:  MEVACOR     TAKE these medications   albuterol 108 (90 Base) MCG/ACT inhaler Commonly known as:  VENTOLIN HFA Inhale 2 puffs into the lungs every 6 (six) hours as needed for wheezing or shortness of breath.   aspirin 81 MG tablet Take 81 mg by mouth daily.   atorvastatin 80 MG tablet Commonly known as:  LIPITOR Take 1 tablet (80 mg total) by mouth daily at 6 PM.   carvedilol 6.25 MG tablet Commonly known as:  COREG Take 2 tablets (12.5 mg total) by mouth 2 (two) times daily. What changed:  medication strength   citalopram 20 MG tablet Commonly known as:  CELEXA Take 1 tablet (20 mg total) by mouth daily.   furosemide 20 MG tablet Commonly known as:  LASIX Take 1 tablet (20 mg total) by mouth every morning.   gabapentin 300 MG capsule Commonly known as:  NEURONTIN Take 3 capsules (900 mg total) by mouth 3 (three) times daily. Slowly increase as discussed.   glucose blood test strip Use as instructed   insulin aspart protamine- aspart (70-30) 100 UNIT/ML injection Commonly known as:  NOVOLOG MIX 70/30 Inject 0.15-0.3 mLs (15-30 Units total) into the skin 3 (three) times daily. What changed:  how much to take   Insulin Syringe-Needle U-100 31G X 5/16" 0.3 ML Misc Commonly known as:  RELION INSULIN SYR 0.3ML/31G USE ONE SYRINGE EACH TIME THREE TIMES DAILY   losartan 50 MG tablet Commonly known as:  COZAAR Take 1 tablet (50 mg total) by mouth daily.   metFORMIN 1000 MG tablet Commonly known as:  GLUCOPHAGE Take 1 tablet (1,000 mg total) by mouth 2 (two) times daily with a meal.   oxyCODONE-acetaminophen 10-325 MG tablet Commonly known as:  PERCOCET Take 1 tablet by mouth every 8 (eight) hours as needed for pain. CHRONIC PAIN   pantoprazole 40 MG tablet Commonly known as:  PROTONIX Take 1 tablet (40 mg total) by mouth daily.   rOPINIRole 0.5 MG  tablet Commonly known as:  REQUIP Take 1-2 tablets (0.5-1 mg total) by mouth at bedtime.   spironolactone 25 MG tablet Commonly known as:  Aldactone Take 0.5 tablets (12.5 mg total) by mouth daily.      Follow-up Information    Terald Sleeper, PA-C Follow up.   Specialties:  Physician Assistant, Family Medicine Contact information: [place] [address] [phone]            Time coordinating discharge: 35 minutes.  SignedMargreta Journey   Pager [phone] [facility] 07/16/2018, 10:00 AM

## 2018-07-16 NOTE — Evaluation (Signed)
Physical Therapy Evaluation and D/C Patient Details Name: Shane Frye MRN: [id number] DOB: [birth year] Today's Date: 07/16/2018   History of Present Illness  Shane Frye an 53 y.o.malewith a PMH of CAD status post CABG in 2015, COPD, ongoing tobacco abuse, CHF, type 2 diabetes, medical nonadherence and hyperlipidemia who was admitted 07/11/2017 for evaluation of a 3-day history of shortness of breath associated with increasing lower extremity edema and increasing abdominal girth. He apparently presented to an outside hospital on 07/11/2018 where he was given a dose of IV furosemide and subsequently left AMA. On admission, he was noted to have a serum glucose of 578, creatinine 1.4, anion gap 17, WBC 14.2 and a chest x-ray that showed pulmonary vascular congestion.  Clinical Impression  Pt admitted with above diagnosis. Pt currently without significant functional limitations and is independent.  HAs equipment at home. Does not need skilled PT.  Will sign off.      Follow Up Recommendations No PT follow up    Equipment Recommendations  None recommended by PT    Recommendations for Other Services       Precautions / Restrictions Precautions Precautions: Fall Restrictions Weight Bearing Restrictions: No      Mobility  Bed Mobility Overal bed mobility: Independent                Transfers Overall transfer level: Independent                  Ambulation/Gait Ambulation/Gait assistance: Independent   Assistive device: None       General Gait Details: Pt had no issues with balance.   Stairs            Wheelchair Mobility    Modified Rankin (Stroke Patients Only)       Balance                                 Standardized Balance Assessment Standardized Balance Assessment : Dynamic Gait Index   Dynamic Gait Index Level Surface: Normal Change in Gait Speed: Normal Gait with Horizontal Head Turns: Normal Gait with Vertical  Head Turns: Normal Gait and Pivot Turn: Normal Step Over Obstacle: Normal Step Around Obstacles: Normal Steps: Normal Total Score: 24       Pertinent Vitals/Pain Pain Assessment: No/denies pain    Home Living Family/patient expects to be discharged to:: Private residence Living Arrangements: Parent Available Help at Discharge: Family;Available 24 hours/day Type of Home: House Home Access: Stairs to enter Entrance Stairs-Rails: Right;Left;Can reach both Entrance Stairs-Number of Steps: 4 Home Layout: One level Home Equipment: Walker - 2 wheels;Shower seat;Cane - single point      Prior Function Level of Independence: Independent with assistive device(s)         Comments: used cane at times     Hand Dominance   Dominant Hand: Right    Extremity/Trunk Assessment   Upper Extremity Assessment Upper Extremity Assessment: Defer to OT evaluation    Lower Extremity Assessment Lower Extremity Assessment: Generalized weakness    Cervical / Trunk Assessment Cervical / Trunk Assessment: Normal  Communication   Communication: No difficulties  Cognition Arousal/Alertness: Awake/alert Behavior During Therapy: WFL for tasks assessed/performed Overall Cognitive Status: Within Functional Limits for tasks assessed  General Comments      Exercises     Assessment/Plan    PT Assessment Patent does not need any further PT services  PT Problem List         PT Treatment Interventions      PT Goals (Current goals can be found in the Care Plan section)  Acute Rehab PT Goals Patient Stated Goal: to go home today PT Goal Formulation: All assessment and education complete, DC therapy    Frequency     Barriers to discharge        Co-evaluation               AM-PAC PT "6 Clicks" Mobility  Outcome Measure Help needed turning from your back to your side while in a flat bed without using bedrails?:  None Help needed moving from lying on your back to sitting on the side of a flat bed without using bedrails?: None Help needed moving to and from a bed to a chair (including a wheelchair)?: None Help needed standing up from a chair using your arms (e.g., wheelchair or bedside chair)?: None Help needed to walk in hospital room?: None Help needed climbing 3-5 steps with a railing? : None 6 Click Score: 24    End of Session   Activity Tolerance: Patient tolerated treatment well Patient left: in chair;with call bell/phone within reach Nurse Communication: Mobility status      Time: 0814-4818 PT Time Calculation (min) (ACUTE ONLY): 12 min   Charges:   PT Evaluation $PT Eval Low Complexity: 1 Low          Rayan Dyal,PT Acute Rehabilitation Services Pager:  [phone]  Office:  [phone]    Denice Paradise 07/16/2018, 2:03 PM

## 2018-07-16 NOTE — Progress Notes (Signed)
8 beat run of V tach reported by telemetry monitor. Pt resting in bed with eyes closed when room entered, aroused easily to verbal stimulation. Pt denied CP or SOB, agreed to inform staff should either manifest.

## 2018-07-16 NOTE — Progress Notes (Signed)
Pt provided discharge instructions and education. Pt denies any complaints. Vitals stable. IV removed and intact. Telebox removed/ccmd notified. Pt has all belongings. Volunteers to tx pt via wheelchair to valet to meet ride.  Jerald Kief, RN

## 2018-07-16 NOTE — Progress Notes (Signed)
Progress Note  Patient Name: Shane Frye Date of Encounter: 07/16/2018  Primary Cardiologist:  Branch/Kelly  Subjective   No chest pain great diuresis      Inpatient Medications    Scheduled Meds: . aspirin  81 mg Oral Daily  . atorvastatin  80 mg Oral q1800  . carvedilol  6.25 mg Oral BID  . citalopram  20 mg Oral Daily  . enoxaparin (LOVENOX) injection  50 mg Subcutaneous Q24H  . furosemide  40 mg Intravenous BID  . insulin aspart  0-20 Units Subcutaneous TID WC  . insulin aspart  0-5 Units Subcutaneous QHS  . insulin aspart  3 Units Subcutaneous TID WC  . insulin glargine  30 Units Subcutaneous QHS  . losartan  50 mg Oral Daily  . pantoprazole  40 mg Oral Daily  . rOPINIRole  0.5-1 mg Oral QHS  . sodium chloride flush  3 mL Intravenous Q12H  . sodium chloride flush  3 mL Intravenous Q12H   Continuous Infusions: . sodium chloride     PRN Meds: sodium chloride, acetaminophen, albuterol, ondansetron (ZOFRAN) IV, oxyCODONE-acetaminophen **AND** oxyCODONE, sodium chloride flush   Vital Signs    Vitals:   07/15/18 1015 07/15/18 1943 07/16/18 0345 07/16/18 0500  BP:  128/87 101/75   Pulse:  73    Resp: 15 (!) 23 11 14   Temp:  97.8 F (36.6 C) 97.9 F (36.6 C)   TempSrc:  Oral Oral   SpO2:  98% 94%   Weight:    98.2 kg  Height:        Intake/Output Summary (Last 24 hours) at 07/16/2018 0758 Last data filed at 07/16/2018 0345 Gross per 24 hour  Intake 240 ml  Output 4100 ml  Net -3860 ml   Last 3 Weights 07/16/2018 07/15/2018 07/14/2018  Weight (lbs) 216 lb 9.6 oz 221 lb 223 lb  Weight (kg) 98.249 kg 100.245 kg 101.152 kg      Telemetry    NSR 07/16/2018  - Personally Reviewed  ECG    SR ICLBBB lateral T wave changes  - Personally Reviewed  Physical Exam  Overweight white male  GEN: No acute distress.   Neck: No JVD Cardiac: RRR, no murmurs, rubs, or gallops. Post sternotomy  Respiratory: Clear to auscultation bilaterally. GI: Soft, nontender,  non-distended  MS: No edema; No deformity. Neuro:  Nonfocal  Psych: Normal affect   Labs    Chemistry Recent Labs  Lab 07/13/18 0433  07/14/18 1302 07/15/18 0304 07/16/18 0329  NA 136   < > 136 135 135  K 3.8   < > 4.0 3.7 3.4*  CL 98  --   --  98 97*  CO2 27  --   --  29 28  GLUCOSE 186*  --   --  156* 126*  BUN 22*  --   --  20 16  CREATININE 0.95  --   --  1.02 1.00  CALCIUM 8.1*  --   --  8.2* 8.1*  GFRNONAA >60  --   --  >60 >60  GFRAA >60  --   --  >60 >60  ANIONGAP 11  --   --  8 10   < > = values in this interval not displayed.     Hematology Recent Labs  Lab 07/12/18 1004 07/13/18 0433 07/14/18 1301 07/14/18 1302  WBC 14.2* 15.4*  --   --   RBC 4.96 5.06  --   --   HGB  13.2 13.4 15.0 15.0  HCT 40.9 43.0 44.0 44.0  MCV 82.5 85.0  --   --   MCH 26.6 26.5  --   --   MCHC 32.3 31.2  --   --   RDW 13.3 13.4  --   --   PLT 273 277  --   --     Cardiac Enzymes Recent Labs  Lab 07/12/18 1004 07/12/18 1550 07/12/18 2124  TROPONINI 0.18* 0.21* 0.22*   No results for input(s): TROPIPOC in the last 168 hours.   BNP Recent Labs  Lab 07/12/18 1004  BNP 1,082.0*     DDimer No results for input(s): DDIMER in the last 168 hours.   Radiology    No results found.  Cardiac Studies   Echo EF 25-30%    Patient Profile     53 y.o. 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 Medical Rx for CAD  CHF:  EF 25-30% by echo BNP 1082 PCWP still elevated at cath 29 mmHg Good diuresis with lasix Increase losartan to 50 mg He may be a candidate for BiV AICD if he can get his BS under control and have negative urine drug screens Ok to d/c home from our standpoint with 20 mg lasix PO and 12.5 mg PO aldactone f/u BMET in 2 weeks  DKA:  Per primary  service  Substance Abuse:  Cocaine positive on admission     Will sign off Arrange outpatient f/u with Dr Harl Bowie in [place] in 2 weeks   For questions or updates, please contact [facility] Please consult [email or web address] for contact info under        Signed, Jenkins Rouge, MD  07/16/2018, 7:58 AM

## 2018-07-16 NOTE — Telephone Encounter (Signed)
-----   Message from Arnoldo Lenis, MD sent at 07/16/2018 10:52 AM EDT ----- Thanks, we will arrange. Lattie Haw can you arrange 2 week f/u with me or PA as well as a BMET and BNP at that time   Zandra Abts MD ----- Message ----- From: Josue Hector, MD Sent: 07/16/2018   8:03 AM EDT To: Arnoldo Lenis, MD, [facility] Scheduling  Needs outpatient [facility] f/u [place] [facility] Harl Bowie in 2 weeks Needs BMET / BNP in 2 weeks

## 2018-07-16 NOTE — Discharge Instructions (Signed)

## 2018-07-20 ENCOUNTER — Telehealth: Payer: Self-pay | Admitting: Cardiology

## 2018-07-20 NOTE — Telephone Encounter (Signed)
Returned pt's sons Truman Hayward) call. He stated that his dad does not have any energy at all, looks very pale, and looks "puffy" in his face and abdominal area. He was thinking it could be form all the medication changes that was made in hospital. He states that he is not having any chest pains, nausea, dizziness or sweating. He did state he is having some SOB, but no worse than normal. He has not weighed since leaving hospital. He has not taken his vitals either. His son states that he will obtain the items to start doing that ASAP. He has changed his diet all around and is eating a lot better than he was in past.  He wanted a sooner appointment, and we were able to get him in on 07/26/2018 @ 11:30 with Dr. Harl Bowie. Pt's son was pleased with that date and time. He will keep up with weights and vitals prior to that appointment. I advised pt that if his father worsened then he wold need to take him to ED to be evaluated. Pt's son voiced understanding and thanked for the prompt call back.

## 2018-07-20 NOTE — Telephone Encounter (Signed)
Have him update [place] on his weight and vitals this week when he has them   Zandra Abts MD

## 2018-07-20 NOTE — Telephone Encounter (Signed)
Please give pt's son Truman Hayward a call @ [phone], called stating his Dad is needing a sooner apt w/ Dr. Harl Bowie. Pt is not doing better since being d/c from [facility] and they think the medications are not working.

## 2018-07-21 ENCOUNTER — Telehealth: Payer: Self-pay | Admitting: Cardiology

## 2018-07-21 NOTE — Telephone Encounter (Signed)
Virtual Visit Pre-Appointment Phone Call  "(Name), I am calling you today to discuss your upcoming appointment. We are currently trying to limit exposure to the virus that causes COVID-19 by seeing patients at home rather than in the office."  1. "What is the BEST phone number to call the day of the visit?" - include this in appointment notes  2. Do you have or have access to (through a family member/friend) a smartphone with video capability that we can use for your visit?" a. If yes - list this number in appt notes as cell (if different from BEST phone #) and list the appointment type as a VIDEO visit in appointment notes b. If no - list the appointment type as a PHONE visit in appointment notes  3. Confirm consent - "In the setting of the current Covid19 crisis, you are scheduled for a (phone or video) visit with your provider on (date) at (time).  Just as we do with many in-office visits, in order for you to participate in this visit, we must obtain consent.  If you'd like, I can send this to your mychart (if signed up) or email for you to review.  Otherwise, I can obtain your verbal consent now.  All virtual visits are billed to your insurance company just like a normal visit would be.  By agreeing to a virtual visit, we'd like you to understand that the technology does not allow for your provider to perform an examination, and thus may limit your provider's ability to fully assess your condition. If your provider identifies any concerns that need to be evaluated in person, we will make arrangements to do so.  Finally, though the technology is pretty good, we cannot assure that it will always work on either your or our end, and in the setting of a video visit, we may have to convert it to a phone-only visit.  In either situation, we cannot ensure that we have a secure connection.  Are you willing to proceed?" STAFF: Did the patient verbally acknowledge consent to telehealth visit? Document  YES/NO here: yes  4. Advise patient to be prepared - "Two hours prior to your appointment, go ahead and check your blood pressure, pulse, oxygen saturation, and your weight (if you have the equipment to check those) and write them all down. When your visit starts, your provider will ask you for this information. If you have an Apple Watch or Kardia device, please plan to have heart rate information ready on the day of your appointment. Please have a pen and paper handy nearby the day of the visit as well."  5. Give patient instructions for MyChart download to smartphone OR Doximity/Doxy.me as below if video visit (depending on what platform provider is using)  6. Inform patient they will receive a phone call 15 minutes prior to their appointment time (may be from unknown caller ID) so they should be prepared to answer    TELEPHONE CALL NOTE  Shane Frye has been deemed a candidate for a follow-up tele-health visit to limit community exposure during the Covid-19 pandemic. I spoke with the patient via phone to ensure availability of phone/video source, confirm preferred email & phone number, and discuss instructions and expectations.  I reminded Shane Frye to be prepared with any vital sign and/or heart rhythm information that could potentially be obtained via home monitoring, at the time of his visit. I reminded Shane Frye to expect a phone call prior to  his visit.  Shane Frye 07/21/2018 3:09 PM   INSTRUCTIONS FOR DOWNLOADING THE MYCHART APP TO SMARTPHONE  - The patient must first make sure to have activated MyChart and know their login information - If Apple, go to [employer] and type in MyChart in the search bar and download the app. If Android, ask patient to go to [employer] and type in [place] in the search bar and download the app. The app is free but as with any other app downloads, their phone may require them to verify saved payment information or Apple/Android  password.  - The patient will need to then log into the app with their MyChart username and password, and select [facility] as their healthcare provider to link the account. When it is time for your visit, go to the MyChart app, find appointments, and click Begin Video Visit. Be sure to Select Allow for your device to access the Microphone and Camera for your visit. You will then be connected, and your provider will be with you shortly.  **If they have any issues connecting, or need assistance please contact MyChart service desk [phone])**  **If using a computer, in order to ensure the best quality for their visit they will need to use either of the following Internet Browsers: [employer], or Google Chrome**  IF USING DOXIMITY or DOXY.ME - The patient will receive a link just prior to their visit by text.     FULL LENGTH CONSENT FOR TELE-HEALTH VISIT   I hereby voluntarily request, consent and authorize [facility] and its employed or contracted physicians, physician assistants, nurse practitioners or other licensed health care professionals (the Practitioner), to provide me with telemedicine health care services (the Services") as deemed necessary by the treating Practitioner. I acknowledge and consent to receive the Services by the Practitioner via telemedicine. I understand that the telemedicine visit will involve communicating with the Practitioner through live audiovisual communication technology and the disclosure of certain medical information by electronic transmission. I acknowledge that I have been given the opportunity to request an in-person assessment or other available alternative prior to the telemedicine visit and am voluntarily participating in the telemedicine visit.  I understand that I have the right to withhold or withdraw my consent to the use of telemedicine in the course of my care at any time, without affecting my right to future care or treatment,  and that the Practitioner or I may terminate the telemedicine visit at any time. I understand that I have the right to inspect all information obtained and/or recorded in the course of the telemedicine visit and may receive copies of available information for a reasonable fee.  I understand that some of the potential risks of receiving the Services via telemedicine include:   Delay or interruption in medical evaluation due to technological equipment failure or disruption;  Information transmitted may not be sufficient (e.g. poor resolution of images) to allow for appropriate medical decision making by the Practitioner; and/or   In rare instances, security protocols could fail, causing a breach of personal health information.  Furthermore, I acknowledge that it is my responsibility to provide information about my medical history, conditions and care that is complete and accurate to the best of my ability. I acknowledge that Practitioner's advice, recommendations, and/or decision may be based on factors not within their control, such as incomplete or inaccurate data provided by me or distortions of diagnostic images or specimens that may result from electronic transmissions. I  understand that the practice of medicine is not an exact science and that Practitioner makes no warranties or guarantees regarding treatment outcomes. I acknowledge that I will receive a copy of this consent concurrently upon execution via email to the email address I last provided but may also request a printed copy by calling the office of [facility].    I understand that my insurance will be billed for this visit.   I have read or had this consent read to me.  I understand the contents of this consent, which adequately explains the benefits and risks of the Services being provided via telemedicine.   I have been provided ample opportunity to ask questions regarding this consent and the Services and have had my questions  answered to my satisfaction.  I give my informed consent for the services to be provided through the use of telemedicine in my medical care  By participating in this telemedicine visit I agree to the above.

## 2018-07-26 ENCOUNTER — Telehealth (INDEPENDENT_AMBULATORY_CARE_PROVIDER_SITE_OTHER): Payer: Medicare HMO | Admitting: Cardiology

## 2018-07-26 ENCOUNTER — Telehealth: Payer: Self-pay

## 2018-07-26 ENCOUNTER — Encounter: Payer: Self-pay | Admitting: Cardiology

## 2018-07-26 VITALS — BP 126/99 | Ht 69.0 in | Wt 215.0 lb

## 2018-07-26 DIAGNOSIS — I10 Essential (primary) hypertension: Secondary | ICD-10-CM

## 2018-07-26 DIAGNOSIS — I2510 Atherosclerotic heart disease of native coronary artery without angina pectoris: Secondary | ICD-10-CM

## 2018-07-26 DIAGNOSIS — I5022 Chronic systolic (congestive) heart failure: Secondary | ICD-10-CM

## 2018-07-26 MED ORDER — SACUBITRIL-VALSARTAN 49-51 MG PO TABS: 1.0000 | ORAL_TABLET | Freq: Two times a day (BID) | ORAL | 3 refills | Status: DC

## 2018-07-26 NOTE — Telephone Encounter (Signed)
Free 30 day TRIAL Entresto e-scribed to [facility]

## 2018-07-26 NOTE — Addendum Note (Signed)
Addended by: Merlene Laughter on: 07/26/2018 12:15 PM   Modules accepted: Orders

## 2018-07-26 NOTE — Patient Instructions (Addendum)
Medication Instructions:   Your physician has recommended you make the following change in your medication:   Stop losartan  Start entresto 49/51 mg by mouth twice daily-voucher faxed to [facility]:  Your physician recommends that you return for lab work in: one week at [facility] to check your BMT & Mg levels. Your lab orders have been faxed.  Testing/Procedures:  NONE  Follow-Up:  Your physician recommends that you schedule a follow-up appointment in: 3 weeks.  Any Other Special Instructions Will Be Listed Below (If Applicable).  If you need a refill on your cardiac medications before your next appointment, please call your pharmacy.

## 2018-07-26 NOTE — Progress Notes (Signed)
Virtual Visit via Telephone Note   This visit type was conducted due to national recommendations for restrictions regarding the COVID-19 Pandemic (e.g. social distancing) in an effort to limit this patient's exposure and mitigate transmission in our community.  Due to his co-morbid illnesses, this patient is at least at moderate risk for complications without adequate follow up.  This format is felt to be most appropriate for this patient at this time.  The patient did not have access to video technology/had technical difficulties with video requiring transitioning to audio format only (telephone).  All issues noted in this document were discussed and addressed.  No physical exam could be performed with this format.  Please refer to the patient's chart for his  consent to telehealth for [facility].   Date:  07/26/2018   ID:  Shane Frye, DOB 11/14/1965, MRN [id number]  Patient Location: Home Provider Location: Office  PCP:  Terald Sleeper, PA-C  Cardiologist:  Carlyle Dolly, MD  Electrophysiologist:  None   Evaluation Performed:  Follow-Up Visit  Chief Complaint:  Hospital follow up  History of Present Illness:    Shane Frye is a 53 y.o. male seen today for follow up of the following medical problems.   1. Chronic systolic HF/ICM/CAD - history of CAD with cath 2015 with severe 3 vessel CAD s/p CABG 10/2013 - new diagnosis of CHF during 06/2018 admission - 06/2018 echo LVEF 25-30%, restrictive diastolic filling, moderate RV dysfunction - 06/2018 RHC/LHC: LM patent, ostial LAD 85%, ramus 60%, ostial LCX 90%, OM1 100%, RCA distal occlusion. LIMA-LAD patent, occluded SVG-ramus, SVG-OM2, SVG-RPDA.  - CI 2.7, mean PA 42, PCWP 29.   - anatomy not amenable to PCI or repeat CABG. Recs for medical therapy per interventional cards - he was diuresed during admission, reported discharge weight 216 lbs    - since discharge has felt well, despite a phone call from his son we received  reporting fatigue, patient himself denies any issues.  - no chest pain, no SOB or DOE. Stable weights around 215-216 - compliant with meds.    2. Posotp afib - discharge summary reports history of PAF, from chart review this was actually postop afib that occurred after CABG - no noted recurrence   3. Substance abuse - cocaine + during recent admission     The patient does not have symptoms concerning for COVID-19 infection (fever, chills, cough, or new shortness of breath).    Past Medical History:  Diagnosis Date  . Anxiety   . COPD (chronic obstructive pulmonary disease) ([place])   . Depression   . Diabetes mellitus   . GERD (gastroesophageal reflux disease)   . High cholesterol   . Hypertension   . Insomnia   . Myocardial infarction ([place]) 11/04/13  . Neuropathy    Past Surgical History:  Procedure Laterality Date  . CARDIOVERSION N/A 11/06/2013   Procedure: CARDIOVERSION;  Surgeon: Thayer Headings, MD;  Location: [place];  Service: Cardiovascular;  Laterality: N/A;  . CARPAL TUNNEL RELEASE  04/2015  . COLONOSCOPY N/A 05/11/2014   Procedure: COLONOSCOPY;  Surgeon: Rogene Houston, MD;  [facility];  Service: Endoscopy;  Laterality: N/A;  930  . CORONARY ARTERY BYPASS GRAFT N/A 11/09/2013   Procedure: CORONARY ARTERY BYPASS GRAFTING (CABG);  Surgeon: Melrose Nakayama, MD;  Location: [place];  Service: Open Heart Surgery;  Laterality: N/A;  . INTRAOPERATIVE TRANSESOPHAGEAL ECHOCARDIOGRAM N/A 11/09/2013   Procedure: INTRAOPERATIVE TRANSESOPHAGEAL ECHOCARDIOGRAM;  Surgeon: Remo Lipps  Chaya Jan, MD;  Location: [place];  Service: Open Heart Surgery;  Laterality: N/A;  . LEFT HEART CATHETERIZATION WITH CORONARY ANGIOGRAM N/A 11/04/2013   Procedure: LEFT HEART CATHETERIZATION WITH CORONARY ANGIOGRAM;  Surgeon: Troy Sine, MD;  Location: [facility] CATH LAB;  Service: Cardiovascular;  Laterality: N/A;  . NO PAST SURGERIES    . RIGHT/LEFT HEART CATH AND CORONARY/GRAFT ANGIOGRAPHY  N/A 07/14/2018   Procedure: RIGHT/LEFT HEART CATH AND CORONARY/GRAFT ANGIOGRAPHY;  Surgeon: Wellington Hampshire, MD;  Location: [place] CV LAB;  Service: Cardiovascular;  Laterality: N/A;     Current Meds  Medication Sig  . aspirin 81 MG tablet Take 81 mg by mouth daily.  Marland Kitchen atorvastatin (LIPITOR) 80 MG tablet Take 1 tablet (80 mg total) by mouth daily at 6 PM.  . carvedilol (COREG) 6.25 MG tablet Take 2 tablets (12.5 mg total) by mouth 2 (two) times daily.  . citalopram (CELEXA) 20 MG tablet Take 1 tablet (20 mg total) by mouth daily.  . furosemide (LASIX) 20 MG tablet Take 1 tablet (20 mg total) by mouth every morning.  . gabapentin (NEURONTIN) 300 MG capsule Take 3 capsules (900 mg total) by mouth 3 (three) times daily. Slowly increase as discussed.  . insulin aspart protamine- aspart (NOVOLOG MIX 70/30) (70-30) 100 UNIT/ML injection Inject 0.15-0.3 mLs (15-30 Units total) into the skin 3 (three) times daily. (Patient taking differently: Inject 20 Units into the skin 3 (three) times daily. )  . losartan (COZAAR) 50 MG tablet Take 1 tablet (50 mg total) by mouth daily.  . metFORMIN (GLUCOPHAGE) 1000 MG tablet Take 1 tablet (1,000 mg total) by mouth 2 (two) times daily with a meal.  . oxyCODONE-acetaminophen (PERCOCET) 10-325 MG tablet Take 1 tablet by mouth every 8 (eight) hours as needed for pain. CHRONIC PAIN  . pantoprazole (PROTONIX) 40 MG tablet Take 1 tablet (40 mg total) by mouth daily.  Marland Kitchen rOPINIRole (REQUIP) 0.5 MG tablet Take 1-2 tablets (0.5-1 mg total) by mouth at bedtime.  Marland Kitchen spironolactone (ALDACTONE) 25 MG tablet Take 0.5 tablets (12.5 mg total) by mouth daily.     Allergies:   Patient has no known allergies.   Social History   Tobacco Use  . Smoking status: Former Smoker    Packs/day: 0.50    Types: Cigarettes    Last attempt to quit: 07/16/2018    Years since quitting: 0.0  . Smokeless tobacco: Former Systems developer    Quit date: 11/04/2013  . Tobacco comment: reports quitting  07/16/2018  Substance Use Topics  . Alcohol use: No    Alcohol/week: 2.0 standard drinks    Types: 2 Cans of beer per week  . Drug use: No    Comment: cocaine over 3 years ago     Family Hx: The patient's family history includes Diabetes in his brother; Hypertension in his brother and paternal grandfather. There is no history of Neuropathy.  ROS:   Please see the history of present illness.     All other systems reviewed and are negative.   Prior CV studies:   The following studies were reviewed today:  06/2018 echo IMPRESSIONS    1. The left ventricle has severely reduced systolic function, with an ejection fraction of 25-30%. The cavity size was mildly dilated. There is mildly increased left ventricular wall thickness. Left ventricular diastolic Doppler parameters are  consistent with restrictive filling. Elevated mean left atrial pressure.  2. The right ventricle has moderately reduced systolic function. The cavity was normal.  There is no increase in right ventricular wall thickness.  3. Left atrial size was severely dilated.  4. Right atrial size was mildly dilated.  5. No evidence of mitral valve stenosis.  6. The aortic valve has an indeterminate number of cusps. No stenosis of the aortic valve.  7. The aortic root is normal in size and structure.  8. Pulmonary hypertension is indeterminant, inadequate TR jet.  9. The inferior vena cava was dilated in size with >50% respiratory variability.  06/2018 cath  Ost Ramus to Ramus lesion is 60% stenosed.  Ost Cx to Prox Cx lesion is 90% stenosed.  Mid Cx lesion is 80% stenosed.  Ost 1st Mrg lesion is 100% stenosed.  LIMA and is normal in caliber.  Prox RCA to Mid RCA lesion is 30% stenosed.  SVG.  Origin to Prox Graft lesion is 100% stenosed.  SVG.  Origin to Prox Graft lesion is 100% stenosed.  SVG.  Origin lesion is 100% stenosed.  Prox LAD lesion is 70% stenosed.  Ost LAD to Prox LAD lesion is 85%  stenosed.  Dist RCA lesion is 100% stenosed.   1.  Severe underlying three-vessel coronary artery disease with occluded vein grafts (SVG to OM, SVG to diagonal/ramus and SVG to right PDA/PL).  The only patent graft is LIMA to LAD.  The native coronary arteries are heavily calcified and diffusely diseased. 2.  Left ventricular angiography was not performed.  EF was severely reduced by echo at 20%. 3.  Right heart catheterization showed moderately elevated filling pressures, moderate pulmonary hypertension and normal cardiac output.  Recommendations: Unfortunately, all vein grafts are occluded.  The RCA is occluded distally with heavy calcifications.  The left circumflex is heavily calcified and diffusely diseased.  Both of these are not suitable for PCI.  Also the distal branches are relatively small and likely not good targets for redo CABG.  His best option is aggressive medical therapy. Regarding heart failure, he continues to be moderately volume overloaded with a wedge pressure of 29 mmHg.  I resumed IV furosemide.  I added losartan.  Consider transitioning to [facility] and adding spironolactone.   Labs/Other Tests and Data Reviewed:    EKG:  No ECG reviewed.  Recent Labs: 03/09/2018: ALT 6 07/12/2018: B Natriuretic Peptide 1,082.0 07/13/2018: Magnesium 2.3; Platelets 277 07/14/2018: Hemoglobin 15.0 07/16/2018: BUN 16; Creatinine, Ser 1.00; Potassium 3.4; Sodium 135   Recent Lipid Panel Lab Results  Component Value Date/Time   CHOL 147 03/09/2018 08:59 AM   TRIG 113 03/09/2018 08:59 AM   HDL 40 03/09/2018 08:59 AM   CHOLHDL 3.7 03/09/2018 08:59 AM   CHOLHDL 3.4 11/06/2013 03:00 AM   LDLCALC 84 03/09/2018 08:59 AM    Wt Readings from Last 3 Encounters:  07/26/18 215 lb (97.5 kg)  07/16/18 216 lb 9.6 oz (98.2 kg)  03/09/18 221 lb 9.6 oz (100.5 kg)     Objective:    Vital Signs:  BP (!) 126/99   Ht 5\' 9"  (1.753 m)   Wt 215 lb (97.5 kg)   BMI 31.75 kg/m   Today's Vitals    07/26/18 0919  BP: (!) 126/99  Weight: 215 lb (97.5 kg)  Height: 5\' 9"  (1.753 m)   Body mass index is 31.75 kg/m.  Normal affect. Normal speech pattern and tone. Comfortable, no apparent distress. No audible signs of SOB or wheezing.     ASSESSMENT & PLAN:    1. 1. Chronic systolic HF/CAD/ICM - no current symptoms, weights are  stable since discharge - we will change losartan to entresto 49/51mg  bid. Check BMET/Mg in 1 week - titrate meds as tolerated, repeat echo 3-6 months to decide on ICD - CAD by cath without revasc targets    COVID-19 Education: The signs and symptoms of COVID-19 were discussed with the patient and how to seek care for testing (follow up with PCP or arrange E-visit).  The importance of social distancing was discussed today.  Time:   Today, I have spent 18 minutes with the patient with telehealth technology discussing the above problems.     Medication Adjustments/Labs and Tests Ordered: Current medicines are reviewed at length with the patient today.  Concerns regarding medicines are outlined above.   Tests Ordered: No orders of the defined types were placed in this encounter.   Medication Changes: No orders of the defined types were placed in this encounter.   Disposition:  Follow up 3 weeks  Signed, Carlyle Dolly, MD  07/26/2018 9:27 AM    [place]

## 2018-07-30 ENCOUNTER — Ambulatory Visit: Payer: Medicare HMO | Admitting: Cardiology

## 2018-07-30 ENCOUNTER — Telehealth: Payer: Self-pay | Admitting: *Deleted

## 2018-07-30 NOTE — Telephone Encounter (Signed)
[employer] approved Entresto through 02/17/2019 - letter sent for scanning

## 2018-08-06 DIAGNOSIS — I10 Essential (primary) hypertension: Secondary | ICD-10-CM | POA: Diagnosis not present

## 2018-08-06 DIAGNOSIS — I5022 Chronic systolic (congestive) heart failure: Secondary | ICD-10-CM | POA: Diagnosis not present

## 2018-08-09 ENCOUNTER — Encounter: Payer: Self-pay | Admitting: Cardiology

## 2018-08-11 ENCOUNTER — Telehealth: Payer: Self-pay | Admitting: *Deleted

## 2018-08-11 NOTE — Telephone Encounter (Signed)
Patient informed. 

## 2018-08-11 NOTE — Telephone Encounter (Signed)
-----   Message from Arnoldo Lenis, MD sent at 08/11/2018 11:25 AM EDT ----- Normal labs  J BrancH MD

## 2018-08-12 ENCOUNTER — Encounter (HOSPITAL_COMMUNITY): Payer: Self-pay | Admitting: Emergency Medicine

## 2018-08-12 ENCOUNTER — Other Ambulatory Visit: Payer: Self-pay

## 2018-08-12 ENCOUNTER — Emergency Department (HOSPITAL_COMMUNITY)
Admission: EM | Admit: 2018-08-12 | Discharge: 2018-08-12 | Disposition: A | Discharge status: Home / Self Care | Payer: Medicare HMO | Attending: Emergency Medicine | Admitting: Emergency Medicine

## 2018-08-12 DIAGNOSIS — E119 Type 2 diabetes mellitus without complications: Secondary | ICD-10-CM | POA: Diagnosis not present

## 2018-08-12 DIAGNOSIS — Z794 Long term (current) use of insulin: Secondary | ICD-10-CM | POA: Insufficient documentation

## 2018-08-12 DIAGNOSIS — S8992XA Unspecified injury of left lower leg, initial encounter: Secondary | ICD-10-CM | POA: Diagnosis present

## 2018-08-12 DIAGNOSIS — Y999 Unspecified external cause status: Secondary | ICD-10-CM | POA: Diagnosis not present

## 2018-08-12 DIAGNOSIS — Y939 Activity, unspecified: Secondary | ICD-10-CM | POA: Insufficient documentation

## 2018-08-12 DIAGNOSIS — I252 Old myocardial infarction: Secondary | ICD-10-CM | POA: Diagnosis not present

## 2018-08-12 DIAGNOSIS — Z79899 Other long term (current) drug therapy: Secondary | ICD-10-CM | POA: Insufficient documentation

## 2018-08-12 DIAGNOSIS — J449 Chronic obstructive pulmonary disease, unspecified: Secondary | ICD-10-CM | POA: Diagnosis not present

## 2018-08-12 DIAGNOSIS — Y929 Unspecified place or not applicable: Secondary | ICD-10-CM | POA: Diagnosis not present

## 2018-08-12 DIAGNOSIS — I110 Hypertensive heart disease with heart failure: Secondary | ICD-10-CM | POA: Insufficient documentation

## 2018-08-12 DIAGNOSIS — S80212A Abrasion, left knee, initial encounter: Secondary | ICD-10-CM | POA: Diagnosis not present

## 2018-08-12 DIAGNOSIS — I5042 Chronic combined systolic (congestive) and diastolic (congestive) heart failure: Secondary | ICD-10-CM | POA: Insufficient documentation

## 2018-08-12 DIAGNOSIS — S80211A Abrasion, right knee, initial encounter: Secondary | ICD-10-CM | POA: Diagnosis not present

## 2018-08-12 DIAGNOSIS — M25561 Pain in right knee: Secondary | ICD-10-CM

## 2018-08-12 DIAGNOSIS — Z87891 Personal history of nicotine dependence: Secondary | ICD-10-CM | POA: Insufficient documentation

## 2018-08-12 DIAGNOSIS — Z951 Presence of aortocoronary bypass graft: Secondary | ICD-10-CM | POA: Diagnosis not present

## 2018-08-12 DIAGNOSIS — Z7982 Long term (current) use of aspirin: Secondary | ICD-10-CM | POA: Insufficient documentation

## 2018-08-12 DIAGNOSIS — M25562 Pain in left knee: Secondary | ICD-10-CM | POA: Diagnosis not present

## 2018-08-12 MED ORDER — LIDOCAINE 5 % EX PTCH
2.0000 | MEDICATED_PATCH | Freq: Once | CUTANEOUS | Status: DC
  Filled 2018-08-12: qty 2

## 2018-08-12 NOTE — Discharge Instructions (Signed)
You may use lidocaine pain patches, or the Bactine with lidocaine spray to assist with the discomfort of your knees.  Use extra strength Tylenol every 4 hours as needed for discomfort as well.  Please see Ms. Shane Frye for additional follow-up concerning your discomfort.

## 2018-08-12 NOTE — ED Triage Notes (Signed)
During assessment, pt c/o right shoulder pain but refuses treatment for shoulder.
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
## 2018-08-17 ENCOUNTER — Other Ambulatory Visit: Payer: Self-pay | Admitting: *Deleted

## 2018-08-17 ENCOUNTER — Ambulatory Visit (INDEPENDENT_AMBULATORY_CARE_PROVIDER_SITE_OTHER): Payer: Medicare HMO | Admitting: Cardiology

## 2018-08-17 ENCOUNTER — Encounter: Payer: Self-pay | Admitting: Cardiology

## 2018-08-17 VITALS — BP 94/62 | HR 75 | Ht 69.0 in | Wt 222.6 lb

## 2018-08-17 DIAGNOSIS — I10 Essential (primary) hypertension: Secondary | ICD-10-CM

## 2018-08-17 DIAGNOSIS — I5022 Chronic systolic (congestive) heart failure: Secondary | ICD-10-CM

## 2018-08-17 DIAGNOSIS — I2510 Atherosclerotic heart disease of native coronary artery without angina pectoris: Secondary | ICD-10-CM | POA: Diagnosis not present

## 2018-08-17 MED ORDER — CARVEDILOL 6.25 MG PO TABS: 12.5000 mg | ORAL_TABLET | Freq: Two times a day (BID) | ORAL | 1 refills | Status: DC

## 2018-08-17 NOTE — Progress Notes (Signed)
Clinical Summary Mr. Branscom is a 53 y.o.male seen today for follow up of the following medical problems.   1. Chronic systolic HF/ICM/CAD - history of CAD with cath 2015 with severe 3 vessel CAD s/p CABG 10/2013 - new diagnosis of CHF during 06/2018 admission - 06/2018 echo LVEF 25-30%, restrictive diastolic filling, moderate RV dysfunction - 06/2018 RHC/LHC: LM patent, ostial LAD 85%, ramus 60%, ostial LCX 90%, OM1 100%, RCA distal occlusion. LIMA-LAD patent, occluded SVG-ramus, SVG-OM2, SVG-RPDA.  - CI 2.7, mean PA 42, PCWP 29.   - anatomy not amenable to PCI or repeat CABG. Recs for medical therapy per interventional cards - he was diuresed during admission, reported discharge weight 216 lbs   - last visit we increased entresto to 49/51mg  bid.  - no recent SOB/DOE. No chest pain. No recent edema - home weights 218-220. Mild orthostatic symptoms at times.     2. Posotp afib - discharge summary reports history of PAF, from chart review this was actually postop afib that occurred after CABG in 2015.  - no noted recurrence. Has not been committed to long term anticoag.    3. Substance abuse - cocaine + during recent admission   Past Medical History:  Diagnosis Date  . Anxiety   . COPD (chronic obstructive pulmonary disease) ([place])   . Depression   . Diabetes mellitus   . GERD (gastroesophageal reflux disease)   . High cholesterol   . Hypertension   . Insomnia   . Myocardial infarction ([place]) 11/04/13  . Neuropathy      No Known Allergies   Current Outpatient Medications  Medication Sig Dispense Refill  . albuterol (VENTOLIN HFA) 108 (90 Base) MCG/ACT inhaler Inhale 2 puffs into the lungs every 6 (six) hours as needed for wheezing or shortness of breath. 1 Inhaler 5  . aspirin 81 MG tablet Take 81 mg by mouth daily.    Marland Kitchen atorvastatin (LIPITOR) 80 MG tablet Take 1 tablet (80 mg total) by mouth daily at 6 PM. 30 tablet 2  . carvedilol (COREG) 6.25 MG tablet  Take 2 tablets (12.5 mg total) by mouth 2 (two) times daily. 60 tablet 2  . citalopram (CELEXA) 20 MG tablet Take 1 tablet (20 mg total) by mouth daily. 90 tablet 3  . furosemide (LASIX) 20 MG tablet Take 1 tablet (20 mg total) by mouth every morning. 90 tablet 2  . gabapentin (NEURONTIN) 300 MG capsule Take 3 capsules (900 mg total) by mouth 3 (three) times daily. Slowly increase as discussed. 810 capsule 3  . glucose blood test strip Use as instructed 100 each 12  . insulin aspart protamine- aspart (NOVOLOG MIX 70/30) (70-30) 100 UNIT/ML injection Inject 0.15-0.3 mLs (15-30 Units total) into the skin 3 (three) times daily. (Patient taking differently: Inject 20 Units into the skin 3 (three) times daily. ) 30 mL 5  . Insulin Syringe-Needle U-100 (RELION INSULIN SYR 0.3ML/31G) 31G X 5/16" 0.3 ML MISC USE ONE SYRINGE EACH TIME THREE TIMES DAILY 100 each 5  . metFORMIN (GLUCOPHAGE) 1000 MG tablet Take 1 tablet (1,000 mg total) by mouth 2 (two) times daily with a meal. 180 tablet 3  . oxyCODONE-acetaminophen (PERCOCET) 10-325 MG tablet Take 1 tablet by mouth every 8 (eight) hours as needed for pain. CHRONIC PAIN 90 tablet 0  . pantoprazole (PROTONIX) 40 MG tablet Take 1 tablet (40 mg total) by mouth daily. 90 tablet 3  . rOPINIRole (REQUIP) 0.5 MG tablet Take 1-2 tablets (0.5-1  mg total) by mouth at bedtime. 60 tablet 5  . sacubitril-valsartan (ENTRESTO) 49-51 MG Take 1 tablet by mouth 2 (two) times daily. 60 tablet 3  . spironolactone (ALDACTONE) 25 MG tablet Take 0.5 tablets (12.5 mg total) by mouth daily. 30 tablet 2   No current facility-administered medications for this visit.      Past Surgical History:  Procedure Laterality Date  . CARDIOVERSION N/A 11/06/2013   Procedure: CARDIOVERSION;  Surgeon: Thayer Headings, MD;  Location: [place] [facility];  Service: Cardiovascular;  Laterality: N/A;  . CARPAL TUNNEL RELEASE  04/2015  . COLONOSCOPY N/A 05/11/2014   Procedure: COLONOSCOPY;  Surgeon: Rogene Houston, MD;  [facility];  Service: Endoscopy;  Laterality: N/A;  930  . CORONARY ARTERY BYPASS GRAFT N/A 11/09/2013   Procedure: CORONARY ARTERY BYPASS GRAFTING (CABG);  Surgeon: Melrose Nakayama, MD;  Location: [place];  Service: Open Heart Surgery;  Laterality: N/A;  . INTRAOPERATIVE TRANSESOPHAGEAL ECHOCARDIOGRAM N/A 11/09/2013   Procedure: INTRAOPERATIVE TRANSESOPHAGEAL ECHOCARDIOGRAM;  Surgeon: Melrose Nakayama, MD;  Location: [place];  Service: Open Heart Surgery;  Laterality: N/A;  . LEFT HEART CATHETERIZATION WITH CORONARY ANGIOGRAM N/A 11/04/2013   Procedure: LEFT HEART CATHETERIZATION WITH CORONARY ANGIOGRAM;  Surgeon: Troy Sine, MD;  Location: [facility] CATH LAB;  Service: Cardiovascular;  Laterality: N/A;  . NO PAST SURGERIES    . RIGHT/LEFT HEART CATH AND CORONARY/GRAFT ANGIOGRAPHY N/A 07/14/2018   Procedure: RIGHT/LEFT HEART CATH AND CORONARY/GRAFT ANGIOGRAPHY;  Surgeon: Wellington Hampshire, MD;  Location: [facility] CV LAB;  Service: Cardiovascular;  Laterality: N/A;     No Known Allergies    Family History  Problem Relation Age of Onset  . Hypertension Paternal Grandfather   . Hypertension Brother   . Diabetes Brother   . Neuropathy Neg Hx      Social History Mr. Gendron reports that he quit smoking about 4 weeks ago. His smoking use included cigarettes. He smoked 0.50 packs per day. He quit smokeless tobacco use about 4 years ago. Mr. Tetreault reports no history of alcohol use.   Review of Systems CONSTITUTIONAL: No weight loss, fever, chills, weakness or fatigue.  HEENT: Eyes: No visual loss, blurred vision, double vision or yellow sclerae.No hearing loss, sneezing, congestion, runny nose or sore throat.  SKIN: No rash or itching.  CARDIOVASCULAR: per hpi RESPIRATORY: No shortness of breath, cough or sputum.  GASTROINTESTINAL: No anorexia, nausea, vomiting or diarrhea. No abdominal pain or blood.  GENITOURINARY: No burning on urination, no polyuria  NEUROLOGICAL: No headache, dizziness, syncope, paralysis, ataxia, numbness or tingling in the extremities. No change in bowel or bladder control.  MUSCULOSKELETAL: No muscle, back pain, joint pain or stiffness.  LYMPHATICS: No enlarged nodes. No history of splenectomy.  PSYCHIATRIC: No history of depression or anxiety.  ENDOCRINOLOGIC: No reports of sweating, cold or heat intolerance. No polyuria or polydipsia.  Marland Kitchen   Physical Examination Today's Vitals   08/17/18 0921  BP: 94/62  Pulse: 75  SpO2: 98%  Weight: 222 lb 9.6 oz (101 kg)  Height: 5\' 9"  (1.753 m)   Body mass index is 32.87 kg/m.  Gen: resting comfortably, no acute distress HEENT: no scleral icterus, pupils equal round and reactive, no palptable cervical adenopathy,  CV: RRR, no m/r/g, no jvd Resp: Clear to auscultation bilaterally GI: abdomen is soft, non-tender, non-distended, normal bowel sounds, no hepatosplenomegaly MSK: extremities are warm, no edema.  Skin: warm, no rash Neuro:  no focal deficits Psych: appropriate affect  Diagnostic Studies 06/2018 echo IMPRESSIONS   1. The left ventricle has severely reduced systolic function, with an ejection fraction of 25-30%. The cavity size was mildly dilated. There is mildly increased left ventricular wall thickness. Left ventricular diastolic Doppler parameters are  consistent with restrictive filling. Elevated mean left atrial pressure. 2. The right ventricle has moderately reduced systolic function. The cavity was normal. There is no increase in right ventricular wall thickness. 3. Left atrial size was severely dilated. 4. Right atrial size was mildly dilated. 5. No evidence of mitral valve stenosis. 6. The aortic valve has an indeterminate number of cusps. No stenosis of the aortic valve. 7. The aortic root is normal in size and structure. 8. Pulmonary hypertension is indeterminant, inadequate TR jet. 9. The inferior vena cava was dilated in size  with >50% respiratory variability.  06/2018 cath  Ost Ramus to Ramus lesion is 60% stenosed.  Ost Cx to Prox Cx lesion is 90% stenosed.  Mid Cx lesion is 80% stenosed.  Ost 1st Mrg lesion is 100% stenosed.  LIMA and is normal in caliber.  Prox RCA to Mid RCA lesion is 30% stenosed.  SVG.  Origin to Prox Graft lesion is 100% stenosed.  SVG.  Origin to Prox Graft lesion is 100% stenosed.  SVG.  Origin lesion is 100% stenosed.  Prox LAD lesion is 70% stenosed.  Ost LAD to Prox LAD lesion is 85% stenosed.  Dist RCA lesion is 100% stenosed.  1. Severe underlying three-vessel coronary artery disease with occluded vein grafts (SVG to OM, SVG to diagonal/ramus and SVG to right PDA/PL). The only patent graft is LIMA to LAD. The native coronary arteries are heavily calcified and diffusely diseased. 2. Left ventricular angiography was not performed. EF was severely reduced by echo at 20%. 3. Right heart catheterization showed moderately elevated filling pressures, moderate pulmonary hypertension and normal cardiac output.  Recommendations: Unfortunately, all vein grafts are occluded. The RCA is occluded distally with heavy calcifications. The left circumflex is heavily calcified and diffusely diseased. Both of these are not suitable for PCI. Also the distal branches are relatively small and likely not good targets for redo CABG. His best option is aggressive medical therapy. Regarding heart failure, he continues to be moderately volume overloaded with a wedge pressure of 29 mmHg. I resumed IV furosemide. I added losartan. Consider transitioning to [facility] and adding spironolactone.    Assessment and Plan   1. Chronic systolic HF/CAD/ICM - no symptoms - soft bp's today but only mild orthostatic symptoms at times. Continue current meds, no further titration at this time - repeat echo around September, pending LVEF at that time may warrant ICD consideration   F/u  6 weeks   Arnoldo Lenis, M.D.,

## 2018-08-17 NOTE — Patient Instructions (Addendum)
Your physician recommends that you schedule a follow-up appointment in: 6 WEEKS WITH DR BRANCH  Your physician recommends that you continue on your current medications as directed. Please refer to the Current Medication list given to you today.  Thank you for choosing [facility]!!    

## 2018-08-31 ENCOUNTER — Telehealth: Payer: Self-pay | Admitting: Cardiology

## 2018-08-31 NOTE — Telephone Encounter (Signed)
Virtual Visit Pre-Appointment Phone Call  "(Name), I am calling you today to discuss your upcoming appointment. We are currently trying to limit exposure to the virus that causes COVID-19 by seeing patients at home rather than in the office."  1. "What is the BEST phone number to call the day of the visit?" -   [phone]  2. Do you have or have access to (through a family member/friend) a smartphone with video capability that we can use for your visit?" a. If yes - list this number in appt notes as cell (if different from BEST phone #) and list the appointment type as a VIDEO visit in appointment notes b. If no - list the appointment type as a PHONE visit in appointment notes  3. Confirm consent - "In the setting of the current Covid19 crisis, you are scheduled for a (phone or video) visit with your provider on (date) at (time).  Just as we do with many in-office visits, in order for you to participate in this visit, we must obtain consent.  If you'd like, I can send this to your mychart (if signed up) or email for you to review.  Otherwise, I can obtain your verbal consent now.  All virtual visits are billed to your insurance company just like a normal visit would be.  By agreeing to a virtual visit, we'd like you to understand that the technology does not allow for your provider to perform an examination, and thus may limit your provider's ability to fully assess your condition. If your provider identifies any concerns that need to be evaluated in person, we will make arrangements to do so.  Finally, though the technology is pretty good, we cannot assure that it will always work on either your or our end, and in the setting of a video visit, we may have to convert it to a phone-only visit.  In either situation, we cannot ensure that we have a secure connection.  Are you willing to proceed?" STAFF: Did the patient verbally acknowledge consent to telehealth visit? Document YES/NO here: YES    4. Advise patient to be prepared - "Two hours prior to your appointment, go ahead and check your blood pressure, pulse, oxygen saturation, and your weight (if you have the equipment to check those) and write them all down. When your visit starts, your provider will ask you for this information. If you have an Apple Watch or Kardia device, please plan to have heart rate information ready on the day of your appointment. Please have a pen and paper handy nearby the day of the visit as well."  5. Give patient instructions for MyChart download to smartphone OR [email or web address] as below if video visit (depending on what platform provider is using)  6. Inform patient they will receive a phone call 15 minutes prior to their appointment time (may be from unknown caller ID) so they should be prepared to answer    TELEPHONE CALL NOTE  Shane Frye has been deemed a candidate for a follow-up tele-health visit to limit community exposure during the Covid-19 pandemic. I spoke with the patient via phone to ensure availability of phone/video source, confirm preferred email & phone number, and discuss instructions and expectations.  I reminded Shane Frye to be prepared with any vital sign and/or heart rhythm information that could potentially be obtained via home monitoring, at the time of his visit. I reminded Shane Frye to expect a phone call prior to his  visit.  Lynnda Child Slaughter 08/31/2018 3:02 PM   INSTRUCTIONS FOR DOWNLOADING THE MYCHART APP TO SMARTPHONE  - The patient must first make sure to have activated MyChart and know their login information - If Apple, go to [employer] and type in MyChart in the search bar and download the app. If Android, ask patient to go to [employer] and type in [place] in the search bar and download the app. The app is free but as with any other app downloads, their phone may require them to verify saved payment information or Apple/Android password.  - The  patient will need to then log into the app with their MyChart username and password, and select [facility] as their healthcare provider to link the account. When it is time for your visit, go to the MyChart app, find appointments, and click Begin Video Visit. Be sure to Select Allow for your device to access the Microphone and Camera for your visit. You will then be connected, and your provider will be with you shortly.  **If they have any issues connecting, or need assistance please contact MyChart service desk [phone])**  **If using a computer, in order to ensure the best quality for their visit they will need to use either of the following Internet Browsers: [employer], or Google Chrome**  IF USING DOXIMITY or DOXY.ME - The patient will receive a link just prior to their visit by text.     FULL LENGTH CONSENT FOR TELE-HEALTH VISIT   I hereby voluntarily request, consent and authorize [facility] and its employed or contracted physicians, physician assistants, nurse practitioners or other licensed health care professionals (the Practitioner), to provide me with telemedicine health care services (the Services") as deemed necessary by the treating Practitioner. I acknowledge and consent to receive the Services by the Practitioner via telemedicine. I understand that the telemedicine visit will involve communicating with the Practitioner through live audiovisual communication technology and the disclosure of certain medical information by electronic transmission. I acknowledge that I have been given the opportunity to request an in-person assessment or other available alternative prior to the telemedicine visit and am voluntarily participating in the telemedicine visit.  I understand that I have the right to withhold or withdraw my consent to the use of telemedicine in the course of my care at any time, without affecting my right to future care or treatment, and that the  Practitioner or I may terminate the telemedicine visit at any time. I understand that I have the right to inspect all information obtained and/or recorded in the course of the telemedicine visit and may receive copies of available information for a reasonable fee.  I understand that some of the potential risks of receiving the Services via telemedicine include:   Delay or interruption in medical evaluation due to technological equipment failure or disruption;  Information transmitted may not be sufficient (e.g. poor resolution of images) to allow for appropriate medical decision making by the Practitioner; and/or   In rare instances, security protocols could fail, causing a breach of personal health information.  Furthermore, I acknowledge that it is my responsibility to provide information about my medical history, conditions and care that is complete and accurate to the best of my ability. I acknowledge that Practitioner's advice, recommendations, and/or decision may be based on factors not within their control, such as incomplete or inaccurate data provided by me or distortions of diagnostic images or specimens that may result from electronic transmissions. I understand  that the practice of medicine is not an exact science and that Practitioner makes no warranties or guarantees regarding treatment outcomes. I acknowledge that I will receive a copy of this consent concurrently upon execution via email to the email address I last provided but may also request a printed copy by calling the office of [facility].    I understand that my insurance will be billed for this visit.   I have read or had this consent read to me.  I understand the contents of this consent, which adequately explains the benefits and risks of the Services being provided via telemedicine.   I have been provided ample opportunity to ask questions regarding this consent and the Services and have had my questions answered to my  satisfaction.  I give my informed consent for the services to be provided through the use of telemedicine in my medical care  By participating in this telemedicine visit I agree to the above.

## 2018-09-22 ENCOUNTER — Telehealth: Payer: Medicare HMO | Admitting: Cardiology

## 2018-09-22 NOTE — Progress Notes (Unsigned)
{Choose 1 Note Type (Telehealth Visit or Telephone Visit):[id number]}   Date:  09/22/2018   ID:  Shane Frye, DOB Apr 10, 1965, MRN [id number]  {Patient Location:[phone]::"Home"} {Provider Location:[phone]::"Home"}  PCP:  Terald Sleeper, PA-C  Cardiologist:  Carlyle Dolly, MD *** Electrophysiologist:  None   Evaluation Performed:  {Choose Visit Type:[phone]::"Follow-Up Visit"}  Chief Complaint:  ***  History of Present Illness:    Shane Frye is a 53 y.o. male with ***  1. Chronic systolic HF/ICM/CAD -history of CAD with cath 2015 with severe 3 vessel CAD s/p CABG 10/2013 - new diagnosis of CHF during 06/2018 admission - 06/2018 echo LVEF 25-30%, restrictive diastolic filling, moderate RV dysfunction - 06/2018 RHC/LHC: LM patent, ostial LAD 85%, ramus 60%, ostial LCX 90%, OM1 100%, RCA distal occlusion. LIMA-LAD patent, occluded SVG-ramus, SVG-OM2, SVG-RPDA.  - CI 2.7, mean PA 42, PCWP 29.   - anatomy not amenable to PCI or repeat CABG. Recs for medical therapy per interventional cards - he was diuresed during admission, reported discharge weight 216 lbs   - last visit we increased entresto to 49/51mg  bid.  - no recent SOB/DOE. No chest pain. No recent edema - home weights 218-220. Mild orthostatic symptoms at times.     2. Posotp afib - recent discharge summary reports history of PAF, from chart review this was actually postop afib that occurred after CABG in 2015.  - no noted recurrence. Has not been committed to long term anticoag.    3. Substance abuse - cocaine + during recent admission   The patient {does/does not:200015} have symptoms concerning for COVID-19 infection (fever, chills, cough, or new shortness of breath).    Past Medical History:  Diagnosis Date  . Anxiety   . COPD (chronic obstructive pulmonary disease) ([place])   . Depression   . Diabetes mellitus   . GERD (gastroesophageal reflux disease)   . High cholesterol   .  Hypertension   . Insomnia   . Myocardial infarction ([place]) 11/04/13  . Neuropathy    Past Surgical History:  Procedure Laterality Date  . CARDIOVERSION N/A 11/06/2013   Procedure: CARDIOVERSION;  Surgeon: Thayer Headings, MD;  Location: [facility];  Service: Cardiovascular;  Laterality: N/A;  . CARPAL TUNNEL RELEASE  04/2015  . COLONOSCOPY N/A 05/11/2014   Procedure: COLONOSCOPY;  Surgeon: Rogene Houston, MD;  [facility];  Service: Endoscopy;  Laterality: N/A;  930  . CORONARY ARTERY BYPASS GRAFT N/A 11/09/2013   Procedure: CORONARY ARTERY BYPASS GRAFTING (CABG);  Surgeon: Mel[place] Nakayama, MD;  Location: [place];  Service: Open Heart Surgery;  Laterality: N/A;  . INTRAOPERATIVE TRANSESOPHAGEAL ECHOCARDIOGRAM N/A 11/09/2013   Procedure: INTRAOPERATIVE TRANSESOPHAGEAL ECHOCARDIOGRAM;  Surgeon: [place] Nakayama, MD;  Location: [place];  Service: Open Heart Surgery;  Laterality: N/A;  . LEFT HEART CATHETERIZATION WITH CORONARY ANGIOGRAM N/A 11/04/2013   Procedure: LEFT HEART CATHETERIZATION WITH CORONARY ANGIOGRAM;  Surgeon: Troy Sine, MD;  Location: [facility];  Service: Cardiovascular;  Laterality: N/A;  . NO PAST SURGERIES    . RIGHT/LEFT HEART CATH AND CORONARY/GRAFT ANGIOGRAPHY N/A 07/14/2018   Procedure: RIGHT/LEFT HEART CATH AND CORONARY/GRAFT ANGIOGRAPHY;  Surgeon: [place] Hampshire, MD;  Location: [facility];  Service: Cardiovascular;  Laterality: N/A;     No outpatient medications have been marked as taking for the 09/22/18 encounter (Appointment) with Arnoldo Lenis, MD.     Allergies:   Patient has no known allergies.   Social History  Tobacco Use  . Smoking status: Former Smoker    Packs/day: 0.50    Types: Cigarettes    Quit date: 07/16/2018    Years since quitting: 0.1  . Smokeless tobacco: Former Systems developer    Quit date: 11/04/2013  . Tobacco comment: reports quitting 07/16/2018  Substance Use Topics  . Alcohol use: No    Alcohol/week: 2.0 standard  drinks    Types: 2 Cans of beer per week  . Drug use: No    Comment: cocaine over 3 years ago     Family Hx: The patient's family history includes Diabetes in his brother; Hypertension in his brother and paternal grandfather. There is no history of Neuropathy.  ROS:   Please see the history of present illness.    *** All other systems reviewed and are negative.   Prior CV studies:   The following studies were reviewed today:  06/2018 echo IMPRESSIONS   1. The left ventricle has severely reduced systolic function, with an ejection fraction of 25-30%. The cavity size was mildly dilated. There is mildly increased left ventricular wall thickness. Left ventricular diastolic Doppler parameters are  consistent with restrictive filling. Elevated mean left atrial pressure. 2. The right ventricle has moderately reduced systolic function. The cavity was normal. There is no increase in right ventricular wall thickness. 3. Left atrial size was severely dilated. 4. Right atrial size was mildly dilated. 5. No evidence of mitral valve stenosis. 6. The aortic valve has an indeterminate number of cusps. No stenosis of the aortic valve. 7. The aortic root is normal in size and structure. 8. Pulmonary hypertension is indeterminant, inadequate TR jet. 9. The inferior vena cava was dilated in size with >50% respiratory variability.  06/2018 cath  Ost Ramus to Ramus lesion is 60% stenosed.  Ost Cx to Prox Cx lesion is 90% stenosed.  Mid Cx lesion is 80% stenosed.  Ost 1st Mrg lesion is 100% stenosed.  LIMA and is normal in caliber.  Prox RCA to Mid RCA lesion is 30% stenosed.  SVG.  Origin to Prox Graft lesion is 100% stenosed.  SVG.  Origin to Prox Graft lesion is 100% stenosed.  SVG.  Origin lesion is 100% stenosed.  Prox LAD lesion is 70% stenosed.  Ost LAD to Prox LAD lesion is 85% stenosed.  Dist RCA lesion is 100% stenosed.  1. Severe underlying  three-vessel coronary artery disease with occluded vein grafts (SVG to OM, SVG to diagonal/ramus and SVG to right PDA/PL). The only patent graft is LIMA to LAD. The native coronary arteries are heavily calcified and diffusely diseased. 2. Left ventricular angiography was not performed. EF was severely reduced by echo at 20%. 3. Right heart catheterization showed moderately elevated filling pressures, moderate pulmonary hypertension and normal cardiac output.  Recommendations: Unfortunately, all vein grafts are occluded. The RCA is occluded distally with heavy calcifications. The left circumflex is heavily calcified and diffusely diseased. Both of these are not suitable for PCI. Also the distal branches are relatively small and likely not good targets for redo CABG. His best option is aggressive medical therapy. Regarding heart failure, he continues to be moderately volume overloaded with a wedge pressure of 29 mmHg. I resumed IV furosemide. I added losartan. Consider transitioning to [facility] and adding spironolactone.  Labs/Other Tests and Data Reviewed:    EKG:  {EKG/Telemetry Strips [phone]}  Recent Labs: 03/09/2018: ALT 6 07/12/2018: B Natriuretic Peptide 1,082.0 07/13/2018: Magnesium 2.3; Platelets 277 07/14/2018: Hemoglobin 15.0 07/16/2018: BUN 16; Creatinine, Ser  1.00; Potassium 3.4; Sodium 135   Recent Lipid Panel Lab Results  Component Value Date/Time   CHOL 147 03/09/2018 08:59 AM   TRIG 113 03/09/2018 08:59 AM   HDL 40 03/09/2018 08:59 AM   CHOLHDL 3.7 03/09/2018 08:59 AM   CHOLHDL 3.4 11/06/2013 03:00 AM   LDLCALC 84 03/09/2018 08:59 AM    Wt Readings from Last 3 Encounters:  08/17/18 222 lb 9.6 oz (101 kg)  08/12/18 215 lb (97.5 kg)  07/26/18 215 lb (97.5 kg)     Objective:    Vital Signs:  There were no vitals taken for this visit.   {[facility] Virtual Exam (Optional):[phone]::"VITAL SIGNS:  reviewed"}  ASSESSMENT & PLAN:    1. Chronic  systolic HF/CAD/ICM - no symptoms - soft bp's today but only mild orthostatic symptoms at times. Continue current meds, no further titration at this time - repeat echo around September, pending LVEF at that time may warrant ICD consideration   F/u 6 weeks  COVID-19 Education: The signs and symptoms of COVID-19 were discussed with the patient and how to seek care for testing (follow up with PCP or arrange E-visit).  ***The importance of social distancing was discussed today.  Time:   Today, I have spent *** minutes with the patient with telehealth technology discussing the above problems.     Medication Adjustments/Labs and Tests Ordered: Current medicines are reviewed at length with the patient today.  Concerns regarding medicines are outlined above.   Tests Ordered: No orders of the defined types were placed in this encounter.   Medication Changes: No orders of the defined types were placed in this encounter.   Follow Up:  {F/U Format:[phone]} {follow up:15908}  Signed, Carlyle Dolly, MD  09/22/2018 7:58 AM    [facility]

## 2018-09-23 ENCOUNTER — Encounter: Payer: Self-pay | Admitting: Cardiology

## 2018-10-05 ENCOUNTER — Ambulatory Visit (INDEPENDENT_AMBULATORY_CARE_PROVIDER_SITE_OTHER): Payer: Medicare HMO | Admitting: Physician Assistant

## 2018-10-05 DIAGNOSIS — G629 Polyneuropathy, unspecified: Secondary | ICD-10-CM | POA: Diagnosis not present

## 2018-10-05 DIAGNOSIS — I10 Essential (primary) hypertension: Secondary | ICD-10-CM

## 2018-10-05 DIAGNOSIS — E119 Type 2 diabetes mellitus without complications: Secondary | ICD-10-CM

## 2018-10-05 DIAGNOSIS — G2581 Restless legs syndrome: Secondary | ICD-10-CM

## 2018-10-05 DIAGNOSIS — M5116 Intervertebral disc disorders with radiculopathy, lumbar region: Secondary | ICD-10-CM | POA: Diagnosis not present

## 2018-10-05 MED ORDER — METFORMIN HCL 1000 MG PO TABS: 1000.0000 mg | ORAL_TABLET | Freq: Two times a day (BID) | ORAL | 3 refills | Status: DC

## 2018-10-05 MED ORDER — CARVEDILOL 6.25 MG PO TABS: 12.5000 mg | ORAL_TABLET | Freq: Two times a day (BID) | ORAL | 3 refills | Status: AC

## 2018-10-05 MED ORDER — OXYCODONE-ACETAMINOPHEN 10-325 MG PO TABS: 1.0000 | ORAL_TABLET | Freq: Three times a day (TID) | ORAL | 0 refills | Status: DC | PRN

## 2018-10-05 MED ORDER — PANTOPRAZOLE SODIUM 40 MG PO TBEC: 40.0000 mg | DELAYED_RELEASE_TABLET | Freq: Every day | ORAL | 3 refills | Status: AC

## 2018-10-05 MED ORDER — "INSULIN SYRINGE-NEEDLE U-100 31G X 5/16"" 0.3 ML MISC": 5 refills | Status: DC

## 2018-10-05 MED ORDER — ROPINIROLE HCL 0.5 MG PO TABS: 0.5000 mg | ORAL_TABLET | Freq: Every day | ORAL | 5 refills | Status: AC

## 2018-10-05 MED ORDER — CITALOPRAM HYDROBROMIDE 20 MG PO TABS: 20.0000 mg | ORAL_TABLET | Freq: Every day | ORAL | 3 refills | Status: AC

## 2018-10-05 MED ORDER — GABAPENTIN 300 MG PO CAPS: 900.0000 mg | ORAL_CAPSULE | Freq: Three times a day (TID) | ORAL | 3 refills | Status: DC

## 2018-10-06 ENCOUNTER — Telehealth: Payer: Medicare HMO | Admitting: Student

## 2018-10-07 ENCOUNTER — Encounter: Payer: Self-pay | Admitting: Physician Assistant

## 2018-10-07 NOTE — Progress Notes (Signed)
Telephone visit  Subjective: SW:FUXNATF on chronic conditions PCP: Terald Sleeper, PA-C TDD:UKGUR D Shane Frye is a 53 y.o. male calls for telephone consult today. Patient provides verbal consent for consult held via phone.  Patient is identified with 2 separate identifiers.  At this time the entire area is on COVID-19 social distancing and stay home orders are in place.  Patient is of higher risk and therefore we are performing this by a virtual method.  Location of patient: home Location of provider: HOME Others present for call: no  PAIN ASSESSMENT: Cause of pain- DDD 2017: Small RIGHT central L4-5 disc extrusion displaces the traversing RIGHT L5 nerve. Degenerative lumbar spine without canal stenosis. Neural foraminal narrowing L4-5 and L5-S1: Moderate on the LEFT at L5-S1. This patient returns for a 3 month recheck on narcotic use for the above named conditions  Current medications-Percocet 10/325 1 to 2 tablets 3 times a day Gabapentin 300 mg 3 times a day.  He has been able to titrate up to this.  He thinks that it is giving him some relief of this pain.  Of asked him to continue it and we will look at possibly raising up some more. Medication side effects- no Any concerns- need to lower his medication  Pain on scale of 1-10- 8 Frequency- daily What increases pain- walking What makes pain Better- rest Effects on ADL - moderate some days Any change in general medical condition- worsening COPD and CHF  Effectiveness of current meds- good Adverse reactions form pain meds-no PMP AWARE website reviewed: Yes Any suspicious activity on PMP Aware: No MME daily dose: 45, down from 90.  Recommend repeat MRI on the left.  Over the next 3 months have asked him if he can occasionally 1 less pill a day.  He agrees that he will try.  We will months.  Contract on file 11/27/17 Last UDS-has not been into the office since COVID restriction, will try to have him in soon.  [facility]  script sent or current  History of overdose or risk of abuse distant  I have also looked over his other medications.  He does need some refills today. Blood pressure was measured today at 126/99 at his home.  He states he has not had any difficulty with chest pain, shortness of breath.    ROS: Per HPI  No Known Allergies Past Medical History:  Diagnosis Date  . Anxiety   . COPD (chronic obstructive pulmonary disease) ([place])   . Depression   . Diabetes mellitus   . GERD (gastroesophageal reflux disease)   . High cholesterol   . Hypertension   . Insomnia   . Myocardial infarction ([place]) 11/04/13  . Neuropathy     Current Outpatient Medications:  .  albuterol (VENTOLIN HFA) 108 (90 Base) MCG/ACT inhaler, Inhale 2 puffs into the lungs every 6 (six) hours as needed for wheezing or shortness of breath., Disp: 1 Inhaler, Rfl: 5 .  aspirin 81 MG tablet, Take 81 mg by mouth daily., Disp: , Rfl:  .  atorvastatin (LIPITOR) 80 MG tablet, Take 1 tablet (80 mg total) by mouth daily at 6 PM., Disp: 30 tablet, Rfl: 2 .  carvedilol (COREG) 6.25 MG tablet, Take 2 tablets (12.5 mg total) by mouth 2 (two) times daily., Disp: 360 tablet, Rfl: 3 .  citalopram (CELEXA) 20 MG tablet, Take 1 tablet (20 mg total) by mouth daily., Disp: 90 tablet, Rfl: 3 .  furosemide (LASIX) 20 MG tablet, Take 1  tablet (20 mg total) by mouth every morning., Disp: 90 tablet, Rfl: 2 .  gabapentin (NEURONTIN) 300 MG capsule, Take 3 capsules (900 mg total) by mouth 3 (three) times daily. Slowly increase as discussed., Disp: 810 capsule, Rfl: 3 .  glucose blood test strip, Use as instructed, Disp: 100 each, Rfl: 12 .  insulin aspart protamine- aspart (NOVOLOG MIX 70/30) (70-30) 100 UNIT/ML injection, Inject 0.15-0.3 mLs (15-30 Units total) into the skin 3 (three) times daily. (Patient taking differently: Inject 20 Units into the skin 3 (three) times daily. ), Disp: 30 mL, Rfl: 5 .  Insulin Syringe-Needle U-100 (RELION INSULIN SYR  0.3ML/31G) 31G X 5/16" 0.3 ML MISC, USE ONE SYRINGE EACH TIME THREE TIMES DAILY, Disp: 100 each, Rfl: 5 .  metFORMIN (GLUCOPHAGE) 1000 MG tablet, Take 1 tablet (1,000 mg total) by mouth 2 (two) times daily with a meal., Disp: 180 tablet, Rfl: 3 .  oxyCODONE-acetaminophen (PERCOCET) 10-325 MG tablet, Take 1 tablet by mouth every 8 (eight) hours as needed for pain. SERIES of 3, Do not cancel., Disp: 90 tablet, Rfl: 0 .  oxyCODONE-acetaminophen (PERCOCET) 10-325 MG tablet, Take 1 tablet by mouth every 8 (eight) hours as needed (chronic pain). SERIES of 3, Do not cancel., Disp: 90 tablet, Rfl: 0 .  oxyCODONE-acetaminophen (PERCOCET) 10-325 MG tablet, Take 1 tablet by mouth every 8 (eight) hours as needed for pain (chronic pain). SERIES of 3, Do not cancel., Disp: 90 tablet, Rfl: 0 .  pantoprazole (PROTONIX) 40 MG tablet, Take 1 tablet (40 mg total) by mouth daily., Disp: 90 tablet, Rfl: 3 .  rOPINIRole (REQUIP) 0.5 MG tablet, Take 1-2 tablets (0.5-1 mg total) by mouth at bedtime., Disp: 60 tablet, Rfl: 5 .  sacubitril-valsartan (ENTRESTO) 49-51 MG, Take 1 tablet by mouth 2 (two) times daily., Disp: 60 tablet, Rfl: 3 .  spironolactone (ALDACTONE) 25 MG tablet, Take 0.5 tablets (12.5 mg total) by mouth daily., Disp: 30 tablet, Rfl: 2  Assessment/ Plan: 53 y.o. male   1. Essential hypertension - carvedilol (COREG) 6.25 MG tablet; Take 2 tablets (12.5 mg total) by mouth 2 (two) times daily.  Dispense: 360 tablet; Refill: 3  2. Restless legs syndrome - rOPINIRole (REQUIP) 0.5 MG tablet; Take 1-2 tablets (0.5-1 mg total) by mouth at bedtime.  Dispense: 60 tablet; Refill: 5  3. Lumbar disc disease with radiculopathy - oxyCODONE-acetaminophen (PERCOCET) 10-325 MG tablet; Take 1 tablet by mouth every 8 (eight) hours as needed for pain (chronic pain). SERIES of 3, Do not cancel.  Dispense: 90 tablet; Refill: 0  4. Diabetes mellitus without complication (HCC) - metFORMIN (GLUCOPHAGE) 1000 MG tablet; Take 1  tablet (1,000 mg total) by mouth 2 (two) times daily with a meal.  Dispense: 180 tablet; Refill: 3 - Insulin Syringe-Needle U-100 (RELION INSULIN SYR 0.3ML/31G) 31G X 5/16" 0.3 ML MISC; USE ONE SYRINGE EACH TIME THREE TIMES DAILY  Dispense: 100 each; Refill: 5  5. Neuropathy - gabapentin (NEURONTIN) 300 MG capsule; Take 3 capsules (900 mg total) by mouth 3 (three) times daily. Slowly increase as discussed.  Dispense: 810 capsule; Refill: 3   No follow-ups on file.  Continue all other maintenance medications as listed above.  Start time: 8:09 AM End time: 8:25 AM  Meds ordered this encounter  Medications  . carvedilol (COREG) 6.25 MG tablet    Sig: Take 2 tablets (12.5 mg total) by mouth 2 (two) times daily.    Dispense:  360 tablet    Refill:  3  Order Specific Question:   Supervising Provider    Answer:   Terald Sleeper [[id number]]  . rOPINIRole (REQUIP) 0.5 MG tablet    Sig: Take 1-2 tablets (0.5-1 mg total) by mouth at bedtime.    Dispense:  60 tablet    Refill:  5    Order Specific Question:   Supervising Provider    Answer:   Brooke Bonito  . pantoprazole (PROTONIX) 40 MG tablet    Sig: Take 1 tablet (40 mg total) by mouth daily.    Dispense:  90 tablet    Refill:  3    Order Specific Question:   Supervising Provider    Answer:   Brooke Bonito  . DISCONTD: oxyCODONE-acetaminophen (PERCOCET) 10-325 MG tablet    Sig: Take 1 tablet by mouth every 8 (eight) hours as needed for pain. CHRONIC PAIN    Dispense:  90 tablet    Refill:  0    Order Specific Question:   Supervising Provider    Answer:   Brooke Bonito  . metFORMIN (GLUCOPHAGE) 1000 MG tablet    Sig: Take 1 tablet (1,000 mg total) by mouth 2 (two) times daily with a meal.    Dispense:  180 tablet    Refill:  3    Order Specific Question:   Supervising Provider    Answer:   Brooke Bonito  . Insulin Syringe-Needle U-100 (RELION INSULIN SYR 0.3ML/31G) 31G X 5/16" 0.3 ML MISC     Sig: USE ONE SYRINGE EACH TIME THREE TIMES DAILY    Dispense:  100 each    Refill:  5    E11.9    Order Specific Question:   Supervising Provider    Answer:   Brooke Bonito  . citalopram (CELEXA) 20 MG tablet    Sig: Take 1 tablet (20 mg total) by mouth daily.    Dispense:  90 tablet    Refill:  3    Order Specific Question:   Supervising Provider    Answer:   Brooke Bonito  . gabapentin (NEURONTIN) 300 MG capsule    Sig: Take 3 capsules (900 mg total) by mouth 3 (three) times daily. Slowly increase as discussed.    Dispense:  810 capsule    Refill:  3    Order Specific Question:   Supervising Provider    Answer:   Brooke Bonito  . oxyCODONE-acetaminophen (PERCOCET) 10-325 MG tablet    Sig: Take 1 tablet by mouth every 8 (eight) hours as needed for pain. SERIES of 3, Do not cancel.    Dispense:  90 tablet    Refill:  0    Fill 60 days from original script date    Order Specific Question:   Supervising Provider    Answer:   Terald Sleeper [[id number]]  . oxyCODONE-acetaminophen (PERCOCET) 10-325 MG tablet    Sig: Take 1 tablet by mouth every 8 (eight) hours as needed (chronic pain). SERIES of 3, Do not cancel.    Dispense:  90 tablet    Refill:  0    Fill 30 days from original script date    Order Specific Question:   Supervising Provider    Answer:   Terald Sleeper [[id number]]  . oxyCODONE-acetaminophen (PERCOCET) 10-325 MG tablet    Sig: Take 1 tablet by mouth every 8 (eight) hours as needed for pain (chronic pain). SERIES of 3, Do not cancel.  Dispense:  90 tablet    Refill:  0    Order Specific Question:   Supervising Provider    Answer:   Terald Sleeper [[id number]]    Particia Nearing PA-C [place] [phone]

## 2018-10-11 NOTE — Progress Notes (Signed)
Glad to see that he is making some progress with taper from opioid.  Monitor fluid closely as you titrate the gabapentin.  Sometimes this can cause peripheral edema and given his cardiac history would not want to cause any exacerbations.  Ashly M. Lajuana Ripple, [facility]

## 2018-11-04 NOTE — Progress Notes (Deleted)
Cardiology Office Note   Date:  11/04/2018   ID:  Shane Frye, DOB 01-03-1966, MRN [id number]  PCP:  Terald Sleeper, PA-C  Cardiologist:  Dr. Harl Bowie    No chief complaint on file.     History of Present Illness: Shane Frye is a 53 y.o. male who presents for ***  1. Chronic systolic HF/ICM/CAD -history of CAD with cath 2015 with severe 3 vessel CAD s/p CABG 10/2013 - new diagnosis of CHF during 06/2018 admission - 06/2018 echo LVEF 25-30%, restrictive diastolic filling, moderate RV dysfunction - 06/2018 RHC/LHC: LM patent, ostial LAD 85%, ramus 60%, ostial LCX 90%, OM1 100%, RCA distal occlusion. LIMA-LAD patent, occluded SVG-ramus, SVG-OM2, SVG-RPDA.  - CI 2.7, mean PA 42, PCWP 29.   - anatomy not amenable to PCI or repeat CABG. Recs for medical therapy per interventional cards - he was diuresed during admission, reported discharge weight 216 lbs   - last visit we increased entresto to 49/51mg  bid.  - no recent SOB/DOE. No chest pain. No recent edema - home weights 218-220. Mild orthostatic symptoms at times.     2. Posotp afib - discharge summary reports history of PAF, from chart review this was actually postop afib that occurred after CABG in 2015.  - no noted recurrence. Has not been committed to long term anticoag.    3. Substance abuse - cocaine + during recent admission  Needs echo in Sept ***   Past Medical History:  Diagnosis Date  . Anxiety   . COPD (chronic obstructive pulmonary disease) ([place])   . Depression   . Diabetes mellitus   . GERD (gastroesophageal reflux disease)   . High cholesterol   . Hypertension   . Insomnia   . Myocardial infarction ([place]) 11/04/13  . Neuropathy     Past Surgical History:  Procedure Laterality Date  . CARDIOVERSION N/A 11/06/2013   Procedure: CARDIOVERSION;  Surgeon: Thayer Headings, MD;  Location: [place];  Service: Cardiovascular;  Laterality: N/A;  . CARPAL TUNNEL RELEASE  04/2015  . COLONOSCOPY  N/A 05/11/2014   Procedure: COLONOSCOPY;  Surgeon: Rogene Houston, MD;  [facility];  Service: Endoscopy;  Laterality: N/A;  930  . CORONARY ARTERY BYPASS GRAFT N/A 11/09/2013   Procedure: CORONARY ARTERY BYPASS GRAFTING (CABG);  Surgeon: Melrose Nakayama, MD;  Location: [facility];  Service: Open Heart Surgery;  Laterality: N/A;  . INTRAOPERATIVE TRANSESOPHAGEAL ECHOCARDIOGRAM N/A 11/09/2013   Procedure: INTRAOPERATIVE TRANSESOPHAGEAL ECHOCARDIOGRAM;  Surgeon: Melrose Nakayama, MD;  Location: [facility];  Service: Open Heart Surgery;  Laterality: N/A;  . LEFT HEART CATHETERIZATION WITH CORONARY ANGIOGRAM N/A 11/04/2013   Procedure: LEFT HEART CATHETERIZATION WITH CORONARY ANGIOGRAM;  Surgeon: Troy Sine, MD;  Location: [facility] CATH LAB;  Service: Cardiovascular;  Laterality: N/A;  . NO PAST SURGERIES    . RIGHT/LEFT HEART CATH AND CORONARY/GRAFT ANGIOGRAPHY N/A 07/14/2018   Procedure: RIGHT/LEFT HEART CATH AND CORONARY/GRAFT ANGIOGRAPHY;  Surgeon: Wellington Hampshire, MD;  Location: [facility] CV LAB;  Service: Cardiovascular;  Laterality: N/A;     Current Outpatient Medications  Medication Sig Dispense Refill  . albuterol (VENTOLIN HFA) 108 (90 Base) MCG/ACT inhaler Inhale 2 puffs into the lungs every 6 (six) hours as needed for wheezing or shortness of breath. 1 Inhaler 5  . aspirin 81 MG tablet Take 81 mg by mouth daily.    Marland Kitchen atorvastatin (LIPITOR) 80 MG tablet Take 1 tablet (80 mg total) by mouth daily at  6 PM. 30 tablet 2  . carvedilol (COREG) 6.25 MG tablet Take 2 tablets (12.5 mg total) by mouth 2 (two) times daily. 360 tablet 3  . citalopram (CELEXA) 20 MG tablet Take 1 tablet (20 mg total) by mouth daily. 90 tablet 3  . furosemide (LASIX) 20 MG tablet Take 1 tablet (20 mg total) by mouth every morning. 90 tablet 2  . gabapentin (NEURONTIN) 300 MG capsule Take 3 capsules (900 mg total) by mouth 3 (three) times daily. Slowly increase as discussed. 810 capsule 3  . glucose blood  test strip Use as instructed 100 each 12  . insulin aspart protamine- aspart (NOVOLOG MIX 70/30) (70-30) 100 UNIT/ML injection Inject 0.15-0.3 mLs (15-30 Units total) into the skin 3 (three) times daily. (Patient taking differently: Inject 20 Units into the skin 3 (three) times daily. ) 30 mL 5  . Insulin Syringe-Needle U-100 (RELION INSULIN SYR 0.3ML/31G) 31G X 5/16" 0.3 ML MISC USE ONE SYRINGE EACH TIME THREE TIMES DAILY 100 each 5  . metFORMIN (GLUCOPHAGE) 1000 MG tablet Take 1 tablet (1,000 mg total) by mouth 2 (two) times daily with a meal. 180 tablet 3  . oxyCODONE-acetaminophen (PERCOCET) 10-325 MG tablet Take 1 tablet by mouth every 8 (eight) hours as needed for pain. SERIES of 3, Do not cancel. 90 tablet 0  . oxyCODONE-acetaminophen (PERCOCET) 10-325 MG tablet Take 1 tablet by mouth every 8 (eight) hours as needed (chronic pain). SERIES of 3, Do not cancel. 90 tablet 0  . oxyCODONE-acetaminophen (PERCOCET) 10-325 MG tablet Take 1 tablet by mouth every 8 (eight) hours as needed for pain (chronic pain). SERIES of 3, Do not cancel. 90 tablet 0  . pantoprazole (PROTONIX) 40 MG tablet Take 1 tablet (40 mg total) by mouth daily. 90 tablet 3  . rOPINIRole (REQUIP) 0.5 MG tablet Take 1-2 tablets (0.5-1 mg total) by mouth at bedtime. 60 tablet 5  . sacubitril-valsartan (ENTRESTO) 49-51 MG Take 1 tablet by mouth 2 (two) times daily. 60 tablet 3  . spironolactone (ALDACTONE) 25 MG tablet Take 0.5 tablets (12.5 mg total) by mouth daily. 30 tablet 2   No current facility-administered medications for this visit.     Allergies:   Patient has no known allergies.    Social History:  The patient  reports that he quit smoking about 3 months ago. His smoking use included cigarettes. He smoked 0.50 packs per day. He quit smokeless tobacco use about 5 years ago. He reports that he does not drink alcohol or use drugs.   Family History:  The patient's ***family history includes Diabetes in his brother;  Hypertension in his brother and paternal grandfather.    ROS:  General:no colds or fevers, no weight changes Skin:no rashes or ulcers HEENT:no blurred vision, no congestion CV:see HPI PUL:see HPI GI:no diarrhea constipation or melena, no indigestion GU:no hematuria, no dysuria MS:no joint pain, no claudication Neuro:no syncope, no lightheadedness Endo:no diabetes, no thyroid disease Wt Readings from Last 3 Encounters:  10/05/18 218 lb (98.9 kg)  08/17/18 222 lb 9.6 oz (101 kg)  08/12/18 215 lb (97.5 kg)     PHYSICAL EXAM: VS:  There were no vitals taken for this visit. , BMI There is no height or weight on file to calculate BMI. General:Pleasant affect, NAD Skin:Warm and dry, brisk capillary refill HEENT:normocephalic, sclera clear, mucus membranes moist Neck:supple, no JVD, no bruits  Heart:S1S2 RRR without murmur, gallup, rub or click Lungs:clear without rales, rhonchi, or wheezes [id number], non tender, +  BS, do not palpate liver spleen or masses Ext:no lower ext edema, 2+ pedal pulses, 2+ radial pulses Neuro:alert and oriented, MAE, follows commands, + facial symmetry    EKG:  EKG is ordered today. The ekg ordered today demonstrates ***   Recent Labs: 03/09/2018: ALT 6 07/12/2018: B Natriuretic Peptide 1,082.0 07/13/2018: Magnesium 2.3; Platelets 277 07/14/2018: Hemoglobin 15.0 07/16/2018: BUN 16; Creatinine, Ser 1.00; Potassium 3.4; Sodium 135    Lipid Panel    Component Value Date/Time   CHOL 147 03/09/2018 0859   TRIG 113 03/09/2018 0859   HDL 40 03/09/2018 0859   CHOLHDL 3.7 03/09/2018 0859   CHOLHDL 3.4 11/06/2013 0300   VLDL 39 11/06/2013 0300   LDLCALC 84 03/09/2018 0859       Other studies Reviewed: Additional studies/ records that were reviewed today include: ***.   ASSESSMENT AND PLAN:  1.  ***   Current medicines are reviewed with the patient today.  The patient Has no concerns regarding medicines.  The following changes have been made:   See above Labs/ tests ordered today include:see above  Disposition:   FU:  see above  Signed, Cecilie Kicks, NP  11/04/2018 10:52 PM    [facility] [place], [place], [place], [place] Phone: [phone]; Fax: [phone]

## 2018-11-05 ENCOUNTER — Ambulatory Visit: Payer: Medicare HMO | Admitting: Cardiology

## 2018-11-15 ENCOUNTER — Telehealth: Payer: Self-pay | Admitting: Physician Assistant

## 2018-11-15 NOTE — Telephone Encounter (Signed)
Per chart I do not see where anyone called patient - patient aware.

## 2018-12-01 ENCOUNTER — Other Ambulatory Visit: Payer: Self-pay | Admitting: *Deleted

## 2018-12-01 MED ORDER — ATORVASTATIN CALCIUM 80 MG PO TABS: 80.0000 mg | ORAL_TABLET | Freq: Every day | ORAL | 0 refills | Status: DC

## 2018-12-30 ENCOUNTER — Telehealth: Payer: Self-pay | Admitting: Physician Assistant

## 2018-12-30 NOTE — Chronic Care Management (AMB) (Signed)
°  Chronic Care Management   Outreach Note  12/30/2018 Name: Shane Frye MRN: [id number] DOB: 1966/01/13  Referred by: Terald Sleeper, PA-C Reason for referral : Chronic Care Management (Initial CCM outreach was unsuccessful. )   An unsuccessful telephone outreach was attempted today. The patient was referred to the case management team by for assistance with care management and care coordination.   Follow Up Plan: The care management team will reach out to the patient again over the next 7 days.     [place], [place] [address] Direct Dial: [email or web address]  Website: [email or web address]

## 2019-01-03 ENCOUNTER — Ambulatory Visit (INDEPENDENT_AMBULATORY_CARE_PROVIDER_SITE_OTHER): Payer: Medicare HMO | Admitting: Physician Assistant

## 2019-01-03 DIAGNOSIS — J069 Acute upper respiratory infection, unspecified: Secondary | ICD-10-CM | POA: Diagnosis not present

## 2019-01-03 DIAGNOSIS — Z0000 Encounter for general adult medical examination without abnormal findings: Secondary | ICD-10-CM

## 2019-01-03 DIAGNOSIS — M5116 Intervertebral disc disorders with radiculopathy, lumbar region: Secondary | ICD-10-CM

## 2019-01-03 DIAGNOSIS — I509 Heart failure, unspecified: Secondary | ICD-10-CM

## 2019-01-03 DIAGNOSIS — I10 Essential (primary) hypertension: Secondary | ICD-10-CM

## 2019-01-03 DIAGNOSIS — E119 Type 2 diabetes mellitus without complications: Secondary | ICD-10-CM

## 2019-01-03 MED ORDER — OXYCODONE-ACETAMINOPHEN 10-325 MG PO TABS: 1.0000 | ORAL_TABLET | Freq: Three times a day (TID) | ORAL | 0 refills | Status: DC | PRN

## 2019-01-03 MED ORDER — FUROSEMIDE 20 MG PO TABS: 20.0000 mg | ORAL_TABLET | ORAL | 2 refills | Status: DC

## 2019-01-03 MED ORDER — ATORVASTATIN CALCIUM 80 MG PO TABS: 80.0000 mg | ORAL_TABLET | Freq: Every day | ORAL | 0 refills | Status: DC

## 2019-01-03 NOTE — Chronic Care Management (AMB) (Signed)
Chronic Care Management   Note  01/03/2019 Name: Shane Frye MRN: [id number] DOB: 1965/09/16  Shane Frye is a 53 y.o. year old male who is a primary care patient of Terald Sleeper, PA-C. I reached out to Shane Frye by phone today in response to a referral sent by Shane Frye health plan.     Shane Frye was given information about Chronic Care Management services today including:  1. CCM service includes personalized support from designated clinical staff supervised by his physician, including individualized plan of care and coordination with other care providers 2. 24/7 contact phone numbers for assistance for urgent and routine care needs. 3. Service will only be billed when office clinical staff spend 20 minutes or more in a month to coordinate care. 4. Only one practitioner may furnish and bill the service in a calendar month. 5. The patient may stop CCM services at any time (effective at the end of the month) by phone call to the office staff. 6. The patient will be responsible for cost sharing (co-pay) of up to 20% of the service fee (after annual deductible is met).  Patient agreed to services and verbal consent obtained.   Follow up plan: Telephone appointment with CCM team member scheduled for: 01/17/2019  [place], [facility]  [place], [place] [address] Direct Dial: [email or web address]  Website: [email or web address]

## 2019-01-05 ENCOUNTER — Ambulatory Visit: Payer: Medicare HMO | Admitting: Physician Assistant

## 2019-01-09 ENCOUNTER — Encounter: Payer: Self-pay | Admitting: Physician Assistant

## 2019-01-09 NOTE — Progress Notes (Signed)
Telephone visit  Subjective: CC: Recheck on chronic medical conditions PCP: Terald Sleeper, PA-C ZOX:WRUEA D Werts is a 53 y.o. male calls for telephone consult today. Patient provides verbal consent for consult held via phone.  Patient is identified with 2 separate identifiers.  At this time the entire area is on COVID-19 social distancing and stay home orders are in place.  Patient is of higher risk and therefore we are performing this by a virtual method.  Location of patient: home Location of provider: [facility] Others present for call: no  This patient is having a 100-monthon his chronic medical conditions.  They do include coronary artery disease, hypertension, diabetes, degenerative disc disease with chronic pain.  The patient is doing well with his current pain medication and not having to take more.  He states that he is fairly stable overall.  He does have establish house again.  He denies any issues with chest pain, shortness of breath.  He does seem to have labs performed and an order will be placed for him to come and have the labs done in the near future.  PAIN ASSESSMENT: Cause of pain-DDD 2017: Small RIGHT central L4-5 disc extrusion displaces the traversing RIGHT L5 nerve. Degenerative lumbar spine without canal stenosis. Neural foraminal narrowing L4-5 and L5-S1: Moderate on the LEFT at L5-S1. This patient returns for a 3 month recheck on narcotic use for the above named conditions  Current medications-Percocet 10/325 1 to 2 tablets 3 times a day Gabapentin 300 mg 3 times a day.   Medication side effects-no Any concerns-need to lower his medication  Pain on scale of 1-10-8 Frequency-daily What increases pain-walking What makes pain Better-rest Effects on ADL -moderate some days Any change in general medical condition-worsening COPD and CHF  Effectiveness of current meds-good Adverse reactions form pain meds-no PMP AWARE website reviewed:Yes Any  suspicious activity on PMP Aware:No MME daily dose:45,   Contract on file10/11/19 Last UDS-has not been into the office since COVID restriction, will try to have him in soon. Order placed for him to have Toxassure performed  [facility] or current  ROS: Per HPI  No Known Allergies Past Medical History:  Diagnosis Date  . Anxiety   . COPD (ch[place] obstructive pulmonary disease) ([place]   . Depression   . Diabetes mellitus   . GERD (gastroesophageal reflux disease)   . High cholesterol   . Hypertension   . Insomnia   . [place]ocardial infarction ([facility] 11/04/13  . Neuropathy     Current Outpatient Medications:  .  albuterol (VENTOLIN HFA) 108 (90 Base) MCG/ACT inhaler, Inhale 2 puffs into the lungs every 6 (six) hours as needed for wheezing or shortness of breath., Disp: 1 Inhaler, Rfl: 5 .  aspirin 81 MG tablet, Take 81 mg by mouth daily., Disp: , Rfl:  .  atorvastatin (LIPITOR) 80 MG tablet, Take 1 tablet (80 mg total) by mouth daily at 6 PM., Disp: 30 tablet, Rfl: 0 .  carvedilol (COREG) 6.25 MG tablet, Take 2 tablets (12.5 mg total) by mouth 2 (two) times daily., Disp: 360 tablet, Rfl: 3 .  citalopram (CELEXA) 20 MG tablet, Take 1 tablet (20 mg total) by mouth daily., Disp: 90 tablet, Rfl: 3 .  furosemide (LASIX) 20 MG tablet, Take 1 tablet (20 mg total) by mouth every morning., Disp: 90 tablet, Rfl: 2 .  gabapentin (NEURONTIN) 300 MG capsule, Take 3 capsules (900 mg total) by mouth 3 (three) times daily. Slowly  increase as discussed., Disp: 810 capsule, Rfl: 3 .  glucose blood test strip, Use as instructed, Disp: 100 each, Rfl: 12 .  insulin aspart protamine- aspart (NOVOLOG MIX 70/30) (70-30) 100 UNIT/ML injection, Inject 0.15-0.3 mLs (15-30 Units total) into the skin 3 (three) times daily. (Patient taking differently: Inject 20 Units into the skin 3 (three) times daily. ), Disp: 30 mL, Rfl: 5 .  Insulin Syringe-Needle U-100 (RELION INSULIN SYR 0.3ML/31G) 31G X 5/16" 0.3 ML  MISC, USE ONE SYRINGE EACH TIME THREE TIMES DAILY, Disp: 100 each, Rfl: 5 .  metFORMIN (GLUCOPHAGE) 1000 MG tablet, Take 1 tablet (1,000 mg total) by mouth 2 (two) times daily with a meal., Disp: 180 tablet, Rfl: 3 .  oxyCODONE-acetaminophen (PERCOCET) 10-325 MG tablet, Take 1 tablet by mouth every 8 (eight) hours as needed for pain. SERIES of 3, Do not cancel., Disp: 90 tablet, Rfl: 0 .  oxyCODONE-acetaminophen (PERCOCET) 10-325 MG tablet, Take 1 tablet by mouth every 8 (eight) hours as needed (chronic pain). SERIES of 3, Do not cancel., Disp: 90 tablet, Rfl: 0 .  oxyCODONE-acetaminophen (PERCOCET) 10-325 MG tablet, Take 1 tablet by mouth every 8 (eight) hours as needed for pain (chronic pain). SERIES of 3, Do not cancel., Disp: 90 tablet, Rfl: 0 .  pantoprazole (PROTONIX) 40 MG tablet, Take 1 tablet (40 mg total) by mouth daily., Disp: 90 tablet, Rfl: 3 .  rOPINIRole (REQUIP) 0.5 MG tablet, Take 1-2 tablets (0.5-1 mg total) by mouth at bedtime., Disp: 60 tablet, Rfl: 5 .  sacubitril-valsartan (ENTRESTO) 49-51 MG, Take 1 tablet by mouth 2 (two) times daily., Disp: 60 tablet, Rfl: 3 .  spironolactone (ALDACTONE) 25 MG tablet, Take 0.5 tablets (12.5 mg total) by mouth daily., Disp: 30 tablet, Rfl: 2  Assessment/ Plan: 53 y.o. male   1. Upper respiratory tract infection, unspecified type - DRUG SCREEN-TOXASSURE; Future  2. Lumbar disc disease with radiculopathy - oxyCODONE-acetaminophen (PERCOCET) 10-325 MG tablet; Take 1 tablet by mouth every 8 (eight) hours as needed for pain. SERIES of 3, Do not cancel.  Dispense: 90 tablet; Refill: 0 - oxyCODONE-acetaminophen (PERCOCET) 10-325 MG tablet; Take 1 tablet by mouth every 8 (eight) hours as needed (chronic pain). SERIES of 3, Do not cancel.  Dispense: 90 tablet; Refill: 0 - oxyCODONE-acetaminophen (PERCOCET) 10-325 MG tablet; Take 1 tablet by mouth every 8 (eight) hours as needed for pain (chronic pain). SERIES of 3, Do not cancel.  Dispense: 90  tablet; Refill: 0 - DRUG SCREEN-TOXASSURE; Future  3. Chronic congestive heart failure, unspecified heart failure type (HCC) - atorvastatin (LIPITOR) 80 MG tablet; Take 1 tablet (80 mg total) by mouth daily at 6 PM.  Dispense: 30 tablet; Refill: 0 - furosemide (LASIX) 20 MG tablet; Take 1 tablet (20 mg total) by mouth every morning.  Dispense: 90 tablet; Refill: 2 - DRUG SCREEN-TOXASSURE; Future  4. Essential hypertension - CBC with Differential/Platelet; Future - CMP14+EGFR; Future - Lipid Panel; Future - TSH; Future  5. Diabetes mellitus without complication (HCC) - atorvastatin (LIPITOR) 80 MG tablet; Take 1 tablet (80 mg total) by mouth daily at 6 PM.  Dispense: 30 tablet; Refill: 0 - CBC with Differential/Platelet; Future - CMP14+EGFR; Future - Lipid Panel; Future - TSH; Future  6. Well adult exam - CBC with Differential/Platelet; Future - CMP14+EGFR; Future - Lipid Panel; Future - TSH; Future - Microalbumin / creatinine urine ratio; Future - PSA; Future   Recheck 3 months  Continue all other maintenance medications as listed above.  Start  time: 9:15 AM End time: 9:27 AM  Meds ordered this encounter  Medications  . oxyCODONE-acetaminophen (PERCOCET) 10-325 MG tablet    Sig: Take 1 tablet by mouth every 8 (eight) hours as needed for pain. SERIES of 3, Do not cancel.    Dispense:  90 tablet    Refill:  0    Fill 60 days from original script date    Order Specific Question:   Supervising Provider    Answer:   Janora Norlander [[id number]]  . oxyCODONE-acetaminophen (PERCOCET) 10-325 MG tablet    Sig: Take 1 tablet by mouth every 8 (eight) hours as needed (chronic pain). SERIES of 3, Do not cancel.    Dispense:  90 tablet    Refill:  0    Fill 30 days from original script date    Order Specific Question:   Supervising Provider    Answer:   Janora Norlander [[id number]]  . oxyCODONE-acetaminophen (PERCOCET) 10-325 MG tablet    Sig: Take 1 tablet by mouth every 8  (eight) hours as needed for pain (chronic pain). SERIES of 3, Do not cancel.    Dispense:  90 tablet    Refill:  0    Order Specific Question:   Supervising Provider    Answer:   Janora Norlander [[id number]]  . atorvastatin (LIPITOR) 80 MG tablet    Sig: Take 1 tablet (80 mg total) by mouth daily at 6 PM.    Dispense:  30 tablet    Refill:  0    Order Specific Question:   Supervising Provider    Answer:   Janora Norlander [[id number]]  . furosemide (LASIX) 20 MG tablet    Sig: Take 1 tablet (20 mg total) by mouth every morning.    Dispense:  90 tablet    Refill:  2    Order Specific Question:   Supervising Provider    Answer:   Janora Norlander [[id number]]    Particia Nearing PA-C [place] [phone]

## 2019-01-17 ENCOUNTER — Ambulatory Visit: Payer: Medicare HMO | Admitting: *Deleted

## 2019-01-17 DIAGNOSIS — I5043 Acute on chronic combined systolic (congestive) and diastolic (congestive) heart failure: Secondary | ICD-10-CM

## 2019-01-17 DIAGNOSIS — I10 Essential (primary) hypertension: Secondary | ICD-10-CM

## 2019-01-17 DIAGNOSIS — IMO0002 Reserved for concepts with insufficient information to code with codable children: Secondary | ICD-10-CM

## 2019-01-17 DIAGNOSIS — I480 Paroxysmal atrial fibrillation: Secondary | ICD-10-CM

## 2019-01-17 DIAGNOSIS — E1159 Type 2 diabetes mellitus with other circulatory complications: Secondary | ICD-10-CM

## 2019-01-17 NOTE — Chronic Care Management (AMB) (Signed)
  Chronic Care Management   Outreach Note  01/17/2019 Name: Shane Frye MRN: [id number] DOB: 1965/10/29  Referred by: Terald Sleeper, PA-C Reason for referral : Chronic Care Management (RN Initial Visit)  Unsuccessful Initial RN outreach for CCM services. Patient has agreed to services to aid in self-management of his chronic medical conditions.   I was unable to leave voicemail message.  Follow Up Plan: The care management team will reach out to the patient again over the next 30 days.     Chong Sicilian, BSN, RN-BC Embedded Chronic Care Manager [facility] Direct Dial: [phone]

## 2019-01-17 NOTE — Patient Instructions (Signed)
  Follow Up Plan: The care management team will reach out to the patient again over the next 30 days.     Chong Sicilian, BSN, RN-BC Embedded Chronic Care Manager [facility] Direct Dial: [phone]

## 2019-01-28 ENCOUNTER — Encounter: Payer: Self-pay | Admitting: Physician Assistant

## 2019-02-01 ENCOUNTER — Telehealth: Payer: Self-pay | Admitting: Physician Assistant

## 2019-02-01 NOTE — Telephone Encounter (Signed)
Patient states his pharmacy told him to call here there was a hold put on his pain meds? Do you know anything?

## 2019-02-01 NOTE — Telephone Encounter (Signed)
See below

## 2019-02-01 NOTE — Telephone Encounter (Signed)
He should be receiving a letter about a FAILED drug screen through the hospital visit earlier this year. It was positive for cocaine but negative for the medicines I prescribe.  Therefore I am required to stop prescribing any controlled substances to you.  It is in our contract and office policy.

## 2019-02-02 ENCOUNTER — Other Ambulatory Visit: Payer: Self-pay | Admitting: *Deleted

## 2019-02-02 MED ORDER — ENTRESTO 49-51 MG PO TABS: 1.0000 | ORAL_TABLET | Freq: Two times a day (BID) | ORAL | 0 refills | Status: DC

## 2019-02-02 NOTE — Telephone Encounter (Signed)
Patient aware and verbalized understanding. °

## 2019-02-02 NOTE — Telephone Encounter (Signed)
Patient states he will just go somewhere else.

## 2019-02-03 ENCOUNTER — Encounter: Payer: Self-pay | Admitting: Cardiology

## 2019-02-03 DIAGNOSIS — I5021 Acute systolic (congestive) heart failure: Secondary | ICD-10-CM | POA: Diagnosis not present

## 2019-02-03 DIAGNOSIS — J449 Chronic obstructive pulmonary disease, unspecified: Secondary | ICD-10-CM | POA: Diagnosis not present

## 2019-02-03 DIAGNOSIS — R0609 Other forms of dyspnea: Secondary | ICD-10-CM | POA: Diagnosis not present

## 2019-02-08 ENCOUNTER — Telehealth: Payer: Self-pay | Admitting: Cardiology

## 2019-02-08 NOTE — Telephone Encounter (Signed)
Patient having issues with his fluid build up -

## 2019-02-08 NOTE — Telephone Encounter (Signed)
As Dr. Harl Bowie is out of the office, he can be notified once records are obtained and he has returned for his review.

## 2019-02-08 NOTE — Telephone Encounter (Signed)
Patient went to [facility] ED last week for leg, abdomen swelling.States he was told to increase Lasix to 40 mg every day but he is taking up to 60 mg daily when he feels like it.I have requested records from ED. Current weight is 223 lbs and wants to [place] regarding this.

## 2019-02-09 NOTE — Telephone Encounter (Signed)
Records received, place in Dr.Branch's in box

## 2019-03-15 ENCOUNTER — Encounter: Payer: Self-pay | Admitting: Cardiology

## 2019-03-15 ENCOUNTER — Encounter: Payer: Self-pay | Admitting: *Deleted

## 2019-03-15 ENCOUNTER — Telehealth (INDEPENDENT_AMBULATORY_CARE_PROVIDER_SITE_OTHER): Payer: Medicare HMO | Admitting: Cardiology

## 2019-03-15 VITALS — Ht 71.0 in | Wt 225.0 lb

## 2019-03-15 DIAGNOSIS — I2510 Atherosclerotic heart disease of native coronary artery without angina pectoris: Secondary | ICD-10-CM

## 2019-03-15 DIAGNOSIS — I5022 Chronic systolic (congestive) heart failure: Secondary | ICD-10-CM

## 2019-03-15 NOTE — Patient Instructions (Signed)
Your physician recommends that you schedule a follow-up appointment in: 2 MONTHS WITH DR BRANCH  Your physician recommends that you continue on your current medications as directed. Please refer to the Current Medication list given to you today.  Your physician has requested that you have an echocardiogram. Echocardiography is a painless test that uses sound waves to create images of your heart. It provides your doctor with information about the size and shape of your heart and how well your heart's chambers and valves are working. This procedure takes approximately one hour. There are no restrictions for this procedure.  Thank you for choosing [facility]!!     

## 2019-03-15 NOTE — Addendum Note (Signed)
Addended by: Julian Hy T on: 03/15/2019 11:01 AM   Modules accepted: Orders

## 2019-03-15 NOTE — Progress Notes (Signed)
Virtual Visit via Telephone Note   This visit type was conducted due to national recommendations for restrictions regarding the COVID-19 Pandemic (e.g. social distancing) in an effort to limit this patient's exposure and mitigate transmission in our community.  Due to his co-morbid illnesses, this patient is at least at moderate risk for complications without adequate follow up.  This format is felt to be most appropriate for this patient at this time.  The patient did not have access to video technology/had technical difficulties with video requiring transitioning to audio format only (telephone).  All issues noted in this document were discussed and addressed.  No physical exam could be performed with this format.  Please refer to the patient's chart for his  consent to telehealth for [facility].   Date:  03/15/2019   ID:  Shane Frye, DOB [birth year], MRN [id number]  Patient Location: Home Provider Location: Office  PCP:  Terald Sleeper, PA-C  Cardiologist:  Carlyle Dolly, MD  Electrophysiologist:  None   Evaluation Performed:  Follow-Up Visit  Chief Complaint:  Follow up  History of Present Illness:    Shane Frye is a 54 y.o. male seen today for follow up of the following medical problems.  1. Chronic systolic HF/ICM/CAD -history of CAD with cath 2015 with severe 3 vessel CAD s/p CABG 10/2013 - new diagnosis of CHF during 06/2018 admission - 06/2018 echo LVEF 25-30%, restrictive diastolic filling, moderate RV dysfunction - 06/2018 RHC/LHC: LM patent, ostial LAD 85%, ramus 60%, ostial LCX 90%, OM1 100%, RCA distal occlusion. LIMA-LAD patent, occluded SVG-ramus, SVG-OM2, SVG-RPDA.  - CI 2.7, mean PA 42, PCWP 29.   - anatomy not amenable to PCI or repeat CABG. Recs for medical therapy per interventional cards - he was diuresed during admission, reported discharge weight 216 lbs     - isolated episode of SOB, seen in [facility] hospital about 1 month ago for  HF. He had run out of his meds - no recurrent SOB. Mild edema. Back on meds and compliant     2. Posotp afib - discharge summary reports history of PAF, from chart review this was actually postop afib that occurred after CABG in 2015.  - no noted recurrence. Has not been committed to long term anticoag.    3. Substance abuse - cocaine + during prior admission    The patient does not have symptoms concerning for COVID-19 infection (fever, chills, cough, or new shortness of breath).    Past Medical History:  Diagnosis Date  . Anxiety   . COPD (chronic obstructive pulmonary disease) ([place])   . Depression   . Diabetes mellitus   . GERD (gastroesophageal reflux disease)   . High cholesterol   . Hypertension   . Insomnia   . Myocardial infarction ([place]) 11/04/13  . Neuropathy    Past Surgical History:  Procedure Laterality Date  . CARDIOVERSION N/A 11/06/2013   Procedure: CARDIOVERSION;  Surgeon: Thayer Headings, MD;  Location: [place];  Service: Cardiovascular;  Laterality: N/A;  . CARPAL TUNNEL RELEASE  04/2015  . COLONOSCOPY N/A 05/11/2014   Procedure: COLONOSCOPY;  Surgeon: Rogene Houston, MD;  [facility];  Service: Endoscopy;  Laterality: N/A;  930  . CORONARY ARTERY BYPASS GRAFT N/A 11/09/2013   Procedure: CORONARY ARTERY BYPASS GRAFTING (CABG);  Surgeon: Melrose Nakayama, MD;  Location: [place];  Service: Open Heart Surgery;  Laterality: N/A;  . INTRAOPERATIVE TRANSESOPHAGEAL ECHOCARDIOGRAM N/A 11/09/2013   Procedure: INTRAOPERATIVE TRANSESOPHAGEAL  ECHOCARDIOGRAM;  Surgeon: Melrose Nakayama, MD;  Location: [place];  Service: Open Heart Surgery;  Laterality: N/A;  . LEFT HEART CATHETERIZATION WITH CORONARY ANGIOGRAM N/A 11/04/2013   Procedure: LEFT HEART CATHETERIZATION WITH CORONARY ANGIOGRAM;  Surgeon: Troy Sine, MD;  Location: [facility] CATH LAB;  Service: Cardiovascular;  Laterality: N/A;  . NO PAST SURGERIES    . RIGHT/LEFT HEART CATH AND CORONARY/GRAFT  ANGIOGRAPHY N/A 07/14/2018   Procedure: RIGHT/LEFT HEART CATH AND CORONARY/GRAFT ANGIOGRAPHY;  Surgeon: Wellington Hampshire, MD;  Location: [place] CV LAB;  Service: Cardiovascular;  Laterality: N/A;     Current Meds  Medication Sig  . albuterol (VENTOLIN HFA) 108 (90 Base) MCG/ACT inhaler Inhale 2 puffs into the lungs every 6 (six) hours as needed for wheezing or shortness of breath.  Marland Kitchen aspirin 81 MG tablet Take 81 mg by mouth daily.  Marland Kitchen atorvastatin (LIPITOR) 80 MG tablet Take 1 tablet (80 mg total) by mouth daily at 6 PM.  . carvedilol (COREG) 6.25 MG tablet Take 2 tablets (12.5 mg total) by mouth 2 (two) times daily.  . citalopram (CELEXA) 20 MG tablet Take 1 tablet (20 mg total) by mouth daily.  . furosemide (LASIX) 20 MG tablet Take 1 tablet (20 mg total) by mouth every morning.  . gabapentin (NEURONTIN) 300 MG capsule Take 3 capsules (900 mg total) by mouth 3 (three) times daily. Slowly increase as discussed.  Marland Kitchen glucose blood test strip Use as instructed  . insulin aspart protamine- aspart (NOVOLOG MIX 70/30) (70-30) 100 UNIT/ML injection Inject 0.15-0.3 mLs (15-30 Units total) into the skin 3 (three) times daily. (Patient taking differently: Inject 20 Units into the skin 3 (three) times daily. )  . Insulin Syringe-Needle U-100 (RELION INSULIN SYR 0.3ML/31G) 31G X 5/16" 0.3 ML MISC USE ONE SYRINGE EACH TIME THREE TIMES DAILY  . metFORMIN (GLUCOPHAGE) 1000 MG tablet Take 1 tablet (1,000 mg total) by mouth 2 (two) times daily with a meal.  . pantoprazole (PROTONIX) 40 MG tablet Take 1 tablet (40 mg total) by mouth daily.  Marland Kitchen rOPINIRole (REQUIP) 0.5 MG tablet Take 1-2 tablets (0.5-1 mg total) by mouth at bedtime.  . sacubitril-valsartan (ENTRESTO) 49-51 MG Take 1 tablet by mouth 2 (two) times daily.  Marland Kitchen spironolactone (ALDACTONE) 25 MG tablet Take 0.5 tablets (12.5 mg total) by mouth daily.     Allergies:   Patient has no known allergies.   Social History   Tobacco Use  . Smoking  status: Former Smoker    Packs/day: 0.50    Types: Cigarettes    Quit date: 07/16/2018    Years since quitting: 0.6  . Smokeless tobacco: Former Systems developer    Quit date: 11/04/2013  . Tobacco comment: reports quitting 07/16/2018  Substance Use Topics  . Alcohol use: No    Alcohol/week: 2.0 standard drinks    Types: 2 Cans of beer per week  . Drug use: No    Comment: cocaine over 3 years ago     Family Hx: The patient's family history includes Diabetes in his brother; Hypertension in his brother and paternal grandfather. There is no history of Neuropathy.  ROS:   Please see the history of present illness.    All other systems reviewed and are negative.   Prior CV studies:   The following studies were reviewed today:  06/2018 echo IMPRESSIONS   1. The left ventricle has severely reduced systolic function, with an ejection fraction of 25-30%. The cavity size was mildly dilated. There  is mildly increased left ventricular wall thickness. Left ventricular diastolic Doppler parameters are  consistent with restrictive filling. Elevated mean left atrial pressure. 2. The right ventricle has moderately reduced systolic function. The cavity was normal. There is no increase in right ventricular wall thickness. 3. Left atrial size was severely dilated. 4. Right atrial size was mildly dilated. 5. No evidence of mitral valve stenosis. 6. The aortic valve has an indeterminate number of cusps. No stenosis of the aortic valve. 7. The aortic root is normal in size and structure. 8. Pulmonary hypertension is indeterminant, inadequate TR jet. 9. The inferior vena cava was dilated in size with >50% respiratory variability.  06/2018 cath  Ost Ramus to Ramus lesion is 60% stenosed.  Ost Cx to Prox Cx lesion is 90% stenosed.  Mid Cx lesion is 80% stenosed.  Ost 1st Mrg lesion is 100% stenosed.  LIMA and is normal in caliber.  Prox RCA to Mid RCA lesion is 30% stenosed.  SVG.  Origin  to Prox Graft lesion is 100% stenosed.  SVG.  Origin to Prox Graft lesion is 100% stenosed.  SVG.  Origin lesion is 100% stenosed.  Prox LAD lesion is 70% stenosed.  Ost LAD to Prox LAD lesion is 85% stenosed.  Dist RCA lesion is 100% stenosed.  1. Severe underlying three-vessel coronary artery disease with occluded vein grafts (SVG to OM, SVG to diagonal/ramus and SVG to right PDA/PL). The only patent graft is LIMA to LAD. The native coronary arteries are heavily calcified and diffusely diseased. 2. Left ventricular angiography was not performed. EF was severely reduced by echo at 20%. 3. Right heart catheterization showed moderately elevated filling pressures, moderate pulmonary hypertension and normal cardiac output.  Recommendations: Unfortunately, all vein grafts are occluded. The RCA is occluded distally with heavy calcifications. The left circumflex is heavily calcified and diffusely diseased. Both of these are not suitable for PCI. Also the distal branches are relatively small and likely not good targets for redo CABG. His best option is aggressive medical therapy. Regarding heart failure, he continues to be moderately volume overloaded with a wedge pressure of 29 mmHg. I resumed IV furosemide. I added losartan. Consider transitioning to [facility] and adding spironolactone.   Labs/Other Tests and Data Reviewed:    EKG:  No ECG reviewed.  Recent Labs: 07/12/2018: B Natriuretic Peptide 1,082.0 07/13/2018: Magnesium 2.3; Platelets 277 07/14/2018: Hemoglobin 15.0 07/16/2018: BUN 16; Creatinine, Ser 1.00; Potassium 3.4; Sodium 135   Recent Lipid Panel Lab Results  Component Value Date/Time   CHOL 147 03/09/2018 08:59 AM   TRIG 113 03/09/2018 08:59 AM   HDL 40 03/09/2018 08:59 AM   CHOLHDL 3.7 03/09/2018 08:59 AM   CHOLHDL 3.4 11/06/2013 03:00 AM   LDLCALC 84 03/09/2018 08:59 AM    Wt Readings from Last 3 Encounters:  03/15/19 225 lb (102.1 kg)  10/05/18  218 lb (98.9 kg)  08/17/18 222 lb 9.6 oz (101 kg)     Objective:    Vital Signs:  Ht 5\' 11"  (1.803 m)   Wt 225 lb (102.1 kg)   BMI 31.38 kg/m    Normal affect. Normal speech pattern and tone. Comfortable, no apparent distress. No audible signs of SOB or wheezing.   ASSESSMENT & PLAN:     1. Chronic systolic HF/CAD/ICM - no current symptoms - repeat echo,pending results consider further titration of meds. Possible ICD consideration.   COVID-19 Education: The signs and symptoms of COVID-19 were discussed with the patient and how to  seek care for testing (follow up with PCP or arrange E-visit).  The importance of social distancing was discussed today.  Time:   Today, I have spent 13 minutes with the patient with telehealth technology discussing the above problems.     Medication Adjustments/Labs and Tests Ordered: Current medicines are reviewed at length with the patient today.  Concerns regarding medicines are outlined above.   Tests Ordered: No orders of the defined types were placed in this encounter.   Medication Changes: No orders of the defined types were placed in this encounter.   Follow Up:  Either In Person or Virtual in 2 month(s)  Signed, Carlyle Dolly, MD  03/15/2019 10:48 AM    [place]

## 2019-03-19 DIAGNOSIS — Z8679 Personal history of other diseases of the circulatory system: Secondary | ICD-10-CM | POA: Diagnosis not present

## 2019-03-19 DIAGNOSIS — U071 COVID-19: Secondary | ICD-10-CM | POA: Diagnosis not present

## 2019-03-19 DIAGNOSIS — J449 Chronic obstructive pulmonary disease, unspecified: Secondary | ICD-10-CM | POA: Diagnosis not present

## 2019-03-19 DIAGNOSIS — R0602 Shortness of breath: Secondary | ICD-10-CM | POA: Diagnosis not present

## 2019-03-19 DIAGNOSIS — R5383 Other fatigue: Secondary | ICD-10-CM | POA: Diagnosis not present

## 2019-03-22 ENCOUNTER — Other Ambulatory Visit: Payer: Self-pay | Admitting: *Deleted

## 2019-03-22 DIAGNOSIS — E119 Type 2 diabetes mellitus without complications: Secondary | ICD-10-CM

## 2019-03-22 DIAGNOSIS — I509 Heart failure, unspecified: Secondary | ICD-10-CM

## 2019-03-22 MED ORDER — SPIRONOLACTONE 25 MG PO TABS: 12.5000 mg | ORAL_TABLET | Freq: Every day | ORAL | 2 refills | Status: DC

## 2019-03-22 MED ORDER — ENTRESTO 49-51 MG PO TABS: 1.0000 | ORAL_TABLET | Freq: Two times a day (BID) | ORAL | 6 refills | Status: DC

## 2019-03-22 MED ORDER — ATORVASTATIN CALCIUM 80 MG PO TABS: 80.0000 mg | ORAL_TABLET | Freq: Every day | ORAL | 2 refills | Status: AC

## 2019-03-28 ENCOUNTER — Telehealth: Payer: Self-pay | Admitting: *Deleted

## 2019-03-28 NOTE — Telephone Encounter (Signed)
Ebony with [employer] called on behalf of Shane Frye requesting phone number for patient so that she could inform him that his echo has been approved. Charlena Cross says she has tried reaching out to patient but doesn't have a working number. Advised that patient would be contacted and given her information to contact her. [phone].

## 2019-03-28 NOTE — Telephone Encounter (Signed)
Patient contacted but no answer-unable to leave message.

## 2019-03-29 NOTE — Telephone Encounter (Signed)
Patient informed. Patient reports that he tested positive for covid-19 on 03/26/2019 and need to reschedule his echocardiogram.

## 2019-03-30 ENCOUNTER — Emergency Department (HOSPITAL_COMMUNITY)
Admission: EM | Admit: 2019-03-30 | Discharge: 2019-03-30 | Disposition: A | Discharge status: Home / Self Care | Payer: Medicare HMO | Attending: Emergency Medicine | Admitting: Emergency Medicine

## 2019-03-30 ENCOUNTER — Other Ambulatory Visit: Payer: Self-pay

## 2019-03-30 ENCOUNTER — Encounter (HOSPITAL_COMMUNITY): Payer: Self-pay | Admitting: *Deleted

## 2019-03-30 ENCOUNTER — Emergency Department (HOSPITAL_COMMUNITY): Payer: Medicare HMO

## 2019-03-30 DIAGNOSIS — E119 Type 2 diabetes mellitus without complications: Secondary | ICD-10-CM | POA: Insufficient documentation

## 2019-03-30 DIAGNOSIS — J441 Chronic obstructive pulmonary disease with (acute) exacerbation: Secondary | ICD-10-CM

## 2019-03-30 DIAGNOSIS — I252 Old myocardial infarction: Secondary | ICD-10-CM | POA: Diagnosis not present

## 2019-03-30 DIAGNOSIS — I5042 Chronic combined systolic (congestive) and diastolic (congestive) heart failure: Secondary | ICD-10-CM | POA: Diagnosis not present

## 2019-03-30 DIAGNOSIS — I110 Hypertensive heart disease with heart failure: Secondary | ICD-10-CM | POA: Insufficient documentation

## 2019-03-30 DIAGNOSIS — Z7982 Long term (current) use of aspirin: Secondary | ICD-10-CM | POA: Diagnosis not present

## 2019-03-30 DIAGNOSIS — Z79899 Other long term (current) drug therapy: Secondary | ICD-10-CM | POA: Diagnosis not present

## 2019-03-30 DIAGNOSIS — Z794 Long term (current) use of insulin: Secondary | ICD-10-CM | POA: Insufficient documentation

## 2019-03-30 DIAGNOSIS — I2510 Atherosclerotic heart disease of native coronary artery without angina pectoris: Secondary | ICD-10-CM | POA: Diagnosis not present

## 2019-03-30 DIAGNOSIS — Z87891 Personal history of nicotine dependence: Secondary | ICD-10-CM | POA: Diagnosis not present

## 2019-03-30 DIAGNOSIS — Z951 Presence of aortocoronary bypass graft: Secondary | ICD-10-CM | POA: Diagnosis not present

## 2019-03-30 DIAGNOSIS — E1165 Type 2 diabetes mellitus with hyperglycemia: Secondary | ICD-10-CM | POA: Diagnosis not present

## 2019-03-30 DIAGNOSIS — R0602 Shortness of breath: Secondary | ICD-10-CM | POA: Diagnosis not present

## 2019-03-30 DIAGNOSIS — Z20822 Contact with and (suspected) exposure to covid-19: Secondary | ICD-10-CM | POA: Diagnosis not present

## 2019-03-30 DIAGNOSIS — R531 Weakness: Secondary | ICD-10-CM | POA: Diagnosis not present

## 2019-03-30 LAB — POC SARS CORONAVIRUS 2 AG -  ED: SARS Coronavirus 2 Ag: NEGATIVE

## 2019-03-30 MED ORDER — DEXAMETHASONE SODIUM PHOSPHATE 10 MG/ML IJ SOLN
6.0000 mg | Freq: Once | INTRAMUSCULAR | Status: AC
  Administered 2019-03-30: 6 mg via INTRAMUSCULAR
  Filled 2019-03-30: qty 1

## 2019-03-30 MED ORDER — DOXYCYCLINE HYCLATE 100 MG PO CAPS: 100.0000 mg | ORAL_CAPSULE | Freq: Two times a day (BID) | ORAL | 0 refills | Status: DC

## 2019-03-30 MED ORDER — ALBUTEROL SULFATE HFA 108 (90 BASE) MCG/ACT IN AERS
2.0000 | INHALATION_SPRAY | RESPIRATORY_TRACT | Status: DC
  Administered 2019-03-30: 2 via RESPIRATORY_TRACT
  Filled 2019-03-30: qty 6.7

## 2019-03-30 MED ORDER — AZITHROMYCIN 250 MG PO TABS: ORAL_TABLET | ORAL | 0 refills | Status: DC

## 2019-03-30 MED ORDER — DOXYCYCLINE HYCLATE 100 MG PO TABS
100.0000 mg | ORAL_TABLET | Freq: Once | ORAL | Status: AC
  Administered 2019-03-30: 100 mg via ORAL
  Filled 2019-03-30: qty 1

## 2019-03-30 MED ORDER — IPRATROPIUM-ALBUTEROL 0.5-2.5 (3) MG/3ML IN SOLN
3.0000 mL | Freq: Once | RESPIRATORY_TRACT | Status: AC
  Administered 2019-03-30: 3 mL via RESPIRATORY_TRACT
  Filled 2019-03-30: qty 3

## 2019-03-30 MED ORDER — HYDROCOD POLST-CPM POLST ER 10-8 MG/5ML PO SUER
5.0000 mL | Freq: Once | ORAL | Status: AC
  Administered 2019-03-30: 5 mL via ORAL
  Filled 2019-03-30: qty 5

## 2019-03-30 NOTE — ED Provider Notes (Signed)
[facility] EMERGENCY DEPARTMENT Provider Note   CSN: [id number] Arrival date & time: 03/30/19  1639     History Chief Complaint  Patient presents with  . Shortness of Breath    Shane Frye is a 54 y.o. male.  Complaint cough and dyspnea.  Patient has a known history of COPD.  O2 sats 95% on room air.  Glucose elevated.  Blood pressure normal.  No known Covid exposure.  He has run out of his breathing medication.  Severity of symptoms is mild to moderate.  Nothing makes symptoms better or worse        Past Medical History:  Diagnosis Date  . Anxiety   . COPD (chronic obstructive pulmonary disease) ([place])   . Depression   . Diabetes mellitus   . GERD (gastroesophageal reflux disease)   . High cholesterol   . Hypertension   . Insomnia   . Myocardial infarction ([place]) 11/04/13  . Neuropathy     Patient Active Problem List   Diagnosis Date Noted  . Acute on chronic combined systolic and diastolic CHF (congestive heart failure) ([place]) 07/14/2018  . Hyperosmolar non-ketotic state in patient with type 2 diabetes mellitus ([place]) 07/12/2018  . Pain management contract agreement 12/27/2015  . Lumbar disc disease with radiculopathy 11/27/2015  . OSA (obstructive sleep apnea) 07/21/2015  . Hypertension   . GERD (gastroesophageal reflux disease)   . Uncontrolled diabetes mellitus with circulatory complication ([place])   . Insomnia   . Neuropathy   . Rotator cuff tear 01/30/2014  . Dyspnea on exertion 12/26/2013  . Smoker 12/26/2013  . PAF (paroxysmal atrial fibrillation) ([place]) 12/26/2013  . 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 HLD (hyperlipidemia) 11/04/2013  . Obesity 11/04/2013  . Cocaine abuse ([place]) 11/04/2013  . H/O noncompliance with medical treatment, presenting hazards to health 11/04/2013  . CAD (coronary artery disease), native coronary artery 11/04/2013    Past Surgical History:  Procedure Laterality Date  . CARDIOVERSION N/A 11/06/2013   Procedure: CARDIOVERSION;   Surgeon: Thayer Headings, MD;  Location: [place];  Service: Cardiovascular;  Laterality: N/A;  . CARPAL TUNNEL RELEASE  04/2015  . COLONOSCOPY N/A 05/11/2014   Procedure: COLONOSCOPY;  Surgeon: Rogene Houston, MD;  [facility];  Service: Endoscopy;  Laterality: N/A;  930  . CORONARY ARTERY BYPASS GRAFT N/A 11/09/2013   Procedure: CORONARY ARTERY BYPASS GRAFTING (CABG);  Surgeon: Melrose Nakayama, MD;  Location: [place];  Service: Open Heart Surgery;  Laterality: N/A;  . INTRAOPERATIVE TRANSESOPHAGEAL ECHOCARDIOGRAM N/A 11/09/2013   Procedure: INTRAOPERATIVE TRANSESOPHAGEAL ECHOCARDIOGRAM;  Surgeon: Melrose Nakayama, MD;  Location: [place];  Service: Open Heart Surgery;  Laterality: N/A;  . LEFT HEART CATHETERIZATION WITH CORONARY ANGIOGRAM N/A 11/04/2013   Procedure: LEFT HEART CATHETERIZATION WITH CORONARY ANGIOGRAM;  Surgeon: Troy Sine, MD;  Location: [facility] CATH LAB;  Service: Cardiovascular;  Laterality: N/A;  . NO PAST SURGERIES    . RIGHT/LEFT HEART CATH AND CORONARY/GRAFT ANGIOGRAPHY N/A 07/14/2018   Procedure: RIGHT/LEFT HEART CATH AND CORONARY/GRAFT ANGIOGRAPHY;  Surgeon: Wellington Hampshire, MD;  Location: [place] CV LAB;  Service: Cardiovascular;  Laterality: N/A;       Family History  Problem Relation Age of Onset  . Hypertension Paternal Grandfather   . Hypertension Brother   . Diabetes Brother   . Neuropathy Neg Hx     Social History   Tobacco Use  . Smoking status: Former Smoker    Packs/day: 0.50  Types: Cigarettes    Quit date: 07/16/2018    Years since quitting: 0.7  . Smokeless tobacco: Former Systems developer    Quit date: 11/04/2013  . Tobacco comment: reports quitting 07/16/2018  Substance Use Topics  . Alcohol use: No    Alcohol/week: 2.0 standard drinks    Types: 2 Cans of beer per week  . Drug use: No    Comment: cocaine over 3 years ago    Home Medications Prior to Admission medications   Medication Sig Start Date End Date Taking? Authorizing  Provider  albuterol (VENTOLIN HFA) 108 (90 Base) MCG/ACT inhaler Inhale 2 puffs into the lungs every 6 (six) hours as needed for wheezing or shortness of breath. 06/29/18  Yes Terald Sleeper, PA-C  aspirin 81 MG tablet Take 81 mg by mouth daily.   Yes [provider]  atorvastatin (LIPITOR) 80 MG tablet Take 1 tablet (80 mg total) by mouth daily at 6 PM. 03/22/19  Yes Branch, Alphonse Guild, MD  carvedilol (COREG) 6.25 MG tablet Take 2 tablets (12.5 mg total) by mouth 2 (two) times daily. 10/05/18  Yes Terald Sleeper, PA-C  citalopram (CELEXA) 20 MG tablet Take 1 tablet (20 mg total) by mouth daily. 10/05/18  Yes Terald Sleeper, PA-C  furosemide (LASIX) 20 MG tablet Take 1 tablet (20 mg total) by mouth every morning. Patient taking differently: Take 40 mg by mouth 2 (two) times daily.  01/03/19  Yes Terald Sleeper, PA-C  gabapentin (NEURONTIN) 300 MG capsule Take 3 capsules (900 mg total) by mouth 3 (three) times daily. Slowly increase as discussed. 10/05/18  Yes Terald Sleeper, PA-C  insulin aspart protamine- aspart (NOVOLOG MIX 70/30) (70-30) 100 UNIT/ML injection Inject 0.15-0.3 mLs (15-30 Units total) into the skin 3 (three) times daily. Patient taking differently: Inject 20 Units into the skin 3 (three) times daily.  03/09/18  Yes Terald Sleeper, PA-C  metFORMIN (GLUCOPHAGE) 1000 MG tablet Take 1 tablet (1,000 mg total) by mouth 2 (two) times daily with a meal. 10/05/18  Yes Terald Sleeper, PA-C  pantoprazole (PROTONIX) 40 MG tablet Take 1 tablet (40 mg total) by mouth daily. 10/05/18  Yes Terald Sleeper, PA-C  rOPINIRole (REQUIP) 0.5 MG tablet Take 1-2 tablets (0.5-1 mg total) by mouth at bedtime. 10/05/18  Yes Terald Sleeper, PA-C  sacubitril-valsartan (ENTRESTO) 49-51 MG Take 1 tablet by mouth 2 (two) times daily. 03/22/19  Yes BranchAlphonse Guild, MD  spironolactone (ALDACTONE) 25 MG tablet Take 0.5 tablets (12.5 mg total) by mouth daily. 03/22/19 03/21/20 Yes BranchAlphonse Guild, MD  azithromycin  (ZITHROMAX) 250 MG tablet 1 tablet daily starting Thursday evening. 03/30/19   Nat Christen, MD  glucose blood test strip Use as instructed Patient not taking: Reported on 03/30/2019 08/07/14   Wardell Honour, MD  Insulin Syringe-Needle U-100 (RELION INSULIN SYR 0.3ML/31G) 31G X 5/16" 0.3 ML MISC USE ONE SYRINGE [place] Patient not taking: Reported on 03/30/2019 10/05/18   Terald Sleeper, PA-C    Allergies    Patient has no known allergies.  Review of Systems   Review of Systems  All other systems reviewed and are negative.   Physical Exam Updated Vital Signs BP (!) 135/93   Pulse 66   Temp 98.2 F (36.8 C) (Oral)   Resp 18   Ht 5\' 11"  (1.803 m)   Wt 102 kg   SpO2 98%   BMI 31.36 kg/m   Physical Exam  Vitals and nursing note reviewed.  Constitutional:      Comments: No acute distress.  HENT:     Head: Normocephalic and atraumatic.  Eyes:     Conjunctiva/sclera: Conjunctivae normal.  Cardiovascular:     Rate and Rhythm: Normal rate and regular rhythm.  Pulmonary:     Effort: Pulmonary effort is normal.     Comments: No tachypnea or obvious dyspnea.  Bilateral expiratory wheezes. Abdominal:     General: Bowel sounds are normal.     Palpations: Abdomen is soft.  Musculoskeletal:        General: Normal range of motion.     Cervical back: Neck supple.  Skin:    General: Skin is warm and dry.  Neurological:     General: No focal deficit present.     Mental Status: He is alert and oriented to person, place, and time.  Psychiatric:        Behavior: Behavior normal.     ED Results / Procedures / Treatments   Labs (all labs ordered are listed, but only abnormal results are displayed) Labs Reviewed  POC SARS CORONAVIRUS 2 AG -  ED    EKG EKG Interpretation  Date/Time:  Wednesday March 30 2019 16:50:30 EST Ventricular Rate:  69 PR Interval:    QRS Duration: 122 QT Interval:  427 QTC Calculation: 458 R Axis:   64 Text  Interpretation: Sinus rhythm Nonspecific intraventricular conduction delay Abnormal T, consider ischemia, lateral leads Confirmed by Nat Christen [phone]) on 03/30/2019 6:27:43 PM   Radiology DG Chest Port 1 View  Result Date: 03/30/2019 CLINICAL DATA:  Shortness of breath for 1 day EXAM: PORTABLE CHEST 1 VIEW COMPARISON:  07/12/2018 FINDINGS: 2 frontal views of the chest demonstrates stable postsurgical changes from median sternotomy. The cardiac silhouette is unremarkable. No airspace disease, effusion, or pneumothorax. IMPRESSION: 1. Stable chest, no acute process. Electronically Signed   By: Randa Ngo M.D.   On: 03/30/2019 18:07    Procedures Procedures (including critical care time)  Medications Ordered in ED Medications  albuterol (VENTOLIN HFA) 108 (90 Base) MCG/ACT inhaler 2 puff (2 puffs Inhalation Given 03/30/19 2141)  chlorpheniramine-HYDROcodone (TUSSIONEX) 10-8 MG/5ML suspension 5 mL (5 mLs Oral Given 03/30/19 1819)  ipratropium-albuterol (DUONEB) 0.5-2.5 (3) MG/3ML nebulizer solution 3 mL (3 mLs Nebulization Given 03/30/19 2011)  dexamethasone (DECADRON) injection 6 mg (6 mg Intramuscular Given 03/30/19 2006)  doxycycline (VIBRA-TABS) tablet 100 mg (100 mg Oral Given 03/30/19 2141)    ED Course  I have reviewed the triage vital signs and the nursing notes.  Pertinent labs & imaging results that were available during my care of the patient were reviewed by me and considered in my medical decision making (see chart for details).    MDM Rules/Calculators/A&P                      Patient is not an extremis.  Chest x-ray and Covid test negative.  Intramuscular Decadron, Tussionex, DuoNeb breathing treatment, and doxycycline 100 mg given in the emergency department.  Discharge medications doxycycline and albuterol inhaler. Final Clinical Impression(s) / ED Diagnoses Final diagnoses:  COPD exacerbation ([place])    Rx / DC Orders ED Discharge Orders         Ordered     azithromycin (ZITHROMAX) 250 MG tablet     03/30/19 2141           Nat Christen, MD 03/30/19 2152

## 2019-03-30 NOTE — ED Triage Notes (Signed)
Patient presents to the ED via [facility] due to shortness of breath for one day.  EMS reports 02 sats at 95% on room air, CBG 380 with blood pressure at 132/70.  Patient denies fever or covid exposure.

## 2019-03-30 NOTE — Discharge Instructions (Addendum)
Chest x-ray and Covid test negative.  You were given steroids in the emergency department along with inhaler and your first dose of antibiotic.  Additional antibiotic called into your pharmacy.  Tylenol for fever pain.  Return if worse.

## 2019-03-31 ENCOUNTER — Other Ambulatory Visit: Payer: Medicare HMO

## 2019-03-31 NOTE — ED Provider Notes (Signed)
I received a call from pharmacy, [facility].  They were concerned about interaction between citalopram and azithromycin.  I changed the prescription to Bactrim DS, #14 PID.   Daleen Bo, MD 03/31/19 [phone]

## 2019-04-14 DIAGNOSIS — E119 Type 2 diabetes mellitus without complications: Secondary | ICD-10-CM | POA: Diagnosis not present

## 2019-04-14 DIAGNOSIS — I10 Essential (primary) hypertension: Secondary | ICD-10-CM | POA: Diagnosis not present

## 2019-04-14 DIAGNOSIS — Z20822 Contact with and (suspected) exposure to covid-19: Secondary | ICD-10-CM | POA: Diagnosis not present

## 2019-04-14 DIAGNOSIS — J449 Chronic obstructive pulmonary disease, unspecified: Secondary | ICD-10-CM | POA: Diagnosis not present

## 2019-04-14 DIAGNOSIS — K219 Gastro-esophageal reflux disease without esophagitis: Secondary | ICD-10-CM | POA: Diagnosis not present

## 2019-04-14 DIAGNOSIS — I509 Heart failure, unspecified: Secondary | ICD-10-CM | POA: Diagnosis not present

## 2019-04-14 DIAGNOSIS — Z951 Presence of aortocoronary bypass graft: Secondary | ICD-10-CM | POA: Diagnosis not present

## 2019-04-14 DIAGNOSIS — E1165 Type 2 diabetes mellitus with hyperglycemia: Secondary | ICD-10-CM | POA: Diagnosis not present

## 2019-04-14 DIAGNOSIS — I5033 Acute on chronic diastolic (congestive) heart failure: Secondary | ICD-10-CM | POA: Diagnosis not present

## 2019-04-14 DIAGNOSIS — R0602 Shortness of breath: Secondary | ICD-10-CM | POA: Diagnosis not present

## 2019-04-14 DIAGNOSIS — I110 Hypertensive heart disease with heart failure: Secondary | ICD-10-CM | POA: Diagnosis not present

## 2019-04-14 DIAGNOSIS — Z7982 Long term (current) use of aspirin: Secondary | ICD-10-CM | POA: Diagnosis not present

## 2019-04-14 DIAGNOSIS — I5030 Unspecified diastolic (congestive) heart failure: Secondary | ICD-10-CM | POA: Diagnosis not present

## 2019-04-15 ENCOUNTER — Telehealth: Payer: Self-pay | Admitting: *Deleted

## 2019-04-15 NOTE — Telephone Encounter (Signed)
    Transitional Care Management  Contact Attempt Attempt Date:04/15/2019 Attempted By:   1st unsuccessful TCM contact attempt.   I reached out to [name] on his preferred telephone number to discuss Transitional Care Management, medication reconciliation, and to schedule a TCM hospital follow-up with his PCP at [facility].  Discharge Date: 04/14/19 Location: [facility] Discharge Dx: unavailable  Recommendations for Outpatient Follow-up:   (insert from discharge summary)   Plan Voicemail was not set up on attempted phone number Will attempt to contact again within the 2 business day post discharge window if he does not return my call.

## 2019-04-18 ENCOUNTER — Telehealth: Payer: Self-pay | Admitting: *Deleted

## 2019-04-18 NOTE — Telephone Encounter (Signed)
    Transitional Care Management  Contact Attempt Attempt Date:04/18/2019 Attempted By: Eston Mould, LPN  2nd unsuccessful TCM contact attempt.   I reached out to Shane Frye on his preferred telephone number to discuss Transitional Care Management, medication reconciliation, and to schedule a TCM hospital follow-up with his PCP at [facility].  Discharge Date: 04/14/19 Location: [facility] Discharge Dx: unavailable  Recommendations for Outpatient Follow-up:  Unavailable-we currently don't have discharge summary   Plan I left a HIPPA compliant message for him to return my call.

## 2019-04-20 ENCOUNTER — Other Ambulatory Visit: Payer: Medicare HMO

## 2019-05-20 ENCOUNTER — Ambulatory Visit: Payer: Medicare HMO | Admitting: Cardiology

## 2019-05-20 ENCOUNTER — Encounter: Payer: Self-pay | Admitting: Cardiology

## 2019-05-20 NOTE — Progress Notes (Deleted)
Clinical Summary Shane Frye is a 54 y.o.male seen today for follow up of the following medical problems.  1. Chronic systolic HF/ICM/CAD -history of CAD with cath 2015 with severe 3 vessel CAD s/p CABG 10/2013 - new diagnosis of CHF during 06/2018 admission - 06/2018 echo LVEF 25-30%, restrictive diastolic filling, moderate RV dysfunction - 06/2018 RHC/LHC: LM patent, ostial LAD 85%, ramus 60%, ostial LCX 90%, OM1 100%, RCA distal occlusion. LIMA-LAD patent, occluded SVG-ramus, SVG-OM2, SVG-RPDA.  - CI 2.7, mean PA 42, PCWP 29.   - anatomy not amenable to PCI or repeat CABG. Recs for medical therapy per interventional cards - he was diuresed during admission, reported discharge weight 216 lbs     - isolated episode of SOB, seen in [facility] hospital about 1 month ago for HF. He had run out of his meds - no recurrent SOB. Mild edema. Back on meds and compliant     2. Posotp afib - discharge summary reports history of PAF, from chart review this was actually postop afib that occurred after CABGin 2015. - no noted recurrence. Has not been committed to long term anticoag.   3. Substance abuse - cocaine + during prior admission   4. COPD - treated for COPD exacerbation during 03/2019 ER visit    Past Medical History:  Diagnosis Date  . Anxiety   . COPD (chronic obstructive pulmonary disease) ([place])   . Depression   . Diabetes mellitus   . GERD (gastroesophageal reflux disease)   . High cholesterol   . Hypertension   . Insomnia   . Myocardial infarction ([place]) 11/04/13  . Neuropathy      No Known Allergies   Current Outpatient Medications  Medication Sig Dispense Refill  . albuterol (VENTOLIN HFA) 108 (90 Base) MCG/ACT inhaler Inhale 2 puffs into the lungs every 6 (six) hours as needed for wheezing or shortness of breath. 1 Inhaler 5  . aspirin 81 MG tablet Take 81 mg by mouth daily.    Marland Kitchen atorvastatin (LIPITOR) 80 MG tablet Take 1 tablet (80 mg  total) by mouth daily at 6 PM. 90 tablet 2  . carvedilol (COREG) 6.25 MG tablet Take 2 tablets (12.5 mg total) by mouth 2 (two) times daily. 360 tablet 3  . citalopram (CELEXA) 20 MG tablet Take 1 tablet (20 mg total) by mouth daily. 90 tablet 3  . doxycycline (VIBRAMYCIN) 100 MG capsule Take 1 capsule (100 mg total) by mouth 2 (two) times daily. 20 capsule 0  . furosemide (LASIX) 20 MG tablet Take 1 tablet (20 mg total) by mouth every morning. (Patient taking differently: Take 40 mg by mouth 2 (two) times daily. ) 90 tablet 2  . gabapentin (NEURONTIN) 300 MG capsule Take 3 capsules (900 mg total) by mouth 3 (three) times daily. Slowly increase as discussed. 810 capsule 3  . glucose blood test strip Use as instructed (Patient not taking: Reported on 03/30/2019) 100 each 12  . insulin aspart protamine- aspart (NOVOLOG MIX 70/30) (70-30) 100 UNIT/ML injection Inject 0.15-0.3 mLs (15-30 Units total) into the skin 3 (three) times daily. (Patient taking differently: Inject 20 Units into the skin 3 (three) times daily. ) 30 mL 5  . Insulin Syringe-Needle U-100 (RELION INSULIN SYR 0.3ML/31G) 31G X 5/16" 0.3 ML MISC USE ONE SYRINGE EACH TIME THREE TIMES DAILY (Patient not taking: Reported on 03/30/2019) 100 each 5  . metFORMIN (GLUCOPHAGE) 1000 MG tablet Take 1 tablet (1,000 mg total) by mouth 2 (two) times  daily with a meal. 180 tablet 3  . pantoprazole (PROTONIX) 40 MG tablet Take 1 tablet (40 mg total) by mouth daily. 90 tablet 3  . rOPINIRole (REQUIP) 0.5 MG tablet Take 1-2 tablets (0.5-1 mg total) by mouth at bedtime. 60 tablet 5  . sacubitril-valsartan (ENTRESTO) 49-51 MG Take 1 tablet by mouth 2 (two) times daily. 60 tablet 6  . spironolactone (ALDACTONE) 25 MG tablet Take 0.5 tablets (12.5 mg total) by mouth daily. 45 tablet 2   No current facility-administered medications for this visit.     Past Surgical History:  Procedure Laterality Date  . CARDIOVERSION N/A 11/06/2013   Procedure:  CARDIOVERSION;  Surgeon: Thayer Headings, MD;  Location: [place];  Service: Cardiovascular;  Laterality: N/A;  . CARPAL TUNNEL RELEASE  04/2015  . COLONOSCOPY N/A 05/11/2014   Procedure: COLONOSCOPY;  Surgeon: Rogene Houston, MD;  [facility];  Service: Endoscopy;  Laterality: N/A;  930  . CORONARY ARTERY BYPASS GRAFT N/A 11/09/2013   Procedure: CORONARY ARTERY BYPASS GRAFTING (CABG);  Surgeon: Melrose Nakayama, MD;  Location: [place];  Service: Open Heart Surgery;  Laterality: N/A;  . INTRAOPERATIVE TRANSESOPHAGEAL ECHOCARDIOGRAM N/A 11/09/2013   Procedure: INTRAOPERATIVE TRANSESOPHAGEAL ECHOCARDIOGRAM;  Surgeon: Melrose Nakayama, MD;  Location: [place];  Service: Open Heart Surgery;  Laterality: N/A;  . LEFT HEART CATHETERIZATION WITH CORONARY ANGIOGRAM N/A 11/04/2013   Procedure: LEFT HEART CATHETERIZATION WITH CORONARY ANGIOGRAM;  Surgeon: Troy Sine, MD;  Location: [facility] CATH LAB;  Service: Cardiovascular;  Laterality: N/A;  . NO PAST SURGERIES    . RIGHT/LEFT HEART CATH AND CORONARY/GRAFT ANGIOGRAPHY N/A 07/14/2018   Procedure: RIGHT/LEFT HEART CATH AND CORONARY/GRAFT ANGIOGRAPHY;  Surgeon: Wellington Hampshire, MD;  Location: [place] CV LAB;  Service: Cardiovascular;  Laterality: N/A;     No Known Allergies    Family History  Problem Relation Age of Onset  . Hypertension Paternal Grandfather   . Hypertension Brother   . Diabetes Brother   . Neuropathy Neg Hx      Social History Shane Frye reports that he quit smoking about 10 months ago. His smoking use included cigarettes. He smoked 0.50 packs per day. He quit smokeless tobacco use about 5 years ago. Shane Frye reports no history of alcohol use.   Review of Systems CONSTITUTIONAL: No weight loss, fever, chills, weakness or fatigue.  HEENT: Eyes: No visual loss, blurred vision, double vision or yellow sclerae.No hearing loss, sneezing, congestion, runny nose or sore throat.  SKIN: No rash or itching.    CARDIOVASCULAR:  RESPIRATORY: No shortness of breath, cough or sputum.  GASTROINTESTINAL: No anorexia, nausea, vomiting or diarrhea. No abdominal pain or blood.  GENITOURINARY: No burning on urination, no polyuria NEUROLOGICAL: No headache, dizziness, syncope, paralysis, ataxia, numbness or tingling in the extremities. No change in bowel or bladder control.  MUSCULOSKELETAL: No muscle, back pain, joint pain or stiffness.  LYMPHATICS: No enlarged nodes. No history of splenectomy.  PSYCHIATRIC: No history of depression or anxiety.  ENDOCRINOLOGIC: No reports of sweating, cold or heat intolerance. No polyuria or polydipsia.  Marland Kitchen   Physical Examination There were no vitals filed for this visit. There were no vitals filed for this visit.  Gen: resting comfortably, no acute distress HEENT: no scleral icterus, pupils equal round and reactive, no palptable cervical adenopathy,  CV Resp: Clear to auscultation bilaterally GI: abdomen is soft, non-tender, non-distended, normal bowel sounds, no hepatosplenomegaly MSK: extremities are warm, no edema.  Skin: warm,  no rash Neuro:  no focal deficits Psych: appropriate affect   Diagnostic Studies 06/2018 echo IMPRESSIONS   1. The left ventricle has severely reduced systolic function, with an ejection fraction of 25-30%. The cavity size was mildly dilated. There is mildly increased left ventricular wall thickness. Left ventricular diastolic Doppler parameters are  consistent with restrictive filling. Elevated mean left atrial pressure. 2. The right ventricle has moderately reduced systolic function. The cavity was normal. There is no increase in right ventricular wall thickness. 3. Left atrial size was severely dilated. 4. Right atrial size was mildly dilated. 5. No evidence of mitral valve stenosis. 6. The aortic valve has an indeterminate number of cusps. No stenosis of the aortic valve. 7. The aortic root is normal in size and  structure. 8. Pulmonary hypertension is indeterminant, inadequate TR jet. 9. The inferior vena cava was dilated in size with >50% respiratory variability.  06/2018 cath  Ost Ramus to Ramus lesion is 60% stenosed.  Ost Cx to Prox Cx lesion is 90% stenosed.  Mid Cx lesion is 80% stenosed.  Ost 1st Mrg lesion is 100% stenosed.  LIMA and is normal in caliber.  Prox RCA to Mid RCA lesion is 30% stenosed.  SVG.  Origin to Prox Graft lesion is 100% stenosed.  SVG.  Origin to Prox Graft lesion is 100% stenosed.  SVG.  Origin lesion is 100% stenosed.  Prox LAD lesion is 70% stenosed.  Ost LAD to Prox LAD lesion is 85% stenosed.  Dist RCA lesion is 100% stenosed.  1. Severe underlying three-vessel coronary artery disease with occluded vein grafts (SVG to OM, SVG to diagonal/ramus and SVG to right PDA/PL). The only patent graft is LIMA to LAD. The native coronary arteries are heavily calcified and diffusely diseased. 2. Left ventricular angiography was not performed. EF was severely reduced by echo at 20%. 3. Right heart catheterization showed moderately elevated filling pressures, moderate pulmonary hypertension and normal cardiac output.  Recommendations: Unfortunately, all vein grafts are occluded. The RCA is occluded distally with heavy calcifications. The left circumflex is heavily calcified and diffusely diseased. Both of these are not suitable for PCI. Also the distal branches are relatively small and likely not good targets for redo CABG. His best option is aggressive medical therapy. Regarding heart failure, he continues to be moderately volume overloaded with a wedge pressure of 29 mmHg. I resumed IV furosemide. I added losartan. Consider transitioning to [facility] and adding spironolactone.    Assessment and Plan  1. Chronic systolic HF/CAD/ICM -no current symptoms - repeat echo,pending results consider further titration of meds. Possible ICD  consideration.       Arnoldo Lenis, M.D.

## 2019-06-16 ENCOUNTER — Emergency Department (HOSPITAL_COMMUNITY)
Admission: EM | Admit: 2019-06-16 | Discharge: 2019-06-16 | Disposition: A | Discharge status: Home / Self Care | Payer: Medicare HMO | Attending: Emergency Medicine | Admitting: Emergency Medicine

## 2019-06-16 ENCOUNTER — Emergency Department (HOSPITAL_COMMUNITY): Payer: Medicare HMO

## 2019-06-16 ENCOUNTER — Encounter (HOSPITAL_COMMUNITY): Payer: Self-pay | Admitting: *Deleted

## 2019-06-16 ENCOUNTER — Other Ambulatory Visit: Payer: Self-pay

## 2019-06-16 DIAGNOSIS — I252 Old myocardial infarction: Secondary | ICD-10-CM | POA: Insufficient documentation

## 2019-06-16 DIAGNOSIS — Z7982 Long term (current) use of aspirin: Secondary | ICD-10-CM | POA: Insufficient documentation

## 2019-06-16 DIAGNOSIS — Z79899 Other long term (current) drug therapy: Secondary | ICD-10-CM | POA: Diagnosis not present

## 2019-06-16 DIAGNOSIS — I5043 Acute on chronic combined systolic (congestive) and diastolic (congestive) heart failure: Secondary | ICD-10-CM | POA: Diagnosis not present

## 2019-06-16 DIAGNOSIS — I10 Essential (primary) hypertension: Secondary | ICD-10-CM | POA: Insufficient documentation

## 2019-06-16 DIAGNOSIS — I480 Paroxysmal atrial fibrillation: Secondary | ICD-10-CM | POA: Diagnosis not present

## 2019-06-16 DIAGNOSIS — E119 Type 2 diabetes mellitus without complications: Secondary | ICD-10-CM | POA: Diagnosis not present

## 2019-06-16 DIAGNOSIS — Z87891 Personal history of nicotine dependence: Secondary | ICD-10-CM | POA: Diagnosis not present

## 2019-06-16 DIAGNOSIS — Z794 Long term (current) use of insulin: Secondary | ICD-10-CM | POA: Insufficient documentation

## 2019-06-16 DIAGNOSIS — I5042 Chronic combined systolic (congestive) and diastolic (congestive) heart failure: Secondary | ICD-10-CM | POA: Diagnosis not present

## 2019-06-16 DIAGNOSIS — I110 Hypertensive heart disease with heart failure: Secondary | ICD-10-CM | POA: Diagnosis not present

## 2019-06-16 DIAGNOSIS — R079 Chest pain, unspecified: Secondary | ICD-10-CM | POA: Diagnosis not present

## 2019-06-16 DIAGNOSIS — R609 Edema, unspecified: Secondary | ICD-10-CM | POA: Diagnosis not present

## 2019-06-16 DIAGNOSIS — Z760 Encounter for issue of repeat prescription: Secondary | ICD-10-CM

## 2019-06-16 DIAGNOSIS — R0602 Shortness of breath: Secondary | ICD-10-CM | POA: Diagnosis not present

## 2019-06-16 LAB — CBC
HCT: 42.4 % (ref 39.0–52.0)
Hemoglobin: 13.5 g/dL (ref 13.0–17.0)
MCH: 26.5 pg (ref 26.0–34.0)
MCHC: 31.8 g/dL (ref 30.0–36.0)
MCV: 83.3 fL (ref 80.0–100.0)
Platelets: 326 10*3/uL (ref 150–400)
RBC: 5.09 MIL/uL (ref 4.22–5.81)
RDW: 15.3 % (ref 11.5–15.5)
WBC: 10.5 10*3/uL (ref 4.0–10.5)
nRBC: 0 % (ref 0.0–0.2)

## 2019-06-16 LAB — BASIC METABOLIC PANEL
Anion gap: 10 (ref 5–15)
BUN: 15 mg/dL (ref 6–20)
CO2: 23 mmol/L (ref 22–32)
Calcium: 8.5 mg/dL — ABNORMAL LOW (ref 8.9–10.3)
Chloride: 97 mmol/L — ABNORMAL LOW (ref 98–111)
Creatinine, Ser: 0.69 mg/dL (ref 0.61–1.24)
GFR calc Af Amer: >60 mL/min (ref >60)
GFR calc non Af Amer: >60 mL/min (ref >60)
Glucose, Bld: 217 mg/dL — ABNORMAL HIGH (ref 70–99)
Potassium: 4.3 mmol/L (ref 3.5–5.1)
Sodium: 130 mmol/L — ABNORMAL LOW (ref 135–145)

## 2019-06-16 LAB — BRAIN NATRIURETIC PEPTIDE: B Natriuretic Peptide: 845 pg/mL — ABNORMAL HIGH (ref 0.0–100.0)

## 2019-06-16 LAB — TROPONIN I (HIGH SENSITIVITY)
Troponin I (High Sensitivity): 20 ng/L — ABNORMAL HIGH (ref <18)
Troponin I (High Sensitivity): 21 ng/L — ABNORMAL HIGH (ref <18)

## 2019-06-16 MED ORDER — SODIUM CHLORIDE 0.9% FLUSH: 3.0000 mL | Freq: Once | INTRAVENOUS | Status: DC

## 2019-06-16 MED ORDER — FUROSEMIDE 20 MG PO TABS: 20.0000 mg | ORAL_TABLET | Freq: Every day | ORAL | 0 refills | Status: DC

## 2019-06-16 MED ORDER — FUROSEMIDE 40 MG PO TABS
40.0000 mg | ORAL_TABLET | Freq: Once | ORAL | Status: AC
  Administered 2019-06-16: 20:00:00 40 mg via ORAL
  Filled 2019-06-16: qty 1

## 2019-06-16 NOTE — ED Notes (Signed)
Pt refused to sign d/c form. Ambulatory to waiting room.

## 2019-06-16 NOTE — Discharge Instructions (Addendum)
It is important that you take your medication daily as directed.  Call your primary provider to arrange a follow-up appointment for next week.  Return emergency department for any worsening symptoms.

## 2019-06-16 NOTE — ED Notes (Signed)
Pt upset that chronic medications are not being given to him while in the ED, both me and PA have explained to pt that is not the practice of the ED. Pt requesting that we provide him with food and something to drink, explained to pt that once his results came back I would ask the provider. Pt yelling and screaming at this RN that he only came here to get food due to not having any money.

## 2019-06-16 NOTE — ED Triage Notes (Signed)
Pt states he ran out of his lasix x 4 days ago and started having sob with swelling to his legs and abdomen that started yesterday

## 2019-06-16 NOTE — ED Provider Notes (Signed)
[facility] EMERGENCY DEPARTMENT Provider Note   CSN: [id number] Arrival date & time: 06/16/19  1645     History Chief Complaint  Patient presents with  . Shortness of Breath    Shane Frye is a 54 y.o. male.  HPI      Shane Frye is a 54 y.o. male with past medical history significant for COPD, CHF, diabetes, hypertension and neuropathy who presents to the Emergency Department requesting refill of his Lasix.  He states that he ran out of his medication 4 days ago and has not been able to refill his medication due to finances.  He also reports gradually increasing shortness of breath and swelling to his legs and abdomen.  He states the symptoms started yesterday.  He denies chest pain, fever, cough, or known Covid exposures.  Nothing makes his symptoms better, he reports increased shortness of breath with exertion.  Past Medical History:  Diagnosis Date  . Anxiety   . COPD (chronic obstructive pulmonary disease) ([place])   . Depression   . Diabetes mellitus   . GERD (gastroesophageal reflux disease)   . High cholesterol   . Hypertension   . Insomnia   . Myocardial infarction ([place]) 11/04/13  . Neuropathy     Patient Active Problem List   Diagnosis Date Noted  . Acute on chronic combined systolic and diastolic CHF (congestive heart failure) ([place]) 07/14/2018  . Hyperosmolar non-ketotic state in patient with type 2 diabetes mellitus ([place]) 07/12/2018  . Pain management contract agreement 12/27/2015  . Lumbar disc disease with radiculopathy 11/27/2015  . OSA (obstructive sleep apnea) 07/21/2015  . Hypertension   . GERD (gastroesophageal reflux disease)   . Uncontrolled diabetes mellitus with circulatory complication ([place])   . Insomnia   . Neuropathy   . Rotator cuff tear 01/30/2014  . Dyspnea on exertion 12/26/2013  . Smoker 12/26/2013  . PAF (paroxysmal atrial fibrillation) ([place]) 12/26/2013  . 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 HLD (hyperlipidemia) 11/04/2013  . Obesity  11/04/2013  . Cocaine abuse ([place]) 11/04/2013  . H/O noncompliance with medical treatment, presenting hazards to health 11/04/2013  . CAD (coronary artery disease), native coronary artery 11/04/2013    Past Surgical History:  Procedure Laterality Date  . CARDIOVERSION N/A 11/06/2013   Procedure: CARDIOVERSION;  Surgeon: Thayer Headings, MD;  Location: [place];  Service: Cardiovascular;  Laterality: N/A;  . CARPAL TUNNEL RELEASE  04/2015  . COLONOSCOPY N/A 05/11/2014   Procedure: COLONOSCOPY;  Surgeon: Rogene Houston, MD;  [facility];  Service: Endoscopy;  Laterality: N/A;  930  . CORONARY ARTERY BYPASS GRAFT N/A 11/09/2013   Procedure: CORONARY ARTERY BYPASS GRAFTING (CABG);  Surgeon: Melrose Nakayama, MD;  Location: [place];  Service: Open Heart Surgery;  Laterality: N/A;  . INTRAOPERATIVE TRANSESOPHAGEAL ECHOCARDIOGRAM N/A 11/09/2013   Procedure: INTRAOPERATIVE TRANSESOPHAGEAL ECHOCARDIOGRAM;  Surgeon: Melrose Nakayama, MD;  Location: [place];  Service: Open Heart Surgery;  Laterality: N/A;  . LEFT HEART CATHETERIZATION WITH CORONARY ANGIOGRAM N/A 11/04/2013   Procedure: LEFT HEART CATHETERIZATION WITH CORONARY ANGIOGRAM;  Surgeon: Troy Sine, MD;  Location: [facility] CATH LAB;  Service: Cardiovascular;  Laterality: N/A;  . NO PAST SURGERIES    . RIGHT/LEFT HEART CATH AND CORONARY/GRAFT ANGIOGRAPHY N/A 07/14/2018   Procedure: RIGHT/LEFT HEART CATH AND CORONARY/GRAFT ANGIOGRAPHY;  Surgeon: Wellington Hampshire, MD;  Location: [place] CV LAB;  Service: Cardiovascular;  Laterality: N/A;       Family History  Problem  Relation Age of Onset  . Hypertension Paternal Grandfather   . Hypertension Brother   . Diabetes Brother   . Neuropathy Neg Hx     Social History   Tobacco Use  . Smoking status: Former Smoker    Packs/day: 0.50    Types: Cigarettes    Quit date: 07/16/2018    Years since quitting: 0.9  . Smokeless tobacco: Former Systems developer    Quit date: 11/04/2013  . Tobacco  comment: reports quitting 07/16/2018  Substance Use Topics  . Alcohol use: No    Alcohol/week: 2.0 standard drinks    Types: 2 Cans of beer per week  . Drug use: No    Comment: cocaine over 3 years ago    Home Medications Prior to Admission medications   Medication Sig Start Date End Date Taking? Authorizing Provider  albuterol (VENTOLIN HFA) 108 (90 Base) MCG/ACT inhaler Inhale 2 puffs into the lungs every 6 (six) hours as needed for wheezing or shortness of breath. 06/29/18  Yes Terald Sleeper, PA-C  aspirin 81 MG tablet Take 81 mg by mouth daily.   Yes [provider]  atorvastatin (LIPITOR) 80 MG tablet Take 1 tablet (80 mg total) by mouth daily at 6 PM. 03/22/19  Yes Branch, Alphonse Guild, MD  carvedilol (COREG) 6.25 MG tablet Take 2 tablets (12.5 mg total) by mouth 2 (two) times daily. 10/05/18  Yes Terald Sleeper, PA-C  citalopram (CELEXA) 20 MG tablet Take 1 tablet (20 mg total) by mouth daily. 10/05/18  Yes Terald Sleeper, PA-C  furosemide (LASIX) 20 MG tablet Take 1 tablet (20 mg total) by mouth every morning. Patient taking differently: Take 20 mg by mouth daily.  01/03/19  Yes Terald Sleeper, PA-C  gabapentin (NEURONTIN) 300 MG capsule Take 3 capsules (900 mg total) by mouth 3 (three) times daily. Slowly increase as discussed. 10/05/18  Yes Terald Sleeper, PA-C  insulin aspart protamine- aspart (NOVOLOG MIX 70/30) (70-30) 100 UNIT/ML injection Inject 0.15-0.3 mLs (15-30 Units total) into the skin 3 (three) times daily. Patient taking differently: Inject 20 Units into the skin 3 (three) times daily.  03/09/18  Yes Terald Sleeper, PA-C  metFORMIN (GLUCOPHAGE) 1000 MG tablet Take 1 tablet (1,000 mg total) by mouth 2 (two) times daily with a meal. 10/05/18  Yes Terald Sleeper, PA-C  rOPINIRole (REQUIP) 0.5 MG tablet Take 1-2 tablets (0.5-1 mg total) by mouth at bedtime. 10/05/18  Yes Terald Sleeper, PA-C  sacubitril-valsartan (ENTRESTO) 49-51 MG Take 1 tablet by mouth 2 (two) times  daily. 03/22/19  Yes BranchAlphonse Guild, MD  spironolactone (ALDACTONE) 25 MG tablet Take 0.5 tablets (12.5 mg total) by mouth daily. 03/22/19 03/21/20 Yes BranchAlphonse Guild, MD  doxycycline (VIBRAMYCIN) 100 MG capsule Take 1 capsule (100 mg total) by mouth 2 (two) times daily. 03/30/19   Nat Christen, MD  glucose blood test strip Use as instructed Patient not taking: Reported on 03/30/2019 08/07/14   Wardell Honour, MD  Insulin Syringe-Needle U-100 (RELION INSULIN SYR 0.3ML/31G) 31G X 5/16" 0.3 ML MISC USE ONE SYRINGE [place] Patient not taking: Reported on 03/30/2019 10/05/18   Terald Sleeper, PA-C  pantoprazole (PROTONIX) 40 MG tablet Take 1 tablet (40 mg total) by mouth daily. 10/05/18   Terald Sleeper, PA-C    Allergies    Patient has no known allergies.  Review of Systems   Review of Systems  Constitutional: Negative for chills and fever.  Respiratory: Positive for shortness of breath. Negative for cough, chest tightness and wheezing.   Cardiovascular: Positive for leg swelling. Negative for chest pain.  Gastrointestinal: Negative for abdominal pain, nausea and vomiting.  Musculoskeletal: Negative for arthralgias and myalgias.  Skin: Negative for color change and rash.  Neurological: Negative for dizziness, syncope and numbness.  Psychiatric/Behavioral: Negative for confusion.    Physical Exam Updated Vital Signs BP (!) 120/95 (BP Location: Right Arm)   Pulse 71   Temp 98 F (36.7 C) (Oral)   Resp 18   Ht 5\' 9"  (1.753 m)   Wt 108.9 kg   SpO2 98%   BMI 35.44 kg/m   Physical Exam Vitals and nursing note reviewed.  Constitutional:      General: He is not in acute distress.    Appearance: He is well-developed. He is not ill-appearing or toxic-appearing.  Neck:     Vascular: No JVD.  Cardiovascular:     Rate and Rhythm: Normal rate and regular rhythm.     Pulses: Normal pulses.  Pulmonary:     Effort: Pulmonary effort is normal. No respiratory distress.      Breath sounds: Normal breath sounds.  Chest:     Chest wall: No edema.  Musculoskeletal:     Cervical back: Normal range of motion.     Right lower leg: No edema.     Left lower leg: No edema.  Skin:    General: Skin is warm.     Capillary Refill: Capillary refill takes less than 2 seconds.     Findings: No erythema or rash.  Neurological:     General: No focal deficit present.     ED Results / Procedures / Treatments   Labs (all labs ordered are listed, but only abnormal results are displayed) Labs Reviewed  BASIC METABOLIC PANEL - Abnormal; Notable for the following components:      Result Value   Sodium 130 (*)    Chloride 97 (*)    Glucose, Bld 217 (*)    Calcium 8.5 (*)    All other components within normal limits  BRAIN NATRIURETIC PEPTIDE - Abnormal; Notable for the following components:   B Natriuretic Peptide 845.0 (*)    All other components within normal limits  TROPONIN I (HIGH SENSITIVITY) - Abnormal; Notable for the following components:   Troponin I (High Sensitivity) 20 (*)    All other components within normal limits  TROPONIN I (HIGH SENSITIVITY) - Abnormal; Notable for the following components:   Troponin I (High Sensitivity) 21 (*)    All other components within normal limits  CBC    EKG EKG Interpretation  Date/Time:  Thursday June 16 2019 18:10:31 EDT Ventricular Rate:  70 PR Interval:  182 QRS Duration: 114 QT Interval:  444 QTC Calculation: 479 R Axis:   73 Text Interpretation: Normal sinus rhythm Possible Anterior infarct , age undetermined ST & T wave abnormality, consider lateral ischemia Abnormal ECG No significant change since last tracing Confirmed by Isla Pence [id number]) on 06/17/2019 2:50:28 PM    ekg reviewed by Dr. Roderic Palau Radiology DG Chest 2 View  Result Date: 06/16/2019 CLINICAL DATA:  Chest pain. Shortness of breath. EXAM: CHEST - 2 VIEW COMPARISON:  04/14/2019 FINDINGS: Median sternotomy. Heart size is UPPER normal  and unchanged. There is mild pulmonary vascular congestion. Early Kerley B-lines are present. No focal consolidations or pleural effusions. Mild degenerative changes in the thoracic spine. IMPRESSION: Mild pulmonary vascular congestion. Suspect early interstitial edema.  Electronically Signed   By: Nolon Nations M.D.   On: 06/16/2019 18:35    Procedures Procedures (including critical care time)  Medications Ordered in ED Medications  sodium chloride flush (NS) 0.9 % injection 3 mL (3 mLs Intravenous Not Given 06/16/19 1914)  furosemide (LASIX) tablet 40 mg (40 mg Oral Given 06/16/19 1943)    ED Course  I have reviewed the triage vital signs and the nursing notes.  Pertinent labs & imaging results that were available during my care of the patient were reviewed by me and considered in my medical decision making (see chart for details).    MDM Rules/Calculators/A&P                      Patient with history of CHF.  He has been out of his Lasix for 4 days.  He here shortness of breath and reported swelling of his legs and abdomen.  He is well-appearing and nontoxic.  No hypoxia or tachycardia.  No respiratory distress noted in the lungs sound clear on my exam.  EKG reviewed by Dr. Roderic Palau and care plan discussed.  Patient is here with dyspnea without chest pain.  Doubt cardiac process or PE.  Likely mild exacerbation of his chronic CHF due to not having his Lasix.  He reports feeling better after diuretic given here and he is requesting discharge home and refill of his medication   Final Clinical Impression(s) / ED Diagnoses Final diagnoses:  Medication refill  Chronic combined systolic and diastolic congestive heart failure [facility])    Rx / DC Orders ED Discharge Orders    None       Kem Parkinson, PA-C 06/18/19 0146    Milton Ferguson, MD 06/19/19 [phone]

## 2019-06-16 NOTE — ED Notes (Signed)
Pt ambulatory with no difficulty or distress noted at this time. Pt states that he is ready to go.

## 2019-06-28 DIAGNOSIS — I5033 Acute on chronic diastolic (congestive) heart failure: Secondary | ICD-10-CM | POA: Diagnosis not present

## 2019-06-28 DIAGNOSIS — E119 Type 2 diabetes mellitus without complications: Secondary | ICD-10-CM | POA: Diagnosis not present

## 2019-06-28 DIAGNOSIS — E1165 Type 2 diabetes mellitus with hyperglycemia: Secondary | ICD-10-CM | POA: Diagnosis not present

## 2019-06-28 DIAGNOSIS — J449 Chronic obstructive pulmonary disease, unspecified: Secondary | ICD-10-CM | POA: Diagnosis not present

## 2019-06-28 DIAGNOSIS — Z590 Homelessness: Secondary | ICD-10-CM | POA: Diagnosis not present

## 2019-06-28 DIAGNOSIS — I110 Hypertensive heart disease with heart failure: Secondary | ICD-10-CM | POA: Diagnosis not present

## 2019-06-28 DIAGNOSIS — R0989 Other specified symptoms and signs involving the circulatory and respiratory systems: Secondary | ICD-10-CM | POA: Diagnosis not present

## 2019-06-28 DIAGNOSIS — I5021 Acute systolic (congestive) heart failure: Secondary | ICD-10-CM | POA: Diagnosis not present

## 2019-06-28 DIAGNOSIS — Z951 Presence of aortocoronary bypass graft: Secondary | ICD-10-CM | POA: Diagnosis not present

## 2019-06-28 DIAGNOSIS — Z20822 Contact with and (suspected) exposure to covid-19: Secondary | ICD-10-CM | POA: Diagnosis not present

## 2019-06-28 DIAGNOSIS — I2510 Atherosclerotic heart disease of native coronary artery without angina pectoris: Secondary | ICD-10-CM | POA: Diagnosis not present

## 2019-06-28 DIAGNOSIS — Z7901 Long term (current) use of anticoagulants: Secondary | ICD-10-CM | POA: Diagnosis not present

## 2019-06-28 DIAGNOSIS — I509 Heart failure, unspecified: Secondary | ICD-10-CM | POA: Diagnosis not present

## 2019-06-28 DIAGNOSIS — I10 Essential (primary) hypertension: Secondary | ICD-10-CM | POA: Diagnosis not present

## 2019-06-28 DIAGNOSIS — F17210 Nicotine dependence, cigarettes, uncomplicated: Secondary | ICD-10-CM | POA: Diagnosis not present

## 2019-06-28 DIAGNOSIS — F17200 Nicotine dependence, unspecified, uncomplicated: Secondary | ICD-10-CM | POA: Diagnosis not present

## 2019-06-28 DIAGNOSIS — R0602 Shortness of breath: Secondary | ICD-10-CM | POA: Diagnosis not present

## 2019-06-29 DIAGNOSIS — I509 Heart failure, unspecified: Secondary | ICD-10-CM | POA: Diagnosis not present

## 2019-06-30 ENCOUNTER — Telehealth: Payer: Self-pay | Admitting: *Deleted

## 2019-06-30 NOTE — Telephone Encounter (Signed)
    Transitional Care Management  Contact Attempt Attempt Date:06/30/2019 Attempted By: Eston Mould, LPN  1st unsuccessful TCM contact attempt.   I reached out to Shane Frye on his preferred telephone number to discuss Transitional Care Management, medication reconciliation, and to schedule a TCM hospital follow-up with his PCP at [facility].  Discharge Date: 06/29/19 Location: [facility] Discharge Dx: Acute on Chronic CHF  Recommendations for Outpatient Follow-up:  (insert from discharge summary) Not available   Plan I attempted to contact pt to schedule pt but pt didn't answer and couldn't leave a message. Will attempt to contact again within the 2 business day post discharge window if he does not return my call.

## 2019-07-01 ENCOUNTER — Encounter: Payer: Self-pay | Admitting: *Deleted

## 2019-07-01 NOTE — Telephone Encounter (Signed)
    Transitional Care Management  Contact Attempt Attempt Date:07/01/2019 Attempted By: Truett Mainland, LPN  2nd unsuccessful TCM contact attempt.   I reached out to Shane Frye on his preferred telephone number to discuss Transitional Care Management, medication reconciliation, and to schedule a TCM hospital follow-up with his PCP at [facility].  Discharge Date: 06/29/2019 Location: [facility] Discharge Dx: Acute on Chronic CHF  Recommendations for Outpatient Follow-up:  NOT AVAILABLE (insert from discharge summary)   Plan I left a HIPPA compliant message for him to return my call.  Will attempt to contact again within the 2 business day post discharge window if he does not return my call.

## 2019-07-03 NOTE — Progress Notes (Addendum)
Cardiology Office Note  Date: 07/04/2019   ID: Shane Frye, DOB July 29, 1965, MRN [id number]  PCP:  Caryl Bis, MD  Cardiologist:  Carlyle Dolly, MD Electrophysiologist:  None   Chief Complaint: Chronic systolic HF/ICM/CAD, HTN, HLD, COPD, H/O substance abuse  History of Present Illness: Shane Frye is a 54 y.o. male with a history of Chronic systolic HF/ICM/CAD, postoperative period, substance abuse(cocaine), COPD, HTN,DM.  He was homeless living in his car. States he has a place to stay now.  Severe 3 vessel CAD: s/p 6 vessel CAG 10/2013 CHF 06/2018 admit [facility] 06/2018: LM patent, ostial LAD 85%, ramus 60%, ostial LCX 90%, OM1 100%, RCA distal occlusion. LIMA-LAD patent, occluded SVG-ramus, SVG-OM2, SVG-RPDA  [facility] ED visit 06/16/2019: SOB, legs swelling. Ran out of Lasix medication 4 days prior.   Admit [facility] 06/29/2019 Acute on chronic heart failure. BNP 4761.  Admitted for diuresis and treatment of CHF exacerbation.  Echo [id number] showed diastolic dysfunction with EF of 25%.  Patient states he has been doing well since discharge from the hospital after recent CHF exacerbation.  He states he ran out of his Lasix medication and now has a refill.  He states he feels much better and has lost weight.  Denies any lower extremity swelling, orthopnea, PND.  He states he does not have a means to weigh himself.  He does state he has a place to stay now and scales may be available to weigh himself every day.   Past Medical History:  Diagnosis Date  . Anxiety   . COPD (chronic obstructive pulmonary disease) ([place])   . Depression   . Diabetes mellitus   . GERD (gastroesophageal reflux disease)   . High cholesterol   . Hypertension   . Insomnia   . Myocardial infarction ([place]) 11/04/13  . Neuropathy     Past Surgical History:  Procedure Laterality Date  . CARDIOVERSION N/A 11/06/2013   Procedure: CARDIOVERSION;  Surgeon: Thayer Headings, MD;  Location: [facility];   Service: Cardiovascular;  Laterality: N/A;  . CARPAL TUNNEL RELEASE  04/2015  . COLONOSCOPY N/A 05/11/2014   Procedure: COLONOSCOPY;  Surgeon: Rogene Houston, MD;  [facility];  Service: Endoscopy;  Laterality: N/A;  930  . CORONARY ARTERY BYPASS GRAFT N/A 11/09/2013   Procedure: CORONARY ARTERY BYPASS GRAFTING (CABG);  Surgeon: Melrose Nakayama, MD;  Location: [facility];  Service: Open Heart Surgery;  Laterality: N/A;  . INTRAOPERATIVE TRANSESOPHAGEAL ECHOCARDIOGRAM N/A 11/09/2013   Procedure: INTRAOPERATIVE TRANSESOPHAGEAL ECHOCARDIOGRAM;  Surgeon: Melrose Nakayama, MD;  Location: [facility];  Service: Open Heart Surgery;  Laterality: N/A;  . LEFT HEART CATHETERIZATION WITH CORONARY ANGIOGRAM N/A 11/04/2013   Procedure: LEFT HEART CATHETERIZATION WITH CORONARY ANGIOGRAM;  Surgeon: Troy Sine, MD;  Location: [facility] CATH LAB;  Service: Cardiovascular;  Laterality: N/A;  . NO PAST SURGERIES    . RIGHT/LEFT HEART CATH AND CORONARY/GRAFT ANGIOGRAPHY N/A 07/14/2018   Procedure: RIGHT/LEFT HEART CATH AND CORONARY/GRAFT ANGIOGRAPHY;  Surgeon: Wellington Hampshire, MD;  Location: [facility] CV LAB;  Service: Cardiovascular;  Laterality: N/A;    Current Outpatient Medications  Medication Sig Dispense Refill  . aspirin 81 MG tablet Take 81 mg by mouth daily.    Marland Kitchen atorvastatin (LIPITOR) 80 MG tablet Take 1 tablet (80 mg total) by mouth daily at 6 PM. 90 tablet 2  . carvedilol (COREG) 6.25 MG tablet Take 2 tablets (12.5 mg total) by mouth 2 (two) times daily.  360 tablet 3  . citalopram (CELEXA) 20 MG tablet Take 1 tablet (20 mg total) by mouth daily. 90 tablet 3  . furosemide (LASIX) 20 MG tablet Take 1 tablet (20 mg total) by mouth daily. 30 tablet 0  . glucose blood test strip Use as instructed 100 each 12  . insulin aspart protamine- aspart (NOVOLOG MIX 70/30) (70-30) 100 UNIT/ML injection Inject 0.15-0.3 mLs (15-30 Units total) into the skin 3 (three) times daily. (Patient taking differently:  Inject 20 Units into the skin 3 (three) times daily. ) 30 mL 5  . Insulin Syringe-Needle U-100 (RELION INSULIN SYR 0.3ML/31G) 31G X 5/16" 0.3 ML MISC USE ONE SYRINGE EACH TIME THREE TIMES DAILY 100 each 5  . metFORMIN (GLUCOPHAGE) 1000 MG tablet Take 1 tablet (1,000 mg total) by mouth 2 (two) times daily with a meal. 180 tablet 3  . pantoprazole (PROTONIX) 40 MG tablet Take 1 tablet (40 mg total) by mouth daily. 90 tablet 3  . rOPINIRole (REQUIP) 0.5 MG tablet Take 1-2 tablets (0.5-1 mg total) by mouth at bedtime. 60 tablet 5  . sacubitril-valsartan (ENTRESTO) 49-51 MG Take 1 tablet by mouth 2 (two) times daily. 60 tablet 6  . spironolactone (ALDACTONE) 25 MG tablet Take 0.5 tablets (12.5 mg total) by mouth daily. 45 tablet 2  . albuterol (VENTOLIN HFA) 108 (90 Base) MCG/ACT inhaler Inhale 2 puffs into the lungs every 6 (six) hours as needed for wheezing or shortness of breath. 1 Inhaler 5   No current facility-administered medications for this visit.   Allergies:  Patient has no known allergies.   Social History: The patient  reports that he quit smoking about a year ago. His smoking use included cigarettes. He smoked 0.50 packs per day. He quit smokeless tobacco use about 5 years ago. He reports that he does not drink alcohol or use drugs.   Family History: The patient's family history includes Diabetes in his brother; Hypertension in his brother and paternal grandfather.   ROS:  Please see the history of present illness. Otherwise, complete review of systems is positive for none.  All other systems are reviewed and negative.   Physical Exam: VS:  BP 138/68   Pulse 89   Ht 5\' 9"  (1.753 m)   Wt 239 lb (108.4 kg)   SpO2 98%   BMI 35.29 kg/m , BMI Body mass index is 35.29 kg/m.  Wt Readings from Last 3 Encounters:  07/04/19 239 lb (108.4 kg)  06/16/19 240 lb (108.9 kg)  03/30/19 224 lb 13.9 oz (102 kg)    General: Patient appears comfortable at rest. Neck: Supple, no elevated JVP or  carotid bruits, no thyromegaly. Lungs: Clear to auscultation, nonlabored breathing at rest. Cardiac: Regular rate and rhythm, no S3 or significant systolic murmur, no pericardial rub. Extremities: No pitting edema, distal pulses 2+. Skin: Warm and dry. Musculoskeletal: No kyphosis. Neuropsychiatric: Alert and oriented x3, affect grossly appropriate.  ECG:  An ECG dated 06/30/2019 was personally reviewed today and demonstrated:  Normal sinus rhythm rate of 71 minimal voltage criteria for LVH, possible anterior infarct, age undetermined, ST and T wave abnormality consider lateral ischemia,.  Compared to EKG 04/14/2019 left ventricular hypertrophy now present.  This EKG was performed [facility]  Recent Labwork: 07/13/2018: Magnesium 2.3 06/16/2019: B Natriuretic Peptide 845.0; BUN 15; Creatinine, Ser 0.69; Hemoglobin 13.5; Platelets 326; Potassium 4.3; Sodium 130     Component Value Date/Time   CHOL 147 03/09/2018 0859   TRIG 113 03/09/2018 0859  HDL 40 03/09/2018 0859   CHOLHDL 3.7 03/09/2018 0859   CHOLHDL 3.4 11/06/2013 0300   VLDL 39 11/06/2013 0300   LDLCALC 84 03/09/2018 0859    Other Studies Reviewed Today:  06/29/2019 Echo Summary  1. Technically difficult study due to chest wall/lung interference and body habitus.  2. Echo contrast utilized to enhance endocardial border definition.  3. The left ventricle is moderately to severely dilated in size with mildly increased wall thickness.  4. The left ventricular systolic function is severely decreased, LVEF is visually estimated at ~25%.  5. There is grade II diastolic dysfunction (elevated filling pressure).  6. The left atrium is moderately dilated in size.  7. The right ventricle is normal in size, with mildly reduced systolic function.   06/2018 echo IMPRESSIONS 1. The left ventricle has severely reduced systolic function, with an ejection fraction of 25-30%. The cavity size was mildly dilated. There is mildly  increased left ventricular wall thickness. Left ventricular diastolic Doppler parameters are  consistent with restrictive filling. Elevated mean left atrial pressure. 2. The right ventricle has moderately reduced systolic function. The cavity was normal. There is no increase in right ventricular wall thickness. 3. Left atrial size was severely dilated. 4. Right atrial size was mildly dilated. 5. No evidence of mitral valve stenosis. 6. The aortic valve has an indeterminate number of cusps. No stenosis of the aortic valve. 7. The aortic root is normal in size and structure. 8. Pulmonary hypertension is indeterminant, inadequate TR jet. 9. The inferior vena cava was dilated in size with >50% respiratory variability.  06/2018 cath  Ost Ramus to Ramus lesion is 60% stenosed.  Ost Cx to Prox Cx lesion is 90% stenosed.  Mid Cx lesion is 80% stenosed.  Ost 1st Mrg lesion is 100% stenosed.  LIMA and is normal in caliber.  Prox RCA to Mid RCA lesion is 30% stenosed.  SVG.  Origin to Prox Graft lesion is 100% stenosed.  SVG.  Origin to Prox Graft lesion is 100% stenosed.  SVG.  Origin lesion is 100% stenosed.  Prox LAD lesion is 70% stenosed.  Ost LAD to Prox LAD lesion is 85% stenosed.  Dist RCA lesion is 100% stenosed.  1. Severe underlying three-vessel coronary artery disease with occluded vein grafts (SVG to OM, SVG to diagonal/ramus and SVG to right PDA/PL). The only patent graft is LIMA to LAD. The native coronary arteries are heavily calcified and diffusely diseased. 2. Left ventricular angiography was not performed. EF was severely reduced by echo at 20%. 3. Right heart catheterization showed moderately elevated filling pressures, moderate pulmonary hypertension and normal cardiac output.  Recommendations: Unfortunately, all vein grafts are occluded. The RCA is occluded distally with heavy calcifications. The left circumflex is heavily calcified and  diffusely diseased. Both of these are not suitable for PCI. Also the distal branches are relatively small and likely not good targets for redo CABG. His best option is aggressive medical therapy. Regarding heart failure, he continues to be moderately volume overloaded with a wedge pressure of 29 mmHg. I resumed IV furosemide. I added losartan. Consider transitioning to [facility] and adding spironolactone.  Assessment and Plan:  1. Chronic systolic heart failure ([place])   2. Coronary artery disease involving native coronary artery of native heart without angina pectoris   3. Essential hypertension   4. PAF (paroxysmal atrial fibrillation) ([place])   5. Mixed hyperlipidemia    1. Chronic systolic heart failure [facility] Recent admission to [facility] for CHF exacerbation.  He was diuresed and discharged.  Echocardiogram performed during hospital stay showed grade 2 diastolic dysfunction with EF of around 25%.  Continue Lasix 20 mg daily.  May take an extra dose if weight gain 3 pounds in 1 day or 5 pounds in 1 week.  Continue carvedilol 12.5 mg p.o. twice daily.  Continue Entresto 49/51 mg p.o. twice daily.  Continue spironolactone 12.5 mg daily.  Advised fluid restrictions of 60 ounces per day, restrict salt intake.  Advised strict adherence to medical therapy.  Patient verbalizes understanding.  2. Coronary artery disease involving native coronary artery of native heart without angina pectoris Status post 6 vessel bypass 2006.  No recent anginal or exertional symptoms.  .  Continue aspirin 81 mg daily,  3. Essential hypertension Blood pressure 138/68 today.  Continue carvedilol 12.5 mg p.o. twice daily, Lasix 20 mg daily.  4. PAF (paroxysmal atrial fibrillation) ([facility]) No recent episodes of palpitations or arrhythmias.  Continue carvedilol 12.5 mg p.o. twice daily.  5. Mixed hyperlipidemia Last lipid panel showed total cholesterol 147, triglycerides 113, HDL 40, LDL 84.  Not at goal.  Continue  atorvastatin 80 mg p.o. daily.  Medication Adjustments/Labs and Tests Ordered: Current medicines are reviewed at length with the patient today.  Concerns regarding medicines are outlined above.   Disposition: Follow-up with Dr. Harl Bowie 2 months Signed, Levell July, NP 07/04/2019 8:11 AM    [facility] [address], [place], [place] [address] Phone: [phone]; Fax: [phone]

## 2019-07-04 ENCOUNTER — Encounter: Payer: Self-pay | Admitting: Family Medicine

## 2019-07-04 ENCOUNTER — Ambulatory Visit (INDEPENDENT_AMBULATORY_CARE_PROVIDER_SITE_OTHER): Payer: Medicare HMO | Admitting: Family Medicine

## 2019-07-04 ENCOUNTER — Other Ambulatory Visit: Payer: Self-pay

## 2019-07-04 VITALS — BP 138/68 | HR 89 | Ht 69.0 in | Wt 239.0 lb

## 2019-07-04 DIAGNOSIS — I5022 Chronic systolic (congestive) heart failure: Secondary | ICD-10-CM

## 2019-07-04 DIAGNOSIS — I480 Paroxysmal atrial fibrillation: Secondary | ICD-10-CM

## 2019-07-04 DIAGNOSIS — E782 Mixed hyperlipidemia: Secondary | ICD-10-CM | POA: Diagnosis not present

## 2019-07-04 DIAGNOSIS — I2510 Atherosclerotic heart disease of native coronary artery without angina pectoris: Secondary | ICD-10-CM | POA: Diagnosis not present

## 2019-07-04 DIAGNOSIS — I10 Essential (primary) hypertension: Secondary | ICD-10-CM

## 2019-07-04 NOTE — Patient Instructions (Signed)
Your physician recommends that you schedule a follow-up appointment in: 2 MONTHS WITH DR BRANCH  Your physician recommends that you continue on your current medications as directed. Please refer to the Current Medication list given to you today.  Thank you for choosing [facility]!!    

## 2019-07-21 ENCOUNTER — Other Ambulatory Visit: Payer: Self-pay | Admitting: Cardiology

## 2019-07-21 MED ORDER — FUROSEMIDE 20 MG PO TABS: 20.0000 mg | ORAL_TABLET | Freq: Every day | ORAL | 6 refills | Status: DC

## 2019-07-21 NOTE — Telephone Encounter (Signed)
refill complete 

## 2019-07-21 NOTE — Telephone Encounter (Signed)
*  STAT* If patient is at the pharmacy, call can be transferred to refill team.   1. Which medications need to be refilled? furosemide (LASIX) 20 MG tablet    2. Which pharmacy/location (including street and city if local pharmacy) is medication to be sent to? [facility] in [place], [place]   3. Do they need a 30 day or 90 day supply?

## 2019-07-25 ENCOUNTER — Other Ambulatory Visit: Payer: Self-pay | Admitting: *Deleted

## 2019-07-25 DIAGNOSIS — E119 Type 2 diabetes mellitus without complications: Secondary | ICD-10-CM | POA: Diagnosis not present

## 2019-07-25 DIAGNOSIS — I252 Old myocardial infarction: Secondary | ICD-10-CM | POA: Diagnosis not present

## 2019-07-25 DIAGNOSIS — I5023 Acute on chronic systolic (congestive) heart failure: Secondary | ICD-10-CM | POA: Diagnosis not present

## 2019-07-25 DIAGNOSIS — Z20822 Contact with and (suspected) exposure to covid-19: Secondary | ICD-10-CM | POA: Diagnosis not present

## 2019-07-25 DIAGNOSIS — I4891 Unspecified atrial fibrillation: Secondary | ICD-10-CM | POA: Diagnosis not present

## 2019-07-25 DIAGNOSIS — I110 Hypertensive heart disease with heart failure: Secondary | ICD-10-CM | POA: Diagnosis not present

## 2019-07-25 DIAGNOSIS — Z8659 Personal history of other mental and behavioral disorders: Secondary | ICD-10-CM | POA: Diagnosis not present

## 2019-07-25 DIAGNOSIS — I2510 Atherosclerotic heart disease of native coronary artery without angina pectoris: Secondary | ICD-10-CM | POA: Diagnosis not present

## 2019-07-25 DIAGNOSIS — R0602 Shortness of breath: Secondary | ICD-10-CM | POA: Diagnosis not present

## 2019-07-25 DIAGNOSIS — J449 Chronic obstructive pulmonary disease, unspecified: Secondary | ICD-10-CM | POA: Diagnosis not present

## 2019-07-25 DIAGNOSIS — J410 Simple chronic bronchitis: Secondary | ICD-10-CM | POA: Diagnosis not present

## 2019-07-25 DIAGNOSIS — I255 Ischemic cardiomyopathy: Secondary | ICD-10-CM | POA: Diagnosis not present

## 2019-07-25 DIAGNOSIS — I4819 Other persistent atrial fibrillation: Secondary | ICD-10-CM | POA: Diagnosis not present

## 2019-07-25 DIAGNOSIS — E785 Hyperlipidemia, unspecified: Secondary | ICD-10-CM | POA: Diagnosis not present

## 2019-07-25 DIAGNOSIS — I480 Paroxysmal atrial fibrillation: Secondary | ICD-10-CM | POA: Diagnosis not present

## 2019-07-25 DIAGNOSIS — Z794 Long term (current) use of insulin: Secondary | ICD-10-CM | POA: Diagnosis not present

## 2019-07-25 DIAGNOSIS — I959 Hypotension, unspecified: Secondary | ICD-10-CM | POA: Diagnosis not present

## 2019-07-25 DIAGNOSIS — R079 Chest pain, unspecified: Secondary | ICD-10-CM | POA: Diagnosis not present

## 2019-07-25 DIAGNOSIS — I517 Cardiomegaly: Secondary | ICD-10-CM | POA: Diagnosis not present

## 2019-07-25 DIAGNOSIS — J90 Pleural effusion, not elsewhere classified: Secondary | ICD-10-CM | POA: Diagnosis not present

## 2019-07-25 DIAGNOSIS — Z951 Presence of aortocoronary bypass graft: Secondary | ICD-10-CM | POA: Diagnosis not present

## 2019-07-25 DIAGNOSIS — I4892 Unspecified atrial flutter: Secondary | ICD-10-CM | POA: Diagnosis not present

## 2019-07-25 MED ORDER — GENERIC EXTERNAL MEDICATION
1.00 | Status: DC
Start: ? — End: 2019-07-25

## 2019-08-03 DIAGNOSIS — I509 Heart failure, unspecified: Secondary | ICD-10-CM | POA: Diagnosis not present

## 2019-08-03 DIAGNOSIS — J449 Chronic obstructive pulmonary disease, unspecified: Secondary | ICD-10-CM | POA: Diagnosis not present

## 2019-08-03 DIAGNOSIS — I82622 Acute embolism and thrombosis of deep veins of left upper extremity: Secondary | ICD-10-CM | POA: Diagnosis not present

## 2019-08-03 DIAGNOSIS — I110 Hypertensive heart disease with heart failure: Secondary | ICD-10-CM | POA: Diagnosis not present

## 2019-08-03 DIAGNOSIS — K219 Gastro-esophageal reflux disease without esophagitis: Secondary | ICD-10-CM | POA: Diagnosis not present

## 2019-08-03 DIAGNOSIS — R2232 Localized swelling, mass and lump, left upper limb: Secondary | ICD-10-CM | POA: Diagnosis not present

## 2019-08-03 DIAGNOSIS — E119 Type 2 diabetes mellitus without complications: Secondary | ICD-10-CM | POA: Diagnosis not present

## 2019-08-03 DIAGNOSIS — L03114 Cellulitis of left upper limb: Secondary | ICD-10-CM | POA: Diagnosis not present

## 2019-08-03 DIAGNOSIS — Z794 Long term (current) use of insulin: Secondary | ICD-10-CM | POA: Diagnosis not present

## 2019-08-03 DIAGNOSIS — M7989 Other specified soft tissue disorders: Secondary | ICD-10-CM | POA: Diagnosis not present

## 2019-08-03 DIAGNOSIS — I252 Old myocardial infarction: Secondary | ICD-10-CM | POA: Diagnosis not present

## 2019-08-15 DIAGNOSIS — R0600 Dyspnea, unspecified: Secondary | ICD-10-CM | POA: Diagnosis not present

## 2019-08-15 DIAGNOSIS — I517 Cardiomegaly: Secondary | ICD-10-CM | POA: Diagnosis not present

## 2019-08-15 DIAGNOSIS — I4891 Unspecified atrial fibrillation: Secondary | ICD-10-CM | POA: Diagnosis not present

## 2019-08-15 DIAGNOSIS — Z794 Long term (current) use of insulin: Secondary | ICD-10-CM | POA: Diagnosis not present

## 2019-08-15 DIAGNOSIS — J449 Chronic obstructive pulmonary disease, unspecified: Secondary | ICD-10-CM | POA: Diagnosis not present

## 2019-08-15 DIAGNOSIS — I509 Heart failure, unspecified: Secondary | ICD-10-CM | POA: Diagnosis not present

## 2019-08-15 DIAGNOSIS — E119 Type 2 diabetes mellitus without complications: Secondary | ICD-10-CM | POA: Diagnosis not present

## 2019-08-15 DIAGNOSIS — I4892 Unspecified atrial flutter: Secondary | ICD-10-CM | POA: Diagnosis not present

## 2019-08-15 DIAGNOSIS — K219 Gastro-esophageal reflux disease without esophagitis: Secondary | ICD-10-CM | POA: Diagnosis not present

## 2019-08-15 DIAGNOSIS — I878 Other specified disorders of veins: Secondary | ICD-10-CM | POA: Diagnosis not present

## 2019-08-15 DIAGNOSIS — I110 Hypertensive heart disease with heart failure: Secondary | ICD-10-CM | POA: Diagnosis not present

## 2019-08-15 DIAGNOSIS — R140 Abdominal distension (gaseous): Secondary | ICD-10-CM | POA: Diagnosis not present

## 2019-08-15 DIAGNOSIS — Z79899 Other long term (current) drug therapy: Secondary | ICD-10-CM | POA: Diagnosis not present

## 2019-08-25 DIAGNOSIS — R140 Abdominal distension (gaseous): Secondary | ICD-10-CM | POA: Diagnosis not present

## 2019-08-25 DIAGNOSIS — Z7982 Long term (current) use of aspirin: Secondary | ICD-10-CM | POA: Diagnosis not present

## 2019-08-25 DIAGNOSIS — I82622 Acute embolism and thrombosis of deep veins of left upper extremity: Secondary | ICD-10-CM | POA: Diagnosis not present

## 2019-08-25 DIAGNOSIS — I480 Paroxysmal atrial fibrillation: Secondary | ICD-10-CM | POA: Diagnosis not present

## 2019-08-25 DIAGNOSIS — I252 Old myocardial infarction: Secondary | ICD-10-CM | POA: Diagnosis not present

## 2019-08-25 DIAGNOSIS — K3189 Other diseases of stomach and duodenum: Secondary | ICD-10-CM | POA: Diagnosis not present

## 2019-08-25 DIAGNOSIS — I2510 Atherosclerotic heart disease of native coronary artery without angina pectoris: Secondary | ICD-10-CM | POA: Diagnosis not present

## 2019-08-25 DIAGNOSIS — I255 Ischemic cardiomyopathy: Secondary | ICD-10-CM | POA: Diagnosis not present

## 2019-08-25 DIAGNOSIS — R0600 Dyspnea, unspecified: Secondary | ICD-10-CM | POA: Diagnosis not present

## 2019-08-25 DIAGNOSIS — I509 Heart failure, unspecified: Secondary | ICD-10-CM | POA: Diagnosis not present

## 2019-08-25 DIAGNOSIS — I5023 Acute on chronic systolic (congestive) heart failure: Secondary | ICD-10-CM | POA: Diagnosis not present

## 2019-08-25 DIAGNOSIS — J449 Chronic obstructive pulmonary disease, unspecified: Secondary | ICD-10-CM | POA: Diagnosis not present

## 2019-08-25 DIAGNOSIS — I878 Other specified disorders of veins: Secondary | ICD-10-CM | POA: Diagnosis not present

## 2019-08-25 DIAGNOSIS — Z794 Long term (current) use of insulin: Secondary | ICD-10-CM | POA: Diagnosis not present

## 2019-08-25 DIAGNOSIS — Z8659 Personal history of other mental and behavioral disorders: Secondary | ICD-10-CM | POA: Diagnosis not present

## 2019-08-25 DIAGNOSIS — Z20822 Contact with and (suspected) exposure to covid-19: Secondary | ICD-10-CM | POA: Diagnosis not present

## 2019-08-25 DIAGNOSIS — I10 Essential (primary) hypertension: Secondary | ICD-10-CM | POA: Diagnosis not present

## 2019-08-25 DIAGNOSIS — I110 Hypertensive heart disease with heart failure: Secondary | ICD-10-CM | POA: Diagnosis not present

## 2019-08-25 DIAGNOSIS — E785 Hyperlipidemia, unspecified: Secondary | ICD-10-CM | POA: Diagnosis not present

## 2019-08-25 DIAGNOSIS — Z951 Presence of aortocoronary bypass graft: Secondary | ICD-10-CM | POA: Diagnosis not present

## 2019-08-25 DIAGNOSIS — E108 Type 1 diabetes mellitus with unspecified complications: Secondary | ICD-10-CM | POA: Diagnosis not present

## 2019-08-25 DIAGNOSIS — E119 Type 2 diabetes mellitus without complications: Secondary | ICD-10-CM | POA: Diagnosis not present

## 2019-09-02 DIAGNOSIS — I509 Heart failure, unspecified: Secondary | ICD-10-CM | POA: Diagnosis not present

## 2019-09-02 DIAGNOSIS — I517 Cardiomegaly: Secondary | ICD-10-CM | POA: Diagnosis not present

## 2019-09-02 DIAGNOSIS — I4892 Unspecified atrial flutter: Secondary | ICD-10-CM | POA: Diagnosis not present

## 2019-09-02 DIAGNOSIS — J45909 Unspecified asthma, uncomplicated: Secondary | ICD-10-CM | POA: Diagnosis not present

## 2019-09-02 DIAGNOSIS — I110 Hypertensive heart disease with heart failure: Secondary | ICD-10-CM | POA: Diagnosis not present

## 2019-09-02 DIAGNOSIS — E119 Type 2 diabetes mellitus without complications: Secondary | ICD-10-CM | POA: Diagnosis not present

## 2019-09-02 DIAGNOSIS — R600 Localized edema: Secondary | ICD-10-CM | POA: Diagnosis not present

## 2019-09-02 DIAGNOSIS — J90 Pleural effusion, not elsewhere classified: Secondary | ICD-10-CM | POA: Diagnosis not present

## 2019-09-02 DIAGNOSIS — Z79899 Other long term (current) drug therapy: Secondary | ICD-10-CM | POA: Diagnosis not present

## 2019-09-02 DIAGNOSIS — Z794 Long term (current) use of insulin: Secondary | ICD-10-CM | POA: Diagnosis not present

## 2019-09-02 DIAGNOSIS — J449 Chronic obstructive pulmonary disease, unspecified: Secondary | ICD-10-CM | POA: Diagnosis not present

## 2019-09-06 DIAGNOSIS — I5021 Acute systolic (congestive) heart failure: Secondary | ICD-10-CM | POA: Diagnosis not present

## 2019-09-06 DIAGNOSIS — I517 Cardiomegaly: Secondary | ICD-10-CM | POA: Diagnosis not present

## 2019-09-06 DIAGNOSIS — J9601 Acute respiratory failure with hypoxia: Secondary | ICD-10-CM | POA: Diagnosis not present

## 2019-09-06 DIAGNOSIS — J811 Chronic pulmonary edema: Secondary | ICD-10-CM | POA: Diagnosis not present

## 2019-09-06 DIAGNOSIS — J441 Chronic obstructive pulmonary disease with (acute) exacerbation: Secondary | ICD-10-CM | POA: Diagnosis not present

## 2019-09-06 DIAGNOSIS — R0600 Dyspnea, unspecified: Secondary | ICD-10-CM | POA: Diagnosis not present

## 2019-09-06 DIAGNOSIS — I2510 Atherosclerotic heart disease of native coronary artery without angina pectoris: Secondary | ICD-10-CM | POA: Diagnosis not present

## 2019-09-06 DIAGNOSIS — Z794 Long term (current) use of insulin: Secondary | ICD-10-CM | POA: Diagnosis not present

## 2019-09-06 DIAGNOSIS — I10 Essential (primary) hypertension: Secondary | ICD-10-CM | POA: Diagnosis not present

## 2019-09-06 DIAGNOSIS — I480 Paroxysmal atrial fibrillation: Secondary | ICD-10-CM | POA: Diagnosis not present

## 2019-09-06 DIAGNOSIS — I130 Hypertensive heart and chronic kidney disease with heart failure and stage 1 through stage 4 chronic kidney disease, or unspecified chronic kidney disease: Secondary | ICD-10-CM | POA: Diagnosis not present

## 2019-09-06 DIAGNOSIS — Z7901 Long term (current) use of anticoagulants: Secondary | ICD-10-CM | POA: Diagnosis not present

## 2019-09-06 DIAGNOSIS — J90 Pleural effusion, not elsewhere classified: Secondary | ICD-10-CM | POA: Diagnosis not present

## 2019-09-06 DIAGNOSIS — E109 Type 1 diabetes mellitus without complications: Secondary | ICD-10-CM | POA: Diagnosis not present

## 2019-09-06 DIAGNOSIS — I5022 Chronic systolic (congestive) heart failure: Secondary | ICD-10-CM | POA: Diagnosis not present

## 2019-09-06 DIAGNOSIS — E1122 Type 2 diabetes mellitus with diabetic chronic kidney disease: Secondary | ICD-10-CM | POA: Diagnosis not present

## 2019-09-11 NOTE — Progress Notes (Deleted)
Cardiology Office Note  Date: 09/11/2019   ID: LAVANTE TOSO, DOB [birth year], MRN [id number]  PCP:  Caryl Bis, MD  Cardiologist:  Carlyle Dolly, MD Electrophysiologist:  None   Chief Complaint: Chronic systolic HF/ICM/CAD, HTN, HLD, COPD, H/O substance abuse  History of Present Illness: Shane Frye is a 54 y.o. male with a history of Chronic systolic HF/ICM/CAD, postoperative period, substance abuse(cocaine), COPD, HTN,DM.  He was homeless living in his car. States he has a place to stay now.  Severe 3 vessel CAD: s/p 6 vessel CAG 10/2013 CHF 06/2018 admit R[facility]/LHC 06/2018: LM patent, ostial LAD 85%, ramus 60%, ostial LCX 90%, OM1 100%, RCA distal occlusion. LIMA-LAD patent, occluded SVG-ramus, SVG-OM2, SVG-RPDA  [facility] ED visit 06/16/2019: SOB, legs swelling. Ran out of Lasix medication 4 days prior.   Admit [facility] 06/29/2019 Acute on chronic heart failure. BNP 4761.  Admitted for diuresis and treatment of CHF exacerbation.  Echo 5/00/9381 showed diastolic dysfunction with EF of 25%.  Patient states he has been doing well since discharge from the hospital after recent CHF exacerbation.  He states he ran out of his Lasix medication and now has a refill.  He states he feels much better and has lost weight.  Denies any lower extremity swelling, orthopnea, PND.  He states he does not have a means to weigh himself.  He does state he has a place to stay now and scales may be available to weigh himself every day.   Past Medical History:  Diagnosis Date  . Anxiety   . COPD (chronic obstructive pulmonary disease) ([place])   . Depression   . Diabetes mellitus   . GERD (gastroesophageal reflux disease)   . High cholesterol   . Hypertension   . Insomnia   . Myocardial infarction ([place]) 11/04/13  . Neuropathy     Past Surgical History:  Procedure Laterality Date  . CARDIOVERSION N/A 11/06/2013   Procedure: CARDIOVERSION;  Surgeon: Thayer Headings, MD;  Location: [place];   Service: Cardiovascular;  Laterality: N/A;  . CARPAL TUNNEL RELEASE  04/2015  . COLONOSCOPY N/A 05/11/2014   Procedure: COLONOSCOPY;  Surgeon: Rogene Houston, MD;  [facility];  Service: Endoscopy;  Laterality: N/A;  930  . CORONARY ARTERY BYPASS GRAFT N/A 11/09/2013   Procedure: CORONARY ARTERY BYPASS GRAFTING (CABG);  Surgeon: Melrose Nakayama, MD;  Location: [place];  Service: Open Heart Surgery;  Laterality: N/A;  . INTRAOPERATIVE TRANSESOPHAGEAL ECHOCARDIOGRAM N/A 11/09/2013   Procedure: INTRAOPERATIVE TRANSESOPHAGEAL ECHOCARDIOGRAM;  Surgeon: Melrose Nakayama, MD;  Location: [place];  Service: Open Heart Surgery;  Laterality: N/A;  . LEFT HEART CATHETERIZATION WITH CORONARY ANGIOGRAM N/A 11/04/2013   Procedure: LEFT HEART CATHETERIZATION WITH CORONARY ANGIOGRAM;  Surgeon: Troy Sine, MD;  Location: [facility] CATH LAB;  Service: Cardiovascular;  Laterality: N/A;  . NO PAST SURGERIES    . RIGHT/LEFT HEART CATH AND CORONARY/GRAFT ANGIOGRAPHY N/A 07/14/2018   Procedure: RIGHT/LEFT HEART CATH AND CORONARY/GRAFT ANGIOGRAPHY;  Surgeon: Wellington Hampshire, MD;  Location: [place] CV LAB;  Service: Cardiovascular;  Laterality: N/A;    Current Outpatient Medications  Medication Sig Dispense Refill  . albuterol (VENTOLIN HFA) 108 (90 Base) MCG/ACT inhaler Inhale 2 puffs into the lungs every 6 (six) hours as needed for wheezing or shortness of breath. 1 Inhaler 5  . aspirin 81 MG tablet Take 81 mg by mouth daily.    Marland Kitchen atorvastatin (LIPITOR) 80 MG tablet Take 1 tablet (  80 mg total) by mouth daily at 6 PM. 90 tablet 2  . carvedilol (COREG) 6.25 MG tablet Take 2 tablets (12.5 mg total) by mouth 2 (two) times daily. 360 tablet 3  . citalopram (CELEXA) 20 MG tablet Take 1 tablet (20 mg total) by mouth daily. 90 tablet 3  . furosemide (LASIX) 20 MG tablet Take 1 tablet (20 mg total) by mouth daily. 30 tablet 6  . glucose blood test strip Use as instructed 100 each 12  . insulin aspart  protamine- aspart (NOVOLOG MIX 70/30) (70-30) 100 UNIT/ML injection Inject 0.15-0.3 mLs (15-30 Units total) into the skin 3 (three) times daily. (Patient taking differently: Inject 20 Units into the skin 3 (three) times daily. ) 30 mL 5  . Insulin Syringe-Needle U-100 (RELION INSULIN SYR 0.3ML/31G) 31G X 5/16" 0.3 ML MISC USE ONE SYRINGE EACH TIME THREE TIMES DAILY 100 each 5  . metFORMIN (GLUCOPHAGE) 1000 MG tablet Take 1 tablet (1,000 mg total) by mouth 2 (two) times daily with a meal. 180 tablet 3  . pantoprazole (PROTONIX) 40 MG tablet Take 1 tablet (40 mg total) by mouth daily. 90 tablet 3  . rOPINIRole (REQUIP) 0.5 MG tablet Take 1-2 tablets (0.5-1 mg total) by mouth at bedtime. 60 tablet 5  . sacubitril-valsartan (ENTRESTO) 49-51 MG Take 1 tablet by mouth 2 (two) times daily. 60 tablet 6  . spironolactone (ALDACTONE) 25 MG tablet Take 0.5 tablets (12.5 mg total) by mouth daily. 45 tablet 2   No current facility-administered medications for this visit.   Allergies:  Patient has no known allergies.   Social History: The patient  reports that he quit smoking about 13 months ago. His smoking use included cigarettes. He smoked 0.50 packs per day. He quit smokeless tobacco use about 5 years ago. He reports that he does not drink alcohol and does not use drugs.   Family History: The patient's family history includes Diabetes in his brother; Hypertension in his brother and paternal grandfather.   ROS:  Please see the history of present illness. Otherwise, complete review of systems is positive for none.  All other systems are reviewed and negative.   Physical Exam: VS:  There were no vitals taken for this visit., BMI There is no height or weight on file to calculate BMI.  Wt Readings from Last 3 Encounters:  07/04/19 239 lb (108.4 kg)  06/16/19 240 lb (108.9 kg)  03/30/19 224 lb 13.9 oz (102 kg)    General: Patient appears comfortable at rest. Neck: Supple, no elevated JVP or carotid  bruits, no thyromegaly. Lungs: Clear to auscultation, nonlabored breathing at rest. Cardiac: Regular rate and rhythm, no S3 or significant systolic murmur, no pericardial rub. Extremities: No pitting edema, distal pulses 2+. Skin: Warm and dry. Musculoskeletal: No kyphosis. Neuropsychiatric: Alert and oriented x3, affect grossly appropriate.  ECG:  An ECG dated 06/30/2019 was personally reviewed today and demonstrated:  Normal sinus rhythm rate of 71 minimal voltage criteria for LVH, possible anterior infarct, age undetermined, ST and T wave abnormality consider lateral ischemia,.  Compared to EKG 04/14/2019 left ventricular hypertrophy now present.  This EKG was performed [facility]  Recent Labwork: 06/16/2019: B Natriuretic Peptide 845.0; BUN 15; Creatinine, Ser 0.69; Hemoglobin 13.5; Platelets 326; Potassium 4.3; Sodium 130     Component Value Date/Time   CHOL 147 03/09/2018 0859   TRIG 113 03/09/2018 0859   HDL 40 03/09/2018 0859   CHOLHDL 3.7 03/09/2018 0859   CHOLHDL 3.4 11/06/2013 0300  VLDL 39 11/06/2013 0300   LDLCALC 84 03/09/2018 0859    Other Studies Reviewed Today:  06/29/2019 Echo Summary  1. Technically difficult study due to chest wall/lung interference and body habitus.  2. Echo contrast utilized to enhance endocardial border definition.  3. The left ventricle is moderately to severely dilated in size with mildly increased wall thickness.  4. The left ventricular systolic function is severely decreased, LVEF is visually estimated at ~25%.  5. There is grade II diastolic dysfunction (elevated filling pressure).  6. The left atrium is moderately dilated in size.  7. The right ventricle is normal in size, with mildly reduced systolic function.   06/2018 echo IMPRESSIONS 1. The left ventricle has severely reduced systolic function, with an ejection fraction of 25-30%. The cavity size was mildly dilated. There is mildly increased left ventricular wall  thickness. Left ventricular diastolic Doppler parameters are  consistent with restrictive filling. Elevated mean left atrial pressure. 2. The right ventricle has moderately reduced systolic function. The cavity was normal. There is no increase in right ventricular wall thickness. 3. Left atrial size was severely dilated. 4. Right atrial size was mildly dilated. 5. No evidence of mitral valve stenosis. 6. The aortic valve has an indeterminate number of cusps. No stenosis of the aortic valve. 7. The aortic root is normal in size and structure. 8. Pulmonary hypertension is indeterminant, inadequate TR jet. 9. The inferior vena cava was dilated in size with >50% respiratory variability.  06/2018 cath  Ost Ramus to Ramus lesion is 60% stenosed.  Ost Cx to Prox Cx lesion is 90% stenosed.  Mid Cx lesion is 80% stenosed.  Ost 1st Mrg lesion is 100% stenosed.  LIMA and is normal in caliber.  Prox RCA to Mid RCA lesion is 30% stenosed.  SVG.  Origin to Prox Graft lesion is 100% stenosed.  SVG.  Origin to Prox Graft lesion is 100% stenosed.  SVG.  Origin lesion is 100% stenosed.  Prox LAD lesion is 70% stenosed.  Ost LAD to Prox LAD lesion is 85% stenosed.  Dist RCA lesion is 100% stenosed.  1. Severe underlying three-vessel coronary artery disease with occluded vein grafts (SVG to OM, SVG to diagonal/ramus and SVG to right PDA/PL). The only patent graft is LIMA to LAD. The native coronary arteries are heavily calcified and diffusely diseased. 2. Left ventricular angiography was not performed. EF was severely reduced by echo at 20%. 3. Right heart catheterization showed moderately elevated filling pressures, moderate pulmonary hypertension and normal cardiac output.  Recommendations: Unfortunately, all vein grafts are occluded. The RCA is occluded distally with heavy calcifications. The left circumflex is heavily calcified and diffusely diseased. Both of these  are not suitable for PCI. Also the distal branches are relatively small and likely not good targets for redo CABG. His best option is aggressive medical therapy. Regarding heart failure, he continues to be moderately volume overloaded with a wedge pressure of 29 mmHg. I resumed IV furosemide. I added losartan. Consider transitioning to [facility] and adding spironolactone.  Assessment and Plan:  No diagnosis found. 1. Chronic systolic heart failure [facility]) Recent admission to [facility] for CHF exacerbation.  He was diuresed and discharged.  Echocardiogram performed during hospital stay showed grade 2 diastolic dysfunction with EF of around 25%.  Continue Lasix 20 mg daily.  May take an extra dose if weight gain 3 pounds in 1 day or 5 pounds in 1 week.  Continue carvedilol 12.5 mg p.o. twice daily.  Continue  Entresto 49/51 mg p.o. twice daily.  Continue spironolactone 12.5 mg daily.  Advised fluid restrictions of 60 ounces per day, restrict salt intake.  Advised strict adherence to medical therapy.  Patient verbalizes understanding.  2. Coronary artery disease involving native coronary artery of native heart without angina pectoris Status post 6 vessel bypass 2006.  No recent anginal or exertional symptoms.  .  Continue aspirin 81 mg daily,  3. Essential hypertension Blood pressure 138/68 today.  Continue carvedilol 12.5 mg p.o. twice daily, Lasix 20 mg daily.  4. PAF (paroxysmal atrial fibrillation) (HCC) No recent episodes of palpitations or arrhythmias.  Continue carvedilol 12.5 mg p.o. twice daily.  5. Mixed hyperlipidemia Last lipid panel showed total cholesterol 147, triglycerides 113, HDL 40, LDL 84.  Not at goal.  Continue atorvastatin 80 mg p.o. daily.  Medication Adjustments/Labs and Tests Ordered: Current medicines are reviewed at length with the patient today.  Concerns regarding medicines are outlined above.   Disposition: Follow-up with Dr. Harl Bowie 2 months Signed, Levell July, NP 09/11/2019 6:06 PM    [facility] at [place], [place], [place] [address] Phone: [phone]; Fax: [phone]

## 2019-09-12 ENCOUNTER — Inpatient Hospital Stay (HOSPITAL_COMMUNITY): Payer: Medicare HMO

## 2019-09-12 ENCOUNTER — Ambulatory Visit: Payer: Medicare HMO | Admitting: Family Medicine

## 2019-09-12 ENCOUNTER — Emergency Department (HOSPITAL_COMMUNITY): Payer: Medicare HMO

## 2019-09-12 ENCOUNTER — Encounter (HOSPITAL_COMMUNITY): Payer: Self-pay | Admitting: Emergency Medicine

## 2019-09-12 ENCOUNTER — Other Ambulatory Visit: Payer: Self-pay

## 2019-09-12 ENCOUNTER — Inpatient Hospital Stay (HOSPITAL_COMMUNITY)
Admission: EM | Admit: 2019-09-12 | Discharge: 2019-09-15 | DRG: 291 | Discharge status: Against Medical Advice | Payer: Medicare HMO | Attending: Family Medicine | Admitting: Family Medicine

## 2019-09-12 DIAGNOSIS — K219 Gastro-esophageal reflux disease without esophagitis: Secondary | ICD-10-CM

## 2019-09-12 DIAGNOSIS — Z8249 Family history of ischemic heart disease and other diseases of the circulatory system: Secondary | ICD-10-CM | POA: Diagnosis not present

## 2019-09-12 DIAGNOSIS — I130 Hypertensive heart and chronic kidney disease with heart failure and stage 1 through stage 4 chronic kidney disease, or unspecified chronic kidney disease: Secondary | ICD-10-CM | POA: Diagnosis not present

## 2019-09-12 DIAGNOSIS — L97929 Non-pressure chronic ulcer of unspecified part of left lower leg with unspecified severity: Secondary | ICD-10-CM | POA: Diagnosis not present

## 2019-09-12 DIAGNOSIS — Z833 Family history of diabetes mellitus: Secondary | ICD-10-CM

## 2019-09-12 DIAGNOSIS — N182 Chronic kidney disease, stage 2 (mild): Secondary | ICD-10-CM | POA: Diagnosis not present

## 2019-09-12 DIAGNOSIS — I959 Hypotension, unspecified: Secondary | ICD-10-CM | POA: Diagnosis not present

## 2019-09-12 DIAGNOSIS — F329 Major depressive disorder, single episode, unspecified: Secondary | ICD-10-CM | POA: Diagnosis present

## 2019-09-12 DIAGNOSIS — E1122 Type 2 diabetes mellitus with diabetic chronic kidney disease: Secondary | ICD-10-CM | POA: Diagnosis present

## 2019-09-12 DIAGNOSIS — I252 Old myocardial infarction: Secondary | ICD-10-CM | POA: Diagnosis not present

## 2019-09-12 DIAGNOSIS — I5023 Acute on chronic systolic (congestive) heart failure: Secondary | ICD-10-CM | POA: Diagnosis present

## 2019-09-12 DIAGNOSIS — E871 Hypo-osmolality and hyponatremia: Secondary | ICD-10-CM | POA: Diagnosis present

## 2019-09-12 DIAGNOSIS — L97919 Non-pressure chronic ulcer of unspecified part of right lower leg with unspecified severity: Secondary | ICD-10-CM | POA: Diagnosis not present

## 2019-09-12 DIAGNOSIS — J449 Chronic obstructive pulmonary disease, unspecified: Secondary | ICD-10-CM | POA: Diagnosis not present

## 2019-09-12 DIAGNOSIS — I5021 Acute systolic (congestive) heart failure: Secondary | ICD-10-CM

## 2019-09-12 DIAGNOSIS — Q600 Renal agenesis, unilateral: Secondary | ICD-10-CM | POA: Diagnosis not present

## 2019-09-12 DIAGNOSIS — Z79899 Other long term (current) drug therapy: Secondary | ICD-10-CM | POA: Diagnosis not present

## 2019-09-12 DIAGNOSIS — E6609 Other obesity due to excess calories: Secondary | ICD-10-CM | POA: Diagnosis not present

## 2019-09-12 DIAGNOSIS — Z5329 Procedure and treatment not carried out because of patient's decision for other reasons: Secondary | ICD-10-CM | POA: Diagnosis present

## 2019-09-12 DIAGNOSIS — Z6839 Body mass index (BMI) 39.0-39.9, adult: Secondary | ICD-10-CM | POA: Diagnosis not present

## 2019-09-12 DIAGNOSIS — E8770 Fluid overload, unspecified: Secondary | ICD-10-CM | POA: Diagnosis not present

## 2019-09-12 DIAGNOSIS — L089 Local infection of the skin and subcutaneous tissue, unspecified: Secondary | ICD-10-CM | POA: Diagnosis not present

## 2019-09-12 DIAGNOSIS — F419 Anxiety disorder, unspecified: Secondary | ICD-10-CM | POA: Diagnosis present

## 2019-09-12 DIAGNOSIS — G4700 Insomnia, unspecified: Secondary | ICD-10-CM | POA: Diagnosis present

## 2019-09-12 DIAGNOSIS — E785 Hyperlipidemia, unspecified: Secondary | ICD-10-CM | POA: Diagnosis present

## 2019-09-12 DIAGNOSIS — E782 Mixed hyperlipidemia: Secondary | ICD-10-CM | POA: Diagnosis not present

## 2019-09-12 DIAGNOSIS — Z87891 Personal history of nicotine dependence: Secondary | ICD-10-CM | POA: Diagnosis not present

## 2019-09-12 DIAGNOSIS — R238 Other skin changes: Secondary | ICD-10-CM | POA: Diagnosis not present

## 2019-09-12 DIAGNOSIS — E7800 Pure hypercholesterolemia, unspecified: Secondary | ICD-10-CM | POA: Diagnosis present

## 2019-09-12 DIAGNOSIS — N179 Acute kidney failure, unspecified: Secondary | ICD-10-CM | POA: Diagnosis not present

## 2019-09-12 DIAGNOSIS — Z7982 Long term (current) use of aspirin: Secondary | ICD-10-CM

## 2019-09-12 DIAGNOSIS — L03114 Cellulitis of left upper limb: Secondary | ICD-10-CM | POA: Diagnosis not present

## 2019-09-12 DIAGNOSIS — G629 Polyneuropathy, unspecified: Secondary | ICD-10-CM | POA: Diagnosis present

## 2019-09-12 DIAGNOSIS — E872 Acidosis: Secondary | ICD-10-CM | POA: Diagnosis present

## 2019-09-12 DIAGNOSIS — I4892 Unspecified atrial flutter: Secondary | ICD-10-CM | POA: Diagnosis not present

## 2019-09-12 DIAGNOSIS — J90 Pleural effusion, not elsewhere classified: Secondary | ICD-10-CM | POA: Diagnosis not present

## 2019-09-12 DIAGNOSIS — Z794 Long term (current) use of insulin: Secondary | ICD-10-CM

## 2019-09-12 DIAGNOSIS — R0602 Shortness of breath: Secondary | ICD-10-CM | POA: Diagnosis not present

## 2019-09-12 DIAGNOSIS — E669 Obesity, unspecified: Secondary | ICD-10-CM | POA: Diagnosis present

## 2019-09-12 DIAGNOSIS — I2510 Atherosclerotic heart disease of native coronary artery without angina pectoris: Secondary | ICD-10-CM | POA: Diagnosis present

## 2019-09-12 DIAGNOSIS — I5082 Biventricular heart failure: Secondary | ICD-10-CM | POA: Diagnosis not present

## 2019-09-12 DIAGNOSIS — E1121 Type 2 diabetes mellitus with diabetic nephropathy: Secondary | ICD-10-CM

## 2019-09-12 DIAGNOSIS — I454 Nonspecific intraventricular block: Secondary | ICD-10-CM | POA: Diagnosis not present

## 2019-09-12 DIAGNOSIS — N189 Chronic kidney disease, unspecified: Secondary | ICD-10-CM | POA: Diagnosis present

## 2019-09-12 DIAGNOSIS — N183 Chronic kidney disease, stage 3 unspecified: Secondary | ICD-10-CM | POA: Diagnosis present

## 2019-09-12 DIAGNOSIS — I255 Ischemic cardiomyopathy: Secondary | ICD-10-CM | POA: Diagnosis not present

## 2019-09-12 DIAGNOSIS — R0989 Other specified symptoms and signs involving the circulatory and respiratory systems: Secondary | ICD-10-CM | POA: Diagnosis not present

## 2019-09-12 DIAGNOSIS — Z20822 Contact with and (suspected) exposure to covid-19: Secondary | ICD-10-CM | POA: Diagnosis not present

## 2019-09-12 DIAGNOSIS — E873 Alkalosis: Secondary | ICD-10-CM | POA: Diagnosis not present

## 2019-09-12 DIAGNOSIS — I517 Cardiomegaly: Secondary | ICD-10-CM | POA: Diagnosis not present

## 2019-09-12 DIAGNOSIS — I10 Essential (primary) hypertension: Secondary | ICD-10-CM | POA: Diagnosis not present

## 2019-09-12 DIAGNOSIS — I440 Atrioventricular block, first degree: Secondary | ICD-10-CM | POA: Diagnosis not present

## 2019-09-12 DIAGNOSIS — I5020 Unspecified systolic (congestive) heart failure: Secondary | ICD-10-CM | POA: Diagnosis not present

## 2019-09-12 HISTORY — DX: Heart failure, unspecified: I50.9

## 2019-09-12 HISTORY — DX: Type 2 diabetes mellitus without complications: E11.9

## 2019-09-12 HISTORY — DX: Hyperlipidemia, unspecified: E78.5

## 2019-09-12 HISTORY — DX: Atherosclerotic heart disease of native coronary artery without angina pectoris: I25.10

## 2019-09-12 HISTORY — DX: Unspecified atrial fibrillation: I48.91

## 2019-09-12 HISTORY — DX: Cardiomyopathy, unspecified: I42.9

## 2019-09-12 LAB — BASIC METABOLIC PANEL
Anion gap: 18 — ABNORMAL HIGH (ref 5–15)
BUN: 90 mg/dL — ABNORMAL HIGH (ref 6–20)
CO2: 18 mmol/L — ABNORMAL LOW (ref 22–32)
Calcium: 8.1 mg/dL — ABNORMAL LOW (ref 8.9–10.3)
Chloride: 94 mmol/L — ABNORMAL LOW (ref 98–111)
Creatinine, Ser: 4.1 mg/dL — ABNORMAL HIGH (ref 0.61–1.24)
GFR calc Af Amer: 18 mL/min — ABNORMAL LOW (ref >60)
GFR calc non Af Amer: 15 mL/min — ABNORMAL LOW (ref >60)
Glucose, Bld: 180 mg/dL — ABNORMAL HIGH (ref 70–99)
Potassium: 4 mmol/L (ref 3.5–5.1)
Sodium: 130 mmol/L — ABNORMAL LOW (ref 135–145)

## 2019-09-12 LAB — ECHOCARDIOGRAM COMPLETE
Area-P 1/2: 6.71 cm2
Calc EF: 25.2 %
Height: 69 in
S' Lateral: 5.43 cm
Single Plane A2C EF: 27.8 %
Single Plane A4C EF: 22.4 %
Weight: 4256 oz

## 2019-09-12 LAB — CBC WITH DIFFERENTIAL/PLATELET
Abs Immature Granulocytes: 0.06 10*3/uL (ref 0.00–0.07)
Basophils Absolute: 0 10*3/uL (ref 0.0–0.1)
Basophils Relative: 0 %
Eosinophils Absolute: 0.1 10*3/uL (ref 0.0–0.5)
Eosinophils Relative: 1 %
HCT: 40 % (ref 39.0–52.0)
Hemoglobin: 12.8 g/dL — ABNORMAL LOW (ref 13.0–17.0)
Immature Granulocytes: 1 %
Lymphocytes Relative: 21 %
Lymphs Abs: 2.6 10*3/uL (ref 0.7–4.0)
MCH: 25.5 pg — ABNORMAL LOW (ref 26.0–34.0)
MCHC: 32 g/dL (ref 30.0–36.0)
MCV: 79.8 fL — ABNORMAL LOW (ref 80.0–100.0)
Monocytes Absolute: 1.3 10*3/uL — ABNORMAL HIGH (ref 0.1–1.0)
Monocytes Relative: 11 %
Neutro Abs: 8.6 10*3/uL — ABNORMAL HIGH (ref 1.7–7.7)
Neutrophils Relative %: 66 %
Platelets: 392 10*3/uL (ref 150–400)
RBC: 5.01 MIL/uL (ref 4.22–5.81)
RDW: 15.5 % (ref 11.5–15.5)
WBC: 12.7 10*3/uL — ABNORMAL HIGH (ref 4.0–10.5)
nRBC: 0.2 % (ref 0.0–0.2)

## 2019-09-12 LAB — GLUCOSE, CAPILLARY
Glucose-Capillary: 190 mg/dL — ABNORMAL HIGH (ref 70–99)
Glucose-Capillary: 125 mg/dL — ABNORMAL HIGH (ref 70–99)
Glucose-Capillary: 169 mg/dL — ABNORMAL HIGH (ref 70–99)

## 2019-09-12 LAB — URINALYSIS, ROUTINE W REFLEX MICROSCOPIC
Bilirubin Urine: NEGATIVE
Glucose, UA: NEGATIVE mg/dL
Hgb urine dipstick: NEGATIVE
Ketones, ur: NEGATIVE mg/dL
Leukocytes,Ua: NEGATIVE
Nitrite: NEGATIVE
Protein, ur: 30 mg/dL — AB
Specific Gravity, Urine: 1.015 (ref 1.005–1.030)
pH: 5 (ref 5.0–8.0)

## 2019-09-12 LAB — CBG MONITORING, ED: Glucose-Capillary: 182 mg/dL — ABNORMAL HIGH (ref 70–99)

## 2019-09-12 LAB — TROPONIN I (HIGH SENSITIVITY)
Troponin I (High Sensitivity): 18 ng/L — ABNORMAL HIGH (ref <18)
Troponin I (High Sensitivity): 17 ng/L (ref <18)

## 2019-09-12 LAB — HEMOGLOBIN A1C
Hgb A1c MFr Bld: 9.2 % — ABNORMAL HIGH (ref 4.8–5.6)
Mean Plasma Glucose: 217.34 mg/dL

## 2019-09-12 LAB — SARS CORONAVIRUS 2 BY RT PCR (HOSPITAL ORDER, PERFORMED IN ~~LOC~~ HOSPITAL LAB): SARS Coronavirus 2: NEGATIVE

## 2019-09-12 LAB — HIV ANTIBODY (ROUTINE TESTING W REFLEX): HIV Screen 4th Generation wRfx: NONREACTIVE

## 2019-09-12 LAB — MAGNESIUM: Magnesium: 1.9 mg/dL (ref 1.7–2.4)

## 2019-09-12 LAB — BRAIN NATRIURETIC PEPTIDE: B Natriuretic Peptide: 2047 pg/mL — ABNORMAL HIGH (ref 0.0–100.0)

## 2019-09-12 LAB — TSH: TSH: 5.035 u[IU]/mL — ABNORMAL HIGH (ref 0.350–4.500)

## 2019-09-12 MED ORDER — ROPINIROLE HCL 0.25 MG PO TABS
0.5000 mg | ORAL_TABLET | Freq: Every day | ORAL | Status: DC
  Filled 2019-09-12 (×2): qty 1

## 2019-09-12 MED ORDER — ONDANSETRON HCL 4 MG/2ML IJ SOLN: 4.0000 mg | Freq: Four times a day (QID) | INTRAMUSCULAR | Status: DC | PRN

## 2019-09-12 MED ORDER — FUROSEMIDE 10 MG/ML IJ SOLN
60.0000 mg | Freq: Two times a day (BID) | INTRAMUSCULAR | Status: DC
  Administered 2019-09-12 – 2019-09-14 (×5): 60 mg via INTRAVENOUS
  Filled 2019-09-12 (×7): qty 6

## 2019-09-12 MED ORDER — SODIUM CHLORIDE 0.9% FLUSH: 3.0000 mL | INTRAVENOUS | Status: DC | PRN

## 2019-09-12 MED ORDER — CARVEDILOL 12.5 MG PO TABS
12.5000 mg | ORAL_TABLET | Freq: Two times a day (BID) | ORAL | Status: DC
  Administered 2019-09-12 – 2019-09-15 (×7): 12.5 mg via ORAL
  Filled 2019-09-12 (×8): qty 1

## 2019-09-12 MED ORDER — ALBUTEROL SULFATE (2.5 MG/3ML) 0.083% IN NEBU
INHALATION_SOLUTION | RESPIRATORY_TRACT | Status: AC
  Filled 2019-09-12: qty 3

## 2019-09-12 MED ORDER — PANTOPRAZOLE SODIUM 40 MG PO TBEC
40.0000 mg | DELAYED_RELEASE_TABLET | Freq: Every day | ORAL | Status: DC
  Administered 2019-09-12 – 2019-09-15 (×4): 40 mg via ORAL
  Filled 2019-09-12 (×5): qty 1

## 2019-09-12 MED ORDER — ALBUTEROL SULFATE (2.5 MG/3ML) 0.083% IN NEBU
2.5000 mg | INHALATION_SOLUTION | Freq: Four times a day (QID) | RESPIRATORY_TRACT | Status: DC | PRN
  Administered 2019-09-12: 2.5 mg via RESPIRATORY_TRACT

## 2019-09-12 MED ORDER — ASPIRIN EC 81 MG PO TBEC
81.0000 mg | DELAYED_RELEASE_TABLET | Freq: Every day | ORAL | Status: DC
  Administered 2019-09-12 – 2019-09-15 (×4): 81 mg via ORAL
  Filled 2019-09-12 (×5): qty 1

## 2019-09-12 MED ORDER — BUSPIRONE HCL 5 MG PO TABS
5.0000 mg | ORAL_TABLET | Freq: Two times a day (BID) | ORAL | Status: DC
  Administered 2019-09-12 – 2019-09-15 (×6): 5 mg via ORAL
  Filled 2019-09-12 (×8): qty 1

## 2019-09-12 MED ORDER — SODIUM CHLORIDE 0.9 % IV SOLN: 250.0000 mL | INTRAVENOUS | Status: DC | PRN

## 2019-09-12 MED ORDER — ALBUTEROL SULFATE HFA 108 (90 BASE) MCG/ACT IN AERS: 2.0000 | INHALATION_SPRAY | Freq: Four times a day (QID) | RESPIRATORY_TRACT | Status: DC | PRN

## 2019-09-12 MED ORDER — HEPARIN SODIUM (PORCINE) 5000 UNIT/ML IJ SOLN
5000.0000 [IU] | Freq: Three times a day (TID) | INTRAMUSCULAR | Status: DC
  Administered 2019-09-12 – 2019-09-13 (×4): 5000 [IU] via SUBCUTANEOUS
  Filled 2019-09-12 (×4): qty 1

## 2019-09-12 MED ORDER — SODIUM CHLORIDE 0.9% FLUSH
3.0000 mL | Freq: Two times a day (BID) | INTRAVENOUS | Status: DC
  Administered 2019-09-12 – 2019-09-15 (×7): 3 mL via INTRAVENOUS

## 2019-09-12 MED ORDER — INSULIN ASPART 100 UNIT/ML ~~LOC~~ SOLN
0.0000 [IU] | Freq: Every day | SUBCUTANEOUS | Status: DC
  Administered 2019-09-13: 2 [IU] via SUBCUTANEOUS
  Administered 2019-09-14: 3 [IU] via SUBCUTANEOUS

## 2019-09-12 MED ORDER — CITALOPRAM HYDROBROMIDE 20 MG PO TABS
20.0000 mg | ORAL_TABLET | Freq: Every day | ORAL | Status: DC
  Administered 2019-09-12 – 2019-09-15 (×4): 20 mg via ORAL
  Filled 2019-09-12 (×8): qty 1

## 2019-09-12 MED ORDER — DIPHENHYDRAMINE-ZINC ACETATE 2-0.1 % EX CREA
1.0000 "application " | TOPICAL_CREAM | Freq: Three times a day (TID) | CUTANEOUS | Status: DC | PRN
  Administered 2019-09-13: 1 via TOPICAL
  Filled 2019-09-12 (×2): qty 28

## 2019-09-12 MED ORDER — ATORVASTATIN CALCIUM 40 MG PO TABS
80.0000 mg | ORAL_TABLET | Freq: Every day | ORAL | Status: DC
  Administered 2019-09-12 – 2019-09-15 (×4): 80 mg via ORAL
  Filled 2019-09-12 (×4): qty 2

## 2019-09-12 MED ORDER — ROPINIROLE HCL 0.25 MG PO TABS
0.5000 mg | ORAL_TABLET | Freq: Two times a day (BID) | ORAL | Status: DC
  Administered 2019-09-12 – 2019-09-15 (×7): 0.5 mg via ORAL
  Filled 2019-09-12 (×8): qty 2

## 2019-09-12 MED ORDER — ACETAMINOPHEN 325 MG PO TABS
650.0000 mg | ORAL_TABLET | ORAL | Status: DC | PRN
  Administered 2019-09-12 – 2019-09-13 (×4): 650 mg via ORAL
  Filled 2019-09-12 (×4): qty 2

## 2019-09-12 MED ORDER — CLOTRIMAZOLE 1 % EX CREA
TOPICAL_CREAM | Freq: Two times a day (BID) | CUTANEOUS | Status: DC
  Filled 2019-09-12 (×3): qty 15

## 2019-09-12 MED ORDER — INSULIN ASPART 100 UNIT/ML ~~LOC~~ SOLN
0.0000 [IU] | Freq: Three times a day (TID) | SUBCUTANEOUS | Status: DC
  Administered 2019-09-12 (×2): 2 [IU] via SUBCUTANEOUS
  Administered 2019-09-12: 1 [IU] via SUBCUTANEOUS
  Administered 2019-09-13: 3 [IU] via SUBCUTANEOUS
  Administered 2019-09-13: 2 [IU] via SUBCUTANEOUS
  Administered 2019-09-13: 3 [IU] via SUBCUTANEOUS
  Administered 2019-09-14: 5 [IU] via SUBCUTANEOUS
  Administered 2019-09-14: 3 [IU] via SUBCUTANEOUS
  Administered 2019-09-14: 2 [IU] via SUBCUTANEOUS
  Administered 2019-09-15: 3 [IU] via SUBCUTANEOUS
  Administered 2019-09-15: 5 [IU] via SUBCUTANEOUS
  Administered 2019-09-15: 2 [IU] via SUBCUTANEOUS
  Filled 2019-09-12: qty 1

## 2019-09-12 MED ORDER — PERFLUTREN LIPID MICROSPHERE
1.0000 mL | INTRAVENOUS | Status: AC | PRN
  Administered 2019-09-12: 2 mL via INTRAVENOUS

## 2019-09-12 NOTE — Progress Notes (Addendum)
*  PRELIMINARY RESULTS* Echocardiogram 2D Echocardiogram has been performed with [facility].  Shane Frye 09/12/2019, 1:05 PM

## 2019-09-12 NOTE — Progress Notes (Signed)
Patient is non compliant with safety/fall precautions. Patient has fall mat in place, and bed alarms. Patient is repeatedly getting up and not complying with bed alarms. Patient has been educated on fall precautions. Son is currently in room with the patient.

## 2019-09-12 NOTE — Progress Notes (Signed)
TR[facility] night shift for coverage.  The staff reports that the patient has a pruritic/erythematosus area and is requesting topical relief.  Clotrimazole and topical Benadryl plus zinc ordered earlier.  Tennis Must, MD

## 2019-09-12 NOTE — ED Provider Notes (Signed)
[facility] Provider Note   CSN: [id number] Arrival date & time: 09/12/19  0431     History Chief Complaint  Patient presents with  . Shortness of Breath    Shane Frye is a 54 y.o. male.  HPI Patient presents with shortness of breath.  States trouble breathing.  States has been going for a while.  Recently admitted it appears a couple times at [facility] for this.  States he left 2 days ago but was still feeling short of breath.  He is now on oxygen starting at that visit.  Reportedly on 2 L.  Reviewing records am not sure if he left AMA or not because there was talk of him leaving AMA.  States he has had decreased urination.  He is on Lasix.  States he passed out twice today too.  Claims compliance with his medicines.  States he feels as if he is carrying extra fluid everywhere.  Denies chest pain.    Past Medical History:  Diagnosis Date  . Anxiety   . CHF (congestive heart failure) ([place])   . COPD (chronic obstructive pulmonary disease) ([place])   . Depression   . Diabetes mellitus   . GERD (gastroesophageal reflux disease)   . High cholesterol   . Hypertension   . Insomnia   . Myocardial infarction ([place]) 11/04/13  . Neuropathy     Patient Active Problem List   Diagnosis Date Noted  . Acute on chronic combined systolic and diastolic CHF (congestive heart failure) ([place]) 07/14/2018  . Hyperosmolar non-ketotic state in patient with type 2 diabetes mellitus ([place]) 07/12/2018  . Pain management contract agreement 12/27/2015  . Lumbar disc disease with radiculopathy 11/27/2015  . OSA (obstructive sleep apnea) 07/21/2015  . Hypertension   . GERD (gastroesophageal reflux disease)   . Uncontrolled diabetes mellitus with circulatory complication ([place])   . Insomnia   . Neuropathy   . Rotator cuff tear 01/30/2014  . Dyspnea on exertion 12/26/2013  . Smoker 12/26/2013  . PAF (paroxysmal atrial fibrillation) ([place]) 12/26/2013  . S/P CABG x 6 11/09/2013    . HLD (hyperlipidemia) 11/04/2013  . Obesity 11/04/2013  . Cocaine abuse ([place]) 11/04/2013  . H/O noncompliance with medical treatment, presenting hazards to health 11/04/2013  . CAD (coronary artery disease), native coronary artery 11/04/2013    Past Surgical History:  Procedure Laterality Date  . CARDIOVERSION N/A 11/06/2013   Procedure: CARDIOVERSION;  Surgeon: Thayer Headings, MD;  Location: [place];  Service: Cardiovascular;  Laterality: N/A;  . CARPAL TUNNEL RELEASE  04/2015  . COLONOSCOPY N/A 05/11/2014   Procedure: COLONOSCOPY;  Surgeon: Rogene Houston, MD;  [facility];  Service: Endoscopy;  Laterality: N/A;  930  . CORONARY ARTERY BYPASS GRAFT N/A 11/09/2013   Procedure: CORONARY ARTERY BYPASS GRAFTING (CABG);  Surgeon: Melrose Nakayama, MD;  Location: [place];  Service: Open Heart Surgery;  Laterality: N/A;  . INTRAOPERATIVE TRANSESOPHAGEAL ECHOCARDIOGRAM N/A 11/09/2013   Procedure: INTRAOPERATIVE TRANSESOPHAGEAL ECHOCARDIOGRAM;  Surgeon: Melrose Nakayama, MD;  Location: [place];  Service: Open Heart Surgery;  Laterality: N/A;  . LEFT HEART CATHETERIZATION WITH CORONARY ANGIOGRAM N/A 11/04/2013   Procedure: LEFT HEART CATHETERIZATION WITH CORONARY ANGIOGRAM;  Surgeon: Troy Sine, MD;  Location: [facility] CATH LAB;  Service: Cardiovascular;  Laterality: N/A;  . NO PAST SURGERIES    . RIGHT/LEFT HEART CATH AND CORONARY/GRAFT ANGIOGRAPHY N/A 07/14/2018   Procedure: RIGHT/LEFT HEART CATH AND CORONARY/GRAFT ANGIOGRAPHY;  Surgeon: [name]  A, MD;  Location: [place] CV LAB;  Service: Cardiovascular;  Laterality: N/A;       Family History  Problem Relation Age of Onset  . Hypertension Paternal Grandfather   . Hypertension Brother   . Diabetes Brother   . Neuropathy Neg Hx     Social History   Tobacco Use  . Smoking status: Former Smoker    Packs/day: 0.50    Types: Cigarettes    Quit date: 07/16/2018    Years since quitting: 1.1  . Smokeless tobacco:  Former Systems developer    Quit date: 11/04/2013  . Tobacco comment: reports quitting 07/16/2018  Vaping Use  . Vaping Use: Never used  Substance Use Topics  . Alcohol use: No    Alcohol/week: 2.0 standard drinks    Types: 2 Cans of beer per week  . Drug use: No    Comment: cocaine over 3 years ago    Home Medications Prior to Admission medications   Medication Sig Start Date End Date Taking? Authorizing Provider  albuterol (VENTOLIN HFA) 108 (90 Base) MCG/ACT inhaler Inhale 2 puffs into the lungs every 6 (six) hours as needed for wheezing or shortness of breath. 06/29/18   Terald Sleeper, PA-C  aspirin 81 MG tablet Take 81 mg by mouth daily.    [provider]  atorvastatin (LIPITOR) 80 MG tablet Take 1 tablet (80 mg total) by mouth daily at 6 PM. 03/22/19   Branch, Alphonse Guild, MD  carvedilol (COREG) 6.25 MG tablet Take 2 tablets (12.5 mg total) by mouth 2 (two) times daily. 10/05/18   Terald Sleeper, PA-C  citalopram (CELEXA) 20 MG tablet Take 1 tablet (20 mg total) by mouth daily. 10/05/18   Terald Sleeper, PA-C  furosemide (LASIX) 20 MG tablet Take 1 tablet (20 mg total) by mouth daily. 07/21/19   Arnoldo Lenis, MD  glucose blood test strip Use as instructed 08/07/14   Wardell Honour, MD  insulin aspart protamine- aspart (NOVOLOG MIX 70/30) (70-30) 100 UNIT/ML injection Inject 0.15-0.3 mLs (15-30 Units total) into the skin 3 (three) times daily. Patient taking differently: Inject 20 Units into the skin 3 (three) times daily.  03/09/18   Terald Sleeper, PA-C  Insulin Syringe-Needle U-100 (RELION INSULIN SYR 0.3ML/31G) 31G X 5/16" 0.3 ML MISC USE ONE SYRINGE EACH TIME THREE TIMES DAILY 10/05/18   Terald Sleeper, PA-C  metFORMIN (GLUCOPHAGE) 1000 MG tablet Take 1 tablet (1,000 mg total) by mouth 2 (two) times daily with a meal. 10/05/18   Terald Sleeper, PA-C  pantoprazole (PROTONIX) 40 MG tablet Take 1 tablet (40 mg total) by mouth daily. 10/05/18   Terald Sleeper, PA-C  rOPINIRole (REQUIP) 0.5  MG tablet Take 1-2 tablets (0.5-1 mg total) by mouth at bedtime. 10/05/18   Terald Sleeper, PA-C  sacubitril-valsartan (ENTRESTO) 49-51 MG Take 1 tablet by mouth 2 (two) times daily. 03/22/19   Arnoldo Lenis, MD  spironolactone (ALDACTONE) 25 MG tablet Take 0.5 tablets (12.5 mg total) by mouth daily. 03/22/19 03/21/20  Arnoldo Lenis, MD    Allergies    Patient has no known allergies.  Review of Systems   Review of Systems  Constitutional: Negative for appetite change.  HENT: Negative for congestion.   Respiratory: Positive for shortness of breath.   Cardiovascular: Positive for leg swelling. Negative for chest pain.  Gastrointestinal: Negative for abdominal pain.  Genitourinary: Positive for decreased urine volume. Negative for difficulty urinating.  Musculoskeletal: Negative  for back pain.  Neurological: Negative for weakness.  Psychiatric/Behavioral: Negative for confusion.    Physical Exam Updated Vital Signs BP 115/84 (BP Location: Left Arm)   Pulse 54   Temp 97.6 F (36.4 C) (Oral)   Resp 13   Ht 5\' 9"  (1.753 m)   Wt (!) 120.7 kg   SpO2 98%   BMI 39.28 kg/m   Physical Exam Vitals and nursing note reviewed.  HENT:     Head: Normocephalic.  Cardiovascular:     Rate and Rhythm: Normal rate. Rhythm irregular.  Pulmonary:     Comments: Mild rales bilateral bases. Chest:     Chest wall: No tenderness.  Abdominal:     Tenderness: There is no abdominal tenderness.  Musculoskeletal:     Right lower leg: Edema present.     Left lower leg: Edema present.  Skin:    General: Skin is warm.     Capillary Refill: Capillary refill takes less than 2 seconds.  Neurological:     Mental Status: He is alert and oriented to person, place, and time.     ED Results / Procedures / Treatments   Labs (all labs ordered are listed, but only abnormal results are displayed) Labs Reviewed  BASIC METABOLIC PANEL - Abnormal; Notable for the following components:      Result Value     Sodium 130 (*)    Chloride 94 (*)    CO2 18 (*)    Glucose, Bld 180 (*)    BUN 90 (*)    Creatinine, Ser 4.10 (*)    Calcium 8.1 (*)    GFR calc non Af Amer 15 (*)    GFR calc Af Amer 18 (*)    Anion gap 18 (*)    All other components within normal limits  CBC WITH DIFFERENTIAL/PLATELET - Abnormal; Notable for the following components:   WBC 12.7 (*)    Hemoglobin 12.8 (*)    MCV 79.8 (*)    MCH 25.5 (*)    Neutro Abs 8.6 (*)    Monocytes Absolute 1.3 (*)    All other components within normal limits  BRAIN NATRIURETIC PEPTIDE - Abnormal; Notable for the following components:   B Natriuretic Peptide 2,047.0 (*)    All other components within normal limits  TROPONIN I (HIGH SENSITIVITY) - Abnormal; Notable for the following components:   Troponin I (High Sensitivity) 18 (*)    All other components within normal limits  TROPONIN I (HIGH SENSITIVITY)    EKG EKG Interpretation  Date/Time:  Monday September 12 2019 04:49:14 EDT Ventricular Rate:  88 PR Interval:    QRS Duration: 151 QT Interval:  458 QTC Calculation: 552 R Axis:   86 Text Interpretation: rhythm indeterminate Paired ventricular premature complexes Nonspecific intraventricular conduction delay Repol abnrm suggests ischemia, anterolateral Confirmed by [name] [phone]) on 09/12/2019 5:05:20 AM   Radiology DG Chest Port 1 View  Result Date: 09/12/2019 CLINICAL DATA:  Shortness of breath and increased fluid EXAM: PORTABLE CHEST 1 VIEW COMPARISON:  06/16/2019 FINDINGS: Cardiomegaly. Prior CABG. Vascular congestion without Kerley lines or effusion. No consolidation or pneumothorax. IMPRESSION: Chronic cardiomegaly with mild vascular congestion. Electronically Signed   By: Monte Fantasia M.D.   On: 09/12/2019 05:53    Procedures Procedures (including critical care time)  Medications Ordered in ED Medications - No data to display  ED Course  I have reviewed the triage vital signs and the nursing  notes.  Pertinent labs & imaging  results that were available during my care of the patient were reviewed by me and considered in my medical decision making (see chart for details).    MDM Rules/Calculators/A&P                          Patient presents with shortness of breath.  Recent admission to [facility] for the same.  On oxygen at home.  History of COPD and CHF.  BNP elevated and chest x-ray shows volume overload, however has a new kidney injury with creatinine up to 4 looks like it was 1.8 at baseline but has been normal recently also.  Does not appear to be on oxygen at rest right now and sats are 100, however with acute injury of his kidneys and volume overload patient will require admission to the hospital.  Will discuss with hospitalist. Final Clinical Impression(s) / ED Diagnoses Final diagnoses:  SOB (shortness of breath)  AKI (acute kidney injury) [facility])    Rx / [place] Orders ED Discharge Orders    None       Davonna Belling, MD 09/12/19 [phone]

## 2019-09-12 NOTE — Progress Notes (Signed)
Inpatient Diabetes Program Recommendations  AACE/ADA: New Consensus Statement on Inpatient Glycemic Control (2015)  Target Ranges:  Prepandial:   less than 140 mg/dL      Peak postprandial:   less than 180 mg/dL (1-2 hours)      Critically ill patients:  140 - 180 mg/dL   Lab Results  Component Value Date   GLUCAP 190 (H) 09/12/2019   HGBA1C 9.2 (H) 09/12/2019    Review of Glycemic Control Results for JAMAIR, CATO (MRN [id number]) as of 09/12/2019 13:16  Ref. Range 09/12/2019 07:59 09/12/2019 11:04  Glucose-Capillary Latest Ref Range: 70 - 99 mg/dL 182 (H) 190 (H)   Diabetes history: DM 2 Outpatient Diabetes medications:  Novolin 70/30 20 units tid, Metformin 1000 mg bid Current orders for Inpatient glycemic control:  Novolog sensitive tid with meals and HS  Inpatient Diabetes Program Recommendations:    Consider adding Novolog 70/30 10 units bid with breakfast and supper when appropriate.   Thanks,  Adah Perl, RN, BC-ADM Inpatient Diabetes Coordinator Pager [phone] (8a-5p)

## 2019-09-12 NOTE — H&P (Signed)
History and Physical    Shane Frye LZJ:[id number] DOB: 01-Jul-1965 DOA: 09/12/2019  PCP: Caryl Bis, MD   Patient coming from: home   I have personally briefly reviewed patient's old medical records in [place]  Chief Complaint: Shortness of breath and swelling.  HPI: Shane Frye is a 54 y.o. male with medical history significant of COPD, depression/anxiety, type 2 diabetes mellitus with nephropathy, gastroesophageal reflux disease, hypertension, hyperlipidemia, history of coronary artery disease and systolic heart failure; who presented to the hospital secondary to worsening shortness of breath (at rest and on exertion), and increased swelling.  Patient reports couple recent admissions to [facility] for similar symptoms and despite intervention while hospitalized no significant improvement of his symptoms after discharge.  Patient reports to be taking his medications as prescribed, but expressed having some diet indiscretion.  He has noticed an increase in over 20 pounds of weight, lower extremity swelling significantly with blister development from fluid overload and also positive orthopnea.    Patient denies fever, chills, cough, dysuria, hematuria, melena, hematochezia, abdominal pain, chest pain, headaches, focal weakness, sick contacts or any other complaints.  ED Course: Chest x-ray demonstrating vascular congestion along with cardiomegaly, BMP over 2000, on examination fluid overload has been appreciated and his blood work is demonstrating mild hyponatremia, metabolic acidosis and significant worsening of renal function with a creatinine up to 4.10.  TRH has been consulted to admit patient for further evaluation and management.  Review of Systems: As per HPI otherwise all other systems reviewed and are negative.  Past Medical History:  Diagnosis Date  . Anxiety   . CHF (congestive heart failure) ([place])   . COPD (chronic obstructive pulmonary disease)  ([place])   . Depression   . Diabetes mellitus   . GERD (gastroesophageal reflux disease)   . High cholesterol   . Hypertension   . Insomnia   . Myocardial infarction ([place]) 11/04/13  . Neuropathy     Past Surgical History:  Procedure Laterality Date  . CARDIOVERSION N/A 11/06/2013   Procedure: CARDIOVERSION;  Surgeon: Thayer Headings, MD;  Location: [facility];  Service: Cardiovascular;  Laterality: N/A;  . CARPAL TUNNEL RELEASE  04/2015  . COLONOSCOPY N/A 05/11/2014   Procedure: COLONOSCOPY;  Surgeon: Rogene Houston, MD;  [facility];  Service: Endoscopy;  Laterality: N/A;  930  . CORONARY ARTERY BYPASS GRAFT N/A 11/09/2013   Procedure: CORONARY ARTERY BYPASS GRAFTING (CABG);  Surgeon: Melrose Nakayama, MD;  Location: [facility];  Service: Open Heart Surgery;  Laterality: N/A;  . INTRAOPERATIVE TRANSESOPHAGEAL ECHOCARDIOGRAM N/A 11/09/2013   Procedure: INTRAOPERATIVE TRANSESOPHAGEAL ECHOCARDIOGRAM;  Surgeon: Melrose Nakayama, MD;  Location: [facility];  Service: Open Heart Surgery;  Laterality: N/A;  . LEFT HEART CATHETERIZATION WITH CORONARY ANGIOGRAM N/A 11/04/2013   Procedure: LEFT HEART CATHETERIZATION WITH CORONARY ANGIOGRAM;  Surgeon: Troy Sine, MD;  Location: [facility] C[facility] LAB;  Service: Cardiovascular;  Laterality: N/A;  . NO PAST SURGERIES    . RIGHT/LEFT HEART CATH AND CORONARY/GRAFT ANGIOGRAPHY N/A 07/14/2018   Procedure: RIGHT/LEFT HEART CATH AND CORONARY/GRAFT ANGIOGRAPHY;  Surgeon: Wellington Hampshire, MD;  Location: [facility] CV LAB;  Service: Cardiovascular;  Laterality: N/A;    Social History  reports that he quit smoking about 13 months ago. His smoking use included cigarettes. He smoked 0.50 packs per day. He quit smokeless tobacco use about 5 years ago. He reports that he does not drink alcohol and does not use drugs.  No Known Allergies  Family History  Problem Relation Age of Onset  . Hypertension Paternal Grandfather   . Hypertension Brother   . Diabetes  Brother   . Neuropathy Neg Hx     Prior to Admission medications   Medication Sig Start Date End Date Taking? Authorizing Provider  albuterol (VENTOLIN HFA) 108 (90 Base) MCG/ACT inhaler Inhale 2 puffs into the lungs every 6 (six) hours as needed for wheezing or shortness of breath. 06/29/18   Terald Sleeper, PA-C  aspirin 81 MG tablet Take 81 mg by mouth daily.    [provider]  atorvastatin (LIPITOR) 80 MG tablet Take 1 tablet (80 mg total) by mouth daily at 6 PM. 03/22/19   Branch, Alphonse Guild, MD  carvedilol (COREG) 6.25 MG tablet Take 2 tablets (12.5 mg total) by mouth 2 (two) times daily. 10/05/18   Terald Sleeper, PA-C  citalopram (CELEXA) 20 MG tablet Take 1 tablet (20 mg total) by mouth daily. 10/05/18   Terald Sleeper, PA-C  furosemide (LASIX) 20 MG tablet Take 1 tablet (20 mg total) by mouth daily. 07/21/19   Arnoldo Lenis, MD  glucose blood test strip Use as instructed 08/07/14   Wardell Honour, MD  insulin aspart protamine- aspart (NOVOLOG MIX 70/30) (70-30) 100 UNIT/ML injection Inject 0.15-0.3 mLs (15-30 Units total) into the skin 3 (three) times daily. Patient taking differently: Inject 20 Units into the skin 3 (three) times daily.  03/09/18   Terald Sleeper, PA-C  Insulin Syringe-Needle U-100 (RELION INSULIN SYR 0.3ML/31G) 31G X 5/16" 0.3 ML MISC USE ONE SYRINGE EACH TIME THREE TIMES DAILY 10/05/18   Terald Sleeper, PA-C  metFORMIN (GLUCOPHAGE) 1000 MG tablet Take 1 tablet (1,000 mg total) by mouth 2 (two) times daily with a meal. 10/05/18   Terald Sleeper, PA-C  pantoprazole (PROTONIX) 40 MG tablet Take 1 tablet (40 mg total) by mouth daily. 10/05/18   Terald Sleeper, PA-C  rOPINIRole (REQUIP) 0.5 MG tablet Take 1-2 tablets (0.5-1 mg total) by mouth at bedtime. 10/05/18   Terald Sleeper, PA-C  sacubitril-valsartan (ENTRESTO) 49-51 MG Take 1 tablet by mouth 2 (two) times daily. 03/22/19   Arnoldo Lenis, MD  spironolactone (ALDACTONE) 25 MG tablet Take 0.5 tablets (12.5 mg  total) by mouth daily. 03/22/19 03/21/20  Arnoldo Lenis, MD    Physical Exam: Vitals:   09/12/19 0530 09/12/19 0610 09/12/19 0620 09/12/19 0630  BP:      Pulse: 79 84 95 54  Resp: 14 16 15 13   Temp:      TempSrc:      SpO2: 96% 96% 100% 98%  Weight:      Height:        Constitutional: Complaining of shortness of breath and orthopnea; feeling fluid overload and swollen.  No chest pain.  No fever Vitals:   09/12/19 0530 09/12/19 0610 09/12/19 0620 09/12/19 0630  BP:      Pulse: 79 84 95 54  Resp: 14 16 15 13   Temp:      TempSrc:      SpO2: 96% 96% 100% 98%  Weight:      Height:       Eyes: PERRL, lids and conjunctivae normal, no icterus, no nystagmus. ENMT: Mucous membranes are moist. Posterior pharynx clear of any exudate or lesions. Neck: normal, supple, no masses, no thyromegaly, unable to properly assess for JVD due to body habitus. Respiratory: No wheezing, positive scattered rhonchi and  fine crackles at the bases.  No using accessory muscle. Cardiovascular: Regular rate and rhythm, no rubs, no gallops, no murmurs. Abdomen: Obese, no tenderness, no masses palpated. No hepatosplenomegaly. Bowel sounds positive.  Increased abdominal girth appreciated on exam. Musculoskeletal: no clubbing / cyanosis.  2-3+ edema all the way to his thighs bilaterally.  Normal muscle tone. Skin: no petechiae; positive scabs/blisters that have popped up open on both legs and feet. Neurologic: CN 2-12 grossly intact. Sensation intact, DTR normal. Strength 5/5 in all 4.  Psychiatric: Normal judgment and insight. Alert and oriented x 3. Normal mood.    Labs on Admission: I have personally reviewed following labs and imaging studies  CBC: Recent Labs  Lab 09/12/19 0512  WBC 12.7*  NEUTROABS 8.6*  HGB 12.8*  HCT 40.0  MCV 79.8*  PLT 268    Basic Metabolic Panel: Recent Labs  Lab 09/12/19 0512  NA 130*  K 4.0  CL 94*  CO2 18*  GLUCOSE 180*  BUN 90*  CREATININE 4.10*  CALCIUM  8.1*    GFR: Estimated Creatinine Clearance: 25.8 mL/min (A) (by C-G formula based on SCr of 4.1 mg/dL (H)).  Urine analysis:    Component Value Date/Time   COLORURINE YELLOW 07/12/2018 Bureau 07/12/2018 1519   LABSPEC 1.020 07/12/2018 1519   PHURINE 6.0 07/12/2018 1519   GLUCOSEU >=500 (A) 07/12/2018 1519   HGBUR NEGATIVE 07/12/2018 1519   BILIRUBINUR NEGATIVE 07/12/2018 1519   KETONESUR NEGATIVE 07/12/2018 1519   PROTEINUR NEGATIVE 07/12/2018 1519   UROBILINOGEN 1.0 11/08/2013 1549   NITRITE NEGATIVE 07/12/2018 1519   LEUKOCYTESUR NEGATIVE 07/12/2018 1519    Radiological Exams on Admission: DG Chest Port 1 View  Result Date: 09/12/2019 CLINICAL DATA:  Shortness of breath and increased fluid EXAM: PORTABLE CHEST 1 VIEW COMPARISON:  06/16/2019 FINDINGS: Cardiomegaly. Prior CABG. Vascular congestion without Kerley lines or effusion. No consolidation or pneumothorax. IMPRESSION: Chronic cardiomegaly with mild vascular congestion. Electronically Signed   By: Monte Fantasia M.D.   On: 09/12/2019 05:53    EKG: Independently reviewed.  No acute ischemic changes appreciated; sinus rhythm.  Assessment/Plan 1-Acute on chronic systolic CHF (congestive heart failure) (HCC) -Complaining of shortness of breath, orthopnea and with signs of fluid overload. -Patient also reporting decrease in his urine output. -Admitted to telemetry bed, holding Entresto Aldactone in the setting of acute renal failure and started on IV Lasix. -Cardiology service and nephrology service has been consulted -Repeating 2D echo -No complaints of chest pain and with a stable troponin. -Follow daily weights, low-sodium diet, fluid restriction and strict intake and output.  2-acute on chronic renal failure/hyponatremia and metabolic acidosis. -stage 2-3a transitioning stage at baseline -renal failure due to diabetes and acute exacerbation with concerns for cardiorenal syndrome. -holding  nephrotoxic agents -will check renal US and UA -nephrology service consulted and will follow recommendations.  3-HLD (hyperlipidemia) -Continue statins  4-Obesity -Body mass index is 39.28 kg/m. -Low calorie diet, portion control and increase physical activity has been discussed with patient.  5-GERD (gastroesophageal reflux disease) -Continue PPI.  6-type 2 diabetes with nephropathy -Holding oral hypoglycemic agent while inpatient -Check A1c -Start on sliding scale insulin and follow CBG's.  7-hx of CAD -Continue aspirin, statins and continue beta-blocker -Denies chest pain and no acute ischemic changes appreciated on EKG. -Stable troponin level no suggesting ischemia.   DVT prophylaxis: Heparin  Code Status:   Full code.  Family Communication:  No family at bedside  Disposition Plan:  Patient is from:  Home   Anticipated DC to:  Home   Anticipated DC date:  To be determine   Anticipated DC barriers: Renal function, volume status and respiratory system stabilization. Consults called:  Cardiology service and nephrology service.  Admission status:  Telemetry bed  Severity of Illness: Moderate illness with high changes for decompensation. Patient with acute on chronic systolic HF, severe fluid overload status and complaints of orthopnea. He also expressed worsening DOE and is demonstrating acute on CKD (stage 2-3a at baseline (Cr up to 4.10).    Barton Dubois MD [facility]  How to contact the [facility] Attending or Consulting provider [place] or covering provider during after hours [place], for this patient?   1. Check the care team in [facility] and look for a) attending/consulting TR[facility] provider listed and b) the [facility] team listed 2. Log into [email or web address] and use [place]'s universal password to access. If you do not have the password, please contact the hospital operator. 3. Locate the [facility] provider you are looking for under [facility] and page to a number that you  can be directly reached. 4. If you still have difficulty reaching the provider, please page the [facility] (Director on Call) for the Hospitalists listed on amion for assistance.  09/12/2019, 8:07 AM

## 2019-09-12 NOTE — Consult Note (Addendum)
WOC Nurse Consult Note: Reason for Consult: Partial and full thickness areas of skin loss on left leg: posterior thigh, scattered areas on the lower legs and a ruptured blister at the right anterior foot, base of 2nd digit. Wound type: infectious vs viral vs trauma Pressure Injury POA: N/A Measurement: Base of right foot, second digit: 0.6cm x 1cm x 0.1cm Several scattered areas on RLE with the largest measuring 0.5cm x 1.5cm x 0.1cm Posterior left thigh: Bedside RN to measure today. Wound bed: pink, moist with small to moderate serous exudate Drainage (amount, consistency, odor) See above Periwound:erythema, edema, linear excoriation at thigh Dressing procedure/placement/frequency: I have provided conservative topical care guidance appropriate for wounds using soap and water to cleanse, saline or other potable water to rinse and pat dry followed by single layer of xeroform gauze Kellie Simmering # [phone]) and dry gauze topper.  Securement to the LE and foot can be with Kerlix roll gauze/paper tape and the poster thigh with silicone foam dressing. I have additionally provided guidance for the placement of a sacral silicone foam dressing and for floatation of the bilateral heels to prevent pressure injury.  [place] nursing team will not follow, but will remain available to this patient, the nursing and medical teams.  Please re-consult if needed. Thanks, Maudie Flakes, MSN, RN, [place], [facility] Abbott  Pager# [phone]

## 2019-09-12 NOTE — ED Triage Notes (Addendum)
Pt c/o increased fluid "all over him" and its hard for him to breath. Pt states he was just released from [facility] 2 days ago. Pt also states that he has been falling at home. Pt wears 2L [place] chronic at home but did not come to the ED with her personal tank tonight.

## 2019-09-12 NOTE — Consult Note (Signed)
[facility] Renal Consultation Note  Requesting MD: Dyann Kief Indication for Consultation: AKI  HPI:  Shane Frye is a 54 y.o. male with multiple medical problems to include COPD, DM, CAD with ICM-  EF of 25%, social challenges-  homeless at times including now-  substance abuse, continued tobacco abuse.  He reports being in the hospital a lot of late-  Most recently at [facility] when he left AMA on 7/23 because they"werent taking care of him"  Pt said he was passing out frequently and they werent able to stop it.  It seems that volume overload is also one of his main complaints. At the end of April, he was noted to have normal kidney function-  crt less than 1.  I was able to find at [facility] he had some AKI with crt peaking at 2.46-  Improved to 1.27 but then steadily worsened to a level of 1.82 on 09/09/19  Today after a syncopal episode pts crt was over 4.  He is on Entresto and has been long term--- does not think that is has been stopped.  He endorses decreased UOP of late.  Work  Up here shows soft BP-  U/A is bland with 30 of protein and no blood-  Renal ultrasound is unremarkable.  He is sitting up on the side of the bed-  Does not seem uremic but is not the best historian-  Adequate O2 sat on RA  Creatinine, Ser  Date/Time Value Ref Range Status  09/12/2019 05:12 AM 4.10 (H) 0.61 - 1.24 mg/dL Final  06/16/2019 06:40 PM 0.69 0.61 - 1.24 mg/dL Final  07/16/2018 03:29 AM 1.00 0.61 - 1.24 mg/dL Final  07/15/2018 03:04 AM 1.02 0.61 - 1.24 mg/dL Final  07/13/2018 04:33 AM 0.95 0.61 - 1.24 mg/dL Final  07/12/2018 03:50 PM 0.97 0.61 - 1.24 mg/dL Final  07/12/2018 01:57 PM 1.05 0.61 - 1.24 mg/dL Final  07/12/2018 10:04 AM 1.14 0.61 - 1.24 mg/dL Final  03/09/2018 08:59 AM 0.88 0.76 - 1.27 mg/dL Final  03/11/2017 09:35 AM 0.80 0.76 - 1.27 mg/dL Final  02/08/2016 10:22 AM 0.84 0.76 - 1.27 mg/dL Final  03/05/2015 11:32 AM 0.70 (L) 0.76 - 1.27 mg/dL Final  06/08/2014 09:08 AM 0.70  0.50 - 1.35 mg/dL Final  05/23/2014 09:05 AM 0.98 0.50 - 1.35 mg/dL Final  11/14/2013 12:40 AM 0.71 0.50 - 1.35 mg/dL Final  11/13/2013 03:37 AM 0.84 0.50 - 1.35 mg/dL Final    Comment:    DELTA CHECK NOTED  11/12/2013 04:50 AM 1.30 0.50 - 1.35 mg/dL Final  11/11/2013 05:00 AM 1.17 0.50 - 1.35 mg/dL Final  11/10/2013 05:50 PM 1.50 (H) 0.50 - 1.35 mg/dL Final  11/10/2013 05:00 PM 1.49 (H) 0.50 - 1.35 mg/dL Final    Comment:    DELTA CHECK NOTED  11/10/2013 03:15 AM 0.70 0.50 - 1.35 mg/dL Final  11/09/2013 09:54 PM 0.80 0.50 - 1.35 mg/dL Final  11/09/2013 09:50 PM 0.75 0.50 - 1.35 mg/dL Final  11/09/2013 02:34 PM 0.70 0.50 - 1.35 mg/dL Final  11/09/2013 01:27 PM 0.60 0.50 - 1.35 mg/dL Final  11/09/2013 12:01 PM 0.70 0.50 - 1.35 mg/dL Final  11/09/2013 11:10 AM 0.70 0.50 - 1.35 mg/dL Final  11/09/2013 08:59 AM 0.70 0.50 - 1.35 mg/dL Final  11/08/2013 09:35 PM 0.71 0.50 - 1.35 mg/dL Final  11/06/2013 03:00 AM 0.69 0.50 - 1.35 mg/dL Final  11/05/2013 03:18 AM 0.67 0.50 - 1.35 mg/dL Final     PMHx:  Past Medical History:  Diagnosis Date  . Anxiety   . CHF (congestive heart failure) ([place])   . COPD (chronic obstructive pulmonary disease) ([place])   . Depression   . Diabetes mellitus   . GERD (gastroesophageal reflux disease)   . High cholesterol   . Hypertension   . Insomnia   . Myocardial infarction ([place]) 11/04/13  . Neuropathy     Past Surgical History:  Procedure Laterality Date  . CARDIOVERSION N/A 11/06/2013   Procedure: CARDIOVERSION;  Surgeon: Thayer Headings, MD;  Location: [place];  Service: Cardiovascular;  Laterality: N/A;  . CARPAL TUNNEL RELEASE  04/2015  . COLONOSCOPY N/A 05/11/2014   Procedure: COLONOSCOPY;  Surgeon: Rogene Houston, MD;  [facility];  Service: Endoscopy;  Laterality: N/A;  930  . CORONARY ARTERY BYPASS GRAFT N/A 11/09/2013   Procedure: CORONARY ARTERY BYPASS GRAFTING (CABG);  Surgeon: Melrose Nakayama, MD;  Location: [place];  Service:  Open Heart Surgery;  Laterality: N/A;  . INTRAOPERATIVE TRANSESOPHAGEAL ECHOCARDIOGRAM N/A 11/09/2013   Procedure: INTRAOPERATIVE TRANSESOPHAGEAL ECHOCARDIOGRAM;  Surgeon: Melrose Nakayama, MD;  Location: [place];  Service: Open Heart Surgery;  Laterality: N/A;  . LEFT HEART CATHETERIZATION WITH CORONARY ANGIOGRAM N/A 11/04/2013   Procedure: LEFT HEART CATHETERIZATION WITH CORONARY ANGIOGRAM;  Surgeon: Troy Sine, MD;  Location: [facility] CATH LAB;  Service: Cardiovascular;  Laterality: N/A;  . NO PAST SURGERIES    . RIGHT/LEFT HEART CATH AND CORONARY/GRAFT ANGIOGRAPHY N/A 07/14/2018   Procedure: RIGHT/LEFT HEART CATH AND CORONARY/GRAFT ANGIOGRAPHY;  Surgeon: Wellington Hampshire, MD;  Location: [place] CV LAB;  Service: Cardiovascular;  Laterality: N/A;    Family Hx:  Family History  Problem Relation Age of Onset  . Hypertension Paternal Grandfather   . Hypertension Brother   . Diabetes Brother   . Neuropathy Neg Hx     Social History:  reports that he quit smoking about 13 months ago. His smoking use included cigarettes. He smoked 0.50 packs per day. He quit smokeless tobacco use about 5 years ago. He reports that he does not drink alcohol and does not use drugs.  Allergies: No Known Allergies  Medications: Prior to Admission medications   Medication Sig Start Date End Date Taking? Authorizing Provider  albuterol (VENTOLIN HFA) 108 (90 Base) MCG/ACT inhaler Inhale 2 puffs into the lungs every 6 (six) hours as needed for wheezing or shortness of breath. 06/29/18   Terald Sleeper, PA-C  aspirin 81 MG tablet Take 81 mg by mouth daily.    [provider]  atorvastatin (LIPITOR) 80 MG tablet Take 1 tablet (80 mg total) by mouth daily at 6 PM. 03/22/19   Branch, Alphonse Guild, MD  carvedilol (COREG) 6.25 MG tablet Take 2 tablets (12.5 mg total) by mouth 2 (two) times daily. 10/05/18   Terald Sleeper, PA-C  citalopram (CELEXA) 20 MG tablet Take 1 tablet (20 mg total) by mouth daily. 10/05/18    Terald Sleeper, PA-C  furosemide (LASIX) 20 MG tablet Take 1 tablet (20 mg total) by mouth daily. 07/21/19   Arnoldo Lenis, MD  glucose blood test strip Use as instructed 08/07/14   Wardell Honour, MD  insulin aspart protamine- aspart (NOVOLOG MIX 70/30) (70-30) 100 UNIT/ML injection Inject 0.15-0.3 mLs (15-30 Units total) into the skin 3 (three) times daily. Patient taking differently: Inject 20 Units into the skin 3 (three) times daily.  03/09/18   Terald Sleeper, PA-C  Insulin Syringe-Needle U-100 (RELION  INSULIN SYR 0.3ML/31G) 31G X 5/16" 0.3 ML MISC USE ONE SYRINGE EACH TIME THREE TIMES DAILY 10/05/18   Terald Sleeper, PA-C  metFORMIN (GLUCOPHAGE) 1000 MG tablet Take 1 tablet (1,000 mg total) by mouth 2 (two) times daily with a meal. 10/05/18   Terald Sleeper, PA-C  pantoprazole (PROTONIX) 40 MG tablet Take 1 tablet (40 mg total) by mouth daily. 10/05/18   Terald Sleeper, PA-C  rOPINIRole (REQUIP) 0.5 MG tablet Take 1-2 tablets (0.5-1 mg total) by mouth at bedtime. 10/05/18   Terald Sleeper, PA-C  sacubitril-valsartan (ENTRESTO) 49-51 MG Take 1 tablet by mouth 2 (two) times daily. 03/22/19   Arnoldo Lenis, MD  spironolactone (ALDACTONE) 25 MG tablet Take 0.5 tablets (12.5 mg total) by mouth daily. 03/22/19 03/21/20  Arnoldo Lenis, MD    I have reviewed the patient's current medications.  Labs:  Results for orders placed or performed during the hospital encounter of 09/12/19 (from the past 48 hour(s))  Basic metabolic panel     Status: Abnormal   Collection Time: 09/12/19  5:12 AM  Result Value Ref Range   Sodium 130 (L) 135 - 145 mmol/L   Potassium 4.0 3.5 - 5.1 mmol/L   Chloride 94 (L) 98 - 111 mmol/L   CO2 18 (L) 22 - 32 mmol/L   Glucose, Bld 180 (H) 70 - 99 mg/dL    Comment: Glucose reference range applies only to samples taken after fasting for at least 8 hours.   BUN 90 (H) 6 - 20 mg/dL   Creatinine, Ser 4.10 (H) 0.61 - 1.24 mg/dL   Calcium 8.1 (L) 8.9 - 10.3 mg/dL   GFR  calc non Af Amer 15 (L) >60 mL/min   GFR calc Af Amer 18 (L) >60 mL/min   Anion gap 18 (H) 5 - 15    Comment: Performed at [facility], [address], [place], [place] [address]  CBC with Differential/Platelet     Status: Abnormal   Collection Time: 09/12/19  5:12 AM  Result Value Ref Range   WBC 12.7 (H) 4.0 - 10.5 K/uL   RBC 5.01 4.22 - 5.81 MIL/uL   Hemoglobin 12.8 (L) 13.0 - 17.0 g/dL   HCT 40.0 39 - 52 %   MCV 79.8 (L) 80.0 - 100.0 fL   MCH 25.5 (L) 26.0 - 34.0 pg   MCHC 32.0 30.0 - 36.0 g/dL   RDW 15.5 11.5 - 15.5 %   Platelets 392 150 - 400 K/uL   nRBC 0.2 0.0 - 0.2 %   Neutrophils Relative % 66 %   Neutro Abs 8.6 (H) 1.7 - 7.7 K/uL   Lymphocytes Relative 21 %   Lymphs Abs 2.6 0.7 - 4.0 K/uL   Monocytes Relative 11 %   Monocytes Absolute 1.3 (H) 0 - 1 K/uL   Eosinophils Relative 1 %   Eosinophils Absolute 0.1 0 - 0 K/uL   Basophils Relative 0 %   Basophils Absolute 0.0 0 - 0 K/uL   Immature Granulocytes 1 %   Abs Immature Granulocytes 0.06 0.00 - 0.07 K/uL    Comment: Performed at [facility], [address] [place], [place] [address]  Troponin I (High Sensitivity)     Status: Abnormal   Collection Time: 09/12/19  5:12 AM  Result Value Ref Range   Troponin I (High Sensitivity) 18 (H) <18 ng/L    Comment: (NOTE) Elevated high sensitivity troponin I (hsTnI) values and significant  changes across serial measurements  may suggest ACS but many other  chronic and acute conditions are known to elevate hsTnI results.  Refer to the "Links" section for chest pain algorithms and additional  guidance. Performed at [facility], [address] [place], [place] [address]   Brain natriuretic peptide     Status: Abnormal   Collection Time: 09/12/19  5:12 AM  Result Value Ref Range   B Natriuretic Peptide 2,047.0 (H) 0.0 - 100.0 pg/mL    Comment: Performed at [facility], [address] [place], [place] [address]  Troponin I (High Sensitivity)     Status: None   Collection  Time: 09/12/19  7:15 AM  Result Value Ref Range   Troponin I (High Sensitivity) 17 <18 ng/L    Comment: (NOTE) Elevated high sensitivity troponin I (hsTnI) values and significant  changes across serial measurements may suggest ACS but many other  chronic and acute conditions are known to elevate hsTnI results.  Refer to the "Links" section for chest pain algorithms and additional  guidance. Performed at [facility], [address] [place], [place] [address]   Magnesium     Status: None   Collection Time: 09/12/19  7:29 AM  Result Value Ref Range   Magnesium 1.9 1.7 - 2.4 mg/dL    Comment: Performed at [facility], [address] [place],  [address]  CBG monitoring, ED     Status: Abnormal   Collection Time: 09/12/19  7:59 AM  Result Value Ref Range   Glucose-Capillary 182 (H) 70 - 99 mg/dL    Comment: Glucose reference range applies only to samples taken after fasting for at least 8 hours.  SARS Coronavirus 2 by RT PCR (hospital order, performed in [facility] hospital lab) Nasopharyngeal Nasopharyngeal Swab     Status: None   Collection Time: 09/12/19  8:07 AM   Specimen: Nasopharyngeal Swab  Result Value Ref Range   SARS Coronavirus 2 NEGATIVE NEGATIVE    Comment: (NOTE) SARS-CoV-2 target nucleic acids are NOT DETECTED.  The SARS-CoV-2 RNA is generally detectable in upper and lower respiratory specimens during the acute phase of infection. The lowest concentration of SARS-CoV-2 viral copies this assay can detect is 250 copies / mL. A negative result does not preclude SARS-CoV-2 infection and should not be used as the sole basis for treatment or other patient management decisions.  A negative result may occur with improper specimen collection / handling, submission of specimen other than nasopharyngeal swab, presence of viral mutation(s) within the areas targeted by this assay, and inadequate number of viral copies (<250 copies / mL). A negative result must be combined  with clinical observations, patient history, and epidemiological information.  Fact Sheet for Patients:   StrictlyIdeas.no  Fact Sheet for Healthcare Providers: BankingDealers.co.za  This test is not yet approved or  cleared by the [place] FDA and has been authorized for detection and/or diagnosis of SARS-CoV-2 by FDA under an Emergency Use Authorization (EUA).  This EUA will remain in effect (meaning this test can be used) for the duration of the COVID-19 declaration under Section 564(b)(1) of the Act, 21 U.S.C. section 360bbb-3(b)(1), unless the authorization is terminated or revoked sooner.  Performed at [facility], [address], [place],  [address]   Urinalysis, Routine w reflex microscopic     Status: Abnormal   Collection Time: 09/12/19  8:07 AM  Result Value Ref Range   Color, Urine YELLOW YELLOW   APPearance HAZY (A) CLEAR   Specific Gravity, Urine 1.015  1.005 - 1.030   pH 5.0 5.0 - 8.0   Glucose, UA NEGATIVE NEGATIVE mg/dL   Hgb urine dipstick NEGATIVE NEGATIVE   Bilirubin Urine NEGATIVE NEGATIVE   Ketones, ur NEGATIVE NEGATIVE mg/dL   Protein, ur 30 (A) NEGATIVE mg/dL   Nitrite NEGATIVE NEGATIVE   Leukocytes,Ua NEGATIVE NEGATIVE   WBC, UA 0-5 0 - 5 WBC/hpf   Bacteria, UA RARE (A) NONE SEEN   Mucus PRESENT    Hyaline Casts, UA PRESENT     Comment: Performed at [facility], [address] [place], [place] [address]  Glucose, capillary     Status: Abnormal   Collection Time: 09/12/19 11:04 AM  Result Value Ref Range   Glucose-Capillary 190 (H) 70 - 99 mg/dL    Comment: Glucose reference range applies only to samples taken after fasting for at least 8 hours.     ROS:  A comprehensive review of systems was negative except for: Cardiovascular: positive for lower extremity edema and syncope Musculoskeletal: positive for toe pain  Physical Exam: Vitals:   09/12/19 0630 09/12/19 1041  BP:  110/81   Pulse: 54 78  Resp: 13 20  Temp:  (!) 97.5 F (36.4 C)  SpO2: 98% 99%     General: alert -  Sitting on side of bed-  Distressed that he does not feel better after being in the hospital so much  HEENT: PERRLA, EOMI, mucous membranes moist  Neck:positive for JVD Heart: brady Lungs: moving air pretty well-  Abdomen: obese, soft non tender Extremities: [itting edema above knees  Skin: multiple abrasions  Neuro: alert,  slightly scattered   Assessment/Plan: 54 year old WM with CAD and cardiomyopathy-  Now presenting with decompensated heart failure and AKI 1.Renal- baseline normal renal function.  However, with recent hospitalization he did seem to suffer AKI that got better but was trending worse at the time of him leaving there.  He tells me that he has continued to take entresto - does not think it was ever held.  His U/A is bland and ultrasound is normal -  That along with his story of syncope and continued entresto makes this seem like a hemodynamic insult-  Possibly prolonged in nature that has led to ATN.  I would hold the Entresto and the aldactone for now-  Monitor BP and orthstatics closely as well as UOP and BP.  Hopefully with normal renal function at baseline we will see plateau and improvement soon.  No indications for dialysis at present  2. Hypertension/volume  - volume overloaded and hypotensive-  Holding entresto and aldactone-  Continue beta blocker.  Not hypoxic so no immediate need for diuresis-  Will likely not be achievable until kidney function rallies   3. Hyponatremia-  Likely due to volume overload.  Agree with loop diuretic-  But again may not be able to make much headway until renal injury is resolved     Louis Meckel 09/12/2019, 11:55 AM

## 2019-09-13 ENCOUNTER — Encounter (HOSPITAL_COMMUNITY): Payer: Self-pay | Admitting: Internal Medicine

## 2019-09-13 DIAGNOSIS — I10 Essential (primary) hypertension: Secondary | ICD-10-CM

## 2019-09-13 DIAGNOSIS — K219 Gastro-esophageal reflux disease without esophagitis: Secondary | ICD-10-CM | POA: Diagnosis not present

## 2019-09-13 DIAGNOSIS — I4892 Unspecified atrial flutter: Secondary | ICD-10-CM

## 2019-09-13 DIAGNOSIS — I2510 Atherosclerotic heart disease of native coronary artery without angina pectoris: Secondary | ICD-10-CM

## 2019-09-13 DIAGNOSIS — N179 Acute kidney failure, unspecified: Secondary | ICD-10-CM | POA: Diagnosis not present

## 2019-09-13 DIAGNOSIS — I5023 Acute on chronic systolic (congestive) heart failure: Secondary | ICD-10-CM

## 2019-09-13 DIAGNOSIS — J449 Chronic obstructive pulmonary disease, unspecified: Secondary | ICD-10-CM

## 2019-09-13 DIAGNOSIS — R0602 Shortness of breath: Secondary | ICD-10-CM | POA: Diagnosis not present

## 2019-09-13 LAB — GLUCOSE, CAPILLARY
Glucose-Capillary: 198 mg/dL — ABNORMAL HIGH (ref 70–99)
Glucose-Capillary: 202 mg/dL — ABNORMAL HIGH (ref 70–99)
Glucose-Capillary: 206 mg/dL — ABNORMAL HIGH (ref 70–99)
Glucose-Capillary: 202 mg/dL — ABNORMAL HIGH (ref 70–99)

## 2019-09-13 LAB — BASIC METABOLIC PANEL
Anion gap: 12 (ref 5–15)
BUN: 90 mg/dL — ABNORMAL HIGH (ref 6–20)
CO2: 23 mmol/L (ref 22–32)
Calcium: 7.6 mg/dL — ABNORMAL LOW (ref 8.9–10.3)
Chloride: 97 mmol/L — ABNORMAL LOW (ref 98–111)
Creatinine, Ser: 2.91 mg/dL — ABNORMAL HIGH (ref 0.61–1.24)
GFR calc Af Amer: 27 mL/min — ABNORMAL LOW (ref >60)
GFR calc non Af Amer: 23 mL/min — ABNORMAL LOW (ref >60)
Glucose, Bld: 219 mg/dL — ABNORMAL HIGH (ref 70–99)
Potassium: 3.5 mmol/L (ref 3.5–5.1)
Sodium: 132 mmol/L — ABNORMAL LOW (ref 135–145)

## 2019-09-13 MED ORDER — APIXABAN 5 MG PO TABS
5.0000 mg | ORAL_TABLET | Freq: Two times a day (BID) | ORAL | Status: DC
  Administered 2019-09-13 – 2019-09-15 (×5): 5 mg via ORAL
  Filled 2019-09-13 (×5): qty 1

## 2019-09-13 NOTE — Consult Note (Addendum)
Cardiology Consult    Patient ID: ERIVERTO BYRNES; [id number]; 1965/08/31   Admit date: 09/12/2019 Date of Consult: 09/13/2019  Primary Care Provider: Caryl Bis, MD Primary Cardiologist: Carlyle Dolly, MD   Patient Profile    Shane Frye is a 54 y.o. male with past medical history of chronic systolic CHF (EF 12-75% by echo in 06/2018, at 20% by repeat echo in 08/2019), CAD (s/p CABG in 10/2013 with cath in 06/2018 showing occluded SVG-OM, SVG-RI and SVG-PDA with patent LIMA-LAD and no targets for PCI or re-do CABG), post-operative atrial fibrillation (occurring in 2015), substance use (prior UDS positive for Cocaine), HTN and HLD who is being seen today for the evaluation of CHF at the request of Dr. Dyann Kief.   History of Present Illness    Shane Frye was last examined by Katina Dung, NP in 06/2019 following a recent hospitalization at [facility] for an acute CHF exacerbation. Reported having been without his medications prior to admission but was compliant with his medications following discharge. Weight was stable at 239 lbs and he was continued on Coreg 12.5mg  BID, Entresto 49-51mg  BID, Spironolactone 12.5mg  daily, ASA, Atorvastatin 80mg  daily and Lasix 20mg  daily.   By review of Care Everywhere, he was admitted to [facility] in 07/2019 for atrial fibrillation and required IV Amiodarone with transition to PO Amiodarone 400mg  BID for 6 days then 400mg  daily for 7 days then 200mg  daily. Was again admitted to [facility] on 09/06/2019 for acute hypoxic respiratory failure in the setting of a COPD and CHF exacerbation. Weight was at 266 lbs on admission. It appears he left AMA on 09/10/2019.  He presented to [facility] ED on 09/12/2019 for evaluation of worsening fluid retention. Reports worsening dyspnea on exertion, orthopnea and dyspnea on exertion. No recent chest pain or palpitations. Says his HR was in the 140's to 150's at times during his recent admission but he was  asymptomatic with this.   Initial labs showed WBC 12.7, Hgb 12.8, platelets 392, Na+ 130, K+ 4.0 and creatinine 4.10 (at 0.69 in 05/2019, at 1.82 on 09/09/2019 by review of Care Everywhere). Initial HS Troponin 18 with repeat of 17. BNP 2047. Mg 1.9. TSH 5.035. COVID negative. CXR showing cardiomegaly with mild vascular congestion. Renal US showed no significant abnormalities. EKG shows atrial fibrillation with possible flutter, HR 88 with PVC's and nonspecific IVCD.  Nephrology has been consulted and Entresto and Aldactone were held at the time of admission. He has been receiving IV Lasix 60mg  BID and repeat BMET this AM shows Na+ at 132 and creatinine has improved to 2.91. Recorded output of -350 mL thus far and weight at 273 lbs.    Past Medical History:  Diagnosis Date  . Anxiety   . Atrial fibrillation ([place])   . CAD (coronary artery disease)    Multivessel disease status post CABG 2015 - cardiac catheterization May 2020 without revascularization options  . Cardiomyopathy ([place])    a. EF 25-30% by echo in 06/2018 b. EF at 20% by repeat echo in 08/2019  . CHF (congestive heart failure) ([place])   . COPD (chronic obstructive pulmonary disease) ([place])   . Depression   . GERD (gastroesophageal reflux disease)   . Hyperlipidemia   . Hypertension   . Insomnia   . Myocardial infarction ([place]) 11/04/13  . Neuropathy   . Type 2 diabetes mellitus ([place])     Past Surgical History:  Procedure Laterality Date  . CARDIOVERSION N/A 11/06/2013  Procedure: CARDIOVERSION;  Surgeon: Thayer Headings, MD;  Location: [place];  Service: Cardiovascular;  Laterality: N/A;  . CARPAL TUNNEL RELEASE  04/2015  . COLONOSCOPY N/A 05/11/2014   Procedure: COLONOSCOPY;  Surgeon: Rogene Houston, MD;  [facility];  Service: Endoscopy;  Laterality: N/A;  930  . CORONARY ARTERY BYPASS GRAFT N/A 11/09/2013   Procedure: CORONARY ARTERY BYPASS GRAFTING (CABG);  Surgeon: Melrose Nakayama, MD;  Location: [place];   Service: Open Heart Surgery;  Laterality: N/A;  . INTRAOPERATIVE TRANSESOPHAGEAL ECHOCARDIOGRAM N/A 11/09/2013   Procedure: INTRAOPERATIVE TRANSESOPHAGEAL ECHOCARDIOGRAM;  Surgeon: Melrose Nakayama, MD;  Location: [place];  Service: Open Heart Surgery;  Laterality: N/A;  . LEFT HEART CATHETERIZATION WITH CORONARY ANGIOGRAM N/A 11/04/2013   Procedure: LEFT HEART CATHETERIZATION WITH CORONARY ANGIOGRAM;  Surgeon: Troy Sine, MD;  Location: [facility] CATH LAB;  Service: Cardiovascular;  Laterality: N/A;  . NO PAST SURGERIES    . RIGHT/LEFT HEART CATH AND CORONARY/GRAFT ANGIOGRAPHY N/A 07/14/2018   Procedure: RIGHT/LEFT HEART CATH AND CORONARY/GRAFT ANGIOGRAPHY;  Surgeon: Wellington Hampshire, MD;  Location: [place] CV LAB;  Service: Cardiovascular;  Laterality: N/A;     Home Medications:  Prior to Admission medications   Medication Sig Start Date End Date Taking? Authorizing Provider  albuterol (VENTOLIN HFA) 108 (90 Base) MCG/ACT inhaler Inhale 2 puffs into the lungs every 6 (six) hours as needed for wheezing or shortness of breath. 06/29/18  Yes Terald Sleeper, PA-C  aspirin 81 MG tablet Take 81 mg by mouth daily.   Yes [provider]  atorvastatin (LIPITOR) 80 MG tablet Take 1 tablet (80 mg total) by mouth daily at 6 PM. 03/22/19  Yes Branch, Alphonse Guild, MD  carvedilol (COREG) 6.25 MG tablet Take 2 tablets (12.5 mg total) by mouth 2 (two) times daily. 10/05/18  Yes Terald Sleeper, PA-C  citalopram (CELEXA) 20 MG tablet Take 1 tablet (20 mg total) by mouth daily. 10/05/18  Yes Particia Nearing S, PA-C  ELIQUIS 5 MG TABS tablet Take 5 mg by mouth 2 (two) times daily. 08/31/19  Yes [provider]  furosemide (LASIX) 20 MG tablet Take 1 tablet (20 mg total) by mouth daily. 07/21/19  Yes Branch, Alphonse Guild, MD  insulin aspart protamine- aspart (NOVOLOG MIX 70/30) (70-30) 100 UNIT/ML injection Inject 0.15-0.3 mLs (15-30 Units total) into the skin 3 (three) times daily. Patient taking differently:  Inject 20 Units into the skin 3 (three) times daily.  03/09/18  Yes Terald Sleeper, PA-C  metFORMIN (GLUCOPHAGE) 1000 MG tablet Take 1 tablet (1,000 mg total) by mouth 2 (two) times daily with a meal. 10/05/18  Yes Terald Sleeper, PA-C  metoprolol succinate (TOPROL-XL) 50 MG 24 hr tablet Take 50 mg by mouth daily. Take with or immediately following a meal.   Yes [provider]  pantoprazole (PROTONIX) 40 MG tablet Take 1 tablet (40 mg total) by mouth daily. 10/05/18  Yes Terald Sleeper, PA-C  rOPINIRole (REQUIP) 0.5 MG tablet Take 1-2 tablets (0.5-1 mg total) by mouth at bedtime. 10/05/18  Yes Terald Sleeper, PA-C  sacubitril-valsartan (ENTRESTO) 49-51 MG Take 1 tablet by mouth 2 (two) times daily. 03/22/19  Yes BranchAlphonse Guild, MD  spironolactone (ALDACTONE) 25 MG tablet Take 0.5 tablets (12.5 mg total) by mouth daily. 03/22/19 03/21/20 Yes BranchAlphonse Guild, MD  temazepam (RESTORIL) 15 MG capsule Take 15 mg by mouth at bedtime as needed for sleep.  08/31/19  Yes [provider]    Inpatient Medications: Scheduled Meds: . apixaban  5 mg Oral BID  . aspirin EC  81 mg Oral Daily  . atorvastatin  80 mg Oral q1800  . busPIRone  5 mg Oral BID  . carvedilol  12.5 mg Oral BID  . citalopram  20 mg Oral Daily  . clotrimazole   Topical BID  . furosemide  60 mg Intravenous Q12H  . insulin aspart  0-5 Units Subcutaneous QHS  . insulin aspart  0-9 Units Subcutaneous TID WC  . pantoprazole  40 mg Oral Daily  . rOPINIRole  0.5 mg Oral Q12H  . sodium chloride flush  3 mL Intravenous Q12H   Continuous Infusions: . sodium chloride     PRN Meds: sodium chloride, acetaminophen, albuterol, diphenhydrAMINE-zinc acetate, ondansetron (ZOFRAN) IV, sodium chloride flush  Allergies:   No Known Allergies  Social History:   Social History   Socioeconomic History  . Marital status: Married    Spouse name: Inez Catalina   . Number of children: 4  . Years of education: 61  . Highest education level:  Not on file  Occupational History  . Not on file  Tobacco Use  . Smoking status: Former Smoker    Packs/day: 0.50    Types: Cigarettes    Quit date: 07/16/2018    Years since quitting: 1.1  . Smokeless tobacco: Former Systems developer    Quit date: 11/04/2013  . Tobacco comment: reports quitting 07/16/2018  Vaping Use  . Vaping Use: Never used  Substance and Sexual Activity  . Alcohol use: No    Alcohol/week: 2.0 standard drinks    Types: 2 Cans of beer per week  . Drug use: No    Comment: cocaine over 3 years ago  . Sexual activity: Yes  Other Topics Concern  . Not on file  Social History Narrative   Lives with wife and 2 children   Caffeine use: none   Social Determinants of Health   Financial Resource Strain:   . Difficulty of Paying Living Expenses:   Food Insecurity:   . Worried About Charity fundraiser in the Last Year:   . Arboriculturist in the Last Year:   Transportation Needs:   . Film/video editor (Medical):   Marland Kitchen Lack of Transportation (Non-Medical):   Physical Activity:   . Days of Exercise per Week:   . Minutes of Exercise per Session:   Stress:   . Feeling of Stress :   Social Connections:   . Frequency of Communication with Friends and Family:   . Frequency of Social Gatherings with Friends and Family:   . Attends Religious Services:   . Active Member of Clubs or Organizations:   . Attends Archivist Meetings:   Marland Kitchen Marital Status:   Intimate Partner Violence:   . Fear of Current or Ex-Partner:   . Emotionally Abused:   Marland Kitchen Physically Abused:   . Sexually Abused:      Family History:    Family History  Problem Relation Age of Onset  . Hypertension Paternal Grandfather   . Hypertension Brother   . Diabetes Brother   . Neuropathy Neg Hx       Review of Systems    General:  No chills, fever, night sweats or weight changes.  Cardiovascular:  No chest pain, palpitations, paroxysmal nocturnal dyspnea. Positive for dyspnea on exertion, edema  and orthopnea.  Dermatological: No rash, lesions/masses Respiratory: No cough, Positive for dyspnea.  Urologic: No hematuria, dysuria Abdominal:   No nausea, vomiting, diarrhea, bright red blood per rectum, melena, or hematemesis Neurologic:  No visual changes, wkns, changes in mental status. All other systems reviewed and are otherwise negative except as noted above.  Physical Exam/Data    Vitals:   09/12/19 1710 09/12/19 2108 09/12/19 2123 09/13/19 0529  BP:  99/81 109/83 (!) 114/99  Pulse:  73 86 80  Resp:  20  20  Temp:  (!) 97.4 F (36.3 C)  97.6 F (36.4 C)  TempSrc:  Oral    SpO2: 97% 98%  97%  Weight:    (!) 124 kg  Height:        Intake/Output Summary (Last 24 hours) at 09/13/2019 1048 Last data filed at 09/13/2019 0500 Gross per 24 hour  Intake --  Output 350 ml  Net -350 ml   Filed Weights   09/12/19 0440 09/13/19 0529  Weight: (!) 120.7 kg (!) 124 kg   Body mass index is 40.37 kg/m.   General: Pleasant Caucasian male appearing in NAD Psych: Normal affect. Neuro: Alert and oriented X 3. Moves all extremities spontaneously. HEENT: Normal  Neck: Supple without bruits. JVD at 10 cm. Lungs:  Resp regular and unlabored, decreased breath sounds along bases bilaterally. Heart: Irregularly Irregular. no s3, s4, or murmurs. Abdomen: Soft, non-tender, non-distended, BS + x 4.  Extremities: No clubbing or cyanosis. 2+ pitting edema with ulcer along right foot. DP/PT/Radials 2+ and equal bilaterally.   EKG:  The EKG was personally reviewed and demonstrates: Atrial fibrillation with possible flutter, HR 88 with PVC's and nonspecific IVCD.  Telemetry:  Telemetry was personally reviewed and demonstrates:  Atrial flutter, HR in 70's to 80's. Occasional PVC's.    Labs/Studies     Relevant CV Studies:  Cardiac Catheterization: 06/2018  Ost Ramus to Ramus lesion is 60% stenosed.  Ost Cx to Prox Cx lesion is 90% stenosed.  Mid Cx lesion is 80% stenosed.  Ost 1st  Mrg lesion is 100% stenosed.  LIMA and is normal in caliber.  Prox RCA to Mid RCA lesion is 30% stenosed.  SVG.  Origin to Prox Graft lesion is 100% stenosed.  SVG.  Origin to Prox Graft lesion is 100% stenosed.  SVG.  Origin lesion is 100% stenosed.  Prox LAD lesion is 70% stenosed.  Ost LAD to Prox LAD lesion is 85% stenosed.  Dist RCA lesion is 100% stenosed.   1.  Severe underlying three-vessel coronary artery disease with occluded vein grafts (SVG to OM, SVG to diagonal/ramus and SVG to right PDA/PL).  The only patent graft is LIMA to LAD.  The native coronary arteries are heavily calcified and diffusely diseased. 2.  Left ventricular angiography was not performed.  EF was severely reduced by echo at 20%. 3.  Right heart catheterization showed moderately elevated filling pressures, moderate pulmonary hypertension and normal cardiac output.  Recommendations: Unfortunately, all vein grafts are occluded.  The RCA is occluded distally with heavy calcifications.  The left circumflex is heavily calcified and diffusely diseased.  Both of these are not suitable for PCI.  Also the distal branches are relatively small and likely not good targets for redo CABG.  His best option is aggressive medical therapy. Regarding heart failure, he continues to be moderately volume overloaded with a wedge pressure of 29 mmHg.  I resumed IV furosemide.  I added losartan.  Consider transitioning to [facility] and adding spironolactone.   Echocardiogram: 08/2019 IMPRESSIONS    1. Left  ventricular ejection fraction, by estimation, is approximately  20%. The left ventricle has severely decreased function. The left  ventricle demonstrates global hypokinesis with some regional variation.  The left ventricular internal cavity size  was mildly dilated. There is mild left ventricular hypertrophy. Left  ventricular diastolic parameters are indeterminate in the setting of  possible atrial flutter.  2.  No LV mural thrombus with Definity contrast.  3. Right ventricular systolic function is severely reduced. The right  ventricular size is mildly enlarged. There is mildly elevated pulmonary  artery systolic pressure. The estimated right ventricular systolic  pressure is 75.6 mmHg.  4. Left atrial size was severely dilated.  5. Right atrial size was moderately dilated.  6. The mitral valve is grossly normal. Mild mitral valve regurgitation.  7. The aortic valve is tricuspid. Aortic valve regurgitation is not  visualized.  8. The inferior vena cava is dilated in size with <50% respiratory  variability, suggesting right atrial pressure of 15 mmHg.    Laboratory Data:  Chemistry Recent Labs  Lab October 06, 2019 0512 09/13/19 0558  NA 130* 132*  K 4.0 3.5  CL 94* 97*  CO2 18* 23  GLUCOSE 180* 219*  BUN 90* 90*  CREATININE 4.10* 2.91*  CALCIUM 8.1* 7.6*  GFRNONAA 15* 23*  GFRAA 18* 27*  ANIONGAP 18* 12    No results for input(s): PROT, ALBUMIN, AST, ALT, ALKPHOS, BILITOT in the last 168 hours. Hematology Recent Labs  Lab 10-06-19 0512  WBC 12.7*  RBC 5.01  HGB 12.8*  HCT 40.0  MCV 79.8*  MCH 25.5*  MCHC 32.0  RDW 15.5  PLT 392   Cardiac EnzymesNo results for input(s): TROPONINI in the last 168 hours. No results for input(s): TROPIPOC in the last 168 hours.  BNP Recent Labs  Lab Oct 06, 2019 0512  BNP 2,047.0*    DDimer No results for input(s): DDIMER in the last 168 hours.  Radiology/Studies:  US RENAL  Result Date: 10-06-2019 CLINICAL DATA:  Acute renal failure. EXAM: RENAL / URINARY TRACT ULTRASOUND COMPLETE COMPARISON:  None. FINDINGS: Right Kidney: Renal measurements: 12.0 x 7.0 x 6.8 cm = volume: 300 mL . Echogenicity within normal limits. No mass or hydronephrosis visualized. Left Kidney: Renal measurements: 12.9 x 6.9 x 6.4 cm = volume: 296 mL. Echogenicity within normal limits. No mass or hydronephrosis visualized. Bladder: Appears normal for degree of bladder  distention. Ureteral jets are not visualized. Other: None. IMPRESSION: No significant renal abnormality is noted. Electronically Signed   By: Marijo Conception M.D.   On: 10/06/2019 08:34   DG Chest Port 1 View  Result Date: 10/06/19 CLINICAL DATA:  Shortness of breath and increased fluid EXAM: PORTABLE CHEST 1 VIEW COMPARISON:  06/16/2019 FINDINGS: Cardiomegaly. Prior CABG. Vascular congestion without Kerley lines or effusion. No consolidation or pneumothorax. IMPRESSION: Chronic cardiomegaly with mild vascular congestion. Electronically Signed   By: Monte Fantasia M.D.   On: 2019/10/06 05:53   ECHOCARDIOGRAM COMPLETE  Result Date: 10-06-19    ECHOCARDIOGRAM REPORT   Patient Name:   WACEY ZIEGER Date of Exam: 10/06/2019 Medical Rec #:  [id number]      Height:       69.0 in Accession #:    [id number]     Weight:       266.0 lb Date of Birth:  06-27-1965       BSA:          2.332 m Patient Age:    39 years  BP:           117/95 mmHg Patient Gender: M              HR:           82 bpm. Exam Location:  [facility] Procedure: 2D Echo, Cardiac Doppler and Color Doppler Indications:    CHF-Acute Systolic 244.01 / U27.25  History:        Patient has prior history of Echocardiogram examinations, most                 recent 07/13/2019. CHF, CAD and Previous Myocardial Infarction,                 Prior CABG, Arrythmias:Atrial Fibrillation; Risk                 Factors:Hypertension, Diabetes, Dyslipidemia and Current Smoker.                 OSA (obstructive sleep apnea), h/o Cocaine abuse, Obesity.  Sonographer:    Alvino Chapel RCS Referring Phys: [place]  1. Left ventricular ejection fraction, by estimation, is approximately 20%. The left ventricle has severely decreased function. The left ventricle demonstrates global hypokinesis with some regional variation. The left ventricular internal cavity size was mildly dilated. There is mild left ventricular hypertrophy. Left ventricular  diastolic parameters are indeterminate in the setting of possible atrial flutter.  2. No LV mural thrombus with Definity contrast.  3. Right ventricular systolic function is severely reduced. The right ventricular size is mildly enlarged. There is mildly elevated pulmonary artery systolic pressure. The estimated right ventricular systolic pressure is 36.6 mmHg.  4. Left atrial size was severely dilated.  5. Right atrial size was moderately dilated.  6. The mitral valve is grossly normal. Mild mitral valve regurgitation.  7. The aortic valve is tricuspid. Aortic valve regurgitation is not visualized.  8. The inferior vena cava is dilated in size with <50% respiratory variability, suggesting right atrial pressure of 15 mmHg. FINDINGS  Left Ventricle: Left ventricular ejection fraction, by estimation, is 20%. The left ventricle has severely decreased function. The left ventricle demonstrates global hypokinesis. Definity contrast agent was given IV to delineate the left ventricular endocardial borders. The left ventricular internal cavity size was mildly dilated. There is mild left ventricular hypertrophy. Left ventricular diastolic parameters are indeterminate. Right Ventricle: The right ventricular size is mildly enlarged. No increase in right ventricular wall thickness. Right ventricular systolic function is severely reduced. There is mildly elevated pulmonary artery systolic pressure. The tricuspid regurgitant velocity is 2.39 m/s, and with an assumed right atrial pressure of 15 mmHg, the estimated right ventricular systolic pressure is 44.0 mmHg. Left Atrium: Left atrial size was severely dilated. Right Atrium: Right atrial size was moderately dilated. Pericardium: There is no evidence of pericardial effusion. Mitral Valve: The mitral valve is grossly normal. There is mild thickening of the mitral valve leaflet(s). Mild mitral valve regurgitation. Tricuspid Valve: The tricuspid valve is grossly normal. Tricuspid  valve regurgitation is mild. Aortic Valve: The aortic valve is tricuspid. Aortic valve regurgitation is not visualized. Mild to moderate aortic valve annular calcification. Pulmonic Valve: The pulmonic valve was grossly normal. Pulmonic valve regurgitation is trivial. Aorta: The aortic root is normal in size and structure. Venous: The inferior vena cava is dilated in size with less than 50% respiratory variability, suggesting right atrial pressure of 15 mmHg. IAS/Shunts: No atrial level shunt detected by color flow Doppler.  LEFT VENTRICLE  PLAX 2D LVIDd:         6.04 cm LVIDs:         5.43 cm LV PW:         1.08 cm LV IVS:        1.23 cm LVOT diam:     2.00 cm LV SV:         32 LV SV Index:   14 LVOT Area:     3.14 cm  LV Volumes (MOD) LV vol d, MOD A2C: 158.0 ml LV vol d, MOD A4C: 170.0 ml LV vol s, MOD A2C: 114.0 ml LV vol s, MOD A4C: 132.0 ml LV SV MOD A2C:     44.0 ml LV SV MOD A4C:     170.0 ml LV SV MOD BP:      42.2 ml LEFT ATRIUM              Index       RIGHT ATRIUM           Index LA diam:        5.50 cm  2.36 cm/m  RA Area:     26.50 cm LA Vol (A2C):   117.0 ml 50.17 ml/m RA Volume:   96.50 ml  41.38 ml/m LA Vol (A4C):   119.0 ml 51.02 ml/m LA Biplane Vol: 125.0 ml 53.60 ml/m  AORTIC VALVE LVOT Vmax:   61.50 cm/s LVOT Vmean:  41.800 cm/s LVOT VTI:    0.102 m  AORTA Ao Root diam: 3.60 cm MITRAL VALVE               TRICUSPID VALVE MV Area (PHT): 6.71 cm    TR Peak grad:   22.8 mmHg MV Decel Time: 113 msec    TR Vmax:        239.00 cm/s MV E velocity: 78.30 cm/s                            SHUNTS                            Systemic VTI:  0.10 m                            Systemic Diam: 2.00 cm Rozann Lesches MD Electronically signed by Rozann Lesches MD Signature Date/Time: 09/12/2019/2:10:13 PM    Final      Assessment & Plan    1. Chronic Systolic CHF/Ischemic Cardiomyopathy - EF was previously 25-30% by echo in 06/2018. Repeat echo this admission shows his EF is further reduced at 20% with  global HK and severely reduced RV function.  - BNP elevated to 2047 on admission and CXR consistent with CHF. Would defer diuretic management to Nephrology given his AKI.  - Continue Coreg 12.5mg  BID. Entresto and Spironolactone currently held. Pending follow-up of his renal function this admission, may need to consider the use of Hydralazine and Nitrates in place of this if BP allows. Prior notes from Dr. Harl Bowie mentioned possible ICD placement following a repeat echocardiogram. If compliant with his medication regimen in the outpatient setting, would again address.   2. Persistent Atrial Flutter - He was started on IV Amiodarone which was transitioned to PO Amiodarone during his admission in 07/2019 at [facility]. Given his severe LA dilation by echocardiogram this admission, a rate-control strategy will likely need  to be pursued. HR currently well-controlled in the 70's to 80's on Coreg 12.5mg  BID. Will review with Dr. Domenic Polite in regards to restarting Amiodarone. Will need to follow thyroid function as outlined below.  - He was on Eliquis 5mg  BID prior to admission. Would recommend restarting. Only indication for reduced dosing at this time is renal function.   3. CAD - He is s/p CABG in 10/2013 with cath in 06/2018 showing occluded SVG-OM, SVG-RI and SVG-PDA with patent LIMA-LAD and no targets for PCI or re-do CABG.  - He reports baseline dyspnea on exertion but denies any recent chest pain. HS Troponin values have been flat at 18 and 17 this admission. No indication for further ischemic testing at this time. Not a candidate for a repeat cath given the high-risk for contrast induced nephropathy. - Continue ASA, statin and BB therapy.   4. HTN - BP has been low-normal at 99/81 - 114/99 within the past 24 hours. He remains on Coreg 12.5mg  BID. No longer on Entresto or Spironolactone given his CKD.   5. 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 Previously 0.69 in 05/2019,  at 1.82 on 09/09/2019 by review of Care Everywhere. Creatinine improved to 2.91 this AM. Nephrology following.   6. Elevated TSH - TSH 5.035 this admission. Will check Free T4. Previously on Amiodarone which is now held.   For questions or updates, please contact [place] Please consult [email or web address] for contact info under Cardiology/STEMI.  Signed, Erma Heritage, PA-C 09/13/2019, 10:48 AM Pager: [phone]   Attending note:  Patient seen and examined. I reviewed extensive records and discussed case with Ms. Ahmed Prima PA-C, I agree with her above findings. Shane Frye is a medically complex 54 year old male with multivessel CAD status post CABG with known graft disease and limited revascularization options based on cardiac catheterization in May 2020. He has a mixed etiology cardiomyopathy with LVEF recently assessed at 20%, also severe RV dysfunction. Additional problems include atrial fibrillation/flutter, type 2 diabetes mellitus, CKD stage II-III, and previous substance abuse.  He presents now with acute on chronic combined heart failure in association with acute on chronic renal failure and with persistent atrial flutter. He has been recently hospitalized at [facility], left AMA. At present he is being followed by Nephrology, taken off Entresto and Aldactone with some improvement in renal function. High-sensitivity troponin I levels do not suggest ACS.  On examination he is afebrile, heart rate in the 80s and atrial flutter by telemetry which I personally reviewed, systolic 500. Lungs exhibit decreased breath sounds at the bases. Cardiac exam with indistinct PMI and irregularly irregular rhythm without obvious gallop.  Pertinent lab work includes potassium 3.5, BUN 90, creatinine 2.91 down from 4.1, TSH 5.035, high-sensitivity troponin I 18 and 17, BNP 2047, hemoglobin 12.8.  Chest x-ray reports cardiomegaly with mild vascular congestion. Echocardiogram reveals LVEF  approximately 20% with global hypokinesis and regional variation, severe RV dysfunction with mildly elevated PASP, severe left atrial enlargement. Reviewed his ECG from July 26 which shows probable atrial flutter with variable conduction, diffuse repolarization abnormalities, IVCD.  At this point agree with stopping Entresto and Aldactone. Although he may have some component of low output, would continue present dose of Coreg mainly for heart rate control of atrial flutter which looks to be adequate at this time. Resume Eliquis for stroke prophylaxis. Do not resume amiodarone at this point. Continue IV Lasix (currently dosed by Nephrology). May eventually be able to add combination of  hydralazine and nitrates depending on blood pressure. Per his work-up last year, he does not have good revascularization targets and is at high risk for contrast nephropathy at this time anyway.  Satira Sark, M.D., F.A.C.C.

## 2019-09-13 NOTE — Progress Notes (Signed)
PROGRESS NOTE    Shane Frye  WNU:[id number] DOB: [birth year] DOA: 09/12/2019 PCP: Caryl Bis, MD   Chief Complaint  Patient presents with  . Shortness of Breath    Brief Narrative:  Shane Frye is a 54 y.o. male with medical history significant of COPD, depression/anxiety, type 2 diabetes mellitus with nephropathy, gastroesophageal reflux disease, hypertension, hyperlipidemia, history of coronary artery disease and systolic heart failure; who presented to the hospital secondary to worsening shortness of breath (at rest and on exertion), and increased swelling.  Patient reports couple recent admissions to [facility] for similar symptoms and despite intervention while hospitalized no significant improvement of his symptoms after discharge.  Patient reports to be taking his medications as prescribed, but expressed having some diet indiscretion.  He has noticed an increase in over 20 pounds of weight, lower extremity swelling significantly with blister development from fluid overload and also positive orthopnea.    Patient denies fever, chills, cough, dysuria, hematuria, melena, hematochezia, abdominal pain, chest pain, headaches, focal weakness, sick contacts or any other complaints.  ED Course: Chest x-ray demonstrating vascular congestion along with cardiomegaly, BMP over 2000, on examination fluid overload has been appreciated and his blood work is demonstrating mild hyponatremia, metabolic acidosis and significant worsening of renal function with a creatinine up to 4.10.  TRH has been consulted to admit patient for further evaluation and management.   Assessment & Plan: 1-Acute on chronic systolic CHF (congestive heart failure) (HCC) -Still with signs of fluid overload on examination and complaining of shortness of breath with exertion and orthopnea. -Renal function has improved after holding nephrotoxic agents. -Continue IV diuresis -Follow further recommendations  by cardiology service. -Continue Coreg at current dose. -Daily weight, low-sodium diet, strict I's and O's and limited fluid intake, once again encouraged.  2-acute on chronic renal failure/hyponatremia metabolic acidosis -Stage II-IIIa (transitioning stage of baseline). -Appears to be secondary to prerenal azotemia and continue use of nephrotoxic agents. -Continue IV Lasix x1-continue holding Entresto and Aldactone. -Avoid hypotension. -Follow nephrology service recommendations. -Creatinine down to 2.9; peaked at 4.10 on presentation -sodium level up to 132 currently.  3-HLD (hyperlipidemia) -Continue statins.  4-morbid obesity -Body mass index is 40.37 kg/m.  -Low calorie diet, portion control increase physical activity discussed with patient.  5-gastroesophageal reflux disease -Continue PPI  6-type 2 diabetes with nephropathy -Continue holding oral hypoglycemic agents -Continue sliding scale insulin and follow CBGs -A1c 9.2  7-hx of atrial flutter -Continue beta-blocker for rate control -Patient started on Eliquis. -stable currently  8-chronic resp failure with hypoxia due to COPD -No wheezing: -Continue bronchodilator management and follow breathing status. -Per family reports he uses oxygen at home intermittently; will follow O2 sats and resume the use of 2 L nasal cannula as needed.  9-HLD -continue statins  10-RLS -continue requip.   DVT prophylaxis: eliquis. Code Status: Full code Family Communication: No family at bedside; some [facility] reach over the phone at [phone] and updates provided. Disposition:   Status is: Inpatient  Dispo: The patient is from: Home              Anticipated d/c is to: Home              Anticipated d/c date is: To be determined.              Patient currently no medically ready for discharge; still complaining of shortness of breath with exertion and having orthopnea.  Patient renal function has improved  but not back to  baseline and is still with signs of fluid overload on examination.  Continue IV diuresis.       Consultants:   Cardiology service  Nephrology service   Procedures:  See below for x-ray reports.   2D echo: Pending   Antimicrobials:  None   Subjective: No chest pain, no nausea, no vomiting, no palpitation.  Patient is afebrile.  Complaining of right foot pain/burning sensation in the dorsal ulcerated blister; still having shortness of breath on exertion and experiencing orthopnea.  Objective: Vitals:   09/12/19 1710 09/12/19 2108 09/12/19 2123 09/13/19 0529  BP:  99/81 109/83 (!) 114/99  Pulse:  73 86 80  Resp:  20  20  Temp:  (!) 97.4 F (36.3 C)  97.6 F (36.4 C)  TempSrc:  Oral    SpO2: 97% 98%  97%  Weight:    (!) 124 kg  Height:        Intake/Output Summary (Last 24 hours) at 09/13/2019 1135 Last data filed at 09/13/2019 0500 Gross per 24 hour  Intake --  Output 350 ml  Net -350 ml   Filed Weights   09/12/19 0440 09/13/19 0529  Weight: (!) 120.7 kg (!) 124 kg    Examination:  General exam: Afebrile, in no acute distress; reports no chest pain, no palpitations, no nausea, no vomiting.  Expressed breathing is a slightly better and is not requiring oxygen supplementation at this time.  Still experiencing shortness of breath with exertion and expressing some orthopnea. Respiratory system: No wheezing, positive scattered rhonchi; fine crackles at the bases. Cardiovascular system: Rate controlled, no rubs, no gallops, difficult to assess JVD with body habitus but appears to be present. Gastrointestinal system: Abdomen is obese, nondistended, soft and nontender. No organomegaly or masses felt. Normal bowel sounds heard. Central nervous system: Alert and oriented. No focal neurological deficits. Extremities: No cyanosis or clubbing; 2+ edema appreciated bilaterally, lower extremity with multiple scabs lesions and on his right foot positive ulcerated blister in the  dorsal aspect; mild erythematous changes appreciated.  Expressed pain on palpation. Skin: No petechiae. Psychiatry: Judgement and insight appear normal. Mood & affect appropriate.    Data Reviewed: I have personally reviewed following labs and imaging studies  CBC: Recent Labs  Lab 09/12/19 0512  WBC 12.7*  NEUTROABS 8.6*  HGB 12.8*  HCT 40.0  MCV 79.8*  PLT 419    Basic Metabolic Panel: Recent Labs  Lab 09/12/19 0512 09/12/19 0729 09/13/19 0558  NA 130*  --  132*  K 4.0  --  3.5  CL 94*  --  97*  CO2 18*  --  23  GLUCOSE 180*  --  219*  BUN 90*  --  90*  CREATININE 4.10*  --  2.91*  CALCIUM 8.1*  --  7.6*  MG  --  1.9  --     GFR: Estimated Creatinine Clearance: 36.9 mL/min (A) (by C-G formula based on SCr of 2.91 mg/dL (H)).  CBG: Recent Labs  Lab 09/12/19 1104 09/12/19 1632 09/12/19 2108 09/13/19 0756 09/13/19 1131  GLUCAP 190* 125* 169* 198* 202*     Recent Results (from the past 240 hour(s))  SARS Coronavirus 2 by RT PCR (hospital order, performed in [facility] hospital lab) Nasopharyngeal Nasopharyngeal Swab     Status: None   Collection Time: 09/12/19  8:07 AM   Specimen: Nasopharyngeal Swab  Result Value Ref Range Status   SARS Coronavirus 2 NEGATIVE NEGATIVE Final  Comment: (NOTE) SARS-CoV-2 target nucleic acids are NOT DETECTED.  The SARS-CoV-2 RNA is generally detectable in upper and lower respiratory specimens during the acute phase of infection. The lowest concentration of SARS-CoV-2 viral copies this assay can detect is 250 copies / mL. A negative result does not preclude SARS-CoV-2 infection and should not be used as the sole basis for treatment or other patient management decisions.  A negative result may occur with improper specimen collection / handling, submission of specimen other than nasopharyngeal swab, presence of viral mutation(s) within the areas targeted by this assay, and inadequate number of viral copies (<250 copies  / mL). A negative result must be combined with clinical observations, patient history, and epidemiological information.  Fact Sheet for Patients:   StrictlyIdeas.no  Fact Sheet for Healthcare Providers: BankingDealers.co.za  This test is not yet approved or  cleared by the [place] FDA and has been authorized for detection and/or diagnosis of SARS-CoV-2 by FDA under an Emergency Use Authorization (EUA).  This EUA will remain in effect (meaning this test can be used) for the duration of the COVID-19 declaration under Section 564(b)(1) of the Act, 21 U.S.C. section 360bbb-3(b)(1), unless the authorization is terminated or revoked sooner.  Performed at [facility], [address], [place], [place] [address]      Radiology Studies: US RENAL  Result Date: 09/12/2019 CLINICAL DATA:  Acute renal failure. EXAM: RENAL / URINARY TRACT ULTRASOUND COMPLETE COMPARISON:  None. FINDINGS: Right Kidney: Renal measurements: 12.0 x 7.0 x 6.8 cm = volume: 300 mL . Echogenicity within normal limits. No mass or hydronephrosis visualized. Left Kidney: Renal measurements: 12.9 x 6.9 x 6.4 cm = volume: 296 mL. Echogenicity within normal limits. No mass or hydronephrosis visualized. Bladder: Appears normal for degree of bladder distention. Ureteral jets are not visualized. Other: None. IMPRESSION: No significant renal abnormality is noted. Electronically Signed   By: Marijo Conception M.D.   On: 09/12/2019 08:34   DG Chest Port 1 View  Result Date: 09/12/2019 CLINICAL DATA:  Shortness of breath and increased fluid EXAM: PORTABLE CHEST 1 VIEW COMPARISON:  06/16/2019 FINDINGS: Cardiomegaly. Prior CABG. Vascular congestion without Kerley lines or effusion. No consolidation or pneumothorax. IMPRESSION: Chronic cardiomegaly with mild vascular congestion. Electronically Signed   By: Monte Fantasia M.D.   On: 09/12/2019 05:53   ECHOCARDIOGRAM COMPLETE  Result Date:  09/12/2019    ECHOCARDIOGRAM REPORT   Patient Name:   ABDULKARIM EBERLIN Date of Exam: 09/12/2019 Medical Rec #:  [id number]      Height:       69.0 in Accession #:    [id number]     Weight:       266.0 lb Date of Birth:  Mar 04, 1965       BSA:          2.332 m Patient Age:    14 years       BP:           117/95 mmHg Patient Gender: M              HR:           82 bpm. Exam Location:  [facility] Procedure: 2D Echo, Cardiac Doppler and Color Doppler Indications:    CHF-Acute Systolic 213.08 / M57.84  History:        Patient has prior history of Echocardiogram examinations, most                 recent 07/13/2019. CHF,  CAD and Previous Myocardial Infarction,                 Prior CABG, Arrythmias:Atrial Fibrillation; Risk                 Factors:Hypertension, Diabetes, Dyslipidemia and Current Smoker.                 OSA (obstructive sleep apnea), h/o Cocaine abuse, Obesity.  Sonographer:    Alvino Chapel RCS Referring Phys: [place]  1. Left ventricular ejection fraction, by estimation, is approximately 20%. The left ventricle has severely decreased function. The left ventricle demonstrates global hypokinesis with some regional variation. The left ventricular internal cavity size was mildly dilated. There is mild left ventricular hypertrophy. Left ventricular diastolic parameters are indeterminate in the setting of possible atrial flutter.  2. No LV mural thrombus with Definity contrast.  3. Right ventricular systolic function is severely reduced. The right ventricular size is mildly enlarged. There is mildly elevated pulmonary artery systolic pressure. The estimated right ventricular systolic pressure is 78.4 mmHg.  4. Left atrial size was severely dilated.  5. Right atrial size was moderately dilated.  6. The mitral valve is grossly normal. Mild mitral valve regurgitation.  7. The aortic valve is tricuspid. Aortic valve regurgitation is not visualized.  8. The inferior vena cava is dilated in size with  <50% respiratory variability, suggesting right atrial pressure of 15 mmHg. FINDINGS  Left Ventricle: Left ventricular ejection fraction, by estimation, is 20%. The left ventricle has severely decreased function. The left ventricle demonstrates global hypokinesis. Definity contrast agent was given IV to delineate the left ventricular endocardial borders. The left ventricular internal cavity size was mildly dilated. There is mild left ventricular hypertrophy. Left ventricular diastolic parameters are indeterminate. Right Ventricle: The right ventricular size is mildly enlarged. No increase in right ventricular wall thickness. Right ventricular systolic function is severely reduced. There is mildly elevated pulmonary artery systolic pressure. The tricuspid regurgitant velocity is 2.39 m/s, and with an assumed right atrial pressure of 15 mmHg, the estimated right ventricular systolic pressure is 69.6 mmHg. Left Atrium: Left atrial size was severely dilated. Right Atrium: Right atrial size was moderately dilated. Pericardium: There is no evidence of pericardial effusion. Mitral Valve: The mitral valve is grossly normal. There is mild thickening of the mitral valve leaflet(s). Mild mitral valve regurgitation. Tricuspid Valve: The tricuspid valve is grossly normal. Tricuspid valve regurgitation is mild. Aortic Valve: The aortic valve is tricuspid. Aortic valve regurgitation is not visualized. Mild to moderate aortic valve annular calcification. Pulmonic Valve: The pulmonic valve was grossly normal. Pulmonic valve regurgitation is trivial. Aorta: The aortic root is normal in size and structure. Venous: The inferior vena cava is dilated in size with less than 50% respiratory variability, suggesting right atrial pressure of 15 mmHg. IAS/Shunts: No atrial level shunt detected by color flow Doppler.  LEFT VENTRICLE PLAX 2D LVIDd:         6.04 cm LVIDs:         5.43 cm LV PW:         1.08 cm LV IVS:        1.23 cm LVOT diam:      2.00 cm LV SV:         32 LV SV Index:   14 LVOT Area:     3.14 cm  LV Volumes (MOD) LV vol d, MOD A2C: 158.0 ml LV vol d, MOD A4C: 170.0 ml LV vol  s, MOD A2C: 114.0 ml LV vol s, MOD A4C: 132.0 ml LV SV MOD A2C:     44.0 ml LV SV MOD A4C:     170.0 ml LV SV MOD BP:      42.2 ml LEFT ATRIUM              Index       RIGHT ATRIUM           Index LA diam:        5.50 cm  2.36 cm/m  RA Area:     26.50 cm LA Vol (A2C):   117.0 ml 50.17 ml/m RA Volume:   96.50 ml  41.38 ml/m LA Vol (A4C):   119.0 ml 51.02 ml/m LA Biplane Vol: 125.0 ml 53.60 ml/m  AORTIC VALVE LVOT Vmax:   61.50 cm/s LVOT Vmean:  41.800 cm/s LVOT VTI:    0.102 m  AORTA Ao Root diam: 3.60 cm MITRAL VALVE               TRICUSPID VALVE MV Area (PHT): 6.71 cm    TR Peak grad:   22.8 mmHg MV Decel Time: 113 msec    TR Vmax:        239.00 cm/s MV E velocity: 78.30 cm/s                            SHUNTS                            Systemic VTI:  0.10 m                            Systemic Diam: 2.00 cm Rozann Lesches MD Electronically signed by Rozann Lesches MD Signature Date/Time: 09/12/2019/2:10:13 PM    Final     Scheduled Meds: . apixaban  5 mg Oral BID  . aspirin EC  81 mg Oral Daily  . atorvastatin  80 mg Oral q1800  . busPIRone  5 mg Oral BID  . carvedilol  12.5 mg Oral BID  . citalopram  20 mg Oral Daily  . clotrimazole   Topical BID  . furosemide  60 mg Intravenous Q12H  . insulin aspart  0-5 Units Subcutaneous QHS  . insulin aspart  0-9 Units Subcutaneous TID WC  . pantoprazole  40 mg Oral Daily  . rOPINIRole  0.5 mg Oral Q12H  . sodium chloride flush  3 mL Intravenous Q12H   Continuous Infusions: . sodium chloride       LOS: 1 day    Time spent: 35 minutes.    Barton Dubois, MD [facility]   To contact the attending provider between 7A-7P or the covering provider during after hours 7P-7A, please log into the web site [email or web address] and access using universal [place] password for that web site. If you do  not have the password, please call the hospital operator.  09/13/2019, 11:35 AM

## 2019-09-13 NOTE — Progress Notes (Signed)
[facility] ROUNDING NOTE   Subjective:   Brief history: 54 year old gentleman multiple medical problems including COPD diabetes mellitus coronary disease with systolic heart failure and ejection fraction 25%.  Tobacco abuse substance abuse and homeless.  Recently admitted to [facility] where he left [facility] 09/09/2019.  Baseline serum creatinine 05/2019 1.27.  Had acute kidney injury at [facility] with a peak creatinine of 2.46 mg/dL.  He was admitted 09/12/2019 with increasing shortness of breath and 20 pound weight gain with anasarca.  Patient was taking Entresto and Aldactone as an outpatient.  Renal ultrasound showed no significant renal abnormality.  Urinalysis was negative for red blood cells or white blood cells.  Urine protein was 30 mg/dL.  Appears to have some urine output this morning.  Diuresing with IV Lasix.  Appears comfortable with no complaints  Blood pressure 114/99 temperature 97.6.  Urine output 350 cc 09/13/2019  Sodium 132 potassium 3.5 chloride 97 CO2 23 BUN 90 creatinine 2.91 glucose 219 calcium 7.6.  Hemoglobin 12.8  Medications aspirin 81 mg day Lipitor 80 mg daily BuSpar 5 mg twice daily carvedilol 12.5 mg twice daily Celexa 20 mg daily, Lasix 60 mg every 12 hours, insulin sliding scale, Protonix 40 mg daily Requip 0.5 mg every 12 hours.     Objective:  Vital signs in last 24 hours:  Temp:  [97.4 F (36.3 C)-97.6 F (36.4 C)] 97.6 F (36.4 C) (07/27 0529) Pulse Rate:  [73-86] 80 (07/27 0529) Resp:  [20] 20 (07/27 0529) BP: (99-114)/(81-99) 114/99 (07/27 0529) SpO2:  [97 %-99 %] 97 % (07/27 0529) Weight:  [124 kg] 124 kg (07/27 0529)  Weight change: 3.343 kg Filed Weights   09/12/19 0440 09/13/19 0529  Weight: (!) 120.7 kg (!) 124 kg    Intake/Output: I/O last 3 completed shifts: In: -  Out: 350 [Urine:350]   Intake/Output this shift:  No intake/output data recorded.   HEENT: PERRLA, EOMI, mucous membranes moist  Neck:positive for  JVD Heart: Regular rate and rhythm faint systolic murmur Lungs:  Clear to auscultation bilaterally diminished breath sounds at bases Abdomen: obese, soft non tender Extremities:  2+ pitting edema Skin: multiple abrasions  Neuro: alert,  slightly scattered    Basic Metabolic Panel: Recent Labs  Lab 09/12/19 0512 09/12/19 0729 09/13/19 0558  NA 130*  --  132*  K 4.0  --  3.5  CL 94*  --  97*  CO2 18*  --  23  GLUCOSE 180*  --  219*  BUN 90*  --  90*  CREATININE 4.10*  --  2.91*  CALCIUM 8.1*  --  7.6*  MG  --  1.9  --     Liver Function Tests: No results for input(s): AST, ALT, ALKPHOS, BILITOT, PROT, ALBUMIN in the last 168 hours. No results for input(s): LIPASE, AMYLASE in the last 168 hours. No results for input(s): AMMONIA in the last 168 hours.  CBC: Recent Labs  Lab 09/12/19 0512  WBC 12.7*  NEUTROABS 8.6*  HGB 12.8*  HCT 40.0  MCV 79.8*  PLT 392    Cardiac Enzymes: No results for input(s): CKTOTAL, CKMB, CKMBINDEX, TROPONINI in the last 168 hours.  BNP: Invalid input(s): POCBNP  CBG: Recent Labs  Lab 09/12/19 0759 09/12/19 1104 09/12/19 1632 09/12/19 2108 09/13/19 0756  GLUCAP 182* 190* 125* 169* 198*    Microbiology: Results for orders placed or performed during the hospital encounter of 09/12/19  SARS Coronavirus 2 by RT PCR (hospital order, performed in  [facility] hospital lab) Nasopharyngeal Nasopharyngeal Swab     Status: None   Collection Time: 09/12/19  8:07 AM   Specimen: Nasopharyngeal Swab  Result Value Ref Range Status   SARS Coronavirus 2 NEGATIVE NEGATIVE Final    Comment: (NOTE) SARS-CoV-2 target nucleic acids are NOT DETECTED.  The SARS-CoV-2 RNA is generally detectable in upper and lower respiratory specimens during the acute phase of infection. The lowest concentration of SARS-CoV-2 viral copies this assay can detect is 250 copies / mL. A negative result does not preclude SARS-CoV-2 infection and should not be used as  the sole basis for treatment or other patient management decisions.  A negative result may occur with improper specimen collection / handling, submission of specimen other than nasopharyngeal swab, presence of viral mutation(s) within the areas targeted by this assay, and inadequate number of viral copies (<250 copies / mL). A negative result must be combined with clinical observations, patient history, and epidemiological information.  Fact Sheet for Patients:   StrictlyIdeas.no  Fact Sheet for Healthcare Providers: BankingDealers.co.za  This test is not yet approved or  cleared by the [place] FDA and has been authorized for detection and/or diagnosis of SARS-CoV-2 by FDA under an Emergency Use Authorization (EUA).  This EUA will remain in effect (meaning this test can be used) for the duration of the COVID-19 declaration under Section 564(b)(1) of the Act, 21 U.S.C. section 360bbb-3(b)(1), unless the authorization is terminated or revoked sooner.  Performed at [facility], [address], [place], [place] [address]     Coagulation Studies: No results for input(s): LABPROT, INR in the last 72 hours.  Urinalysis: Recent Labs    09/12/19 0807  COLORURINE YELLOW  LABSPEC 1.015  PHURINE 5.0  GLUCOSEU NEGATIVE  HGBUR NEGATIVE  BILIRUBINUR NEGATIVE  KETONESUR NEGATIVE  PROTEINUR 30*  NITRITE NEGATIVE  LEUKOCYTESUR NEGATIVE      Imaging: US RENAL  Result Date: 09/12/2019 CLINICAL DATA:  Acute renal failure. EXAM: RENAL / URINARY TRACT ULTRASOUND COMPLETE COMPARISON:  None. FINDINGS: Right Kidney: Renal measurements: 12.0 x 7.0 x 6.8 cm = volume: 300 mL . Echogenicity within normal limits. No mass or hydronephrosis visualized. Left Kidney: Renal measurements: 12.9 x 6.9 x 6.4 cm = volume: 296 mL. Echogenicity within normal limits. No mass or hydronephrosis visualized. Bladder: Appears normal for degree of bladder  distention. Ureteral jets are not visualized. Other: None. IMPRESSION: No significant renal abnormality is noted. Electronically Signed   By: Marijo Conception M.D.   On: 09/12/2019 08:34   DG Chest Port 1 View  Result Date: 09/12/2019 CLINICAL DATA:  Shortness of breath and increased fluid EXAM: PORTABLE CHEST 1 VIEW COMPARISON:  06/16/2019 FINDINGS: Cardiomegaly. Prior CABG. Vascular congestion without Kerley lines or effusion. No consolidation or pneumothorax. IMPRESSION: Chronic cardiomegaly with mild vascular congestion. Electronically Signed   By: Monte Fantasia M.D.   On: 09/12/2019 05:53   ECHOCARDIOGRAM COMPLETE  Result Date: 09/12/2019    ECHOCARDIOGRAM REPORT   Patient Name:   [facility] Date of Exam: 09/12/2019 Medical Rec #:  [id number]      Height:       69.0 in Accession #:    [id number]     Weight:       266.0 lb Date of Birth:  January 11, 1966       BSA:          2.332 m Patient Age:    10 years       BP:  117/95 mmHg Patient Gender: M              HR:           82 bpm. Exam Location:  [facility] Procedure: 2D Echo, Cardiac Doppler and Color Doppler Indications:    CHF-Acute Systolic 629.47 / M54.65  History:        Patient has prior history of Echocardiogram examinations, most                 recent 07/13/2019. CHF, CAD and Previous Myocardial Infarction,                 Prior CABG, Arrythmias:Atrial Fibrillation; Risk                 Factors:Hypertension, Diabetes, Dyslipidemia and Current Smoker.                 OSA (obstructive sleep apnea), h/o Cocaine abuse, Obesity.  Sonographer:    Alvino Chapel RCS Referring Phys: [place]  1. Left ventricular ejection fraction, by estimation, is approximately 20%. The left ventricle has severely decreased function. The left ventricle demonstrates global hypokinesis with some regional variation. The left ventricular internal cavity size was mildly dilated. There is mild left ventricular hypertrophy. Left ventricular  diastolic parameters are indeterminate in the setting of possible atrial flutter.  2. No LV mural thrombus with Definity contrast.  3. Right ventricular systolic function is severely reduced. The right ventricular size is mildly enlarged. There is mildly elevated pulmonary artery systolic pressure. The estimated right ventricular systolic pressure is 03.5 mmHg.  4. Left atrial size was severely dilated.  5. Right atrial size was moderately dilated.  6. The mitral valve is grossly normal. Mild mitral valve regurgitation.  7. The aortic valve is tricuspid. Aortic valve regurgitation is not visualized.  8. The inferior vena cava is dilated in size with <50% respiratory variability, suggesting right atrial pressure of 15 mmHg. FINDINGS  Left Ventricle: Left ventricular ejection fraction, by estimation, is 20%. The left ventricle has severely decreased function. The left ventricle demonstrates global hypokinesis. Definity contrast agent was given IV to delineate the left ventricular endocardial borders. The left ventricular internal cavity size was mildly dilated. There is mild left ventricular hypertrophy. Left ventricular diastolic parameters are indeterminate. Right Ventricle: The right ventricular size is mildly enlarged. No increase in right ventricular wall thickness. Right ventricular systolic function is severely reduced. There is mildly elevated pulmonary artery systolic pressure. The tricuspid regurgitant velocity is 2.39 m/s, and with an assumed right atrial pressure of 15 mmHg, the estimated right ventricular systolic pressure is 46.5 mmHg. Left Atrium: Left atrial size was severely dilated. Right Atrium: Right atrial size was moderately dilated. Pericardium: There is no evidence of pericardial effusion. Mitral Valve: The mitral valve is grossly normal. There is mild thickening of the mitral valve leaflet(s). Mild mitral valve regurgitation. Tricuspid Valve: The tricuspid valve is grossly normal. Tricuspid  valve regurgitation is mild. Aortic Valve: The aortic valve is tricuspid. Aortic valve regurgitation is not visualized. Mild to moderate aortic valve annular calcification. Pulmonic Valve: The pulmonic valve was grossly normal. Pulmonic valve regurgitation is trivial. Aorta: The aortic root is normal in size and structure. Venous: The inferior vena cava is dilated in size with less than 50% respiratory variability, suggesting right atrial pressure of 15 mmHg. IAS/Shunts: No atrial level shunt detected by color flow Doppler.  LEFT VENTRICLE PLAX 2D LVIDd:  6.04 cm LVIDs:         5.43 cm LV PW:         1.08 cm LV IVS:        1.23 cm LVOT diam:     2.00 cm LV SV:         32 LV SV Index:   14 LVOT Area:     3.14 cm  LV Volumes (MOD) LV vol d, MOD A2C: 158.0 ml LV vol d, MOD A4C: 170.0 ml LV vol s, MOD A2C: 114.0 ml LV vol s, MOD A4C: 132.0 ml LV SV MOD A2C:     44.0 ml LV SV MOD A4C:     170.0 ml LV SV MOD BP:      42.2 ml LEFT ATRIUM              Index       RIGHT ATRIUM           Index LA diam:        5.50 cm  2.36 cm/m  RA Area:     26.50 cm LA Vol (A2C):   117.0 ml 50.17 ml/m RA Volume:   96.50 ml  41.38 ml/m LA Vol (A4C):   119.0 ml 51.02 ml/m LA Biplane Vol: 125.0 ml 53.60 ml/m  AORTIC VALVE LVOT Vmax:   61.50 cm/s LVOT Vmean:  41.800 cm/s LVOT VTI:    0.102 m  AORTA Ao Root diam: 3.60 cm MITRAL VALVE               TRICUSPID VALVE MV Area (PHT): 6.71 cm    TR Peak grad:   22.8 mmHg MV Decel Time: 113 msec    TR Vmax:        239.00 cm/s MV E velocity: 78.30 cm/s                            SHUNTS                            Systemic VTI:  0.10 m                            Systemic Diam: 2.00 cm Rozann Lesches MD Electronically signed by Rozann Lesches MD Signature Date/Time: 09/12/2019/2:10:13 PM    Final      Medications:   . sodium chloride     . aspirin EC  81 mg Oral Daily  . atorvastatin  80 mg Oral q1800  . busPIRone  5 mg Oral BID  . carvedilol  12.5 mg Oral BID  . citalopram  20 mg  Oral Daily  . clotrimazole   Topical BID  . furosemide  60 mg Intravenous Q12H  . heparin  5,000 Units Subcutaneous Q8H  . insulin aspart  0-5 Units Subcutaneous QHS  . insulin aspart  0-9 Units Subcutaneous TID WC  . pantoprazole  40 mg Oral Daily  . rOPINIRole  0.5 mg Oral Q12H  . sodium chloride flush  3 mL Intravenous Q12H   sodium chloride, acetaminophen, albuterol, diphenhydrAMINE-zinc acetate, ondansetron (ZOFRAN) IV, sodium chloride flush  Assessment/ Plan:   Acute kidney injury in the setting of decompensated congestive heart failure.  Discontinued Entresto.  Urinalysis appears to be bland ultrasound normal.  Possible ischemic ATN.  Some orthostatic symptomatology.  No indications for dialysis renal function appears to be  returning to baseline.  We will continue to follow  Hypertension/volume continue to hold Entresto and Aldactone.  Blood pressure appears to be stable.  No indications for dialysis receiving IV furosemide 60 mg every 12 hours.  Hyponatremia we will continue to follow should improve with loop diuretic  Hypocalcemia we will need to check renal panel to evaluate albumin.  Diabetes mellitus as per primary service  History of reflux disease continue PPI no evidence of acute interstitial nephritis.   LOS: [place] @TODAY @8 :35 AM

## 2019-09-13 NOTE — Progress Notes (Signed)
TR[facility] night shift for coverage.  The staff reports that the patient has a pruritic/erythematosus area and is requesting topical relief.  Clotrimazole and topical Benadryl plus zinc ordered earlier.  Tennis Must, MD

## 2019-09-13 NOTE — Progress Notes (Signed)
Pt educated on importance on not ambulating without assistance. Continues to ambulate around his room and to nursing station. Fall mat in place. Bed in lowest position. Call bell in reach.

## 2019-09-14 LAB — BASIC METABOLIC PANEL
Anion gap: 12 (ref 5–15)
BUN: 66 mg/dL — ABNORMAL HIGH (ref 6–20)
CO2: 27 mmol/L (ref 22–32)
Calcium: 7.6 mg/dL — ABNORMAL LOW (ref 8.9–10.3)
Chloride: 97 mmol/L — ABNORMAL LOW (ref 98–111)
Creatinine, Ser: 1.86 mg/dL — ABNORMAL HIGH (ref 0.61–1.24)
GFR calc Af Amer: 46 mL/min — ABNORMAL LOW (ref >60)
GFR calc non Af Amer: 40 mL/min — ABNORMAL LOW (ref >60)
Glucose, Bld: 239 mg/dL — ABNORMAL HIGH (ref 70–99)
Potassium: 3.2 mmol/L — ABNORMAL LOW (ref 3.5–5.1)
Sodium: 136 mmol/L (ref 135–145)

## 2019-09-14 LAB — GLUCOSE, CAPILLARY
Glucose-Capillary: 179 mg/dL — ABNORMAL HIGH (ref 70–99)
Glucose-Capillary: 213 mg/dL — ABNORMAL HIGH (ref 70–99)
Glucose-Capillary: 268 mg/dL — ABNORMAL HIGH (ref 70–99)
Glucose-Capillary: 267 mg/dL — ABNORMAL HIGH (ref 70–99)

## 2019-09-14 LAB — ALBUMIN: Albumin: 3.3 g/dL — ABNORMAL LOW (ref 3.5–5.0)

## 2019-09-14 LAB — T4, FREE: Free T4: 1.44 ng/dL — ABNORMAL HIGH (ref 0.61–1.12)

## 2019-09-14 MED ORDER — FUROSEMIDE 40 MG PO TABS: 40.0000 mg | ORAL_TABLET | Freq: Two times a day (BID) | ORAL | Status: DC

## 2019-09-14 MED ORDER — FUROSEMIDE 10 MG/ML IJ SOLN
60.0000 mg | Freq: Two times a day (BID) | INTRAMUSCULAR | Status: DC
  Filled 2019-09-14: qty 6

## 2019-09-14 MED ORDER — POTASSIUM CHLORIDE CRYS ER 20 MEQ PO TBCR
40.0000 meq | EXTENDED_RELEASE_TABLET | Freq: Once | ORAL | Status: AC
  Administered 2019-09-14: 40 meq via ORAL
  Filled 2019-09-14: qty 2

## 2019-09-14 MED ORDER — MORPHINE SULFATE (PF) 2 MG/ML IV SOLN
2.0000 mg | INTRAVENOUS | Status: DC | PRN
  Administered 2019-09-14 – 2019-09-15 (×5): 2 mg via INTRAVENOUS
  Filled 2019-09-14 (×5): qty 1

## 2019-09-14 NOTE — Progress Notes (Signed)
PROGRESS NOTE    [name]  CXK:[id number] DOB: [birth year] DOA: 09/12/2019 PCP: Shane Bis, MD   Chief Complaint  Patient presents with  . Shortness of Breath    Brief Narrative:  Shane Frye is a 54 y.o. male with medical history significant of COPD, depression/anxiety, type 2 diabetes mellitus with nephropathy, gastroesophageal reflux disease, hypertension, hyperlipidemia, history of coronary artery disease and systolic heart failure; who presented to the hospital secondary to worsening shortness of breath (at rest and on exertion), and increased swelling.  Patient reports couple recent admissions to [facility] for similar symptoms and despite intervention while hospitalized no significant improvement of his symptoms after discharge.  Patient reports to be taking his medications as prescribed, but expressed having some diet indiscretion.  He has noticed an increase in over 20 pounds of weight, lower extremity swelling significantly with blister development from fluid overload and also positive orthopnea.    Upon arrival to ED, chest x-ray demonstrating vascular congestion along with cardiomegaly, BMP over 2000, on examination fluid overload has been appreciated and his blood work is demonstrating mild hyponatremia, metabolic acidosis and significant worsening of renal function with a creatinine up to 4.10.  Patient was admitted under hospitalist service.  Nephrology and cardiology was consulted.  Patient was diagnosed with acute on chronic systolic congestive heart failure as well as cardiorenal syndrome causing his acute on chronic renal failure.  He was started on IV Lasix.  His nephrotoxic agents including Entresto and Aldactone were held.   Assessment & Plan: 1-Acute on chronic systolic CHF (congestive heart failure) (HCC) -Renal function has improved after holding nephrotoxic agents and now very close to his baseline.  Currently 1.86 of creatinine.  Per nephrology  recommendations, continue IV diuresis. Continue Coreg at current dose. -Daily weight, low-sodium diet, strict I's and O's and limited fluid intake, once again encouraged.  Patient denied any shortness of breath.  He still has lower extremity edema.  2-acute on chronic renal failure/hyponatremia metabolic acidosis -Stage II-IIIa (transitioning stage of baseline). -Appears to be secondary to prerenal azotemia and continue use of nephrotoxic agents. -Continue IV Lasix per nephrology recommendation-continue holding Entresto and Aldactone. -Avoid hypotension. -Creatinine down to 21.8; peaked at 4.10 on presentation -sodium level up to 136 currently.  3-HLD (hyperlipidemia) -Continue statins.  4-morbid obesity -Body mass index is 40.24 kg/m.  -Low calorie diet, portion control increase physical activity discussed with patient.  5-gastroesophageal reflux disease -Continue PPI  6-type 2 diabetes with nephropathy -Continue holding oral hypoglycemic agents -Continue sliding scale insulin and follow CBGs -A1c 9.2  7-hx of atrial flutter -Continue beta-blocker for rate control -Patient started on Eliquis. -stable currently  8-chronic resp failure with hypoxia due to COPD -No wheezing: -Continue bronchodilator management and follow breathing status. -Per family reports he uses oxygen at home intermittently; will follow O2 sats and resume the use of 2 L nasal cannula as needed.  Currently is on room air and saturating well over 90%.  9-HLD -continue statins  10-RLS -continue requip.  11: Bilateral lower extremity wounds.  Patient is getting daily dressing changes however he believes that his wounds are getting worse with distal end of dressings.  RN to consult wound care.   DVT prophylaxis: eliquis. Code Status: Full code Family Communication: No family at bedside; discussed in length with patient.  He verbalized understanding. Disposition:   Status is: Inpatient  Dispo: The  patient is from: Home  Anticipated d/c is to: Home              Anticipated d/c date is: 09/15/2019              Patient currently no medically ready for discharge; needs continued IV Lasix per nephrology recommendation.       Consultants:   Cardiology service  Nephrology service   Procedures:  See below for x-ray reports.   2D echo: Pending   Antimicrobials:  None   Subjective: Seen and examined.  Overall feels much better.  No shortness of breath.  Still has lower extremity edema.  Is major concern today is worsening of lower extremity wounds that he claims.  He believes that current dressing changes and therapy is actually making his wounds look worse.  I discussed with RN at the bedside who is going to reach out to the wound care for their recommendations.  Objective: Vitals:   09/13/19 0529 09/13/19 1446 09/13/19 2129 09/14/19 0526  BP: (!) 114/99 (!) 131/117 (!) 110/88 118/85  Pulse: 80 83 85 85  Resp: 20  20 20   Temp: 97.6 F (36.4 C) 98 F (36.7 C)  98.2 F (36.8 C)  TempSrc:      SpO2: 97% 98% 100% 98%  Weight: (!) 124 kg   (!) 123.6 kg  Height:        Intake/Output Summary (Last 24 hours) at 09/14/2019 1127 Last data filed at 09/14/2019 0526 Gross per 24 hour  Intake --  Output 4500 ml  Net -4500 ml   Filed Weights   09/12/19 0440 09/13/19 0529 09/14/19 0526  Weight: (!) 120.7 kg (!) 124 kg (!) 123.6 kg    Examination:  General exam: Appears calm and comfortable  Respiratory system: Clear to auscultation. Respiratory effort normal. Cardiovascular system: S1 & S2 heard, RRR. No JVD, murmurs, rubs, gallops or clicks.  +1 pitting edema bilateral lower extremity.  Had dressing in bilateral lower extremities which I did not remove. Gastrointestinal system: Abdomen is nondistended, soft and nontender. No organomegaly or masses felt. Normal bowel sounds heard. Central nervous system: Alert and oriented. No focal neurological  deficits. Extremities: Symmetric 5 x 5 power. Psychiatry: Judgement and insight appear normal. Mood & affect appropriate.    Data Reviewed: I have personally reviewed following labs and imaging studies  CBC: Recent Labs  Lab 09/12/19 0512  WBC 12.7*  NEUTROABS 8.6*  HGB 12.8*  HCT 40.0  MCV 79.8*  PLT 494    Basic Metabolic Panel: Recent Labs  Lab 09/12/19 0512 09/12/19 0729 09/13/19 0558 09/14/19 0554  NA 130*  --  132* 136  K 4.0  --  3.5 3.2*  CL 94*  --  97* 97*  CO2 18*  --  23 27  GLUCOSE 180*  --  219* 239*  BUN 90*  --  90* 66*  CREATININE 4.10*  --  2.91* 1.86*  CALCIUM 8.1*  --  7.6* 7.6*  MG  --  1.9  --   --     GFR: Estimated Creatinine Clearance: 57.6 mL/min (A) (by C-G formula based on SCr of 1.86 mg/dL (H)).  CBG: Recent Labs  Lab 09/13/19 0756 09/13/19 1131 09/13/19 1640 09/13/19 2102 09/14/19 0730  GLUCAP 198* 202* 206* 202* 179*     Recent Results (from the past 240 hour(s))  SARS Coronavirus 2 by RT PCR (hospital order, performed in [facility] hospital lab) Nasopharyngeal Nasopharyngeal Swab     Status: None   Collection  Time: 09/12/19  8:07 AM   Specimen: Nasopharyngeal Swab  Result Value Ref Range Status   SARS Coronavirus 2 NEGATIVE NEGATIVE Final    Comment: (NOTE) SARS-CoV-2 target nucleic acids are NOT DETECTED.  The SARS-CoV-2 RNA is generally detectable in upper and lower respiratory specimens during the acute phase of infection. The lowest concentration of SARS-CoV-2 viral copies this assay can detect is 250 copies / mL. A negative result does not preclude SARS-CoV-2 infection and should not be used as the sole basis for treatment or other patient management decisions.  A negative result may occur with improper specimen collection / handling, submission of specimen other than nasopharyngeal swab, presence of viral mutation(s) within the areas targeted by this assay, and inadequate number of viral copies (<250 copies /  mL). A negative result must be combined with clinical observations, patient history, and epidemiological information.  Fact Sheet for Patients:   StrictlyIdeas.no  Fact Sheet for Healthcare Providers: BankingDealers.co.za  This test is not yet approved or  cleared by the [place] FDA and has been authorized for detection and/or diagnosis of SARS-CoV-2 by FDA under an Emergency Use Authorization (EUA).  This EUA will remain in effect (meaning this test can be used) for the duration of the COVID-19 declaration under Section 564(b)(1) of the Act, 21 U.S.C. section 360bbb-3(b)(1), unless the authorization is terminated or revoked sooner.  Performed at [facility], [address], [place], [place] [address]      Radiology Studies: ECHOCARDIOGRAM COMPLETE  Result Date: 09/12/2019    ECHOCARDIOGRAM REPORT   Patient Name:   Shane Frye Date of Exam: 09/12/2019 Medical Rec #:  [id number]      Height:       69.0 in Accession #:    [id number]     Weight:       266.0 lb Date of Birth:  May 18, 1965       BSA:          2.332 m Patient Age:    53 years       BP:           117/95 mmHg Patient Gender: M              HR:           82 bpm. Exam Location:  [facility] Procedure: 2D Echo, Cardiac Doppler and Color Doppler Indications:    CHF-Acute Systolic 967.59 / F63.84  History:        Patient has prior history of Echocardiogram examinations, most                 recent 07/13/2019. CHF, CAD and Previous Myocardial Infarction,                 Prior CABG, Arrythmias:Atrial Fibrillation; Risk                 Factors:Hypertension, Diabetes, Dyslipidemia and Current Smoker.                 OSA (obstructive sleep apnea), h/o Cocaine abuse, Obesity.  Sonographer:    Alvino Chapel RCS Referring Phys: [place]  1. Left ventricular ejection fraction, by estimation, is approximately 20%. The left ventricle has severely decreased function. The left  ventricle demonstrates global hypokinesis with some regional variation. The left ventricular internal cavity size was mildly dilated. There is mild left ventricular hypertrophy. Left ventricular diastolic parameters are indeterminate in the setting of possible atrial flutter.  2. No LV  mural thrombus with Definity contrast.  3. Right ventricular systolic function is severely reduced. The right ventricular size is mildly enlarged. There is mildly elevated pulmonary artery systolic pressure. The estimated right ventricular systolic pressure is 63.8 mmHg.  4. Left atrial size was severely dilated.  5. Right atrial size was moderately dilated.  6. The mitral valve is grossly normal. Mild mitral valve regurgitation.  7. The aortic valve is tricuspid. Aortic valve regurgitation is not visualized.  8. The inferior vena cava is dilated in size with <50% respiratory variability, suggesting right atrial pressure of 15 mmHg. FINDINGS  Left Ventricle: Left ventricular ejection fraction, by estimation, is 20%. The left ventricle has severely decreased function. The left ventricle demonstrates global hypokinesis. Definity contrast agent was given IV to delineate the left ventricular endocardial borders. The left ventricular internal cavity size was mildly dilated. There is mild left ventricular hypertrophy. Left ventricular diastolic parameters are indeterminate. Right Ventricle: The right ventricular size is mildly enlarged. No increase in right ventricular wall thickness. Right ventricular systolic function is severely reduced. There is mildly elevated pulmonary artery systolic pressure. The tricuspid regurgitant velocity is 2.39 m/s, and with an assumed right atrial pressure of 15 mmHg, the estimated right ventricular systolic pressure is 75.6 mmHg. Left Atrium: Left atrial size was severely dilated. Right Atrium: Right atrial size was moderately dilated. Pericardium: There is no evidence of pericardial effusion. Mitral Valve:  The mitral valve is grossly normal. There is mild thickening of the mitral valve leaflet(s). Mild mitral valve regurgitation. Tricuspid Valve: The tricuspid valve is grossly normal. Tricuspid valve regurgitation is mild. Aortic Valve: The aortic valve is tricuspid. Aortic valve regurgitation is not visualized. Mild to moderate aortic valve annular calcification. Pulmonic Valve: The pulmonic valve was grossly normal. Pulmonic valve regurgitation is trivial. Aorta: The aortic root is normal in size and structure. Venous: The inferior vena cava is dilated in size with less than 50% respiratory variability, suggesting right atrial pressure of 15 mmHg. IAS/Shunts: No atrial level shunt detected by color flow Doppler.  LEFT VENTRICLE PLAX 2D LVIDd:         6.04 cm LVIDs:         5.43 cm LV PW:         1.08 cm LV IVS:        1.23 cm LVOT diam:     2.00 cm LV SV:         32 LV SV Index:   14 LVOT Area:     3.14 cm  LV Volumes (MOD) LV vol d, MOD A2C: 158.0 ml LV vol d, MOD A4C: 170.0 ml LV vol s, MOD A2C: 114.0 ml LV vol s, MOD A4C: 132.0 ml LV SV MOD A2C:     44.0 ml LV SV MOD A4C:     170.0 ml LV SV MOD BP:      42.2 ml LEFT ATRIUM              Index       RIGHT ATRIUM           Index LA diam:        5.50 cm  2.36 cm/m  RA Area:     26.50 cm LA Vol (A2C):   117.0 ml 50.17 ml/m RA Volume:   96.50 ml  41.38 ml/m LA Vol (A4C):   119.0 ml 51.02 ml/m LA Biplane Vol: 125.0 ml 53.60 ml/m  AORTIC VALVE LVOT Vmax:   61.50 cm/s LVOT Vmean:  41.800 cm/s LVOT VTI:    0.102 m  AORTA Ao Root diam: 3.60 cm MITRAL VALVE               TRICUSPID VALVE MV Area (PHT): 6.71 cm    TR Peak grad:   22.8 mmHg MV Decel Time: 113 msec    TR Vmax:        239.00 cm/s MV E velocity: 78.30 cm/s                            SHUNTS                            Systemic VTI:  0.10 m                            Systemic Diam: 2.00 cm Rozann Lesches MD Electronically signed by Rozann Lesches MD Signature Date/Time: 09/12/2019/2:10:13 PM    Final      Scheduled Meds: . apixaban  5 mg Oral BID  . aspirin EC  81 mg Oral Daily  . atorvastatin  80 mg Oral q1800  . busPIRone  5 mg Oral BID  . carvedilol  12.5 mg Oral BID  . citalopram  20 mg Oral Daily  . clotrimazole   Topical BID  . furosemide  60 mg Intravenous BID  . insulin aspart  0-5 Units Subcutaneous QHS  . insulin aspart  0-9 Units Subcutaneous TID WC  . pantoprazole  40 mg Oral Daily  . rOPINIRole  0.5 mg Oral Q12H  . sodium chloride flush  3 mL Intravenous Q12H   Continuous Infusions: . sodium chloride       LOS: 2 days    Time spent: 37 minutes.  Darliss Cheney, MD [facility]   To contact the attending provider between 7A-7P or the covering provider during after hours 7P-7A, please log into the web site [email or web address] and access using universal [place] password for that web site. If you do not have the password, please call the hospital operator.  09/14/2019, 11:27 AM

## 2019-09-14 NOTE — Progress Notes (Addendum)
Progress Note  Patient Name: Shane Frye Date of Encounter: 09/14/2019  Primary Cardiologist: Carlyle Dolly, MD   Subjective   Breathing improved. No orthopnea or PND. No chest pain or palpitations.    Reports pain along his lower extremities. Says he had difficulty sleeping last night.   Inpatient Medications    Scheduled Meds: . apixaban  5 mg Oral BID  . aspirin EC  81 mg Oral Daily  . atorvastatin  80 mg Oral q1800  . busPIRone  5 mg Oral BID  . carvedilol  12.5 mg Oral BID  . citalopram  20 mg Oral Daily  . clotrimazole   Topical BID  . furosemide  40 mg Oral BID  . insulin aspart  0-5 Units Subcutaneous QHS  . insulin aspart  0-9 Units Subcutaneous TID WC  . pantoprazole  40 mg Oral Daily  . potassium chloride  40 mEq Oral Once  . rOPINIRole  0.5 mg Oral Q12H  . sodium chloride flush  3 mL Intravenous Q12H   Continuous Infusions: . sodium chloride     PRN Meds: sodium chloride, acetaminophen, albuterol, diphenhydrAMINE-zinc acetate, ondansetron (ZOFRAN) IV, sodium chloride flush   Vital Signs    Vitals:   09/13/19 0529 09/13/19 1446 09/13/19 2129 09/14/19 0526  BP: (!) 114/99 (!) 131/117 (!) 110/88 118/85  Pulse: 80 83 85 85  Resp: 20  20 20   Temp: 97.6 F (36.4 C) 98 F (36.7 C)  98.2 F (36.8 C)  TempSrc:      SpO2: 97% 98% 100% 98%  Weight: (!) 124 kg   (!) 123.6 kg  Height:        Intake/Output Summary (Last 24 hours) at 09/14/2019 0906 Last data filed at 09/14/2019 0526 Gross per 24 hour  Intake --  Output 4500 ml  Net -4500 ml    Last 3 Weights 09/14/2019 09/13/2019 09/12/2019  Weight (lbs) 272 lb 7.8 oz 273 lb 5.9 oz 266 lb  Weight (kg) 123.6 kg 124 kg 120.657 kg      Telemetry    Atrial flutter, HR in 70's to 80's. - Personally Reviewed  ECG    No new tracings.   Physical Exam   General: Obese male appearing in no acute distress. Head: Normocephalic, atraumatic.  Neck: Supple without bruits, JVD at 9 cm. Lungs:  Resp  regular and unlabored, decreased along bases bilaterally. Heart: Irregularly irregular, S1, S2, no S3, S4, or murmur; no rub. Abdomen: Soft, non-tender, non-distended with normoactive bowel sounds. No hepatomegaly. No rebound/guarding. No obvious abdominal masses. Extremities: No clubbing or cyanosis, 2+ pitting edema. Wraps in place.  Distal pedal pulses are 2+ bilaterally. Neuro: Alert and oriented X 3. Moves all extremities spontaneously. Psych: Normal affect.  Labs    Chemistry Recent Labs  Lab 09/12/19 0512 09/13/19 0558 09/14/19 0554  NA 130* 132* 136  K 4.0 3.5 3.2*  CL 94* 97* 97*  CO2 18* 23 27  GLUCOSE 180* 219* 239*  BUN 90* 90* 66*  CREATININE 4.10* 2.91* 1.86*  CALCIUM 8.1* 7.6* 7.6*  GFRNONAA 15* 23* 40*  GFRAA 18* 27* 46*  ANIONGAP 18* 12 12     Hematology Recent Labs  Lab 09/12/19 0512  WBC 12.7*  RBC 5.01  HGB 12.8*  HCT 40.0  MCV 79.8*  MCH 25.5*  MCHC 32.0  RDW 15.5  PLT 392    Cardiac EnzymesNo results for input(s): TROPONINI in the last 168 hours. No results for input(s): TROPIPOC in  the last 168 hours.   BNP Recent Labs  Lab 09/12/19 0512  BNP 2,047.0*     DDimer No results for input(s): DDIMER in the last 168 hours.   Radiology     Cardiac Studies   Echocardiogram: 09/12/2019 IMPRESSIONS    1. Left ventricular ejection fraction, by estimation, is approximately  20%. The left ventricle has severely decreased function. The left  ventricle demonstrates global hypokinesis with some regional variation.  The left ventricular internal cavity size  was mildly dilated. There is mild left ventricular hypertrophy. Left  ventricular diastolic parameters are indeterminate in the setting of  possible atrial flutter.  2. No LV mural thrombus with Definity contrast.  3. Right ventricular systolic function is severely reduced. The right  ventricular size is mildly enlarged. There is mildly elevated pulmonary  artery systolic pressure.  The estimated right ventricular systolic  pressure is 36.4 mmHg.  4. Left atrial size was severely dilated.  5. Right atrial size was moderately dilated.  6. The mitral valve is grossly normal. Mild mitral valve regurgitation.  7. The aortic valve is tricuspid. Aortic valve regurgitation is not  visualized.  8. The inferior vena cava is dilated in size with <50% respiratory  variability, suggesting right atrial pressure of 15 mmHg.   Patient Profile     54 y.o. male w/ PMH of chronic systolic CHF (EF 68-03% by echo in 06/2018, at 20% by repeat echo in 08/2019), CAD (s/p CABG in 10/2013 with cath in 06/2018 showing occluded SVG-OM, SVG-RI and SVG-PDA with patent LIMA-LAD and no targets for PCI or re-do CABG), post-operative atrial fibrillation (occurring in 2015), substance use (prior UDS positive for Cocaine), HTN and HLD who is currently admitted for an acute CHF exacerbation complicated by AKI.   Assessment & Plan    1. Chronic Systolic CHF/Ischemic Cardiomyopathy - EF was previously 25-30% by echo in 06/2018, at 20% this admission.  - BNP was elevated to 2047 on arrival. He remains on IV Lasix 60mg  BID with a recorded net output of - 4.8L thus far. Weight at 272 lbs (previous baseline of 239 lbs in 06/2019). Will defer diuretic management to Nephrology.  - Remains on Coreg 12.5mg  BID. Was on Entresto and Spironolactone prior to admission which are currently held given his AKI. Can perhaps resume Entresto at a lower dose of 24-26mg  BID prior to discharge in renal function normalizes. If not, would consider Hydralazine/Nitrates if BP allows.   2. Persistent Atrial Flutter - He was started on PO Amiodarone following a recent admission at [facility] but has not converted back to NSR. Given his LA dilation, a rate-control strategy will likely be pursued. Reviewed with Dr. Domenic Polite and will discontinue Amiodarone.  - He has been restarted on Eliquis 5mg  BID for anticoagulation.   3.  CAD - He is s/p CABG in 10/2013 with cath in 06/2018 showing occluded SVG-OM, SVG-RI and SVG-PDA with patent LIMA-LAD and no targets for PCI or re-do CABG.  - HS Troponin values have been negative this admission and he denies any recent chest pain.  - Continue ASA 81mg  daily, Atorvastatin 80mg  daily and Coreg 12.5mg  BID.   4. HTN - BP stable at 110/85 - 131/117 within the past 24 hours. Remains on Coreg 12.5mg  BID.   5. 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 K+ low at 3.2 and replacement  has already been ordered. Nephrology following.   6. Elevated TSH - TSH elevated to 5.035 this admission and Free T4 currently pending.    For questions or updates, please contact [place] Please consult [email or web address] for contact info under Cardiology/STEMI.   Arna Medici , PA-C 9:06 AM 09/14/2019 Pager: [phone]   Attending note:  Reviewed interval hospital course and discussed the case with Ms. Delano Metz, I agree with her above findings. Mr. Laymon has had a substantial diuresis since yesterday, renal function continues to improve with creatinine down to 1.86, mild hypokalemia today. Complains of leg pain.  Vital signs have been stable with adequate heart rate control and atrial flutter by telemetry. He continues on Eliquis, Coreg, Lipitor, and IV Lasix with potassium supplements. Entresto and Aldactone were discontinued.  Would continue with present course aiming to work back to an oral diuretic, although would not resume Aldactone. Whether or not he is absolutely not a candidate to resume low-dose Delene Loll is not clear however, this depends on his baseline renal function. Otherwise, hydralazine/nitrate combination could be considered eventually.  Satira Sark, M.D., F.A.C.C.

## 2019-09-14 NOTE — Progress Notes (Signed)
Inpatient Diabetes Program Recommendations  AACE/ADA: New Consensus Statement on Inpatient Glycemic Control   Target Ranges:  Prepandial:   less than 140 mg/dL      Peak postprandial:   less than 180 mg/dL (1-2 hours)      Critically ill patients:  140 - 180 mg/dL   Results for Shane Frye, Shane Frye (MRN [id number]) as of 09/14/2019 10:58  Ref. Range 09/13/2019 07:56 09/13/2019 11:31 09/13/2019 16:40 09/13/2019 21:02 09/14/2019 07:30  Glucose-Capillary Latest Ref Range: 70 - 99 mg/dL 198 (H) 202 (H) 206 (H) 202 (H) 179 (H)   Review of Glycemic Control  Diabetes history: DM2 Outpatient Diabetes medications: 70/30 20 units TID with meals, Metformin 1000 mg BID Current orders for Inpatient glycemic control: Novolog 0-9 units TID with meals, Novolog 0-5 units QHS  Inpatient Diabetes Program Recommendations:    Insulin-Meal Coverage: Please consider ordering Novolog 3 units TID with meals for meal coverage if patient eats at least 50% of meals.  Thanks, Barnie Alderman, RN, MSN, CDE Diabetes Coordinator Inpatient Diabetes Program [phone] (Team Pager from 8am to 5pm)

## 2019-09-14 NOTE — Care Management Important Message (Signed)
Important Message  Patient Details  Name: Shane Frye MRN: [id number] Date of Birth: 01-19-1966   Medicare Important Message Given:  Yes     Tommy Medal 09/14/2019, 2:12 PM

## 2019-09-14 NOTE — Progress Notes (Signed)
Nephrology Follow-Up Consult note   Assessment/Recommendations: Shane Frye is a/an 54 y.o. male with a past medical history significant for COPD, DM 2, CAD, HFrEF, admitted for AKI and heart failure exacerbation.     Non-Oliguric AKI improving: Likely cardiorenal syndrome with possible tubular injury including ischemic ATN.  Patient is now responded to Lasix robustly and creatinine is dropping quickly.  Normal kidney function at baseline and hopefully he will continue to improve. -Continue to monitor renal function panel daily -Continue diuretics as below -Continue to monitor daily Cr, Dose meds for GFR -Monitor Daily I/Os, Daily weight  -Maintain MAP>65 for optimal renal perfusion.   Heart failure exacerbation: Significantly volume overloaded and remains overall volume up.  Robust response to Lasix yesterday with improving creatinine likely indicating cardiorenal syndrome contributing to kidney injury.  We will continue with IV Lasix 60 mg twice daily for today and likely change to oral Lasix tomorrow.  We will continue to hold Entresto and spironolactone at this time.  Could consider reinstitution of one or both of these medications in the outpatient setting but would need very close monitoring.  If the patient is not able to be closely monitored would prefer alternate medication such as hydralazine/nitrate.  Appreciate cardiology help  Hyponatremia/hypokalemia: K+ 3.2, Na+ 136.  Potassium now decreasing likely related to Lasix.  Agree with replacement with 40 mEq of potassium.  Sodium now improved with fluid removal.  Continue to monitor daily.  Hypocalcemia: Obtain albumin to correct calcium to see if it is actually normal.  Uncontrolled Diabetes Mellitus Type 2 with Hyperglycemia: Continue medical management per primary team   Recommendations conveyed to primary service.    [facility] 09/14/2019 9:36  AM  ___________________________________________________________  CC: AKI  Interval History/Subjective: Patient states that he is feeling a lot better.  He specifically has seen improvement in his shortness of breath and denies chest pain.  He has noticed areas of inflammation on his bilateral legs as well as weeping.  His creatinine is improving and his urine output has significantly increased.  He feels that his volume status is significantly improved.  Hyponatremia has improved and calcium is stable.   Medications:  Current Facility-Administered Medications  Medication Dose Route Frequency Provider Last Rate Last Admin  . 0.9 %  sodium chloride infusion  250 mL Intravenous PRN Barton Dubois, MD      . acetaminophen (TYLENOL) tablet 650 mg  650 mg Oral Q4H PRN Barton Dubois, MD   650 mg at 09/13/19 2104  . albuterol (PROVENTIL) (2.5 MG/3ML) 0.083% nebulizer solution 2.5 mg  2.5 mg Nebulization Q6H PRN Barton Dubois, MD   2.5 mg at 09/12/19 1710  . apixaban (ELIQUIS) tablet 5 mg  5 mg Oral BID Bernerd Pho M, PA-C   5 mg at 09/14/19 3810  . aspirin EC tablet 81 mg  81 mg Oral Daily Barton Dubois, MD   81 mg at 09/14/19 1751  . atorvastatin (LIPITOR) tablet 80 mg  80 mg Oral q1800 Barton Dubois, MD   80 mg at 09/13/19 1641  . busPIRone (BUSPAR) tablet 5 mg  5 mg Oral BID Barton Dubois, MD   5 mg at 09/14/19 0258  . carvedilol (COREG) tablet 12.5 mg  12.5 mg Oral BID Barton Dubois, MD   12.5 mg at 09/14/19 5277  . citalopram (CELEXA) tablet 20 mg  20 mg Oral Daily Barton Dubois, MD   20 mg at 09/14/19 8242  . clotrimazole (LOTRIMIN) 1 %  cream   Topical BID Reubin Milan, MD   Given by Other at 09/14/19 [phone]  . diphenhydrAMINE-zinc acetate (BENADRYL) 9-6.0 % cream 1 application  1 application Topical TID PRN Reubin Milan, MD   1 application at 45/40/98 0048  . furosemide (LASIX) injection 60 mg  60 mg Intravenous BID Reesa Chew, MD      . insulin aspart (novoLOG)  injection 0-5 Units  0-5 Units Subcutaneous QHS Barton Dubois, MD   2 Units at 09/13/19 2110  . insulin aspart (novoLOG) injection 0-9 Units  0-9 Units Subcutaneous TID WC Barton Dubois, MD   2 Units at 09/14/19 [phone]  . ondansetron (ZOFRAN) injection 4 mg  4 mg Intravenous Q6H PRN Barton Dubois, MD      . pantoprazole (PROTONIX) EC tablet 40 mg  40 mg Oral Daily Barton Dubois, MD   40 mg at 09/14/19 4782  . potassium chloride SA (KLOR-CON) CR tablet 40 mEq  40 mEq Oral Once Darliss Cheney, MD      . rOPINIRole (REQUIP) tablet 0.5 mg  0.5 mg Oral Q12H Barton Dubois, MD   0.5 mg at 09/14/19 9562  . sodium chloride flush (NS) 0.9 % injection 3 mL  3 mL Intravenous Q12H Barton Dubois, MD   3 mL at 09/13/19 2109  . sodium chloride flush (NS) 0.9 % injection 3 mL  3 mL Intravenous PRN Barton Dubois, MD          Review of Systems: 10 systems reviewed and negative except per interval history/subjective  Physical Exam: Vitals:   09/13/19 2129 09/14/19 0526  BP: (!) 110/88 118/85  Pulse: 85 85  Resp: 20 20  Temp:  98.2 F (36.8 C)  SpO2: 100% 98%   No intake/output data recorded.  Intake/Output Summary (Last 24 hours) at 09/14/2019 0936 Last data filed at 09/14/2019 0526 Gross per 24 hour  Intake --  Output 4500 ml  Net -4500 ml   Constitutional: Chronically ill-appearing, no acute distress ENMT: ears and nose without scars or lesions, MMM CV: normal rate, 1+ pitting edema to the bilateral knee Respiratory: Coarse bilateral breath sounds, normal work of breathing Gastrointestinal: soft, non-tender, no palpable masses or hernias Skin: Scattered areas of inflamed hair follicles on the bilateral legs, otherwise no visible lesions or rashes Psych: alert, judgement/insight appropriate, appropriate mood and affect   Test Results I personally reviewed new and old clinical labs and radiology tests Lab Results  Component Value Date   NA 136 09/14/2019   K 3.2 (L) 09/14/2019   CL 97  (L) 09/14/2019   CO2 27 09/14/2019   BUN 66 (H) 09/14/2019   CREATININE 1.86 (H) 09/14/2019   CALCIUM 7.6 (L) 09/14/2019   ALBUMIN 4.4 03/09/2018

## 2019-09-14 NOTE — Progress Notes (Signed)
Patient reported pain at IV site during lasix administration. No blood return noted. IV removed and attempted to place new IV. Missed first attempt and was going for a second attempt, but patient reported he was feeling sick to his stomach. Will hold off second attempt for now per patient request.

## 2019-09-15 DIAGNOSIS — I5082 Biventricular heart failure: Secondary | ICD-10-CM | POA: Diagnosis not present

## 2019-09-15 DIAGNOSIS — E1121 Type 2 diabetes mellitus with diabetic nephropathy: Secondary | ICD-10-CM | POA: Diagnosis not present

## 2019-09-15 DIAGNOSIS — I5021 Acute systolic (congestive) heart failure: Secondary | ICD-10-CM | POA: Diagnosis not present

## 2019-09-15 DIAGNOSIS — L97919 Non-pressure chronic ulcer of unspecified part of right lower leg with unspecified severity: Secondary | ICD-10-CM | POA: Diagnosis not present

## 2019-09-15 DIAGNOSIS — I454 Nonspecific intraventricular block: Secondary | ICD-10-CM | POA: Diagnosis not present

## 2019-09-15 DIAGNOSIS — L739 Follicular disorder, unspecified: Secondary | ICD-10-CM | POA: Diagnosis not present

## 2019-09-15 DIAGNOSIS — L03114 Cellulitis of left upper limb: Secondary | ICD-10-CM | POA: Diagnosis not present

## 2019-09-15 DIAGNOSIS — I5023 Acute on chronic systolic (congestive) heart failure: Secondary | ICD-10-CM | POA: Diagnosis not present

## 2019-09-15 DIAGNOSIS — I509 Heart failure, unspecified: Secondary | ICD-10-CM | POA: Diagnosis not present

## 2019-09-15 DIAGNOSIS — L03116 Cellulitis of left lower limb: Secondary | ICD-10-CM | POA: Diagnosis not present

## 2019-09-15 DIAGNOSIS — Z9114 Patient's other noncompliance with medication regimen: Secondary | ICD-10-CM | POA: Diagnosis not present

## 2019-09-15 DIAGNOSIS — I480 Paroxysmal atrial fibrillation: Secondary | ICD-10-CM | POA: Diagnosis not present

## 2019-09-15 DIAGNOSIS — I255 Ischemic cardiomyopathy: Secondary | ICD-10-CM | POA: Diagnosis not present

## 2019-09-15 DIAGNOSIS — E1142 Type 2 diabetes mellitus with diabetic polyneuropathy: Secondary | ICD-10-CM | POA: Diagnosis not present

## 2019-09-15 DIAGNOSIS — L0102 Bockhart's impetigo: Secondary | ICD-10-CM | POA: Diagnosis not present

## 2019-09-15 DIAGNOSIS — E119 Type 2 diabetes mellitus without complications: Secondary | ICD-10-CM | POA: Diagnosis not present

## 2019-09-15 DIAGNOSIS — I2510 Atherosclerotic heart disease of native coronary artery without angina pectoris: Secondary | ICD-10-CM | POA: Diagnosis not present

## 2019-09-15 DIAGNOSIS — R0602 Shortness of breath: Secondary | ICD-10-CM | POA: Diagnosis not present

## 2019-09-15 DIAGNOSIS — I4439 Other atrioventricular block: Secondary | ICD-10-CM | POA: Diagnosis not present

## 2019-09-15 DIAGNOSIS — Z7901 Long term (current) use of anticoagulants: Secondary | ICD-10-CM | POA: Diagnosis not present

## 2019-09-15 DIAGNOSIS — R238 Other skin changes: Secondary | ICD-10-CM | POA: Diagnosis not present

## 2019-09-15 DIAGNOSIS — L97221 Non-pressure chronic ulcer of left calf limited to breakdown of skin: Secondary | ICD-10-CM | POA: Diagnosis not present

## 2019-09-15 DIAGNOSIS — Z951 Presence of aortocoronary bypass graft: Secondary | ICD-10-CM | POA: Diagnosis not present

## 2019-09-15 DIAGNOSIS — E873 Alkalosis: Secondary | ICD-10-CM | POA: Diagnosis not present

## 2019-09-15 DIAGNOSIS — I5020 Unspecified systolic (congestive) heart failure: Secondary | ICD-10-CM | POA: Diagnosis not present

## 2019-09-15 DIAGNOSIS — I4892 Unspecified atrial flutter: Secondary | ICD-10-CM | POA: Diagnosis not present

## 2019-09-15 DIAGNOSIS — I110 Hypertensive heart disease with heart failure: Secondary | ICD-10-CM | POA: Diagnosis not present

## 2019-09-15 DIAGNOSIS — J449 Chronic obstructive pulmonary disease, unspecified: Secondary | ICD-10-CM | POA: Diagnosis not present

## 2019-09-15 DIAGNOSIS — L97929 Non-pressure chronic ulcer of unspecified part of left lower leg with unspecified severity: Secondary | ICD-10-CM | POA: Diagnosis not present

## 2019-09-15 DIAGNOSIS — L089 Local infection of the skin and subcutaneous tissue, unspecified: Secondary | ICD-10-CM | POA: Diagnosis not present

## 2019-09-15 DIAGNOSIS — L97211 Non-pressure chronic ulcer of right calf limited to breakdown of skin: Secondary | ICD-10-CM | POA: Diagnosis not present

## 2019-09-15 DIAGNOSIS — I440 Atrioventricular block, first degree: Secondary | ICD-10-CM | POA: Diagnosis not present

## 2019-09-15 DIAGNOSIS — Z9111 Patient's noncompliance with dietary regimen: Secondary | ICD-10-CM | POA: Diagnosis not present

## 2019-09-15 DIAGNOSIS — I493 Ventricular premature depolarization: Secondary | ICD-10-CM | POA: Diagnosis not present

## 2019-09-15 DIAGNOSIS — L03115 Cellulitis of right lower limb: Secondary | ICD-10-CM | POA: Diagnosis not present

## 2019-09-15 LAB — GLUCOSE, CAPILLARY
Glucose-Capillary: 233 mg/dL — ABNORMAL HIGH (ref 70–99)
Glucose-Capillary: 262 mg/dL — ABNORMAL HIGH (ref 70–99)
Glucose-Capillary: 180 mg/dL — ABNORMAL HIGH (ref 70–99)

## 2019-09-15 LAB — BASIC METABOLIC PANEL
Anion gap: 10 (ref 5–15)
BUN: 49 mg/dL — ABNORMAL HIGH (ref 6–20)
CO2: 29 mmol/L (ref 22–32)
Calcium: 7.5 mg/dL — ABNORMAL LOW (ref 8.9–10.3)
Chloride: 99 mmol/L (ref 98–111)
Creatinine, Ser: 1.47 mg/dL — ABNORMAL HIGH (ref 0.61–1.24)
GFR calc Af Amer: >60 mL/min (ref >60)
GFR calc non Af Amer: 53 mL/min — ABNORMAL LOW (ref >60)
Glucose, Bld: 214 mg/dL — ABNORMAL HIGH (ref 70–99)
Potassium: 3.5 mmol/L (ref 3.5–5.1)
Sodium: 138 mmol/L (ref 135–145)

## 2019-09-15 MED ORDER — FUROSEMIDE 10 MG/ML IJ SOLN
60.0000 mg | Freq: Two times a day (BID) | INTRAMUSCULAR | Status: DC
  Administered 2019-09-15: 60 mg via INTRAVENOUS
  Filled 2019-09-15: qty 6

## 2019-09-15 MED ORDER — SACUBITRIL-VALSARTAN 24-26 MG PO TABS
1.0000 | ORAL_TABLET | Freq: Two times a day (BID) | ORAL | Status: DC
  Administered 2019-09-15: 1 via ORAL
  Filled 2019-09-15: qty 1

## 2019-09-15 MED ORDER — INSULIN GLARGINE 100 UNIT/ML ~~LOC~~ SOLN
15.0000 [IU] | Freq: Every day | SUBCUTANEOUS | Status: DC
  Administered 2019-09-15: 15 [IU] via SUBCUTANEOUS
  Filled 2019-09-15 (×3): qty 0.15

## 2019-09-15 MED ORDER — FUROSEMIDE 40 MG PO TABS
40.0000 mg | ORAL_TABLET | Freq: Two times a day (BID) | ORAL | Status: DC
  Administered 2019-09-15: 40 mg via ORAL
  Filled 2019-09-15: qty 1

## 2019-09-15 NOTE — Progress Notes (Addendum)
Progress Note  Patient Name: Shane Frye Date of Encounter: 09/15/2019  Primary Cardiologist: Carlyle Dolly, MD   Subjective   Still with lower extremity edema but this has improved. Says his legs started hurting again this morning after a provider checked for pitting edema. No chest pain or palpitations. Breathing continues to improve.   Inpatient Medications    Scheduled Meds: . apixaban  5 mg Oral BID  . aspirin EC  81 mg Oral Daily  . atorvastatin  80 mg Oral q1800  . busPIRone  5 mg Oral BID  . carvedilol  12.5 mg Oral BID  . citalopram  20 mg Oral Daily  . clotrimazole   Topical BID  . furosemide  40 mg Oral BID  . insulin aspart  0-5 Units Subcutaneous QHS  . insulin aspart  0-9 Units Subcutaneous TID WC  . pantoprazole  40 mg Oral Daily  . rOPINIRole  0.5 mg Oral Q12H  . sodium chloride flush  3 mL Intravenous Q12H   Continuous Infusions: . sodium chloride     PRN Meds: sodium chloride, acetaminophen, albuterol, diphenhydrAMINE-zinc acetate, morphine injection, ondansetron (ZOFRAN) IV, sodium chloride flush   Vital Signs    Vitals:   09/14/19 1423 09/14/19 2051 09/15/19 0433 09/15/19 0456  BP: 100/74 (!) 130/104  (!) 131/86  Pulse: 83 85  90  Resp: 20 20    Temp: 98.2 F (36.8 C) 98.2 F (36.8 C)  98.2 F (36.8 C)  TempSrc:      SpO2: 94% 100%  100%  Weight:   (!) 119.7 kg   Height:        Intake/Output Summary (Last 24 hours) at 09/15/2019 0906 Last data filed at 09/15/2019 0400 Gross per 24 hour  Intake 480 ml  Output 2750 ml  Net -2270 ml    Last 3 Weights 09/15/2019 09/14/2019 09/13/2019  Weight (lbs) 264 lb 272 lb 7.8 oz 273 lb 5.9 oz  Weight (kg) 119.75 kg 123.6 kg 124 kg      Telemetry    Atrial flutter, HR in 70's to 90's. Occasional PVC's.  - Personally Reviewed  ECG    No new tracings.   Physical Exam   General: Well developed, well nourished, male appearing in no acute distress. Head: Normocephalic, atraumatic.  Neck:  Supple without bruits, JVD not elevated. Lungs:  Resp regular and unlabored, CTA without wheezing or rales. Heart: Irregularly irregular, S1, S2, no S3, S4, or murmur; no rub. Abdomen: Soft, non-tender, non-distended with normoactive bowel sounds. No hepatomegaly. No rebound/guarding. No obvious abdominal masses. Extremities: No clubbing or cyanosis, 2+ pitting edema bilaterally. Dressings in place. Distal pedal pulses are 2+ bilaterally. Neuro: Alert and oriented X 3. Moves all extremities spontaneously. Psych: Normal affect.  Labs    Chemistry Recent Labs  Lab 09/13/19 0558 09/14/19 0554 09/15/19 0434  NA 132* 136 138  K 3.5 3.2* 3.5  CL 97* 97* 99  CO2 23 27 29   GLUCOSE 219* 239* 214*  BUN 90* 66* 49*  CREATININE 2.91* 1.86* 1.47*  CALCIUM 7.6* 7.6* 7.5*  ALBUMIN  --  3.3*  --   GFRNONAA 23* 40* 53*  GFRAA 27* 46* >60  ANIONGAP 12 12 10      Hematology Recent Labs  Lab 09/12/19 0512  WBC 12.7*  RBC 5.01  HGB 12.8*  HCT 40.0  MCV 79.8*  MCH 25.5*  MCHC 32.0  RDW 15.5  PLT 392    Cardiac EnzymesNo results for input(s): TROPONINI  in the last 168 hours. No results for input(s): TROPIPOC in the last 168 hours.   BNP Recent Labs  Lab 09/12/19 0512  BNP 2,047.0*     DDimer No results for input(s): DDIMER in the last 168 hours.   Radiology    No results found.  Cardiac Studies   Echocardiogram: 08/2019 IMPRESSIONS    1. Left ventricular ejection fraction, by estimation, is approximately  20%. The left ventricle has severely decreased function. The left  ventricle demonstrates global hypokinesis with some regional variation.  The left ventricular internal cavity size  was mildly dilated. There is mild left ventricular hypertrophy. Left  ventricular diastolic parameters are indeterminate in the setting of  possible atrial flutter.  2. No LV mural thrombus with Definity contrast.  3. Right ventricular systolic function is severely reduced. The right   ventricular size is mildly enlarged. There is mildly elevated pulmonary  artery systolic pressure. The estimated right ventricular systolic  pressure is 67.6 mmHg.  4. Left atrial size was severely dilated.  5. Right atrial size was moderately dilated.  6. The mitral valve is grossly normal. Mild mitral valve regurgitation.  7. The aortic valve is tricuspid. Aortic valve regurgitation is not  visualized.  8. The inferior vena cava is dilated in size with <50% respiratory  variability, suggesting right atrial pressure of 15 mmHg.   Patient Profile     54 y.o. male w/ PMH of chronic systolic CHF (EF 19-50% by echo in 06/2018, at 20% by repeat echo in 08/2019), CAD (s/p CABG in 10/2013 with cath in 06/2018 showing occluded SVG-OM, SVG-RI and SVG-PDA with patent LIMA-LAD and no targets for PCI or re-do CABG),post-operative atrial fibrillation (occurring in 2015), substance use (prior UDS positive for Cocaine), HTN and HLDwho is currently admitted for an acute CHF exacerbation complicated by AKI.   Assessment & Plan    1. Chronic Systolic CHF/Ischemic Cardiomyopathy - He has a known reduced EF and this was at 25-30% in 06/2018, now further reduced at 20%.  - He has been receiving IV Lasix 60mg  BID with a recorded output of -7.4L thus far (-2.6 L yesterday) and weight has declined to 264 lbs. He still remains volume overloaded and his prior weight was at 239 lbs earlier this year. Defer titration of diuretic therapy to Nephrology.  - Continue Coreg 12.5mg  BID. Would anticipate restarting Entresto at a lower dose of 24-26mg  BID prior to discharge if BP allows with close outpatient follow-up.   2. Persistent Atrial Flutter - Has remained in rate-controlled atrial flutter and denies any recent palpitations. No longer on Amiodarone which was started during his recent admission at [place] 12.5mg  BID for rate-control and Eliquis 5mg  BID for anticoagulation.   3.  CAD - He is s/p CABG in 10/2013 with cath in 06/2018 showing occluded SVG-OM, SVG-RI and SVG-PDA with patent LIMA-LAD and no targets for PCI or re-do CABG.  - He denies any recent anginal symptoms and HS Troponin values have been negative. Continue with medical therapy which includes ASA, Atorvastatin and Coreg.   4. HTN - BP has been variable at 100/74 - 131/104 within the past 24 hours. Continue Coreg 12.5mg  BID. He was on Entresto 49-51mg  BID and Spironolactone 12.5mg  daily prior to admission which could have been contributing to episodes of hypotension.   5. 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 Continues to improve and is now at 1.47. Nephrology following.  6. Elevated TSH - TSH was elevated to 5.035 this admission with Free T4 at 1.44. He was on Amiodarone prior to admission which has since been discontinued. Will need to be followed by his PCP.    For questions or updates, please contact [place] Please consult [email or web address] for contact info under Cardiology/STEMI.   Arna Medici , PA-C 9:06 AM 09/15/2019 Pager: [phone]   Attending note:  Patient seen and examined, interval hospital course reviewed.  Case discussed with Ms. Ahmed Prima PA-C and agree with her above findings.  Mr. Haywood Lasso continues on IV Lasix with net output of 2.6 l last 24 hours, weight decreasing but still shows evidence of fluid overload compared to previous baseline.  He is currently afebrile, systolic blood pressure 081K, remains in atrial fibrillation with heart rate in the 80s.  Lab work includes potassium 3.5, BUN 49, creatinine down to 1.47.  At this point would suggest continuing IV diuresis.  No change to current dose of Coreg, will cautiously reinitiate Entresto at lower dose, 24/26 mg twice daily, would not resume Aldactone however.  He otherwise remains on Eliquis for stroke prophylaxis.  Satira Sark, M.D., F.A.C.C.

## 2019-09-15 NOTE — Progress Notes (Signed)
Results for REGINA, COPPOLINO (MRN [id number]) as of 09/15/2019 09:23  Ref. Range 09/14/2019 07:30 09/14/2019 11:57 09/14/2019 17:09 09/14/2019 20:52 09/15/2019 07:41  Glucose-Capillary Latest Ref Range: 70 - 99 mg/dL 179 (H) 213 (H) 268 (H) 267 (H) 233 (H)  Noted that blood sugars continue to be greater than 180 mg/dl.  Recommend adding 70/30 insulin 10 units BID at breakfast and supper. (this is 1/2 of home dose of 70/30 20 units BID) Continue Novolog SENSITIVE correction scale as ordered.   Harvel Ricks RN BSN CDE Diabetes Coordinator Pager: [phone]  8am-5pm

## 2019-09-15 NOTE — Progress Notes (Addendum)
Nephrology Follow-Up Consult note   Assessment/Recommendations: Shane Frye is a/an 54 y.o. male with a past medical history significant for COPD, DM 2, CAD, HFrEF, admitted for AKI and heart failure exacerbation.     Non-Oliguric AKI improving: Likely cardiorenal syndrome with possible tubular injury including ischemic ATN.  Since AKI is resolving quickly.  I think he is stable for discharge today.  I do not think that he needs nephrology follow-up unless his creatinine does not return to normal or he continues to have problems with rising creatinine and volume overload -Okay for discharge today -Arrange for PCP lab check in 1 week -Continue diuretics as below -Continue to hold Entresto and spironolactone at discharge  Acute heart failure exacerbation: Significantly volume overloaded and remains overall volume up but significantly improved from admission.  Patient is safe for discharge as above.  Would discharge on oral Lasix 40 mg twice daily and continue to hold Entresto and spironolactone.  Would consider reinstitution of one or both of these medications in the outpatient setting but would need very close monitoring.  If the patient is not able to be closely monitored would prefer alternate medication such as hydralazine/nitrate.  Appreciate cardiology help  Hyponatremia/hypokalemia: K+ 3.5, Na+ 138.  Potassium and sodium have both normalized.  Monitor again outpatient  Hypocalcemia: 7.5 corrects to 8.1 when accounting for albumin.  No therapy necessary at this time but will continue to monitor outpatient.  Uncontrolled Diabetes Mellitus Type 2 with Hyperglycemia: Continue medical management per primary team   Recommendations conveyed to primary service.    [facility] 09/15/2019 10:05 AM  ADDENDUM: Discussed the case with cardiology who recommends he stay in the hospital due to persistent volume overload and difficulties with compliance.  This seems  reasonable.  Given his improved kidney function I will sign off at this time.  If further assistance is needed from nephrology in the future please let us know.  ___________________________________________________________  CC: AKI  Interval History/Subjective: Patient states that he is continue to feel better today.  He feels that his shortness of breath is improved.  His creatinine continues to downtrend down to 1.5.  The patient's urine output was 3.4 L in the last 24 hours.  He is transitioning to oral Lasix today.   Medications:  Current Facility-Administered Medications  Medication Dose Route Frequency Provider Last Rate Last Admin  . 0.9 %  sodium chloride infusion  250 mL Intravenous PRN Barton Dubois, MD      . acetaminophen (TYLENOL) tablet 650 mg  650 mg Oral Q4H PRN Barton Dubois, MD   650 mg at 09/13/19 2104  . albuterol (PROVENTIL) (2.5 MG/3ML) 0.083% nebulizer solution 2.5 mg  2.5 mg Nebulization Q6H PRN Barton Dubois, MD   2.5 mg at 09/12/19 1710  . apixaban (ELIQUIS) tablet 5 mg  5 mg Oral BID Bernerd Pho M, PA-C   5 mg at 09/15/19 0943  . aspirin EC tablet 81 mg  81 mg Oral Daily Barton Dubois, MD   81 mg at 09/15/19 0943  . atorvastatin (LIPITOR) tablet 80 mg  80 mg Oral q1800 Barton Dubois, MD   80 mg at 09/14/19 1734  . busPIRone (BUSPAR) tablet 5 mg  5 mg Oral BID Barton Dubois, MD   5 mg at 09/15/19 0943  . carvedilol (COREG) tablet 12.5 mg  12.5 mg Oral BID Barton Dubois, MD   12.5 mg at 09/15/19 0943  . citalopram (CELEXA) tablet 20 mg  20  mg Oral Daily Barton Dubois, MD   20 mg at 09/15/19 0943  . clotrimazole (LOTRIMIN) 1 % cream   Topical BID Reubin Milan, MD   Given at 09/15/19 [phone]  . diphenhydrAMINE-zinc acetate (BENADRYL) 3-7.1 % cream 1 application  1 application Topical TID PRN Reubin Milan, MD   1 application at 69/67/89 0048  . furosemide (LASIX) tablet 40 mg  40 mg Oral BID Reesa Chew, MD   40 mg at 09/15/19 3810  . insulin  aspart (novoLOG) injection 0-5 Units  0-5 Units Subcutaneous QHS Barton Dubois, MD   3 Units at 09/14/19 2055  . insulin aspart (novoLOG) injection 0-9 Units  0-9 Units Subcutaneous TID WC Barton Dubois, MD   3 Units at 09/15/19 [phone]  . morphine 2 MG/ML injection 2 mg  2 mg Intravenous Q4H PRN Darliss Cheney, MD   2 mg at 09/15/19 0947  . ondansetron (ZOFRAN) injection 4 mg  4 mg Intravenous Q6H PRN Barton Dubois, MD      . pantoprazole (PROTONIX) EC tablet 40 mg  40 mg Oral Daily Barton Dubois, MD   40 mg at 09/15/19 0943  . rOPINIRole (REQUIP) tablet 0.5 mg  0.5 mg Oral Q12H Barton Dubois, MD   0.5 mg at 09/15/19 0942  . sodium chloride flush (NS) 0.9 % injection 3 mL  3 mL Intravenous Q12H Barton Dubois, MD   3 mL at 09/15/19 0948  . sodium chloride flush (NS) 0.9 % injection 3 mL  3 mL Intravenous PRN Barton Dubois, MD          Review of Systems: 10 systems reviewed and negative except per interval history/subjective  Physical Exam: Vitals:   09/14/19 2051 09/15/19 0456  BP: (!) 130/104 (!) 131/86  Pulse: 85 90  Resp: 20   Temp: 98.2 F (36.8 C) 98.2 F (36.8 C)  SpO2: 100% 100%   No intake/output data recorded.  Intake/Output Summary (Last 24 hours) at 09/15/2019 1005 Last data filed at 09/15/2019 0400 Gross per 24 hour  Intake 480 ml  Output 2750 ml  Net -2270 ml   Constitutional: Chronically ill-appearing, no acute distress ENMT: ears and nose without scars or lesions, MMM CV: normal rate, 1+ pitting edema to the bilateral knee Respiratory: Clear to auscultation bilaterally, normal work of breathing Gastrointestinal: soft, non-tender, no palpable masses or hernias Skin: Scattered areas of inflamed hair follicles on the bilateral legs, otherwise no visible lesions or rashes Psych: alert, judgement/insight appropriate, appropriate mood and affect   Test Results I personally reviewed new and old clinical labs and radiology tests Lab Results  Component Value Date    NA 138 09/15/2019   K 3.5 09/15/2019   CL 99 09/15/2019   CO2 29 09/15/2019   BUN 49 (H) 09/15/2019   CREATININE 1.47 (H) 09/15/2019   CALCIUM 7.5 (L) 09/15/2019   ALBUMIN 3.3 (L) 09/14/2019

## 2019-09-15 NOTE — Plan of Care (Signed)

## 2019-09-15 NOTE — Discharge Summary (Signed)
Physician Discharge Summary  Shane Frye VVO:[id number] DOB: [birth year] DOA: 09/12/2019  PCP: Caryl Bis, MD  Admit date: 09/12/2019 Discharge date: 09/16/2019  Admitted From: Home Disposition: Left AGAINST MEDICAL ADVICE  Recommendations for Outpatient Follow-up:  1. Follow up with PCP in 1-2 weeks 2. Please obtain BMP/CBC in one week 3. Please follow up with your PCP on the following pending results: Unresulted Labs (From admission, onward) Comment         None       Home Health: none Equipment/Devices:none   Discharge Condition:fair  CODE STATUS:full code Diet recommendation: cardiac  Subjective: Patient was seen and examined earlier in the day. At that time he was complaining of shortness of breath but was not requiring oxygen. I had offered him to go home however he was very adamant and became angry on that plan and did not want to go home. Later on, cardiology also decided to keep him here for further IV diuresis as he still was 20 pounds up.  Brief/Interim Summary: Shane Frye a 53 y.o.malewith medical history significant ofCOPD, depression/anxiety, type 2 diabetes mellitus with nephropathy, gastroesophageal reflux disease, hypertension, hyperlipidemia, history of coronary artery disease and systolic heart failure; who presented to the hospital secondary to worsening shortness of breath (at rest and on exertion), and increased swelling. Patient reports couple recent admissions to [facility] for similar symptoms and despite intervention while hospitalized no significant improvement of his symptoms after discharge. Patient reports to be taking his medications as prescribed, but expressed having some diet indiscretion. He had noticed an increase in over 20 pounds of weight, lower extremity swelling significantly with blister development from fluid overload and also positive orthopnea.   Upon arrival to ED, chest x-ray demonstrating vascular congestion  along with cardiomegaly, BMP over 2000, on examination fluid overload has been appreciated and his blood work is demonstrating mild hyponatremia, metabolic acidosis and significant worsening of renal function with a creatinine up to 4.10.  Patient was admitted under hospitalist service.  Nephrology and cardiology was consulted.  Patient was diagnosed with acute on chronic systolic congestive heart failure as well as cardiorenal syndrome causing his acute on chronic renal failure.  He was started on IV Lasix.  His nephrotoxic agents including Entresto and Aldactone were held. Subsequently his creatinine improved up to 1.47. Nephrology switched him to oral Lasix however due to him still having 20 pound fluid excess, cardiology switched him back to IV Lasix and they recommended keeping him in the hospital for couple of days for further IV diuresis. This was communicated to the patient very well however later in the day on 09/15/2019 around 6 PM, patient called his sons, was dressed up and called the nurses to just inform them that he is leaving. He then left AGAINST MEDICAL ADVICE.  Discharge Diagnoses:  Principal Problem:   Acute on chronic systolic CHF (congestive heart failure) (HCC) Active Problems:   HLD (hyperlipidemia)   Obesity   GERD (gastroesophageal reflux disease)   Acute on chronic renal failure (HCC)    Discharge Instructions   Allergies as of 09/15/2019   No Known Allergies     Medication List    STOP taking these medications   Entresto 49-51 MG Generic drug: sacubitril-valsartan   furosemide 20 MG tablet Commonly known as: LASIX   metFORMIN 1000 MG tablet Commonly known as: GLUCOPHAGE   metoprolol succinate 50 MG 24 hr tablet Commonly known as: TOPROL-XL   spironolactone 25 MG tablet Commonly known  as: Aldactone     TAKE these medications   albuterol 108 (90 Base) MCG/ACT inhaler Commonly known as: VENTOLIN HFA Inhale 2 puffs into the lungs every 6 (six) hours as  needed for wheezing or shortness of breath.   aspirin 81 MG tablet Take 81 mg by mouth daily.   atorvastatin 80 MG tablet Commonly known as: LIPITOR Take 1 tablet (80 mg total) by mouth daily at 6 PM.   carvedilol 6.25 MG tablet Commonly known as: COREG Take 2 tablets (12.5 mg total) by mouth 2 (two) times daily.   citalopram 20 MG tablet Commonly known as: CELEXA Take 1 tablet (20 mg total) by mouth daily.   Eliquis 5 MG Tabs tablet Generic drug: apixaban Take 5 mg by mouth 2 (two) times daily.   insulin aspart protamine- aspart (70-30) 100 UNIT/ML injection Commonly known as: NOVOLOG MIX 70/30 Inject 0.15-0.3 mLs (15-30 Units total) into the skin 3 (three) times daily. What changed: how much to take   pantoprazole 40 MG tablet Commonly known as: PROTONIX Take 1 tablet (40 mg total) by mouth daily.   rOPINIRole 0.5 MG tablet Commonly known as: REQUIP Take 1-2 tablets (0.5-1 mg total) by mouth at bedtime.   temazepam 15 MG capsule Commonly known as: RESTORIL Take 15 mg by mouth at bedtime as needed for sleep.       No Known Allergies  Consultations: Cardiology and nephrology   Procedures/Studies: US RENAL  Result Date: 09/12/2019 CLINICAL DATA:  Acute renal failure. EXAM: RENAL / URINARY TRACT ULTRASOUND COMPLETE COMPARISON:  None. FINDINGS: Right Kidney: Renal measurements: 12.0 x 7.0 x 6.8 cm = volume: 300 mL . Echogenicity within normal limits. No mass or hydronephrosis visualized. Left Kidney: Renal measurements: 12.9 x 6.9 x 6.4 cm = volume: 296 mL. Echogenicity within normal limits. No mass or hydronephrosis visualized. Bladder: Appears normal for degree of bladder distention. Ureteral jets are not visualized. Other: None. IMPRESSION: No significant renal abnormality is noted. Electronically Signed   By: Marijo Conception M.D.   On: 09/12/2019 08:34   DG Chest Port 1 View  Result Date: 09/12/2019 CLINICAL DATA:  Shortness of breath and increased fluid EXAM:  PORTABLE CHEST 1 VIEW COMPARISON:  06/16/2019 FINDINGS: Cardiomegaly. Prior CABG. Vascular congestion without Kerley lines or effusion. No consolidation or pneumothorax. IMPRESSION: Chronic cardiomegaly with mild vascular congestion. Electronically Signed   By: Monte Fantasia M.D.   On: 09/12/2019 05:53   ECHOCARDIOGRAM COMPLETE  Result Date: 09/12/2019    ECHOCARDIOGRAM REPORT   Patient Name:   Shane Frye Date of Exam: 09/12/2019 Medical Rec #:  [id number]      Height:       69.0 in Accession #:    [id number]     Weight:       266.0 lb Date of Birth:  1965/09/30       BSA:          2.332 m Patient Age:    86 years       BP:           117/95 mmHg Patient Gender: M              HR:           82 bpm. Exam Location:  [facility] Procedure: 2D Echo, Cardiac Doppler and Color Doppler Indications:    CHF-Acute Systolic 008.67 / Y19.50  History:        Patient has prior history of Echocardiogram examinations, most  recent 07/13/2019. CHF, CAD and Previous Myocardial Infarction,                 Prior CABG, Arrythmias:Atrial Fibrillation; Risk                 Factors:Hypertension, Diabetes, Dyslipidemia and Current Smoker.                 OSA (obstructive sleep apnea), h/o Cocaine abuse, Obesity.  Sonographer:    Alvino Chapel RCS Referring Phys: [place]  1. Left ventricular ejection fraction, by estimation, is approximately 20%. The left ventricle has severely decreased function. The left ventricle demonstrates global hypokinesis with some regional variation. The left ventricular internal cavity size was mildly dilated. There is mild left ventricular hypertrophy. Left ventricular diastolic parameters are indeterminate in the setting of possible atrial flutter.  2. No LV mural thrombus with Definity contrast.  3. Right ventricular systolic function is severely reduced. The right ventricular size is mildly enlarged. There is mildly elevated pulmonary artery systolic pressure. The  estimated right ventricular systolic pressure is 25.9 mmHg.  4. Left atrial size was severely dilated.  5. Right atrial size was moderately dilated.  6. The mitral valve is grossly normal. Mild mitral valve regurgitation.  7. The aortic valve is tricuspid. Aortic valve regurgitation is not visualized.  8. The inferior vena cava is dilated in size with <50% respiratory variability, suggesting right atrial pressure of 15 mmHg. FINDINGS  Left Ventricle: Left ventricular ejection fraction, by estimation, is 20%. The left ventricle has severely decreased function. The left ventricle demonstrates global hypokinesis. Definity contrast agent was given IV to delineate the left ventricular endocardial borders. The left ventricular internal cavity size was mildly dilated. There is mild left ventricular hypertrophy. Left ventricular diastolic parameters are indeterminate. Right Ventricle: The right ventricular size is mildly enlarged. No increase in right ventricular wall thickness. Right ventricular systolic function is severely reduced. There is mildly elevated pulmonary artery systolic pressure. The tricuspid regurgitant velocity is 2.39 m/s, and with an assumed right atrial pressure of 15 mmHg, the estimated right ventricular systolic pressure is 56.3 mmHg. Left Atrium: Left atrial size was severely dilated. Right Atrium: Right atrial size was moderately dilated. Pericardium: There is no evidence of pericardial effusion. Mitral Valve: The mitral valve is grossly normal. There is mild thickening of the mitral valve leaflet(s). Mild mitral valve regurgitation. Tricuspid Valve: The tricuspid valve is grossly normal. Tricuspid valve regurgitation is mild. Aortic Valve: The aortic valve is tricuspid. Aortic valve regurgitation is not visualized. Mild to moderate aortic valve annular calcification. Pulmonic Valve: The pulmonic valve was grossly normal. Pulmonic valve regurgitation is trivial. Aorta: The aortic root is normal in  size and structure. Venous: The inferior vena cava is dilated in size with less than 50% respiratory variability, suggesting right atrial pressure of 15 mmHg. IAS/Shunts: No atrial level shunt detected by color flow Doppler.  LEFT VENTRICLE PLAX 2D LVIDd:         6.04 cm LVIDs:         5.43 cm LV PW:         1.08 cm LV IVS:        1.23 cm LVOT diam:     2.00 cm LV SV:         32 LV SV Index:   14 LVOT Area:     3.14 cm  LV Volumes (MOD) LV vol d, MOD A2C: 158.0 ml LV vol d, MOD A4C: 170.0  ml LV vol s, MOD A2C: 114.0 ml LV vol s, MOD A4C: 132.0 ml LV SV MOD A2C:     44.0 ml LV SV MOD A4C:     170.0 ml LV SV MOD BP:      42.2 ml LEFT ATRIUM              Index       RIGHT ATRIUM           Index LA diam:        5.50 cm  2.36 cm/m  RA Area:     26.50 cm LA Vol (A2C):   117.0 ml 50.17 ml/m RA Volume:   96.50 ml  41.38 ml/m LA Vol (A4C):   119.0 ml 51.02 ml/m LA Biplane Vol: 125.0 ml 53.60 ml/m  AORTIC VALVE LVOT Vmax:   61.50 cm/s LVOT Vmean:  41.800 cm/s LVOT VTI:    0.102 m  AORTA Ao Root diam: 3.60 cm MITRAL VALVE               TRICUSPID VALVE MV Area (PHT): 6.71 cm    TR Peak grad:   22.8 mmHg MV Decel Time: 113 msec    TR Vmax:        239.00 cm/s MV E velocity: 78.30 cm/s                            SHUNTS                            Systemic VTI:  0.10 m                            Systemic Diam: 2.00 cm Rozann Lesches MD Electronically signed by Rozann Lesches MD Signature Date/Time: 09/12/2019/2:10:13 PM    Final      Discharge Exam: Vitals:   09/15/19 0456 09/15/19 1418  BP: (!) 131/86 (!) 89/63  Pulse: 90 87  Resp:  19  Temp: 98.2 F (36.8 C) (!) 97.5 F (36.4 C)  SpO2: 100% 94%   Vitals:   09/14/19 2051 09/15/19 0433 09/15/19 0456 09/15/19 1418  BP: (!) 130/104  (!) 131/86 (!) 89/63  Pulse: 85  90 87  Resp: 20   19  Temp: 98.2 F (36.8 C)  98.2 F (36.8 C) (!) 97.5 F (36.4 C)  TempSrc:      SpO2: 100%  100% 94%  Weight:  (!) 119.7 kg    Height:        General: Pt is alert,  awake, not in acute distress Cardiovascular: RRR, S1/S2 +, no rubs, no gallops Respiratory: CTA bilaterally, no wheezing, no rhonchi Abdominal: Soft, NT, ND, bowel sounds + Extremities: +2 pitting edema bilateral lower extremity, no cyanosis    The results of significant diagnostics from this hospitalization (including imaging, microbiology, ancillary and laboratory) are listed below for reference.     Microbiology: Recent Results (from the past 240 hour(s))  SARS Coronavirus 2 by RT PCR (hospital order, performed in [facility] hospital lab) Nasopharyngeal Nasopharyngeal Swab     Status: None   Collection Time: 09/12/19  8:07 AM   Specimen: Nasopharyngeal Swab  Result Value Ref Range Status   SARS Coronavirus 2 NEGATIVE NEGATIVE Final    Comment: (NOTE) SARS-CoV-2 target nucleic acids are NOT DETECTED.  The SARS-CoV-2 RNA is generally detectable in upper  and lower respiratory specimens during the acute phase of infection. The lowest concentration of SARS-CoV-2 viral copies this assay can detect is 250 copies / mL. A negative result does not preclude SARS-CoV-2 infection and should not be used as the sole basis for treatment or other patient management decisions.  A negative result may occur with improper specimen collection / handling, submission of specimen other than nasopharyngeal swab, presence of viral mutation(s) within the areas targeted by this assay, and inadequate number of viral copies (<250 copies / mL). A negative result must be combined with clinical observations, patient history, and epidemiological information.  Fact Sheet for Patients:   StrictlyIdeas.no  Fact Sheet for Healthcare Providers: BankingDealers.co.za  This test is not yet approved or  cleared by the [place] FDA and has been authorized for detection and/or diagnosis of SARS-CoV-2 by FDA under an Emergency Use Authorization (EUA).  This EUA will  remain in effect (meaning this test can be used) for the duration of the COVID-19 declaration under Section 564(b)(1) of the Act, 21 U.S.C. section 360bbb-3(b)(1), unless the authorization is terminated or revoked sooner.  Performed at [facility], [address] [place], [place] [address]      Labs: BNP (last 3 results) Recent Labs    06/16/19 1840 09/12/19 0512  BNP 845.0* 8,850.2*   Basic Metabolic Panel: Recent Labs  Lab 09/12/19 0512 09/12/19 0729 09/13/19 0558 09/14/19 0554 09/15/19 0434  NA 130*  --  132* 136 138  K 4.0  --  3.5 3.2* 3.5  CL 94*  --  97* 97* 99  CO2 18*  --  23 27 29   GLUCOSE 180*  --  219* 239* 214*  BUN 90*  --  90* 66* 49*  CREATININE 4.10*  --  2.91* 1.86* 1.47*  CALCIUM 8.1*  --  7.6* 7.6* 7.5*  MG  --  1.9  --   --   --    Liver Function Tests: Recent Labs  Lab 09/14/19 0554  ALBUMIN 3.3*   No results for input(s): LIPASE, AMYLASE in the last 168 hours. No results for input(s): AMMONIA in the last 168 hours. CBC: Recent Labs  Lab 09/12/19 0512  WBC 12.7*  NEUTROABS 8.6*  HGB 12.8*  HCT 40.0  MCV 79.8*  PLT 392   Cardiac Enzymes: No results for input(s): CKTOTAL, CKMB, CKMBINDEX, TROPONINI in the last 168 hours. BNP: Invalid input(s): POCBNP CBG: Recent Labs  Lab 09/14/19 1709 09/14/19 2052 09/15/19 0741 09/15/19 1116 09/15/19 1658  GLUCAP 268* 267* 233* 262* 180*   D-Dimer No results for input(s): DDIMER in the last 72 hours. Hgb A1c No results for input(s): HGBA1C in the last 72 hours. Lipid Profile No results for input(s): CHOL, HDL, LDLCALC, TRIG, CHOLHDL, LDLDIRECT in the last 72 hours. Thyroid function studies No results for input(s): TSH, T4TOTAL, T3FREE, THYROIDAB in the last 72 hours.  Invalid input(s): FREET3 Anemia work up No results for input(s): VITAMINB12, FOLATE, FERRITIN, TIBC, IRON, RETICCTPCT in the last 72 hours. Urinalysis    Component Value Date/Time   COLORURINE YELLOW 09/12/2019  0807   APPEARANCEUR HAZY (A) 09/12/2019 0807   LABSPEC 1.015 09/12/2019 0807   PHURINE 5.0 09/12/2019 0807   GLUCOSEU NEGATIVE 09/12/2019 0807   HGBUR NEGATIVE 09/12/2019 0807   BILIRUBINUR NEGATIVE 09/12/2019 0807   KETONESUR NEGATIVE 09/12/2019 0807   PROTEINUR 30 (A) 09/12/2019 0807   UROBILINOGEN 1.0 11/08/2013 1549   NITRITE NEGATIVE 09/12/2019 0807   LEUKOCYTESUR NEGATIVE 09/12/2019 7741  Sepsis Labs Invalid input(s): PROCALCITONIN,  WBC,  LACTICIDVEN Microbiology Recent Results (from the past 240 hour(s))  SARS Coronavirus 2 by RT PCR (hospital order, performed in [facility] hospital lab) Nasopharyngeal Nasopharyngeal Swab     Status: None   Collection Time: 09/12/19  8:07 AM   Specimen: Nasopharyngeal Swab  Result Value Ref Range Status   SARS Coronavirus 2 NEGATIVE NEGATIVE Final    Comment: (NOTE) SARS-CoV-2 target nucleic acids are NOT DETECTED.  The SARS-CoV-2 RNA is generally detectable in upper and lower respiratory specimens during the acute phase of infection. The lowest concentration of SARS-CoV-2 viral copies this assay can detect is 250 copies / mL. A negative result does not preclude SARS-CoV-2 infection and should not be used as the sole basis for treatment or other patient management decisions.  A negative result may occur with improper specimen collection / handling, submission of specimen other than nasopharyngeal swab, presence of viral mutation(s) within the areas targeted by this assay, and inadequate number of viral copies (<250 copies / mL). A negative result must be combined with clinical observations, patient history, and epidemiological information.  Fact Sheet for Patients:   StrictlyIdeas.no  Fact Sheet for Healthcare Providers: BankingDealers.co.za  This test is not yet approved or  cleared by the [place] FDA and has been authorized for detection and/or diagnosis of SARS-CoV-2 by FDA  under an Emergency Use Authorization (EUA).  This EUA will remain in effect (meaning this test can be used) for the duration of the COVID-19 declaration under Section 564(b)(1) of the Act, 21 U.S.C. section 360bbb-3(b)(1), unless the authorization is terminated or revoked sooner.  Performed at [facility], [address] [place], [place] [address]      Time coordinating discharge: Over 30 minutes  SIGNED:   Darliss Cheney, MD  [facility] 09/16/2019, 2:57 PM  If 7PM-7AM, please contact night-coverage [email or web address]

## 2019-09-15 NOTE — Progress Notes (Signed)
    Discussed with Nephrology. Given that the patient is still 20+ pounds above his baseline weight and issues with noncompliance in the past, will continue with IV Lasix for now. He did receive PO Lasix this morning so will restart IV Lasix 60mg  BID this evening. Repeat BMET in AM.   Signed, Erma Heritage, PA-C 09/15/2019, 1:25 PM Pager: [phone]

## 2019-09-15 NOTE — Progress Notes (Signed)
PROGRESS NOTE    Shane Frye  CHE:[id number] DOB: 08/03/1965 DOA: 09/12/2019 PCP: Caryl Bis, MD   Chief Complaint  Patient presents with  . Shortness of Breath    Brief Narrative:  Shane Frye is a 54 y.o. male with medical history significant of COPD, depression/anxiety, type 2 diabetes mellitus with nephropathy, gastroesophageal reflux disease, hypertension, hyperlipidemia, history of coronary artery disease and systolic heart failure; who presented to the hospital secondary to worsening shortness of breath (at rest and on exertion), and increased swelling.  Patient reports couple recent admissions to [facility] for similar symptoms and despite intervention while hospitalized no significant improvement of his symptoms after discharge.  Patient reports to be taking his medications as prescribed, but expressed having some diet indiscretion.  He has noticed an increase in over 20 pounds of weight, lower extremity swelling significantly with blister development from fluid overload and also positive orthopnea.    Upon arrival to ED, chest x-ray demonstrating vascular congestion along with cardiomegaly, BMP over 2000, on examination fluid overload has been appreciated and his blood work is demonstrating mild hyponatremia, metabolic acidosis and significant worsening of renal function with a creatinine up to 4.10.  Patient was admitted under hospitalist service.  Nephrology and cardiology was consulted.  Patient was diagnosed with acute on chronic systolic congestive heart failure as well as cardiorenal syndrome causing his acute on chronic renal failure.  He was started on IV Lasix.  His nephrotoxic agents including Entresto and Aldactone were held.   Assessment & Plan: 1-Acute on chronic systolic CHF (congestive heart failure) (HCC) -Renal function has improved after holding nephrotoxic agents and now very close to his baseline.  Currently 1.47 of creatinine.  Nephrology  switched him to oral Lasix however cardiology's note indicated to continue IV Lasix.  Will defer to cardiology and nephrology.  Continue Coreg at current dose. -Daily weight, low-sodium diet, strict I's and O's and limited fluid intake, once again encouraged.  Patient now complains of shortness of breath but not requiring any oxygen.  He still has lower extremity edema.  2-acute on chronic renal failure/hyponatremia metabolic acidosis -Stage II-IIIa (transitioning stage of baseline). -Appears to be secondary to prerenal azotemia and continue use of nephrotoxic agents. -Switched to oral Lasix by nephrology but cardiology recommends IV Lasix.  Will defer to both of these specialties to manage this.  He has been started on [facility] by cardiology.  They prefer to keep him in the hospital and continue diuretics for another few days. -Avoid hypotension. -Creatinine down to 1.47; peaked at 4.10 on presentation -sodium level up to 138 currently.  3-HLD (hyperlipidemia) -Continue statins.  4-morbid obesity -Body mass index is 38.99 kg/m.  -Low calorie diet, portion control increase physical activity discussed with patient.  5-gastroesophageal reflux disease -Continue PPI  6-type 2 diabetes with nephropathy -Continue holding oral hypoglycemic agents -Blood sugar elevated.  Start on Lantus 15 units and continue SSI. -A1c 9.2  7-hx of atrial flutter -Continue beta-blocker for rate control -Patient started on Eliquis. -stable currently  8-chronic resp failure with hypoxia due to COPD -No wheezing: -Continue bronchodilator management and follow breathing status. -Per family reports he uses oxygen at home intermittently; will follow O2 sats and resume the use of 2 L nasal cannula as needed.  Currently is on room air and saturating well over 90%.  9-HLD -continue statins  10-RLS -continue requip.  11: Bilateral lower extremity wounds.  Wound care on board.   DVT prophylaxis:  eliquis. Code Status: Full code Family Communication: No family at bedside; discussed in length with patient.  He verbalized understanding. Disposition:   Status is: Inpatient  Dispo: The patient is from: Home              Anticipated d/c is to: Home              Anticipated d/c date is: 09/16/2019              Patient currently no medically ready for discharge; needs continued IV Lasix per cardiology recommendation.       Consultants:   Cardiology service  Nephrology service   Procedures:  See below for x-ray reports.   2D echo: Pending   Antimicrobials:  None   Subjective: Seen and examined.  Complains of shortness of breath and demands of oxygen.  I asked him why he thinks he needs oxygen and his response was " because I say so".  I explained to him that his oxygen saturations were over 90% and thus he does not require oxygen.  He did not like what I said.  Objective: Vitals:   09/14/19 1423 09/14/19 2051 09/15/19 0433 09/15/19 0456  BP: 100/74 (!) 130/104  (!) 131/86  Pulse: 83 85  90  Resp: 20 20    Temp: 98.2 F (36.8 C) 98.2 F (36.8 C)  98.2 F (36.8 C)  TempSrc:      SpO2: 94% 100%  100%  Weight:   (!) 119.7 kg   Height:        Intake/Output Summary (Last 24 hours) at 09/15/2019 1151 Last data filed at 09/15/2019 0400 Gross per 24 hour  Intake 480 ml  Output 1750 ml  Net -1270 ml   Filed Weights   09/13/19 0529 09/14/19 0526 09/15/19 0433  Weight: (!) 124 kg (!) 123.6 kg (!) 119.7 kg    Examination:  General exam: Appears calm and comfortable  Respiratory system: Clear to auscultation. Respiratory effort normal. Cardiovascular system: S1 & S2 heard, RRR. No JVD, murmurs, rubs, gallops or clicks.  +2 pitting edema bilateral lower extremity Gastrointestinal system: Abdomen is nondistended, soft and nontender. No organomegaly or masses felt. Normal bowel sounds heard. Central nervous system: Alert and oriented. No focal neurological  deficits. Extremities: Symmetric 5 x 5 power. Skin: No rashes, lesions or ulcers.  Psychiatry: Judgement and insight appear normal. Mood & affect appropriate.   Data Reviewed: I have personally reviewed following labs and imaging studies  CBC: Recent Labs  Lab 09/12/19 0512  WBC 12.7*  NEUTROABS 8.6*  HGB 12.8*  HCT 40.0  MCV 79.8*  PLT 174    Basic Metabolic Panel: Recent Labs  Lab 09/12/19 0512 09/12/19 0729 09/13/19 0558 09/14/19 0554 09/15/19 0434  NA 130*  --  132* 136 138  K 4.0  --  3.5 3.2* 3.5  CL 94*  --  97* 97* 99  CO2 18*  --  23 27 29   GLUCOSE 180*  --  219* 239* 214*  BUN 90*  --  90* 66* 49*  CREATININE 4.10*  --  2.91* 1.86* 1.47*  CALCIUM 8.1*  --  7.6* 7.6* 7.5*  MG  --  1.9  --   --   --     GFR: Estimated Creatinine Clearance: 71.7 mL/min (A) (by C-G formula based on SCr of 1.47 mg/dL (H)).  CBG: Recent Labs  Lab 09/14/19 1157 09/14/19 1709 09/14/19 2052 09/15/19 0741 09/15/19 1116  GLUCAP 213* 268* 267*  233* 262*     Recent Results (from the past 240 hour(s))  SARS Coronavirus 2 by RT PCR (hospital order, performed in [facility] hospital lab) Nasopharyngeal Nasopharyngeal Swab     Status: None   Collection Time: 09/12/19  8:07 AM   Specimen: Nasopharyngeal Swab  Result Value Ref Range Status   SARS Coronavirus 2 NEGATIVE NEGATIVE Final    Comment: (NOTE) SARS-CoV-2 target nucleic acids are NOT DETECTED.  The SARS-CoV-2 RNA is generally detectable in upper and lower respiratory specimens during the acute phase of infection. The lowest concentration of SARS-CoV-2 viral copies this assay can detect is 250 copies / mL. A negative result does not preclude SARS-CoV-2 infection and should not be used as the sole basis for treatment or other patient management decisions.  A negative result may occur with improper specimen collection / handling, submission of specimen other than nasopharyngeal swab, presence of viral mutation(s)  within the areas targeted by this assay, and inadequate number of viral copies (<250 copies / mL). A negative result must be combined with clinical observations, patient history, and epidemiological information.  Fact Sheet for Patients:   StrictlyIdeas.no  Fact Sheet for Healthcare Providers: BankingDealers.co.za  This test is not yet approved or  cleared by the [place] FDA and has been authorized for detection and/or diagnosis of SARS-CoV-2 by FDA under an Emergency Use Authorization (EUA).  This EUA will remain in effect (meaning this test can be used) for the duration of the COVID-19 declaration under Section 564(b)(1) of the Act, 21 U.S.C. section 360bbb-3(b)(1), unless the authorization is terminated or revoked sooner.  Performed at [facility], [address], [place], [place] [address]      Radiology Studies: No results found.  Scheduled Meds: . apixaban  5 mg Oral BID  . aspirin EC  81 mg Oral Daily  . atorvastatin  80 mg Oral q1800  . busPIRone  5 mg Oral BID  . carvedilol  12.5 mg Oral BID  . citalopram  20 mg Oral Daily  . clotrimazole   Topical BID  . furosemide  40 mg Oral BID  . insulin aspart  0-5 Units Subcutaneous QHS  . insulin aspart  0-9 Units Subcutaneous TID WC  . pantoprazole  40 mg Oral Daily  . rOPINIRole  0.5 mg Oral Q12H  . sacubitril-valsartan  1 tablet Oral BID  . sodium chloride flush  3 mL Intravenous Q12H   Continuous Infusions: . sodium chloride       LOS: 3 days    Time spent: 30 minutes.  Darliss Cheney, MD [facility]   To contact the attending provider between 7A-7P or the covering provider during after hours 7P-7A, please log into the web site [email or web address] and access using universal [place] password for that web site. If you do not have the password, please call the hospital operator.  09/15/2019, 11:51 AM

## 2019-09-27 DIAGNOSIS — I499 Cardiac arrhythmia, unspecified: Secondary | ICD-10-CM | POA: Diagnosis not present

## 2019-09-27 DIAGNOSIS — J9601 Acute respiratory failure with hypoxia: Secondary | ICD-10-CM | POA: Diagnosis not present

## 2019-09-27 DIAGNOSIS — J449 Chronic obstructive pulmonary disease, unspecified: Secondary | ICD-10-CM | POA: Diagnosis not present

## 2019-09-27 DIAGNOSIS — I5042 Chronic combined systolic (congestive) and diastolic (congestive) heart failure: Secondary | ICD-10-CM | POA: Diagnosis not present

## 2019-09-27 DIAGNOSIS — E1165 Type 2 diabetes mellitus with hyperglycemia: Secondary | ICD-10-CM | POA: Diagnosis not present

## 2019-09-27 DIAGNOSIS — R079 Chest pain, unspecified: Secondary | ICD-10-CM | POA: Diagnosis not present

## 2019-09-27 DIAGNOSIS — I10 Essential (primary) hypertension: Secondary | ICD-10-CM | POA: Diagnosis not present

## 2019-09-27 DIAGNOSIS — I2510 Atherosclerotic heart disease of native coronary artery without angina pectoris: Secondary | ICD-10-CM | POA: Diagnosis not present

## 2019-09-27 DIAGNOSIS — I472 Ventricular tachycardia: Secondary | ICD-10-CM | POA: Diagnosis not present

## 2019-09-27 DIAGNOSIS — J9690 Respiratory failure, unspecified, unspecified whether with hypoxia or hypercapnia: Secondary | ICD-10-CM | POA: Diagnosis not present

## 2019-09-27 DIAGNOSIS — E785 Hyperlipidemia, unspecified: Secondary | ICD-10-CM | POA: Diagnosis not present

## 2019-09-27 DIAGNOSIS — R5381 Other malaise: Secondary | ICD-10-CM | POA: Diagnosis not present

## 2019-09-27 DIAGNOSIS — R69 Illness, unspecified: Secondary | ICD-10-CM | POA: Diagnosis not present

## 2019-09-27 DIAGNOSIS — Z743 Need for continuous supervision: Secondary | ICD-10-CM | POA: Diagnosis not present

## 2019-09-27 DIAGNOSIS — N189 Chronic kidney disease, unspecified: Secondary | ICD-10-CM | POA: Diagnosis not present

## 2019-09-27 DIAGNOSIS — Z87891 Personal history of nicotine dependence: Secondary | ICD-10-CM | POA: Diagnosis not present

## 2019-09-27 DIAGNOSIS — I4891 Unspecified atrial fibrillation: Secondary | ICD-10-CM | POA: Diagnosis not present

## 2019-09-27 DIAGNOSIS — I517 Cardiomegaly: Secondary | ICD-10-CM | POA: Diagnosis not present

## 2019-09-27 DIAGNOSIS — E119 Type 2 diabetes mellitus without complications: Secondary | ICD-10-CM | POA: Diagnosis not present

## 2019-09-27 DIAGNOSIS — R0902 Hypoxemia: Secondary | ICD-10-CM | POA: Diagnosis not present

## 2019-09-27 DIAGNOSIS — Z794 Long term (current) use of insulin: Secondary | ICD-10-CM | POA: Diagnosis not present

## 2019-09-27 DIAGNOSIS — I480 Paroxysmal atrial fibrillation: Secondary | ICD-10-CM | POA: Diagnosis not present

## 2019-09-27 DIAGNOSIS — Z20822 Contact with and (suspected) exposure to covid-19: Secondary | ICD-10-CM | POA: Diagnosis not present

## 2019-09-27 DIAGNOSIS — I5043 Acute on chronic combined systolic (congestive) and diastolic (congestive) heart failure: Secondary | ICD-10-CM | POA: Diagnosis not present

## 2019-09-27 DIAGNOSIS — R0689 Other abnormalities of breathing: Secondary | ICD-10-CM | POA: Diagnosis not present

## 2019-09-27 DIAGNOSIS — I110 Hypertensive heart disease with heart failure: Secondary | ICD-10-CM | POA: Diagnosis not present

## 2019-09-27 DIAGNOSIS — Z9119 Patient's noncompliance with other medical treatment and regimen: Secondary | ICD-10-CM | POA: Diagnosis not present

## 2019-09-27 DIAGNOSIS — F1410 Cocaine abuse, uncomplicated: Secondary | ICD-10-CM | POA: Diagnosis not present

## 2019-09-27 DIAGNOSIS — R55 Syncope and collapse: Secondary | ICD-10-CM | POA: Diagnosis not present

## 2019-09-27 DIAGNOSIS — I255 Ischemic cardiomyopathy: Secondary | ICD-10-CM | POA: Diagnosis not present

## 2019-09-27 DIAGNOSIS — E876 Hypokalemia: Secondary | ICD-10-CM | POA: Diagnosis not present

## 2019-09-27 DIAGNOSIS — R0602 Shortness of breath: Secondary | ICD-10-CM | POA: Diagnosis not present

## 2019-09-27 DIAGNOSIS — I5022 Chronic systolic (congestive) heart failure: Secondary | ICD-10-CM | POA: Diagnosis not present

## 2019-09-27 DIAGNOSIS — N179 Acute kidney failure, unspecified: Secondary | ICD-10-CM | POA: Diagnosis not present

## 2019-09-27 DIAGNOSIS — I214 Non-ST elevation (NSTEMI) myocardial infarction: Secondary | ICD-10-CM | POA: Diagnosis not present

## 2019-09-27 DIAGNOSIS — Z8659 Personal history of other mental and behavioral disorders: Secondary | ICD-10-CM | POA: Diagnosis not present

## 2019-09-27 DIAGNOSIS — G4733 Obstructive sleep apnea (adult) (pediatric): Secondary | ICD-10-CM | POA: Diagnosis not present

## 2019-09-27 DIAGNOSIS — K219 Gastro-esophageal reflux disease without esophagitis: Secondary | ICD-10-CM | POA: Diagnosis not present

## 2019-09-27 DIAGNOSIS — E1069 Type 1 diabetes mellitus with other specified complication: Secondary | ICD-10-CM | POA: Diagnosis not present

## 2019-10-05 DIAGNOSIS — I480 Paroxysmal atrial fibrillation: Secondary | ICD-10-CM | POA: Diagnosis not present

## 2019-10-05 DIAGNOSIS — J9601 Acute respiratory failure with hypoxia: Secondary | ICD-10-CM | POA: Diagnosis not present

## 2019-10-05 DIAGNOSIS — I482 Chronic atrial fibrillation, unspecified: Secondary | ICD-10-CM | POA: Diagnosis not present

## 2019-10-05 DIAGNOSIS — K219 Gastro-esophageal reflux disease without esophagitis: Secondary | ICD-10-CM | POA: Diagnosis not present

## 2019-10-05 DIAGNOSIS — N189 Chronic kidney disease, unspecified: Secondary | ICD-10-CM | POA: Diagnosis not present

## 2019-10-05 DIAGNOSIS — E785 Hyperlipidemia, unspecified: Secondary | ICD-10-CM | POA: Diagnosis not present

## 2019-10-05 DIAGNOSIS — R69 Illness, unspecified: Secondary | ICD-10-CM | POA: Diagnosis not present

## 2019-10-05 DIAGNOSIS — N179 Acute kidney failure, unspecified: Secondary | ICD-10-CM | POA: Diagnosis not present

## 2019-10-05 DIAGNOSIS — Z743 Need for continuous supervision: Secondary | ICD-10-CM | POA: Diagnosis not present

## 2019-10-05 DIAGNOSIS — I2510 Atherosclerotic heart disease of native coronary artery without angina pectoris: Secondary | ICD-10-CM | POA: Diagnosis not present

## 2019-10-05 DIAGNOSIS — G4733 Obstructive sleep apnea (adult) (pediatric): Secondary | ICD-10-CM | POA: Diagnosis not present

## 2019-10-05 DIAGNOSIS — I472 Ventricular tachycardia: Secondary | ICD-10-CM | POA: Diagnosis not present

## 2019-10-05 DIAGNOSIS — I5042 Chronic combined systolic (congestive) and diastolic (congestive) heart failure: Secondary | ICD-10-CM | POA: Diagnosis not present

## 2019-10-05 DIAGNOSIS — I10 Essential (primary) hypertension: Secondary | ICD-10-CM | POA: Diagnosis not present

## 2019-10-05 DIAGNOSIS — R5381 Other malaise: Secondary | ICD-10-CM | POA: Diagnosis not present

## 2019-10-05 DIAGNOSIS — E1069 Type 1 diabetes mellitus with other specified complication: Secondary | ICD-10-CM | POA: Diagnosis not present

## 2019-10-07 DIAGNOSIS — G4733 Obstructive sleep apnea (adult) (pediatric): Secondary | ICD-10-CM | POA: Diagnosis not present

## 2019-10-07 DIAGNOSIS — E785 Hyperlipidemia, unspecified: Secondary | ICD-10-CM | POA: Diagnosis not present

## 2019-10-07 DIAGNOSIS — K219 Gastro-esophageal reflux disease without esophagitis: Secondary | ICD-10-CM | POA: Diagnosis not present

## 2019-10-07 DIAGNOSIS — I472 Ventricular tachycardia: Secondary | ICD-10-CM | POA: Diagnosis not present

## 2019-10-07 DIAGNOSIS — I482 Chronic atrial fibrillation, unspecified: Secondary | ICD-10-CM | POA: Diagnosis not present

## 2019-10-07 DIAGNOSIS — N189 Chronic kidney disease, unspecified: Secondary | ICD-10-CM | POA: Diagnosis not present

## 2020-02-18 DEATH — deceased

## 2020-08-15 IMAGING — US US RENAL
1 series · 14 of 25 positions shown · non-contrast
Comparison: None.

CLINICAL DATA: Acute renal failure.

EXAM:
RENAL / URINARY TRACT ULTRASOUND COMPLETE

[Series 1: us renal · 14 of 49 slices shown]
[im 1/49]
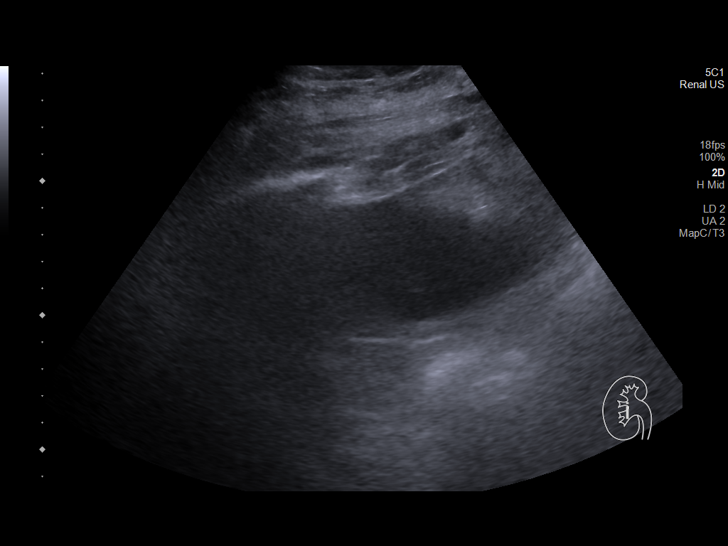
[im 5/49]
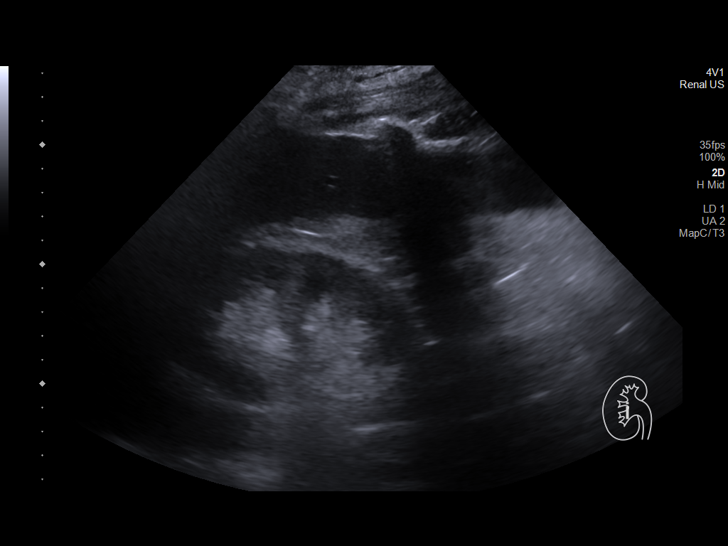
[im 9/49]
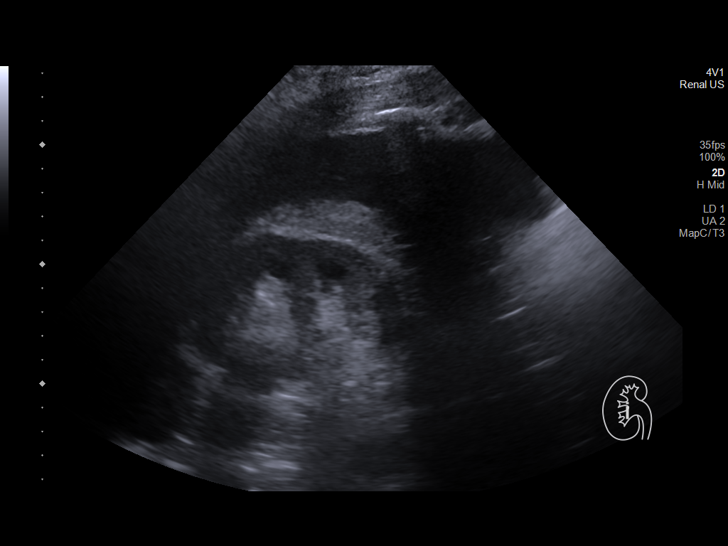
[im 13/49]
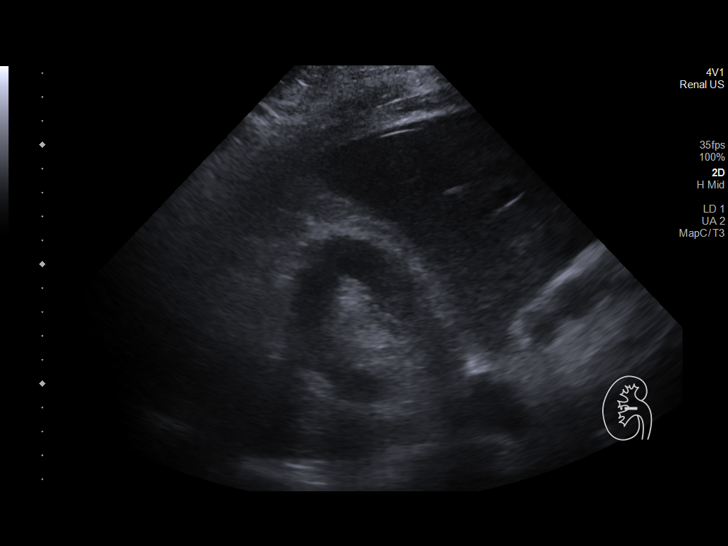
[im 17/49]
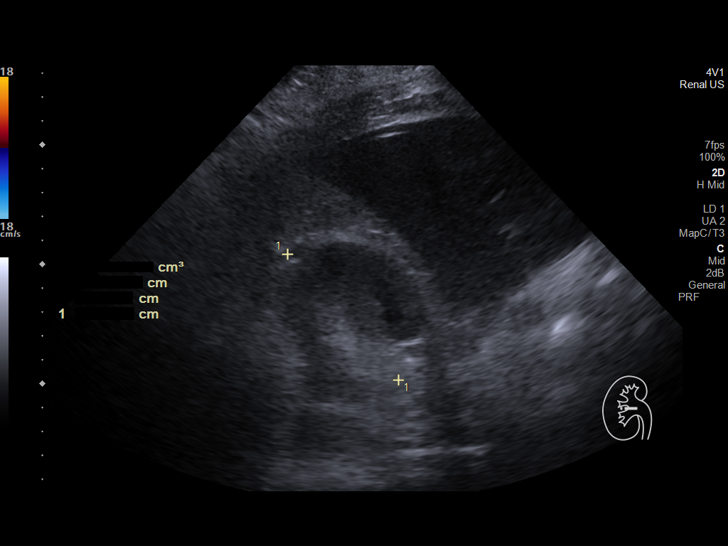
[im 19/49]
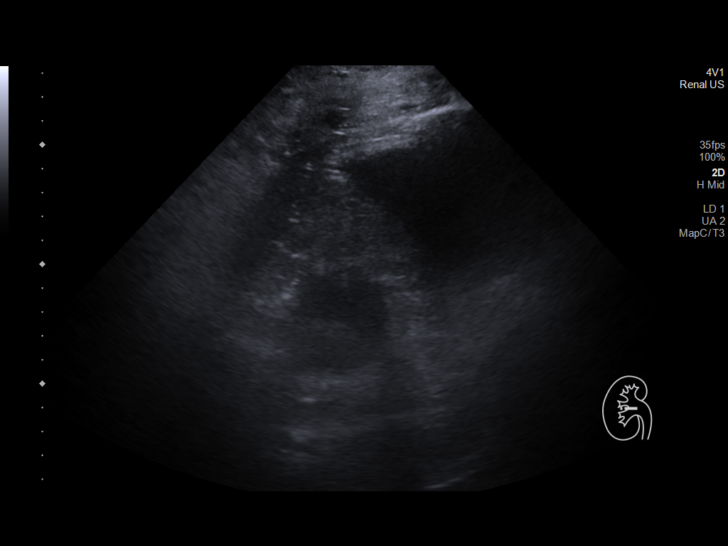
[im 23/49]
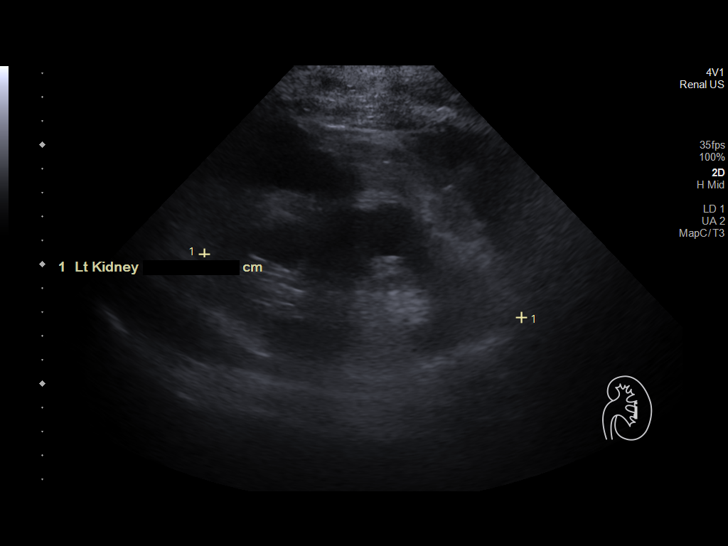
[im 27/49]
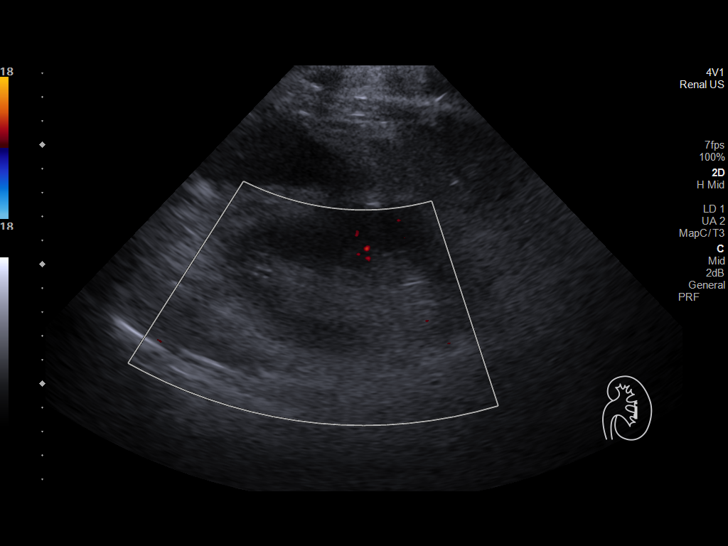
[im 31/49]
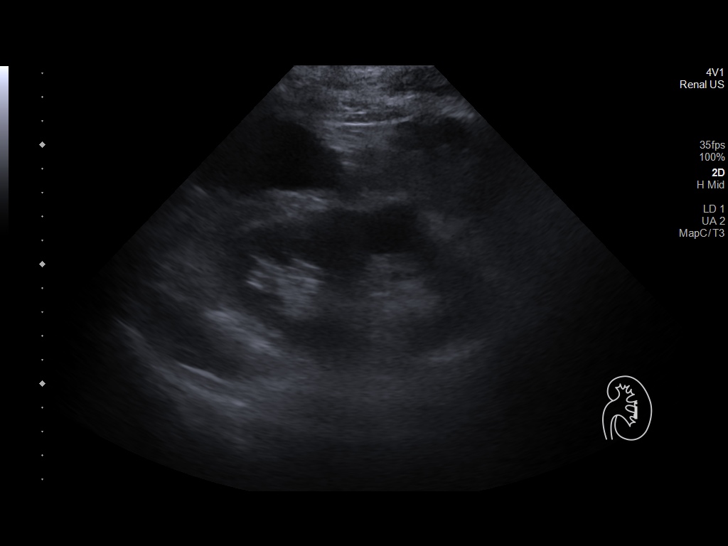
[im 33/49]
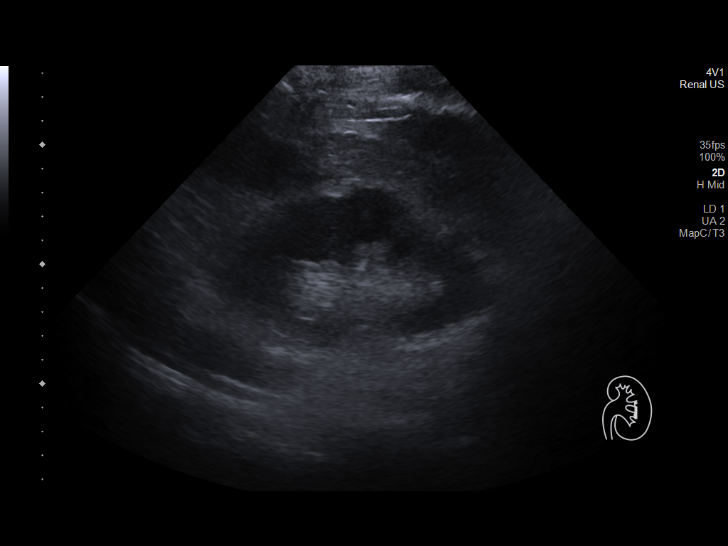
[im 37/49]
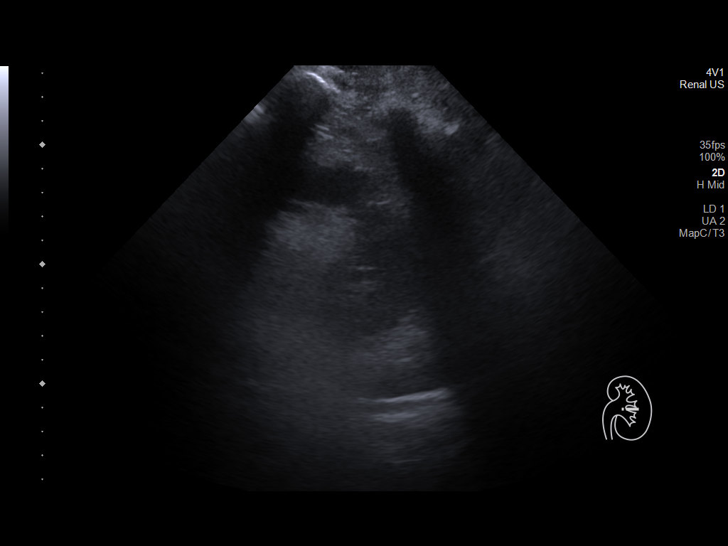
[im 41/49]
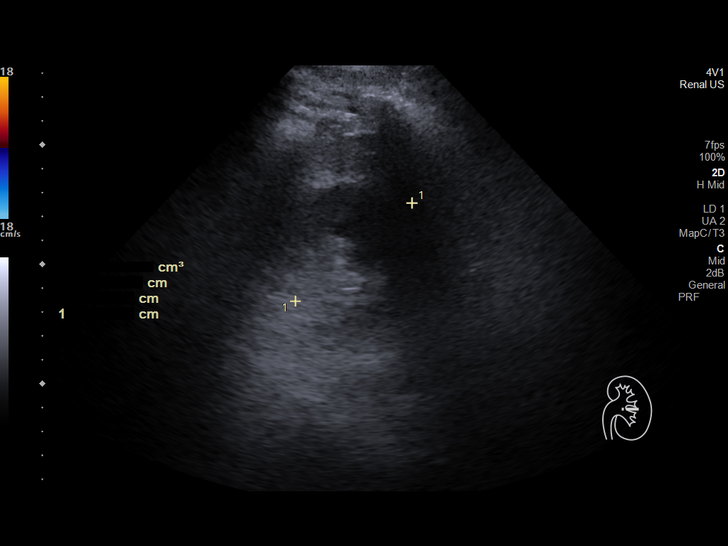
[im 45/49]
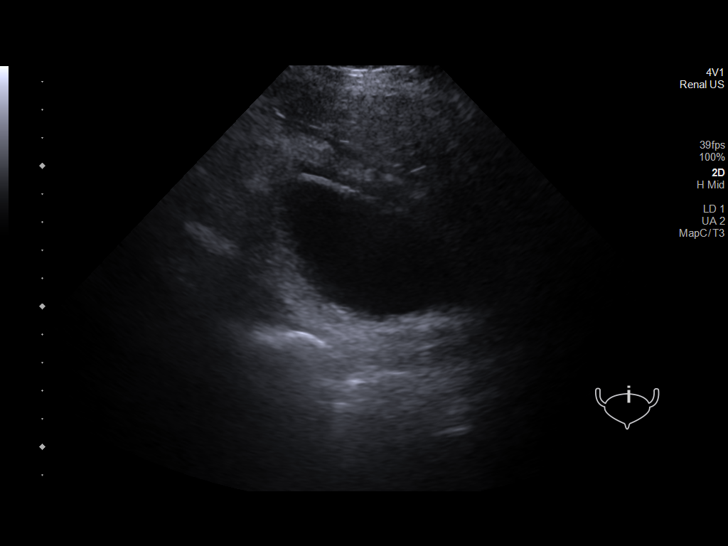
[im 49/49]
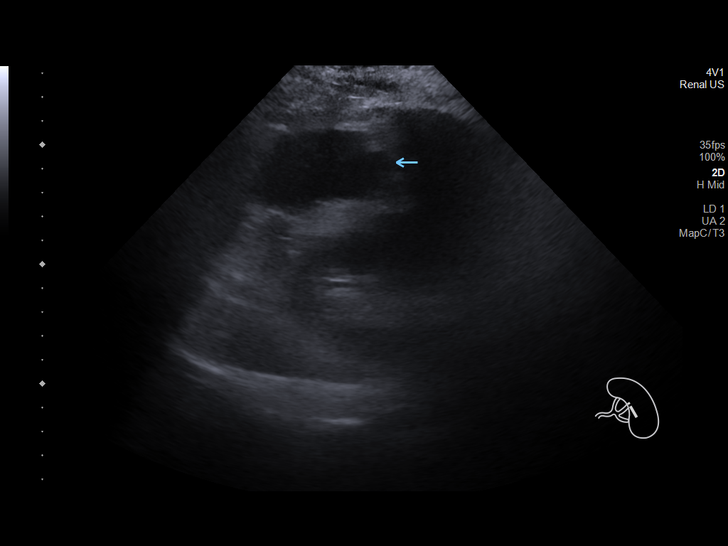

[14 of 25 positions shown; findings below may reference images not displayed]

FINDINGS: Right Kidney:

Renal measurements: 12.0 x 7.0 x 6.8 cm = volume: 300 mL .
Echogenicity within normal limits. No mass or hydronephrosis
visualized.

Left Kidney:

Renal measurements: 12.9 x 6.9 x 6.4 cm = volume: 296 mL.
Echogenicity within normal limits. No mass or hydronephrosis
visualized.

Bladder:

Appears normal for degree of bladder distention. Ureteral jets are
not visualized.

Other:

None.
IMPRESSION: No significant renal abnormality is noted.
